# Patient Record
Sex: Male | Born: 1945 | Race: Black or African American | Hispanic: No | Marital: Single | Smoking: Former smoker
Health system: Southern US, Community
[De-identification: ages and names within clinical notes are randomized; demographics above are authoritative.]

## PROBLEM LIST (undated history)

## (undated) DIAGNOSIS — I2510 Atherosclerotic heart disease of native coronary artery without angina pectoris: Secondary | ICD-10-CM

## (undated) DIAGNOSIS — E785 Hyperlipidemia, unspecified: Secondary | ICD-10-CM

## (undated) DIAGNOSIS — C61 Malignant neoplasm of prostate: Secondary | ICD-10-CM

## (undated) DIAGNOSIS — D649 Anemia, unspecified: Secondary | ICD-10-CM

## (undated) DIAGNOSIS — I10 Essential (primary) hypertension: Secondary | ICD-10-CM

## (undated) DIAGNOSIS — I5042 Chronic combined systolic (congestive) and diastolic (congestive) heart failure: Secondary | ICD-10-CM

## (undated) DIAGNOSIS — R569 Unspecified convulsions: Secondary | ICD-10-CM

## (undated) DIAGNOSIS — C801 Malignant (primary) neoplasm, unspecified: Secondary | ICD-10-CM

## (undated) DIAGNOSIS — F0390 Unspecified dementia without behavioral disturbance: Secondary | ICD-10-CM

## (undated) HISTORY — PX: OTHER SURGICAL HISTORY: SHX169

---

## 2004-06-28 ENCOUNTER — Emergency Department: Payer: Self-pay | Admitting: Emergency Medicine

## 2007-03-03 ENCOUNTER — Other Ambulatory Visit: Payer: Self-pay

## 2007-03-03 ENCOUNTER — Emergency Department: Payer: Self-pay | Admitting: Emergency Medicine

## 2013-06-16 ENCOUNTER — Emergency Department: Payer: Self-pay | Admitting: Emergency Medicine

## 2013-06-16 LAB — BASIC METABOLIC PANEL
Anion Gap: 9 (ref 7–16)
BUN: 10 mg/dL (ref 7–18)
CALCIUM: 8.8 mg/dL (ref 8.5–10.1)
CHLORIDE: 108 mmol/L — AB (ref 98–107)
CREATININE: 1.1 mg/dL (ref 0.60–1.30)
Co2: 22 mmol/L (ref 21–32)
Glucose: 109 mg/dL — ABNORMAL HIGH (ref 65–99)
Osmolality: 277 (ref 275–301)
Potassium: 3.9 mmol/L (ref 3.5–5.1)
Sodium: 139 mmol/L (ref 136–145)

## 2013-06-16 LAB — CBC
HCT: 41.8 % (ref 40.0–52.0)
HGB: 13.5 g/dL (ref 13.0–18.0)
MCH: 28.9 pg (ref 26.0–34.0)
MCHC: 32.4 g/dL (ref 32.0–36.0)
MCV: 90 fL (ref 80–100)
Platelet: 167 10*3/uL (ref 150–440)
RBC: 4.67 10*6/uL (ref 4.40–5.90)
RDW: 16.5 % — AB (ref 11.5–14.5)
WBC: 4.8 10*3/uL (ref 3.8–10.6)

## 2013-06-16 LAB — ETHANOL
Ethanol %: <0.003 % (ref 0.000–0.080)
Ethanol: <3 mg/dL

## 2013-06-29 ENCOUNTER — Emergency Department: Payer: Self-pay | Admitting: Emergency Medicine

## 2013-06-29 LAB — URINALYSIS, COMPLETE
BILIRUBIN, UR: NEGATIVE
Bacteria: NONE SEEN
Glucose,UR: NEGATIVE mg/dL (ref 0–75)
Ketone: NEGATIVE
Leukocyte Esterase: NEGATIVE
NITRITE: NEGATIVE
PROTEIN: NEGATIVE
Ph: 5 (ref 4.5–8.0)
Specific Gravity: 1.01 (ref 1.003–1.030)
Squamous Epithelial: <1
WBC UR: 1 /HPF (ref 0–5)

## 2013-06-29 LAB — BASIC METABOLIC PANEL
ANION GAP: 6 — AB (ref 7–16)
BUN: 7 mg/dL (ref 7–18)
CREATININE: 1.06 mg/dL (ref 0.60–1.30)
Calcium, Total: 9.1 mg/dL (ref 8.5–10.1)
Chloride: 107 mmol/L (ref 98–107)
Co2: 28 mmol/L (ref 21–32)
EGFR (African American): >60
Glucose: 105 mg/dL — ABNORMAL HIGH (ref 65–99)
Osmolality: 280 (ref 275–301)
Potassium: 3.8 mmol/L (ref 3.5–5.1)
Sodium: 141 mmol/L (ref 136–145)

## 2013-06-29 LAB — CBC
HCT: 41.6 % (ref 40.0–52.0)
HGB: 13.7 g/dL (ref 13.0–18.0)
MCH: 29.3 pg (ref 26.0–34.0)
MCHC: 33 g/dL (ref 32.0–36.0)
MCV: 89 fL (ref 80–100)
PLATELETS: 254 10*3/uL (ref 150–440)
RBC: 4.68 10*6/uL (ref 4.40–5.90)
RDW: 16.1 % — AB (ref 11.5–14.5)
WBC: 10 10*3/uL (ref 3.8–10.6)

## 2013-06-30 LAB — DRUG SCREEN, URINE

## 2013-06-30 LAB — ETHANOL: Ethanol %: <0.003 % (ref 0.000–0.080)

## 2013-06-30 LAB — CK: CK, Total: 522 U/L — ABNORMAL HIGH

## 2013-06-30 LAB — TROPONIN I: Troponin-I: <0.02 ng/mL

## 2013-07-28 ENCOUNTER — Emergency Department: Payer: Self-pay | Admitting: Emergency Medicine

## 2013-07-28 LAB — URINALYSIS, COMPLETE
BILIRUBIN, UR: NEGATIVE
Bacteria: NONE SEEN
Blood: NEGATIVE
Glucose,UR: NEGATIVE mg/dL (ref 0–75)
Ketone: NEGATIVE
LEUKOCYTE ESTERASE: NEGATIVE
Nitrite: NEGATIVE
Ph: 5 (ref 4.5–8.0)
Protein: 30
Specific Gravity: 1.014 (ref 1.003–1.030)
Squamous Epithelial: NONE SEEN

## 2013-07-28 LAB — CBC WITH DIFFERENTIAL/PLATELET
Basophil #: 0 10*3/uL (ref 0.0–0.1)
Basophil %: 0.8 %
Eosinophil #: 0.1 10*3/uL (ref 0.0–0.7)
Eosinophil %: 2 %
HCT: 42.2 % (ref 40.0–52.0)
HGB: 14.2 g/dL (ref 13.0–18.0)
LYMPHS PCT: 25.1 %
Lymphocyte #: 1.3 10*3/uL (ref 1.0–3.6)
MCH: 30.1 pg (ref 26.0–34.0)
MCHC: 33.6 g/dL (ref 32.0–36.0)
MCV: 90 fL (ref 80–100)
Monocyte #: 0.5 x10 3/mm (ref 0.2–1.0)
Monocyte %: 9 %
NEUTROS PCT: 63.1 %
Neutrophil #: 3.2 10*3/uL (ref 1.4–6.5)
Platelet: 192 10*3/uL (ref 150–440)
RBC: 4.71 10*6/uL (ref 4.40–5.90)
RDW: 16.5 % — AB (ref 11.5–14.5)
WBC: 5.1 10*3/uL (ref 3.8–10.6)

## 2013-07-28 LAB — BASIC METABOLIC PANEL
Anion Gap: 6 — ABNORMAL LOW (ref 7–16)
BUN: 14 mg/dL (ref 7–18)
CREATININE: 1.05 mg/dL (ref 0.60–1.30)
Calcium, Total: 9.1 mg/dL (ref 8.5–10.1)
Chloride: 109 mmol/L — ABNORMAL HIGH (ref 98–107)
Co2: 25 mmol/L (ref 21–32)
EGFR (Non-African Amer.): >60
Glucose: 98 mg/dL (ref 65–99)
OSMOLALITY: 280 (ref 275–301)
Potassium: 3.9 mmol/L (ref 3.5–5.1)
Sodium: 140 mmol/L (ref 136–145)

## 2013-07-28 LAB — TROPONIN I

## 2013-07-28 LAB — PHENYTOIN LEVEL, TOTAL: Dilantin: 9.6 ug/mL — ABNORMAL LOW (ref 10.0–20.0)

## 2013-08-14 ENCOUNTER — Emergency Department: Payer: Self-pay | Admitting: Emergency Medicine

## 2013-08-14 LAB — URINALYSIS, COMPLETE
Bacteria: NONE SEEN
Bilirubin,UR: NEGATIVE
GLUCOSE, UR: NEGATIVE mg/dL (ref 0–75)
KETONE: NEGATIVE
Leukocyte Esterase: NEGATIVE
NITRITE: NEGATIVE
Ph: 5 (ref 4.5–8.0)
SPECIFIC GRAVITY: 1.01 (ref 1.003–1.030)
Squamous Epithelial: NONE SEEN

## 2013-08-14 LAB — CBC
HCT: 39.4 % — ABNORMAL LOW (ref 40.0–52.0)
HGB: 12.9 g/dL — ABNORMAL LOW (ref 13.0–18.0)
MCH: 29.9 pg (ref 26.0–34.0)
MCHC: 32.7 g/dL (ref 32.0–36.0)
MCV: 91 fL (ref 80–100)
Platelet: 180 10*3/uL (ref 150–440)
RBC: 4.32 10*6/uL — ABNORMAL LOW (ref 4.40–5.90)
RDW: 15.8 % — ABNORMAL HIGH (ref 11.5–14.5)
WBC: 5 10*3/uL (ref 3.8–10.6)

## 2013-08-14 LAB — BASIC METABOLIC PANEL
Anion Gap: 9 (ref 7–16)
BUN: 10 mg/dL (ref 7–18)
CALCIUM: 8.2 mg/dL — AB (ref 8.5–10.1)
Chloride: 104 mmol/L (ref 98–107)
Co2: 24 mmol/L (ref 21–32)
Creatinine: 1.4 mg/dL — ABNORMAL HIGH (ref 0.60–1.30)
EGFR (Non-African Amer.): 52 — ABNORMAL LOW
GFR CALC AF AMER: 60 — AB
GLUCOSE: 110 mg/dL — AB (ref 65–99)
Osmolality: 274 (ref 275–301)
POTASSIUM: 3.8 mmol/L (ref 3.5–5.1)
Sodium: 137 mmol/L (ref 136–145)

## 2013-08-14 LAB — PHENYTOIN LEVEL, TOTAL: Dilantin: 1.6 ug/mL — ABNORMAL LOW (ref 10.0–20.0)

## 2013-08-15 LAB — DRUG SCREEN, URINE

## 2013-08-19 ENCOUNTER — Emergency Department: Payer: Self-pay | Admitting: Emergency Medicine

## 2013-08-29 DIAGNOSIS — R200 Anesthesia of skin: Secondary | ICD-10-CM | POA: Insufficient documentation

## 2013-08-29 DIAGNOSIS — G44229 Chronic tension-type headache, not intractable: Secondary | ICD-10-CM | POA: Insufficient documentation

## 2013-08-29 DIAGNOSIS — R413 Other amnesia: Secondary | ICD-10-CM | POA: Insufficient documentation

## 2013-12-19 DIAGNOSIS — F1490 Cocaine use, unspecified, uncomplicated: Secondary | ICD-10-CM | POA: Insufficient documentation

## 2014-04-04 ENCOUNTER — Emergency Department: Payer: Self-pay | Admitting: Emergency Medicine

## 2014-04-04 DIAGNOSIS — F29 Unspecified psychosis not due to a substance or known physiological condition: Secondary | ICD-10-CM | POA: Diagnosis not present

## 2014-04-04 DIAGNOSIS — F05 Delirium due to known physiological condition: Secondary | ICD-10-CM | POA: Diagnosis not present

## 2014-04-04 DIAGNOSIS — G3184 Mild cognitive impairment, so stated: Secondary | ICD-10-CM | POA: Diagnosis not present

## 2014-04-04 DIAGNOSIS — F419 Anxiety disorder, unspecified: Secondary | ICD-10-CM | POA: Diagnosis not present

## 2014-04-04 DIAGNOSIS — Z79899 Other long term (current) drug therapy: Secondary | ICD-10-CM | POA: Diagnosis not present

## 2014-04-04 DIAGNOSIS — F23 Brief psychotic disorder: Secondary | ICD-10-CM | POA: Diagnosis not present

## 2014-04-04 DIAGNOSIS — S90511A Abrasion, right ankle, initial encounter: Secondary | ICD-10-CM | POA: Diagnosis not present

## 2014-04-12 DIAGNOSIS — F1490 Cocaine use, unspecified, uncomplicated: Secondary | ICD-10-CM | POA: Diagnosis not present

## 2014-04-12 DIAGNOSIS — R569 Unspecified convulsions: Secondary | ICD-10-CM | POA: Diagnosis not present

## 2014-04-12 DIAGNOSIS — R51 Headache: Secondary | ICD-10-CM | POA: Diagnosis not present

## 2014-04-12 DIAGNOSIS — R200 Anesthesia of skin: Secondary | ICD-10-CM | POA: Diagnosis not present

## 2014-06-18 NOTE — Consult Note (Signed)
Psychiatry: On follow-up evaluation the patient is now consistently awake.  His brother visited earlier today and reports that according to nursing this is his baseline mental state.  Family had been concerned about some substance abuse issues.  Patient remains confused.  No evidence of suicidality or homicidality or psychosis.  Most likely suffering long-term cognitive damage from substance abuse or the postictal effects from a seizure.  I have ordered a level of his Keppra which is not back yet.  Restarted his Keppra and discussed with the patient the importance of med compliance.  Patient no longer meets commitment criteria and can be released from the emergency room.  follow with Dr. Doy Hutching.  Electronic Signatures: Tommi Crepeau, Madie Reno (MD)  (Signed on 16-Feb-16 18:33)  Authored  Last Updated: 16-Feb-16 18:33 by Gonzella Lex (MD)

## 2014-06-18 NOTE — Consult Note (Signed)
PATIENT NAME:  Jeremy [name], Jeremy [name] MR#:  [id number] DATE OF BIRTH:  1945/02/25  DATE OF CONSULTATION:  04/04/2014  REFERRING PHYSICIAN:   CONSULTING PHYSICIAN:  Gonzella Lex, MD  IDENTIFYING INFORMATION AND REASON FOR CONSULTATION: A 69 year old man with apparently a past history of seizures who was brought in after being picked up wandering, acting confused, and bizarre in public. The patient has no real chief complaint.   HISTORY OF PRESENT ILLNESS: Information obtained from the patient and the chart. We are told that the patient was found wandering outside and appeared to be confused and incoherent. The psychiatry nursing assessment found him to be rather uncommunicative and could not add much more, thought that he was nodding back and forth and not responding much. We are told that there has been some communication with his brother who said that he was looking for him, but I have not been able to get any clear information than that. The patient is still a very poor historian. He mentions walking outside, but tells me he thinks he was just walking for a couple of hours. He knows that he is supposed to be taking medication. It is unclear whether he has been compliant with it recently. The patient is not reporting any acute mood symptoms. He is not reporting any hallucinations, not reporting any suicidal or homicidal ideation. He is a little hard to pin down about drinking. He mentions at one point that he had had part or most of a 40 ounce beer, but sounds like it had been at least days ago. Denies that he is abusing other drugs currently, although I am unclear as to whether he was answering positively about marijuana use.   PAST PSYCHIATRIC HISTORY: We are still tracking down information. I left a message for his brother to call me back. We have his medicines from his pharmacy, which are only Keppra. We have documentation that he came to the Emergency Room several times earlier in 2015, either for  seizures or for medication refill. No known mental health history. The patient denies ever having been in a psychiatric hospital or having any history of suicidality.   FAMILY HISTORY: He mentions that there is a positive family history of mental illness.   SOCIAL HISTORY: Evidently lives alone, but says that his brother lives very close by, maybe only 1 or 2 houses away. The patient says that he is not married and that he has no children. Cannot give much more information.   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   CURRENT MEDICATIONS: Keppra 250 mg in the morning, 500 mg at night.   ALLERGIES: No known drug allergies.   REVIEW OF SYSTEMS: The patient is not complaining of any specific physical concerns right now. Does not complain of pain or feeling sick. Denies having any hallucinations. Denies suicidality.   MENTAL STATUS EXAMINATION: A somewhat disheveled, thin for his height gentleman who looks his stated age. He is cooperative as much as he can be with his confused mental state. Eye contact intermittent. Psychomotor activity marked by slowness and being a little off balance and seeming confused. He had a great deal of trouble getting his trousers to stay up and was often preoccupied with that. Speech was decreased in total amount, slightly hard to understand, slow. Affect blunted. Mood stated as being okay. Thoughts are disorganized. He can start to answer some questions and then will veer off  on a tangent that does not seem to make much sense. On other occasions he does not even start to give an appropriate answer to questions. Seems to be occasionally responding to internal stimuli, but not necessarily to voices. Denies auditory or visual hallucinations currently, but he seems to indicate that he may have seen some odd things or heard some odd things in the recent past. Denies suicidal or homicidal ideation. The patient can  repeat 3 words immediately, remembers zero of them at three minutes. He is oriented to the month, but not the year. He was eventually able to guess that he is in [place], but otherwise disoriented to place and situation. Cognitive function appears to be impaired. Judgment and insight presumably impaired.   LABORATORY RESULTS: Drug screen positive for benzodiazepines. After investigating it that appears to be an artifact of him being given medication when he came in to the Emergency Room. TSH normal. Alcohol level negative. Chemistry slightly low potassium 3.1, slightly elevated glucose 139, everything else unremarkable. Hematology panel unremarkable. Salicylates and acetaminophen negative.   VITAL SIGNS: His blood pressure is running high with the most recent one being 143/115, respirations 18, pulse 104, temperature 97.8.   ASSESSMENT: This is a 69 year old man who is apparently confused and agitated when he was first brought into the Emergency Room. He was rapidly given forced medications which probably kept him sedated for a little while, but now he seems to be coming out of that. He appears to be confused and probably having some delirium right now. It is not clear if this is on top of dementia or not. It is not clear at this point what it could be from. Certainly could be from recent seizures, especially partial seizures, which could explain odd behavior when he came in. No evidence here that I can see of acute substance abuse. Cannot rule out delirium tremens, but do not have any definite proof of that. The patient needs further evaluation to determine appropriate treatment.   TREATMENT PLAN: I have ordered a Keppra level to be drawn stat just to see if we can find out whether he might have been taking his medication appropriately. I have put in a phone call to his brother to get collateral information. Once we get the [place] level drawn we can give him his usual dose of his medication. At this  point, no indication for psychiatric hospitalization.   DIAGNOSIS, PRINCIPAL AND PRIMARY:  AXIS I: Delirium, unknown etiology.   SECONDARY DIAGNOSES: AXIS I: Rule out dementia.  AXIS II: No diagnosis.  AXIS III: Seizure disorder.  ____________________________ Gonzella Lex, MD jtc:sb D: 04/04/2014 10:25:55 ET T: 04/04/2014 10:42:35 ET JOB#: [id number]  cc: Gonzella Lex, MD, <Dictator> Gonzella Lex MD ELECTRONICALLY SIGNED 04/25/2014 10:33

## 2014-12-30 ENCOUNTER — Emergency Department: Payer: Commercial Managed Care - HMO

## 2014-12-30 ENCOUNTER — Emergency Department
Admission: EM | Admit: 2014-12-30 | Discharge: 2014-12-30 | Disposition: A | Discharge status: Home / Self Care | Payer: Commercial Managed Care - HMO | Attending: Emergency Medicine | Admitting: Emergency Medicine

## 2014-12-30 ENCOUNTER — Encounter: Payer: Self-pay | Admitting: Emergency Medicine

## 2014-12-30 DIAGNOSIS — Z87891 Personal history of nicotine dependence: Secondary | ICD-10-CM | POA: Diagnosis not present

## 2014-12-30 DIAGNOSIS — Z79899 Other long term (current) drug therapy: Secondary | ICD-10-CM | POA: Diagnosis not present

## 2014-12-30 DIAGNOSIS — R030 Elevated blood-pressure reading, without diagnosis of hypertension: Secondary | ICD-10-CM | POA: Diagnosis present

## 2014-12-30 DIAGNOSIS — I10 Essential (primary) hypertension: Secondary | ICD-10-CM | POA: Diagnosis not present

## 2014-12-30 DIAGNOSIS — R202 Paresthesia of skin: Secondary | ICD-10-CM | POA: Insufficient documentation

## 2014-12-30 HISTORY — DX: Unspecified convulsions: R56.9

## 2014-12-30 LAB — COMPREHENSIVE METABOLIC PANEL
ALBUMIN: 4.4 g/dL (ref 3.5–5.0)
ALT: 8 U/L — AB (ref 17–63)
AST: 19 U/L (ref 15–41)
Alkaline Phosphatase: 50 U/L (ref 38–126)
Anion gap: 7 (ref 5–15)
BUN: 11 mg/dL (ref 6–20)
CHLORIDE: 105 mmol/L (ref 101–111)
CO2: 26 mmol/L (ref 22–32)
Calcium: 9.3 mg/dL (ref 8.9–10.3)
Creatinine, Ser: 1.02 mg/dL (ref 0.61–1.24)
GFR calc Af Amer: >60 mL/min (ref >60)
GLUCOSE: 90 mg/dL (ref 65–99)
POTASSIUM: 3.7 mmol/L (ref 3.5–5.1)
Sodium: 138 mmol/L (ref 135–145)
Total Bilirubin: 0.3 mg/dL (ref 0.3–1.2)
Total Protein: 7.9 g/dL (ref 6.5–8.1)

## 2014-12-30 LAB — CBC
HEMATOCRIT: 39.1 % — AB (ref 40.0–52.0)
Hemoglobin: 13.3 g/dL (ref 13.0–18.0)
MCH: 30.4 pg (ref 26.0–34.0)
MCHC: 34 g/dL (ref 32.0–36.0)
MCV: 89.5 fL (ref 80.0–100.0)
PLATELETS: 199 10*3/uL (ref 150–440)
RBC: 4.37 MIL/uL — AB (ref 4.40–5.90)
RDW: 14.5 % (ref 11.5–14.5)
WBC: 5 10*3/uL (ref 3.8–10.6)

## 2014-12-30 LAB — TROPONIN I: Troponin I: <0.03 ng/mL (ref <0.031)

## 2014-12-30 NOTE — ED Provider Notes (Addendum)
[facility] Emergency Department Provider Note  Time seen: 7:09 PM  I have reviewed the triage vital signs and the nursing notes.   HISTORY  Chief Complaint Hypertension    HPI Jeremy [name] is a 69 y.o. male with a past medical history of a seizure disorder presents the emergency department elevated blood pressure and intermittent right arm and leg numbness. According to the patient for the past 2 weeks he has had intermittent numbness and tingling in his right arm and leg. He states it'll resolve completely at times and sometimes for several days. He states when the numbness tingling occurs it occurs for seconds or minutes and then resolves. States he's never had issues like this in the past. He has a seizure disorder for which he takes Keppra and follows up with Dr. Gurney Maxin. Denies any headache, confusion, slurred speech. Denies any weakness or trouble walking. Denies any current numbness or tingling.     Past Medical History  Diagnosis Date  . Seizures ([place])     There are no active problems to display for this patient.   History reviewed. No pertinent past surgical history.  Current Outpatient Rx  Name  Route  Sig  Dispense  Refill  . levETIRAcetam (KEPPRA) 500 MG tablet   Oral   Take 500 mg by mouth every morning.         . levETIRAcetam (KEPPRA) 750 MG tablet   Oral   Take 750 mg by mouth once.           Allergies Review of patient's allergies indicates no known allergies.  History reviewed. No pertinent family history.  Social History Social History  Substance Use Topics  . Smoking status: Former Smoker -- 25 years  . Smokeless tobacco: None  . Alcohol Use: No     Comment: quit etoh x 3 months ago    Review of Systems Constitutional: Negative for fever. Cardiovascular: Negative for chest pain. Respiratory: Negative for shortness of breath. Gastrointestinal: Negative for abdominal pain Musculoskeletal:  Negative for back pain. Neurological: Occasional tingling and numbness in the right arm and leg. Denies headache. 10-point ROS otherwise negative.  ____________________________________________   PHYSICAL EXAM:  VITAL SIGNS: ED Triage Vitals  Enc Vitals Group     BP 12/30/14 1810 193/85 mmHg     Pulse Rate 12/30/14 1810 54     Resp 12/30/14 1810 18     Temp 12/30/14 1810 98.2 F (36.8 C)     Temp src --      SpO2 12/30/14 1810 98 %     Weight 12/30/14 1810 155 lb (70.308 kg)     Height 12/30/14 1810 6' (1.829 m)     Head Cir --      Peak Flow --      Pain Score --      Pain Loc --      Pain Edu? --      Excl. in [place]? --    Constitutional: Alert and oriented. Well appearing and in no distress. Eyes: Normal exam ENT   Head: Normocephalic and atraumatic.   Mouth/Throat: Mucous membranes are moist. Cardiovascular: Normal rate, regular rhythm. No murmur Respiratory: Normal respiratory effort without tachypnea nor retractions. Breath sounds are clear and equal bilaterally. No wheezes/rales/rhonchi. Gastrointestinal: Soft and nontender. No distention.  Musculoskeletal: Nontender with normal range of motion in all extremities.  Neurologic:  Normal speech and language. No gross focal neurologic deficits. Equal grip strengths bilaterally. No cranial nerve  deficits identified. No pronator drift on exam. 2 mm PERRL eye exam. Skin:  Skin is warm, dry and intact.  Psychiatric: Mood and affect are normal. Speech and behavior are normal.  ____________________________________________    EKG  EKG reviewed and interpreted by myself shows sinus bradycardia at 52 bpm, narrow QRS, normal axis, normal intervals, no ST changes noted. Overall normal EKG.  ____________________________________________    RADIOLOGY  CT head shows no acute abnormality.  ____________________________________________    INITIAL IMPRESSION / ASSESSMENT AND PLAN / ED COURSE  Pertinent labs & imaging  results that were available during my care of the patient were reviewed by me and considered in my medical decision making (see chart for details).  CT negative. Labs are largely within normal limits. Patient has a good neurologic exam currently. No deficits identified. Patient states symptoms are intermittent and when they do occur they occur for seconds to minutes and then resolved. Sounds inconsistent with CVA. The patient sees Dr. Melrose Nakayama for seizure disorder. I recommended that the patient follow-up with Dr. Melrose Nakayama for this issue to consider further imaging such as an MRI. Patient is agreeable mode discharged home.  CT shows no acute changes. Labs are within normal limits. I discussed with the patient follow-up with his neurologist for further workup here today also discussed strict CVA return precautions, patient is agreeable.  ____________________________________________   FINAL CLINICAL IMPRESSION(S) / ED DIAGNOSES  Paresthesias   Harvest Dark, MD 12/30/14 1912  Harvest Dark, MD 12/30/14 2000

## 2014-12-30 NOTE — Discharge Instructions (Signed)
Please call Dr. Melrose Nakayama at the number above to arrange a follow-up appointment as soon as possible to discuss her symptoms further. Return to the emergency department for any worsening weakness or numbness symptoms, headache, confusion or slurred speech.     Paresthesia Paresthesia is a burning or prickling feeling. This feeling can happen in any part of the body. It often happens in the hands, arms, legs, or feet. Usually, it is not painful. In most cases, the feeling goes away in a short time and is not a sign of a serious problem. HOME CARE  Avoid drinking alcohol.  Try massage or needle therapy (acupuncture) to help with your problems.  Keep all follow-up visits as told by your doctor. This is important. GET HELP IF:  You keep on having episodes of paresthesia.  Your burning or prickling feeling gets worse when you walk.  You have pain or cramps.  You feel dizzy.  You have a rash. GET HELP RIGHT AWAY IF:  You feel weak.  You have trouble walking or moving.  You have problems speaking, understanding, or seeing.  You feel confused.  You cannot control when you pee (urinate) or poop (bowel movement).  You lose feeling (numbness) after an injury.  You pass out (faint).   This information is not intended to replace advice given to you by your health care provider. Make sure you discuss any questions you have with your health care provider.   Document Released: 01/17/2008 Document Revised: 06/20/2014 Document Reviewed: 01/30/2014 Elsevier Interactive Patient Education 2016 [employer].  Hypertension Hypertension, commonly called high blood pressure, is when the force of blood pumping through your arteries is too strong. Your arteries are the blood vessels that carry blood from your heart throughout your body. A blood pressure reading consists of a higher number over a lower number, such as 110/72. The higher number (systolic) is the pressure inside your arteries when  your heart pumps. The lower number (diastolic) is the pressure inside your arteries when your heart relaxes. Ideally you want your blood pressure below 120/80. Hypertension forces your heart to work harder to pump blood. Your arteries may become narrow or stiff. Having untreated or uncontrolled hypertension can cause heart attack, stroke, kidney disease, and other problems. RISK FACTORS Some risk factors for high blood pressure are controllable. Others are not.  Risk factors you cannot control include:   Race. You may be at higher risk if you are African American.  Age. Risk increases with age.  Gender. Men are at higher risk than women before age 66 years. After age 34, women are at higher risk than men. Risk factors you can control include:  Not getting enough exercise or physical activity.  Being overweight.  Getting too much fat, sugar, calories, or salt in your diet.  Drinking too much alcohol. SIGNS AND SYMPTOMS Hypertension does not usually cause signs or symptoms. Extremely high blood pressure (hypertensive crisis) may cause headache, anxiety, shortness of breath, and nosebleed. DIAGNOSIS To check if you have hypertension, your health care provider will measure your blood pressure while you are seated, with your arm held at the level of your heart. It should be measured at least twice using the same arm. Certain conditions can cause a difference in blood pressure between your right and left arms. A blood pressure reading that is higher than normal on one occasion does not mean that you need treatment. If it is not clear whether you have high blood pressure, you may be  asked to return on a different day to have your blood pressure checked again. Or, you may be asked to monitor your blood pressure at home for 1 or more weeks. TREATMENT Treating high blood pressure includes making lifestyle changes and possibly taking medicine. Living a healthy lifestyle can help lower high blood  pressure. You may need to change some of your habits. Lifestyle changes may include:  Following the DASH diet. This diet is high in fruits, vegetables, and whole grains. It is low in salt, red meat, and added sugars.  Keep your sodium intake below 2,300 mg per day.  Getting at least 30-45 minutes of aerobic exercise at least 4 times per week.  Losing weight if necessary.  Not smoking.  Limiting alcoholic beverages.  Learning ways to reduce stress. Your health care provider may prescribe medicine if lifestyle changes are not enough to get your blood pressure under control, and if one of the following is true:  You are 32-15 years of age and your systolic blood pressure is above 140.  You are 56 years of age or older, and your systolic blood pressure is above 150.  Your diastolic blood pressure is above 90.  You have diabetes, and your systolic blood pressure is over [id number] or your diastolic blood pressure is over 90.  You have kidney disease and your blood pressure is above 140/90.  You have heart disease and your blood pressure is above 140/90. Your personal target blood pressure may vary depending on your medical conditions, your age, and other factors. HOME CARE INSTRUCTIONS  Have your blood pressure rechecked as directed by your health care provider.   Take medicines only as directed by your health care provider. Follow the directions carefully. Blood pressure medicines must be taken as prescribed. The medicine does not work as well when you skip doses. Skipping doses also puts you at risk for problems.  Do not smoke.   Monitor your blood pressure at home as directed by your health care provider. SEEK MEDICAL CARE IF:   You think you are having a reaction to medicines taken.  You have recurrent headaches or feel dizzy.  You have swelling in your ankles.  You have trouble with your vision. SEEK IMMEDIATE MEDICAL CARE IF:  You develop a severe headache or  confusion.  You have unusual weakness, numbness, or feel faint.  You have severe chest or abdominal pain.  You vomit repeatedly.  You have trouble breathing. MAKE SURE YOU:   Understand these instructions.  Will watch your condition.  Will get help right away if you are not doing well or get worse.   This information is not intended to replace advice given to you by your health care provider. Make sure you discuss any questions you have with your health care provider.   Document Released: 02/03/2005 Document Revised: 06/20/2014 Document Reviewed: 11/26/2012 Elsevier Interactive Patient Education [employer].

## 2014-12-30 NOTE — ED Notes (Signed)
Pt states rt arm and leg numbness off and on x 1 week specifically in bicep, forearm and calf. Denies hx of htn

## 2015-01-13 ENCOUNTER — Emergency Department
Admission: EM | Admit: 2015-01-13 | Discharge: 2015-01-13 | Disposition: A | Discharge status: Home / Self Care | Payer: Commercial Managed Care - HMO | Attending: Emergency Medicine | Admitting: Emergency Medicine

## 2015-01-13 ENCOUNTER — Emergency Department: Payer: Commercial Managed Care - HMO

## 2015-01-13 DIAGNOSIS — R202 Paresthesia of skin: Secondary | ICD-10-CM | POA: Diagnosis not present

## 2015-01-13 DIAGNOSIS — Z79899 Other long term (current) drug therapy: Secondary | ICD-10-CM | POA: Insufficient documentation

## 2015-01-13 DIAGNOSIS — Z87891 Personal history of nicotine dependence: Secondary | ICD-10-CM | POA: Insufficient documentation

## 2015-01-13 DIAGNOSIS — I10 Essential (primary) hypertension: Secondary | ICD-10-CM | POA: Diagnosis not present

## 2015-01-13 DIAGNOSIS — R55 Syncope and collapse: Secondary | ICD-10-CM | POA: Diagnosis not present

## 2015-01-13 DIAGNOSIS — Z743 Need for continuous supervision: Secondary | ICD-10-CM | POA: Diagnosis not present

## 2015-01-13 DIAGNOSIS — R079 Chest pain, unspecified: Secondary | ICD-10-CM

## 2015-01-13 DIAGNOSIS — R531 Weakness: Secondary | ICD-10-CM | POA: Diagnosis not present

## 2015-01-13 DIAGNOSIS — R7989 Other specified abnormal findings of blood chemistry: Secondary | ICD-10-CM | POA: Diagnosis not present

## 2015-01-13 HISTORY — DX: Essential (primary) hypertension: I10

## 2015-01-13 LAB — TROPONIN I
Troponin I: <0.03 ng/mL (ref <0.031)
Troponin I: <0.03 ng/mL (ref <0.031)

## 2015-01-13 LAB — COMPREHENSIVE METABOLIC PANEL
ALK PHOS: 52 U/L (ref 38–126)
ALT: 12 U/L — AB (ref 17–63)
AST: 21 U/L (ref 15–41)
Albumin: 4.6 g/dL (ref 3.5–5.0)
Anion gap: 8 (ref 5–15)
BUN: 12 mg/dL (ref 6–20)
CALCIUM: 9.2 mg/dL (ref 8.9–10.3)
CO2: 24 mmol/L (ref 22–32)
CREATININE: 1.16 mg/dL (ref 0.61–1.24)
Chloride: 104 mmol/L (ref 101–111)
GFR calc non Af Amer: >60 mL/min (ref >60)
Glucose, Bld: 95 mg/dL (ref 65–99)
Potassium: 3.7 mmol/L (ref 3.5–5.1)
SODIUM: 136 mmol/L (ref 135–145)
Total Bilirubin: 0.9 mg/dL (ref 0.3–1.2)
Total Protein: 8.1 g/dL (ref 6.5–8.1)

## 2015-01-13 LAB — CBC
HCT: 41.4 % (ref 40.0–52.0)
HEMOGLOBIN: 13.7 g/dL (ref 13.0–18.0)
MCH: 29.8 pg (ref 26.0–34.0)
MCHC: 33.1 g/dL (ref 32.0–36.0)
MCV: 90 fL (ref 80.0–100.0)
Platelets: 203 10*3/uL (ref 150–440)
RBC: 4.6 MIL/uL (ref 4.40–5.90)
RDW: 14.3 % (ref 11.5–14.5)
WBC: 8.4 10*3/uL (ref 3.8–10.6)

## 2015-01-13 LAB — FIBRIN DERIVATIVES D-DIMER (ARMC ONLY): FIBRIN DERIVATIVES D-DIMER (ARMC): 612 — AB (ref 0–499)

## 2015-01-13 MED ORDER — IOHEXOL 350 MG/ML SOLN
75.0000 mL | Freq: Once | INTRAVENOUS | Status: AC | PRN
  Administered 2015-01-13: 75 mL via INTRAVENOUS

## 2015-01-13 NOTE — Discharge Instructions (Signed)
You have been seen in the emergency department today for chest pain and possibly passing out. Your workup has shown normal results. As we discussed please follow-up with your primary care physician in the next 1-2 days for recheck. Return to the emergency department for any further chest pain, trouble breathing, or any other symptom personally concerning to yourself. Please follow-up with cardiology by calling the number provided as soon as possible to discuss further workup such as a stress test and Holter monitor.  Nonspecific Chest Pain It is often hard to find the cause of chest pain. There is always a chance that your pain could be related to something serious, such as a heart attack or a blood clot in your lungs. Chest pain can also be caused by conditions that are not life-threatening. If you have chest pain, it is very important to follow up with your doctor.  HOME CARE  If you were prescribed an antibiotic medicine, finish it all even if you start to feel better.  Avoid any activities that cause chest pain.  Do not use any tobacco products, including cigarettes, chewing tobacco, or electronic cigarettes. If you need help quitting, ask your doctor.  Do not drink alcohol.  Take medicines only as told by your doctor.  Keep all follow-up visits as told by your doctor. This is important. This includes any further testing if your chest pain does not go away.  Your doctor may tell you to keep your head raised (elevated) while you sleep.  Make lifestyle changes as told by your doctor. These may include:  Getting regular exercise. Ask your doctor to suggest some activities that are safe for you.  Eating a heart-healthy diet. Your doctor or a diet specialist (dietitian) can help you to learn healthy eating options.  Maintaining a healthy weight.  Managing diabetes, if necessary.  Reducing stress. GET HELP IF:  Your chest pain does not go away, even after treatment.  You have a rash  with blisters on your chest.  You have a fever. GET HELP RIGHT AWAY IF:  Your chest pain is worse.  You have an increasing cough, or you cough up blood.  You have severe belly (abdominal) pain.  You feel extremely weak.  You pass out (faint).  You have chills.  You have sudden, unexplained chest discomfort.  You have sudden, unexplained discomfort in your arms, back, neck, or jaw.  You have shortness of breath at any time.  You suddenly start to sweat, or your skin gets clammy.  You feel nauseous.  You vomit.  You suddenly feel light-headed or dizzy.  Your heart begins to beat quickly, or it feels like it is skipping beats. These symptoms may be an emergency. Do not wait to see if the symptoms will go away. Get medical help right away. Call your local emergency services (911 in the [place] Do not drive yourself to the hospital.   This information is not intended to replace advice given to you by your health care provider. Make sure you discuss any questions you have with your health care provider.   Document Released: 07/23/2007 Document Revised: 02/24/2014 Document Reviewed: 09/09/2013 Elsevier Interactive Patient Education 2016 [employer].  Syncope Syncope means a person passes out (faints). The person usually wakes up in less than 5 minutes. It is important to seek medical care for syncope. HOME CARE  Have someone stay with you until you feel normal.  Do not drive, use machines, or play sports until your doctor  says it is okay.  Keep all doctor visits as told.  Lie down when you feel like you might pass out. Take deep breaths. Wait until you feel normal before standing up.  Drink enough fluids to keep your pee (urine) clear or pale yellow.  If you take blood pressure or heart medicine, get up slowly. Take several minutes to sit and then stand. GET HELP RIGHT AWAY IF:   You have a severe headache.  You have pain in the chest, belly (abdomen), or  back.  You are bleeding from the mouth or butt (rectum).  You have black or tarry poop (stool).  You have an irregular or very fast heartbeat.  You have pain with breathing.  You keep passing out, or you have shaking (seizures) when you pass out.  You pass out when sitting or lying down.  You feel confused.  You have trouble walking.  You have severe weakness.  You have vision problems. If you fainted, call for help (911 in [place] Do not drive yourself to the hospital.   This information is not intended to replace advice given to you by your health care provider. Make sure you discuss any questions you have with your health care provider.   Document Released: 07/23/2007 Document Revised: 06/20/2014 Document Reviewed: 04/04/2011 Elsevier Interactive Patient Education [employer].

## 2015-01-13 NOTE — ED Notes (Signed)
Tingling to bilateral arms X 2 days. Pt has follow up with MD but did not want to wait for appt.

## 2015-01-13 NOTE — ED Notes (Signed)
Pt. Calling brother for a ride home.

## 2015-01-13 NOTE — ED Notes (Signed)
MD at bedside. 

## 2015-01-13 NOTE — ED Notes (Signed)
Patient transported to CT 

## 2015-01-13 NOTE — ED Notes (Signed)
Pt moved from hall to room 18 and placed on monitor. Iv, labs and ekg obtained prior. Pt has tingling in both x 2 -3 weeks. Was seen and dx with paresthesia and htn. Has appt in 2 weeks for the htn

## 2015-01-13 NOTE — ED Provider Notes (Signed)
[facility] Emergency Department Provider Note  Time seen: 3:59 PM  I have reviewed the triage vital signs and the nursing notes.   HISTORY  Chief Complaint Tingling    HPI Jeremy [name] is a 69 y.o. male with a past medical history of hypertension, seizure disorder, who presents the emergency department with chest pain and syncope. According to the patient he was seen here approximately 2 weeks ago with right-sided tingling, he states the tingling has persisted although it only is intermittent, lasting for several seconds at a time and then dissipates. His main concern today is he states for the past 2 days he has been having chest pain. Somewhat worse with exertion. Today he had a syncopal episode lasting approximately 30 minutes per patient. States he's only had one syncopal episode previously approximately 2 months ago. States continued mild chest pain currently. Denies shortness of breath. Describes the chest discomfort as a pressure/tightness feeling.     Past Medical History  Diagnosis Date  . Seizures ([place])     There are no active problems to display for this patient.   History reviewed. No pertinent past surgical history.  Current Outpatient Rx  Name  Route  Sig  Dispense  Refill  . levETIRAcetam (KEPPRA) 500 MG tablet   Oral   Take 500 mg by mouth every morning.         . levETIRAcetam (KEPPRA) 750 MG tablet   Oral   Take 750 mg by mouth once.           Allergies Review of patient's allergies indicates no known allergies.  No family history on file.  Social History Social History  Substance Use Topics  . Smoking status: Former Smoker -- 25 years  . Smokeless tobacco: None  . Alcohol Use: No     Comment: quit etoh x 3 months ago    Review of Systems Constitutional: Negative for fever. Cardiovascular: Mild chest pain Respiratory: Negative for shortness of breath. Gastrointestinal: Negative for abdominal pain,  vomiting and diarrhea. Genitourinary: Negative for dysuria. Musculoskeletal: Negative for back pain. Skin: Negative for rash. Neurological: Negative for headaches, focal weakness or numbness. Occasional tingling in right upper extremity ongoing 2 weeks.  10-point ROS otherwise negative.  ____________________________________________   PHYSICAL EXAM:  VITAL SIGNS: ED Triage Vitals  Enc Vitals Group     BP 01/13/15 1549 165/101 mmHg     Pulse Rate 01/13/15 1549 89     Resp 01/13/15 1549 18     Temp 01/13/15 1549 98.1 F (36.7 C)     Temp Source 01/13/15 1549 Oral     SpO2 01/13/15 1549 95 %     Weight 01/13/15 1549 155 lb (70.308 kg)     Height 01/13/15 1549 6' (1.829 m)     Head Cir --      Peak Flow --      Pain Score 01/13/15 1550 0     Pain Loc --      Pain Edu? --      Excl. in [place]? --     Constitutional: Alert and oriented. Well appearing and in no distress. Eyes: Normal exam ENT   Head: Normocephalic and atraumatic.Marland Kitchen   Mouth/Throat: Mucous membranes are moist. Cardiovascular: Normal rate, regular rhythm. No murmur Respiratory: Normal respiratory effort without tachypnea nor retractions. Breath sounds are clear  Gastrointestinal: Soft and nontender. No distention.   Musculoskeletal: Nontender with normal range of motion in all extremities. No lower extremity tenderness  or edema. Neurologic:  Normal speech and language. No gross focal neurologic deficits are appreciated. Speech is normal. Skin:  Skin is warm, dry and intact.  Psychiatric: Mood and affect are normal. Speech and behavior are normal.   ____________________________________________    EKG  EKG reviewed and interpreted by myself shows sinus rhythm at 91 bpm, narrow QRS, normal axis, largely normal intervals, multiple PVCs. Nonspecific ST changes.  ____________________________________________    RADIOLOGY  Chest x-ray negative  CT angiography is  negative.  ____________________________________________   INITIAL IMPRESSION / ASSESSMENT AND PLAN / ED COURSE  Pertinent labs & imaging results that were available during my care of the patient were reviewed by me and considered in my medical decision making (see chart for details).  Patient presents the emergency department complaints of chest pain 2 days, syncopal episode today. Also complains of continued paresthesias intermittently to right upper extremity 2 weeks. Patient states mild chest discomfort currently, denies any other symptoms at this time. No lower extremity edema or tenderness. Fairly normal exam.  D-dimer has resulted positive. We'll obtain a CT scan of the patient's chest to rule out PE.  CT angiography is negative besides a pulmonary nausea. I discussed results with the patient and discussed admission to the hospital. The patient would prefer to go home if possible. I discussed repeating a second troponin 3 hours after the first troponin, he is agreeable to this plan.  Second troponin is negative. We'll discharge the patient from the emergency department with urgent cardiology follow-up. The patient is agreeable.  ____________________________________________   FINAL CLINICAL IMPRESSION(S) / ED DIAGNOSES  Chest pain Syncope   Harvest Dark, MD 01/13/15 2037

## 2015-01-13 NOTE — ED Notes (Signed)
Troponin sent to lab.

## 2015-01-26 DIAGNOSIS — M47812 Spondylosis without myelopathy or radiculopathy, cervical region: Secondary | ICD-10-CM | POA: Diagnosis not present

## 2015-01-26 DIAGNOSIS — R569 Unspecified convulsions: Secondary | ICD-10-CM | POA: Diagnosis not present

## 2015-01-26 DIAGNOSIS — Z0000 Encounter for general adult medical examination without abnormal findings: Secondary | ICD-10-CM | POA: Diagnosis not present

## 2015-01-26 DIAGNOSIS — F1490 Cocaine use, unspecified, uncomplicated: Secondary | ICD-10-CM | POA: Diagnosis not present

## 2015-01-26 DIAGNOSIS — R55 Syncope and collapse: Secondary | ICD-10-CM | POA: Diagnosis not present

## 2015-01-26 DIAGNOSIS — R202 Paresthesia of skin: Secondary | ICD-10-CM | POA: Diagnosis not present

## 2015-01-26 DIAGNOSIS — R0789 Other chest pain: Secondary | ICD-10-CM | POA: Diagnosis not present

## 2015-01-26 DIAGNOSIS — Z79899 Other long term (current) drug therapy: Secondary | ICD-10-CM | POA: Diagnosis not present

## 2015-01-26 DIAGNOSIS — Z13220 Encounter for screening for lipoid disorders: Secondary | ICD-10-CM | POA: Diagnosis not present

## 2015-02-15 ENCOUNTER — Emergency Department: Payer: Commercial Managed Care - HMO

## 2015-02-15 ENCOUNTER — Emergency Department
Admission: EM | Admit: 2015-02-15 | Discharge: 2015-02-15 | Discharge status: Against Medical Advice | Payer: Commercial Managed Care - HMO | Attending: Internal Medicine | Admitting: Internal Medicine

## 2015-02-15 ENCOUNTER — Encounter: Payer: Self-pay | Admitting: *Deleted

## 2015-02-15 DIAGNOSIS — H538 Other visual disturbances: Secondary | ICD-10-CM | POA: Diagnosis not present

## 2015-02-15 DIAGNOSIS — G9340 Encephalopathy, unspecified: Secondary | ICD-10-CM | POA: Diagnosis not present

## 2015-02-15 DIAGNOSIS — Z79899 Other long term (current) drug therapy: Secondary | ICD-10-CM | POA: Diagnosis not present

## 2015-02-15 DIAGNOSIS — Z87891 Personal history of nicotine dependence: Secondary | ICD-10-CM | POA: Diagnosis not present

## 2015-02-15 DIAGNOSIS — G459 Transient cerebral ischemic attack, unspecified: Secondary | ICD-10-CM

## 2015-02-15 DIAGNOSIS — R4182 Altered mental status, unspecified: Secondary | ICD-10-CM | POA: Diagnosis not present

## 2015-02-15 DIAGNOSIS — I10 Essential (primary) hypertension: Secondary | ICD-10-CM | POA: Diagnosis not present

## 2015-02-15 DIAGNOSIS — R42 Dizziness and giddiness: Secondary | ICD-10-CM | POA: Diagnosis not present

## 2015-02-15 LAB — CBC
HEMATOCRIT: 39.1 % — AB (ref 40.0–52.0)
HEMOGLOBIN: 13.2 g/dL (ref 13.0–18.0)
MCH: 29.5 pg (ref 26.0–34.0)
MCHC: 33.8 g/dL (ref 32.0–36.0)
MCV: 87.3 fL (ref 80.0–100.0)
PLATELETS: 222 10*3/uL (ref 150–440)
RBC: 4.48 MIL/uL (ref 4.40–5.90)
RDW: 13.6 % (ref 11.5–14.5)
WBC: 4 10*3/uL (ref 3.8–10.6)

## 2015-02-15 LAB — URINALYSIS COMPLETE WITH MICROSCOPIC (ARMC ONLY)
BILIRUBIN URINE: NEGATIVE
GLUCOSE, UA: NEGATIVE mg/dL
Ketones, ur: NEGATIVE mg/dL
LEUKOCYTES UA: NEGATIVE
Nitrite: NEGATIVE
PH: 5 (ref 5.0–8.0)
Protein, ur: 100 mg/dL — AB
SPECIFIC GRAVITY, URINE: 1.015 (ref 1.005–1.030)

## 2015-02-15 LAB — BASIC METABOLIC PANEL
ANION GAP: 8 (ref 5–15)
BUN: 10 mg/dL (ref 6–20)
CO2: 24 mmol/L (ref 22–32)
Calcium: 9.3 mg/dL (ref 8.9–10.3)
Chloride: 107 mmol/L (ref 101–111)
Creatinine, Ser: 1.05 mg/dL (ref 0.61–1.24)
GFR calc Af Amer: >60 mL/min (ref >60)
GLUCOSE: 107 mg/dL — AB (ref 65–99)
POTASSIUM: 3.2 mmol/L — AB (ref 3.5–5.1)
Sodium: 139 mmol/L (ref 135–145)

## 2015-02-15 LAB — HEPATIC FUNCTION PANEL
ALBUMIN: 4.4 g/dL (ref 3.5–5.0)
ALT: 9 U/L — ABNORMAL LOW (ref 17–63)
AST: 17 U/L (ref 15–41)
Alkaline Phosphatase: 57 U/L (ref 38–126)
BILIRUBIN TOTAL: 0.9 mg/dL (ref 0.3–1.2)
Total Protein: 8 g/dL (ref 6.5–8.1)

## 2015-02-15 LAB — URINE DRUG SCREEN, QUALITATIVE (ARMC ONLY)
AMPHETAMINES, UR SCREEN: NOT DETECTED
BARBITURATES, UR SCREEN: NOT DETECTED
BENZODIAZEPINE, UR SCRN: NOT DETECTED
Cannabinoid 50 Ng, Ur ~~LOC~~: NOT DETECTED
Cocaine Metabolite,Ur ~~LOC~~: NOT DETECTED
MDMA (Ecstasy)Ur Screen: NOT DETECTED
METHADONE SCREEN, URINE: NOT DETECTED
Opiate, Ur Screen: NOT DETECTED
Phencyclidine (PCP) Ur S: NOT DETECTED
TRICYCLIC, UR SCREEN: NOT DETECTED

## 2015-02-15 LAB — TROPONIN I: Troponin I: <0.03 ng/mL (ref <0.031)

## 2015-02-15 NOTE — ED Notes (Signed)
dizzines today

## 2015-02-15 NOTE — ED Provider Notes (Addendum)
[facility] Emergency Department Provider Note  ____________________________________________  Time seen: Approximately 9:52 AM  I have reviewed the triage vital signs and the nursing notes.   HISTORY  Chief Complaint Dizziness   HPI Jeremy [name] is a 69 y.o. male who reports this morning he began getting confused. He said he would put down his cell phone and move it and then he could remove remember where he put it. He said he took something in his pocket and went back to check it and it wasn't there he said this is unusual for him is never had it before he also felt somewhat lightheaded and complained of double vision. He's never had any of these symptoms before. He said he was going to go and get some denture cream for his dentures but he was afraid he was going to get lost so he came to the hospital. Patient lives by himself. Patient denies any chest pain fever chills coughing etc  Past Medical History  Diagnosis Date  . Seizures ([place])   . Hypertension     There are no active problems to display for this patient.   History reviewed. No pertinent past surgical history.  Current Outpatient Rx  Name  Route  Sig  Dispense  Refill  . levETIRAcetam (KEPPRA) 500 MG tablet   Oral   Take 500-750 mg by mouth 2 (two) times daily. Take 1 tablet every morning and take 1-1/2 tablets every evening.           Allergies Review of patient's allergies indicates no known allergies.  No family history on file.  Social History Social History  Substance Use Topics  . Smoking status: Former Smoker -- 25 years  . Smokeless tobacco: None  . Alcohol Use: No     Comment: quit etoh x 3 months ago    Review of Systems Constitutional: No fever/chills Eyes: No visual changes. ENT: No sore throat. Cardiovascular: Denies chest pain. Respiratory: Denies shortness of breath. Gastrointestinal: No abdominal pain.  No nausea, no vomiting.  No diarrhea.  No  constipation. Genitourinary: Negative for dysuria. Musculoskeletal: Negative for back pain. Skin: Negative for rash. Neurological: Negative for headaches, focal weakness or numbness.  10-point ROS otherwise negative.  ____________________________________________   PHYSICAL EXAM:  VITAL SIGNS: ED Triage Vitals  Enc Vitals Group     BP 02/15/15 0917 170/83 mmHg     Pulse Rate 02/15/15 0917 76     Resp 02/15/15 0917 20     Temp 02/15/15 0917 98.4 F (36.9 C)     Temp Source 02/15/15 0917 Oral     SpO2 02/15/15 0917 98 %     Weight 02/15/15 0917 140 lb (63.504 kg)     Height 02/15/15 0917 6\' 1"  (1.854 m)     Head Cir --      Peak Flow --      Pain Score --      Pain Loc --      Pain Edu? --      Excl. in [place]? --     Constitutional: Alert and oriented. Well appearing and in no acute distress. Eyes: Conjunctivae are normal. PERRL. EOMI. Head: Atraumatic. Nose: No congestion/rhinnorhea. Mouth/Throat: Mucous membranes are moist.  Oropharynx non-erythematous. Neck: No stridor Cardiovascular: Normal rate, regular rhythm. Grossly normal heart sounds.  Good peripheral circulation. Respiratory: Normal respiratory effort.  No retractions. Lungs CTAB. Gastrointestinal: Soft and nontender. No distention. No abdominal bruits. No CVA tenderness. }Musculoskeletal: No lower extremity  tenderness nor edema.  No joint effusions. Neurologic:  Normal speech and language. No gross focal neurologic deficits are appreciated. Cranial nerves II through XII are intact cerebellar rapid alternating movements and finger to nose are normal motor strength is 5 over 5 throughout sensation is intact. The patient reports he has intermittent numbness of the dorsal surface of the right arm is been going on off and on for a month. Patient does seem somewhat easy to confuse when I instruct him in the various aspects of the neurological exam Skin:  Skin is warm, dry and intact. No rash noted. Psychiatric: Mood and  affect are normal. Speech and behavior are normal.  ____________________________________________   LABS (all labs ordered are listed, but only abnormal results are displayed)  Labs Reviewed  BASIC METABOLIC PANEL - Abnormal; Notable for the following:    Potassium 3.2 (*)    Glucose, Bld 107 (*)    All other components within normal limits  CBC - Abnormal; Notable for the following:    HCT 39.1 (*)    All other components within normal limits  URINALYSIS COMPLETEWITH MICROSCOPIC ([facility] ONLY) - Abnormal; Notable for the following:    Color, Urine YELLOW (*)    APPearance CLEAR (*)    Hgb urine dipstick 1+ (*)    Protein, ur 100 (*)    Bacteria, UA RARE (*)    Squamous Epithelial / LPF 0-5 (*)    All other components within normal limits  HEPATIC FUNCTION PANEL - Abnormal; Notable for the following:    ALT 9 (*)    Bilirubin, Direct <0.1 (*)    All other components within normal limits  TROPONIN I  URINE DRUG SCREEN, QUALITATIVE ([facility] ONLY)   ____________________________________________  EKG  EKG read and interpreted by me shows normal sinus rhythm at a rate of 76 normal axis patient has frequent PVCs. EKG is essentially unchanged from 26 November of this year. ____________________________________________  RADIOLOGY  CT shows chronic small vascular changes old strokes no acute changes. ____________________________________________   PROCEDURES  Patient remains confused during his time in the ER is not very confused but he is easily distracted has trouble following commands Department commands. He says his double vision is not returned.  ____________________________________________   INITIAL IMPRESSION / ASSESSMENT AND PLAN / ED COURSE  Pertinent labs & imaging results that were available during my care of the patient were reviewed by me and considered in my medical decision making (see chart for details).  At the current time patient does not seem to be as confused  as he reports having been. He also reports as double vision is gone and is not having any numbness in the arm right this minute. ____________________________________________   FINAL CLINICAL IMPRESSION(S) / ED DIAGNOSES  Final diagnoses:  Transient cerebral ischemia, unspecified transient cerebral ischemia type   additional diagnosis confusion patient not safe to go home    Nena Polio, MD 02/15/15 1519  Dr. Bobbye Charleston goes to talk to the patient to Bobbye Charleston feels the patient is okay to go home patient after talking to me wanted to stay but talking to Dr. Bobbye Charleston and wants to go home and before I can get back into the room to talk to him again the patient gets dressed and walks out of the hospital patient is angry and forceful and I'm unable to keep him here.  Nena Polio, MD 02/15/15 [phone]

## 2015-02-15 NOTE — ED Notes (Signed)
Pt upset and talking loudly; is dressed and was caught trying to walk out "am i done", I dont't want to be here; I am going home!" Md aware, charge nurse aware

## 2015-02-15 NOTE — ED Notes (Signed)
Patient transported to CT 

## 2015-02-15 NOTE — ED Notes (Signed)
States this am felt dizzy, , had some weakness in legs, states does not eat well

## 2015-02-15 NOTE — Consult Note (Signed)
[place] [facility] [place] NAME: Jedadiah Yehl    MR#:  [id number]  DATE OF BIRTH:  12-28-1945  DATE OF ADMISSION:  02/15/2015  PRIMARY CARE PHYSICIAN: Idelle Crouch, MD   REQUESTING/REFERRING PHYSICIAN: Conni Slipper  CHIEF COMPLAINT:   Chief Complaint  Patient presents with  . Dizziness    HISTORY OF PRESENT ILLNESS:  Cayson Weisenberger  is a 69 y.o. male with a known history of seizure disorder and hypertension. He came to the ER with double vision. The patient states his double vision has improved. The ER physician concerned that the patient was confused and felt uncomfortable sending him home. Hospitalist services were contacted for further evaluation. When I saw the patient, he was answering all questions appropriately and stated he wanted to go home. I spoke with Dr. Cinda Quest about this and he was going to reevaluate the patient.  PAST MEDICAL HISTORY:   Past Medical History  Diagnosis Date  . Seizures ([place])   . Hypertension     PAST SURGICAL HISTOIRY:   Past Surgical History  Procedure Laterality Date  . None      SOCIAL HISTORY:   Social History  Substance Use Topics  . Smoking status: Former Smoker -- 25 years  . Smokeless tobacco: Not on file  . Alcohol Use: No     Comment: quit etoh x 3 months ago    FAMILY HISTORY:  History reviewed. No pertinent family history.  DRUG ALLERGIES:  No Known Allergies  REVIEW OF SYSTEMS:  CONSTITUTIONAL: No fever, fatigue or weakness.  EYES: Positive for double vision that improved.  EARS, NOSE, AND THROAT: No tinnitus or ear pain.  RESPIRATORY: No cough, shortness of breath, wheezing or hemoptysis.  CARDIOVASCULAR: No chest pain, orthopnea, edema.  GASTROINTESTINAL: No nausea, vomiting, diarrhea or abdominal pain.  GENITOURINARY: No dysuria, hematuria.  ENDOCRINE: No polyuria, nocturia,  HEMATOLOGY: No anemia, easy bruising or bleeding SKIN: No rash or  lesion. MUSCULOSKELETAL: No joint pain or arthritis.   NEUROLOGIC: No tingling, numbness, weakness.  PSYCHIATRY: No anxiety or depression.   MEDICATIONS AT HOME:   Prior to Admission medications   Medication Sig Start Date End Date Taking? Authorizing Provider  levETIRAcetam (KEPPRA) 500 MG tablet Take 500-750 mg by mouth 2 (two) times daily. Take 1 tablet every morning and take 1-1/2 tablets every evening.    Historical Provider, MD      VITAL SIGNS:  Blood pressure 125/103, pulse 77, temperature 98.4 F (36.9 C), temperature source Oral, resp. rate 22, height 6\' 1"  (1.854 m), weight 63.504 kg (140 lb), SpO2 99 %. Blood pressure was very variable during his time here and last blood pressure reading was 157/85 lying, 141/100 sitting and 155/108 standing. PHYSICAL EXAMINATION:  GENERAL:  69 y.o.-year-old patient lying in the bed with no acute distress.  EYES: Pupils equal, round, reactive to light and accommodation. No scleral icterus. Extraocular muscles intact.  HEENT: Head atraumatic, normocephalic. Oropharynx and nasopharynx clear.  NECK:  Supple, no jugular venous distention. No thyroid enlargement, no tenderness.  LUNGS: Normal breath sounds bilaterally, no wheezing, rales,rhonchi or crepitation. No use of accessory muscles of respiration.  CARDIOVASCULAR: S1, S2 normal. No murmurs, rubs, or gallops.  ABDOMEN: Soft, nontender, nondistended. Bowel sounds present. No organomegaly or mass.  EXTREMITIES: No pedal edema, cyanosis, or clubbing.  NEUROLOGIC: Cranial nerves II through XII are intact. Muscle strength 5/5 in all extremities. Sensation intact. Gait not checked.  PSYCHIATRIC: The patient is alert  and oriented x 3.  SKIN: No obvious rash, lesion, or ulcer.   LABORATORY PANEL:   CBC  Recent Labs Lab 02/15/15 0914  WBC 4.0  HGB 13.2  HCT 39.1*  PLT 222    ------------------------------------------------------------------------------------------------------------------  Chemistries   Recent Labs Lab 02/15/15 0914  NA 139  K 3.2*  CL 107  CO2 24  GLUCOSE 107*  BUN 10  CREATININE 1.05  CALCIUM 9.3  AST 17  ALT 9*  ALKPHOS 57  BILITOT 0.9   ------------------------------------------------------------------------------------------------------------------  Cardiac Enzymes  Recent Labs Lab 02/15/15 0914  TROPONINI <0.03   ------------------------------------------------------------------------------------------------------------------  RADIOLOGY:  Ct Head Wo Contrast  02/15/2015  CLINICAL DATA:  69 year old male with dizziness this morning and weakness in the legs. EXAM: CT HEAD WITHOUT CONTRAST TECHNIQUE: Contiguous axial images were obtained from the base of the skull through the vertex without intravenous contrast. COMPARISON:  Head CT 12/30/2014. FINDINGS: Patchy and confluent areas of decreased attenuation are noted throughout the deep and periventricular white matter of the cerebral hemispheres bilaterally, compatible with chronic microvascular ischemic disease. 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 No acute intracranial abnormalities. Specifically, no evidence of acute intracranial hemorrhage, no definite findings of acute/subacute cerebral ischemia, no mass, mass effect, hydrocephalus or abnormal intra or extra-axial fluid collections. Visualized paranasal sinuses and mastoids are well pneumatized. No acute displaced skull fractures are identified. IMPRESSION: 1. No acute intracranial abnormalities. 2. Mild chronic ischemic changes in the cerebral white matter, as above. Electronically Signed   By: Vinnie Langton M.D.   On: 02/15/2015 10:47    EKG:   Sinus rhythm. left atrial enlargement, early beats seen  IMPRESSION AND PLAN:   1. Acute encephalopathy-  ER physician wanted me to admit for a TIA workup with his blurred vision. The patient did not want to stay in the hospital. He actually left without being discharged. 2. Accelerated hypertension- the patient left without being given medication for his blood pressure. Will need to follow-up with Dr. Doy Hutching as outpatient. 3. Blurred vision which had resolved- this could be secondary to accelerated hypertension. 4. History of seizure on Keppra as outpatient  All the records are reviewed and case discussed with Consulting provider. Patient left the hospital as per the nursing staff without being discharged.  CODE STATUS: Full code  TOTAL TIME TAKING CARE OF THIS PATIENT: 50 minutes.    Loletha Grayer M.D on 02/15/2015 at 7:20 PM  Between 7am to 6pm - Pager - [phone]  After 6pm go to [email or web address] - password [email or web address]  Office  [phone]  CC: Primary care Physician: Idelle Crouch, MD

## 2015-04-23 ENCOUNTER — Emergency Department
Admission: EM | Admit: 2015-04-23 | Discharge: 2015-04-23 | Disposition: A | Discharge status: Home / Self Care | Payer: Commercial Managed Care - HMO | Attending: Emergency Medicine | Admitting: Emergency Medicine

## 2015-04-23 ENCOUNTER — Emergency Department: Payer: Commercial Managed Care - HMO

## 2015-04-23 ENCOUNTER — Encounter: Payer: Self-pay | Admitting: Emergency Medicine

## 2015-04-23 DIAGNOSIS — R51 Headache: Secondary | ICD-10-CM | POA: Diagnosis not present

## 2015-04-23 DIAGNOSIS — I10 Essential (primary) hypertension: Secondary | ICD-10-CM | POA: Diagnosis not present

## 2015-04-23 DIAGNOSIS — G44209 Tension-type headache, unspecified, not intractable: Secondary | ICD-10-CM | POA: Diagnosis not present

## 2015-04-23 DIAGNOSIS — Z87891 Personal history of nicotine dependence: Secondary | ICD-10-CM | POA: Diagnosis not present

## 2015-04-23 DIAGNOSIS — Z79899 Other long term (current) drug therapy: Secondary | ICD-10-CM | POA: Diagnosis not present

## 2015-04-23 NOTE — Discharge Instructions (Signed)
General Headache Without Cause A headache is pain or discomfort felt around the head or neck area. The specific cause of a headache may not be found. There are many causes and types of headaches. A few common ones are:  Tension headaches.  Migraine headaches.  Cluster headaches.  Chronic daily headaches. HOME CARE INSTRUCTIONS  Watch your condition for any changes. Take these steps to help with your condition: Managing Pain  Take over-the-counter and prescription medicines only as told by your health care provider.  Lie down in a dark, quiet room when you have a headache.  If directed, apply ice to the head and neck area:  Put ice in a plastic bag.  Place a towel between your skin and the bag.  Leave the ice on for 20 minutes, 2-3 times per day.  Use a heating pad or hot shower to apply heat to the head and neck area as told by your health care provider.  Keep lights dim if bright lights bother you or make your headaches worse. Eating and Drinking  Eat meals on a regular schedule.  Limit alcohol use.  Decrease the amount of caffeine you drink, or stop drinking caffeine. General Instructions  Keep all follow-up visits as told by your health care provider. This is important.  Keep a headache journal to help find out what may trigger your headaches. For example, write down:  What you eat and drink.  How much sleep you get.  Any change to your diet or medicines.  Try massage or other relaxation techniques.  Limit stress.  Sit up straight, and do not tense your muscles.  Do not use tobacco products, including cigarettes, chewing tobacco, or e-cigarettes. If you need help quitting, ask your health care provider.  Exercise regularly as told by your health care provider.  Sleep on a regular schedule. Get 7-9 hours of sleep, or the amount recommended by your health care provider. SEEK MEDICAL CARE IF:   Your symptoms are not helped by medicine.  You have a  headache that is different from the usual headache.  You have nausea or you vomit.  You have a fever. SEEK IMMEDIATE MEDICAL CARE IF:   Your headache becomes severe.  You have repeated vomiting.  You have a stiff neck.  You have a loss of vision.  You have problems with speech.  You have pain in the eye or ear.  You have muscular weakness or loss of muscle control.  You lose your balance or have trouble walking.  You feel faint or pass out.  You have confusion.   This information is not intended to replace advice given to you by your health care provider. Make sure you discuss any questions you have with your health care provider.   Document Released: 02/03/2005 Document Revised: 10/25/2014 Document Reviewed: 05/29/2014 Elsevier Interactive Patient Education 2016 [employer]  Tension Headache A tension headache is pain, pressure, or aching that is felt over the front and sides of your head. These headaches can last from 30 minutes to several days. HOME CARE Managing Pain  Take over-the-counter and prescription medicines only as told by your doctor.  Lie down in a dark, quiet room when you have a headache.  If directed, apply ice to your head and neck area:  Put ice in a plastic bag.  Place a towel between your skin and the bag.  Leave the ice on for 20 minutes, 2-3 times per day.  Use a heating pad or a  hot shower to apply heat to your head and neck area as told by your doctor. Eating and Drinking  Eat meals on a regular schedule.  Do not drink a lot of alcohol.  Do not use a lot of caffeine, or stop using caffeine. General Instructions  Keep all follow-up visits as told by your doctor. This is important.  Keep a journal to find out if certain things bring on headaches. For example, write down:  What you eat and drink.  How much sleep you get.  Any change to your diet or medicines.  Try getting a massage, or doing other things that help you to  relax.  Lessen stress.  Sit up straight. Do not tighten (tense) your muscles.  Do not use tobacco products. This includes cigarettes, chewing tobacco, or e-cigarettes. If you need help quitting, ask your doctor.  Exercise regularly as told by your doctor.  Get enough sleep. This may mean 7-9 hours of sleep. GET HELP IF:  Your symptoms are not helped by medicine.  You have a headache that feels different from your usual headache.  You feel sick to your stomach (nauseous) or you throw up (vomit).  You have a fever. GET HELP RIGHT AWAY IF:  Your headache becomes very bad.  You keep throwing up.  You have a stiff neck.  You have trouble seeing.  You have trouble speaking.  You have pain in your eye or ear.  Your muscles are weak or you lose muscle control.  You lose your balance or you have trouble walking.  You feel like you will pass out (faint) or you pass out.  You have confusion.   This information is not intended to replace advice given to you by your health care provider. Make sure you discuss any questions you have with your health care provider.   Document Released: 04/30/2009 Document Revised: 10/25/2014 Document Reviewed: 05/29/2014 Elsevier Interactive Patient Education [employer].

## 2015-04-23 NOTE — ED Provider Notes (Signed)
Time Seen: Approximately 1630 I have reviewed the triage notes  Chief Complaint: Headache   History of Present Illness: Jeremy [name] is a 70 y.o. male who presents with some intermittent headache over the last 2 days. Patient states the headache start toward the back of his head and then come forward across to the temporal region. He states he doesn't have a headache right now and can't think of any obvious situations and create these headaches. Denies any neck pain or stiffness. He states he had one episode of some blurred vision but no visual field deficits. He denies any nausea, vomiting, weakness. Patient states he has a history of seizure activity and does have a neurologist but has not seen him for headaches before in the past. He states his last seizure was 4-6 months ago. As been taking his Keppra and has done well. He denies any fever or head trauma Past Medical History  Diagnosis Date  . Seizures ([place])   . Hypertension     There are no active problems to display for this patient.   Past Surgical History  Procedure Laterality Date  . None      Past Surgical History  Procedure Laterality Date  . None      Current Outpatient Rx  Name  Route  Sig  Dispense  Refill  . levETIRAcetam (KEPPRA) 500 MG tablet   Oral   Take 500-750 mg by mouth 2 (two) times daily. Take 1 tablet every morning and take 1-1/2 tablets every evening.           Allergies:  Review of patient's allergies indicates no known allergies.  Family History: No family history on file.  Social History: Social History  Substance Use Topics  . Smoking status: Former Smoker -- 25 years  . Smokeless tobacco: None  . Alcohol Use: No     Comment: quit etoh x 3 months ago     Review of Systems:   10 point review of systems was performed and was otherwise negative:  Constitutional: No fever Eyes: No visual disturbances ENT: No sore throat, ear pain Cardiac: No chest pain Respiratory: No  shortness of breath, wheezing, or stridor Abdomen: No abdominal pain, no vomiting, No diarrhea Endocrine: No weight loss, No night sweats Extremities: No peripheral edema, cyanosis Skin: No rashes, easy bruising Neurologic: No focal weakness, trouble with speech or swollowing Urologic: No dysuria, Hematuria, or urinary frequency   Physical Exam:  ED Triage Vitals  Enc Vitals Group     BP 04/23/15 1516 141/70 mmHg     Pulse Rate 04/23/15 1516 66     Resp 04/23/15 1516 18     Temp 04/23/15 1516 98.2 F (36.8 C)     Temp Source 04/23/15 1516 Oral     SpO2 04/23/15 1516 100 %     Weight 04/23/15 1516 138 lb (62.596 kg)     Height 04/23/15 1516 6\' 1"  (1.854 m)     Head Cir --      Peak Flow --      Pain Score 04/23/15 1516 2     Pain Loc --      Pain Edu? --      Excl. in [place]? --     General: Awake , Alert , and Oriented times 3; GCS 15 Head: Normal cephalic , atraumatic. No obvious reproducible component to his headache and no rashes. Eyes: Pupils equal , round, reactive to light Nose/Throat: No nasal drainage, patent upper airway  without erythema or exudate.  Neck: Supple, Full range of motion, no meningeal signs, No anterior adenopathy or palpable thyroid masses Lungs: Clear to ascultation without wheezes , rhonchi, or rales Heart: Regular rate, regular rhythm without murmurs , gallops , or rubs Abdomen: Soft, non tender without rebound, guarding , or rigidity; bowel sounds positive and symmetric in all 4 quadrants. No organomegaly .        Extremities: 2 plus symmetric pulses. No edema, clubbing or cyanosis Neurologic: normal ambulation, Motor symmetric without deficits, sensory intact Skin: warm, dry, no rashes     Radiology:    Narrative:    CLINICAL DATA: Headache for 3 days. History of seizures.  EXAM: CT HEAD WITHOUT CONTRAST  TECHNIQUE: Contiguous axial images were obtained from the base of the skull through the vertex without intravenous  contrast.  COMPARISON: 02/15/2015 head CT.  FINDINGS: No evidence of parenchymal hemorrhage or extra-axial fluid collection. No mass lesion, mass effect, or midline shift.  No CT evidence of acute infarction. There is mild intracranial atherosclerosis. Nonspecific mild stable subcortical and periventricular white matter hypodensity, most in keeping with chronic small vessel ischemic change.  Cerebral volume is age appropriate. No ventriculomegaly.  The visualized paranasal sinuses are essentially clear. The mastoid air cells are unopacified. No evidence of calvarial fracture.  IMPRESSION: 1. No evidence of acute intracranial abnormality. 2. Mild chronic small vessel ischemic white matter change.   Electronically Signed By: Ilona Sorrel M.D. On: 04/23/2015 16:48      I personally reviewed the radiologic studies    ED Course: Differential diagnosis includes but is not exclusive to subarachnoid hemorrhage, meningitis, encephalitis, previous head trauma, cavernous venous thrombosis, muscle tension headache, temporal arteritis, migraine or migraine equivalent, etc. I felt given the patient's current presentation and objective findings this most likely was a muscle tension headache given the bilateral temporal nature. He denies any eye pain or visual disturbances on a consistent basis for temporal arteritis.  Assessment:  Acute unspecified cephalgia   Final Clinical Impression:  Final diagnoses:  Tension-type headache, not intractable, unspecified chronicity pattern     Plan:   Outpatient management Patient was advised to return immediately if condition worsens. Patient was advised to follow up with their primary care physician or other specialized physicians involved in their outpatient care             Daymon Larsen, MD 04/23/15 1815

## 2015-04-23 NOTE — ED Notes (Signed)
Pt states headache for 2 days, states pain was on top of his head with some blurry vision, states hx of seizures, at present pt states his headache is better, pt awake and alert in no acute distress

## 2015-04-23 NOTE — ED Notes (Signed)
Pt reports has had a headache for three days. Pt states feels like the blood is rushing to his head. Pt states has a history of seizures. Pt denies any recent seizure activity. Pt states last seizure was 4-6 months ago.

## 2015-05-17 DIAGNOSIS — I6523 Occlusion and stenosis of bilateral carotid arteries: Secondary | ICD-10-CM | POA: Diagnosis not present

## 2015-05-17 DIAGNOSIS — R55 Syncope and collapse: Secondary | ICD-10-CM | POA: Diagnosis not present

## 2015-07-27 DIAGNOSIS — R7309 Other abnormal glucose: Secondary | ICD-10-CM | POA: Diagnosis not present

## 2015-07-27 DIAGNOSIS — I10 Essential (primary) hypertension: Secondary | ICD-10-CM | POA: Diagnosis not present

## 2015-07-27 DIAGNOSIS — Z79899 Other long term (current) drug therapy: Secondary | ICD-10-CM | POA: Diagnosis not present

## 2015-07-27 DIAGNOSIS — Z13220 Encounter for screening for lipoid disorders: Secondary | ICD-10-CM | POA: Diagnosis not present

## 2015-07-27 DIAGNOSIS — G40909 Epilepsy, unspecified, not intractable, without status epilepticus: Secondary | ICD-10-CM | POA: Diagnosis not present

## 2015-07-27 DIAGNOSIS — G44229 Chronic tension-type headache, not intractable: Secondary | ICD-10-CM | POA: Diagnosis not present

## 2015-07-27 DIAGNOSIS — R569 Unspecified convulsions: Secondary | ICD-10-CM | POA: Diagnosis not present

## 2015-07-27 DIAGNOSIS — Z125 Encounter for screening for malignant neoplasm of prostate: Secondary | ICD-10-CM | POA: Diagnosis not present

## 2015-08-27 ENCOUNTER — Emergency Department
Admission: EM | Admit: 2015-08-27 | Discharge: 2015-08-27 | Disposition: A | Discharge status: Home / Self Care | Payer: Commercial Managed Care - HMO | Attending: Emergency Medicine | Admitting: Emergency Medicine

## 2015-08-27 ENCOUNTER — Emergency Department: Payer: Commercial Managed Care - HMO

## 2015-08-27 DIAGNOSIS — R1031 Right lower quadrant pain: Secondary | ICD-10-CM | POA: Diagnosis present

## 2015-08-27 DIAGNOSIS — Z87891 Personal history of nicotine dependence: Secondary | ICD-10-CM | POA: Diagnosis not present

## 2015-08-27 DIAGNOSIS — Z79899 Other long term (current) drug therapy: Secondary | ICD-10-CM | POA: Diagnosis not present

## 2015-08-27 DIAGNOSIS — K460 Unspecified abdominal hernia with obstruction, without gangrene: Secondary | ICD-10-CM | POA: Diagnosis not present

## 2015-08-27 DIAGNOSIS — N200 Calculus of kidney: Secondary | ICD-10-CM | POA: Diagnosis not present

## 2015-08-27 DIAGNOSIS — K5660 Unspecified intestinal obstruction: Secondary | ICD-10-CM | POA: Diagnosis not present

## 2015-08-27 DIAGNOSIS — Z8669 Personal history of other diseases of the nervous system and sense organs: Secondary | ICD-10-CM | POA: Diagnosis not present

## 2015-08-27 DIAGNOSIS — K56609 Unspecified intestinal obstruction, unspecified as to partial versus complete obstruction: Secondary | ICD-10-CM

## 2015-08-27 DIAGNOSIS — I10 Essential (primary) hypertension: Secondary | ICD-10-CM | POA: Insufficient documentation

## 2015-08-27 LAB — COMPREHENSIVE METABOLIC PANEL
ALK PHOS: 54 U/L (ref 38–126)
ALT: 9 U/L — AB (ref 17–63)
AST: 17 U/L (ref 15–41)
Albumin: 4.4 g/dL (ref 3.5–5.0)
Anion gap: 8 (ref 5–15)
BILIRUBIN TOTAL: 0.8 mg/dL (ref 0.3–1.2)
BUN: 13 mg/dL (ref 6–20)
CALCIUM: 9.1 mg/dL (ref 8.9–10.3)
CO2: 26 mmol/L (ref 22–32)
CREATININE: 1.17 mg/dL (ref 0.61–1.24)
Chloride: 104 mmol/L (ref 101–111)
GFR calc Af Amer: >60 mL/min (ref >60)
Glucose, Bld: 93 mg/dL (ref 65–99)
POTASSIUM: 3.6 mmol/L (ref 3.5–5.1)
Sodium: 138 mmol/L (ref 135–145)
TOTAL PROTEIN: 7.6 g/dL (ref 6.5–8.1)

## 2015-08-27 LAB — CBC
HEMATOCRIT: 38.4 % — AB (ref 40.0–52.0)
Hemoglobin: 13 g/dL (ref 13.0–18.0)
MCH: 29.8 pg (ref 26.0–34.0)
MCHC: 33.8 g/dL (ref 32.0–36.0)
MCV: 88.1 fL (ref 80.0–100.0)
Platelets: 204 10*3/uL (ref 150–440)
RBC: 4.35 MIL/uL — ABNORMAL LOW (ref 4.40–5.90)
RDW: 14.2 % (ref 11.5–14.5)
WBC: 5.2 10*3/uL (ref 3.8–10.6)

## 2015-08-27 LAB — LIPASE, BLOOD: LIPASE: 44 U/L (ref 11–51)

## 2015-08-27 MED ORDER — ONDANSETRON HCL 4 MG/2ML IJ SOLN
4.0000 mg | Freq: Once | INTRAMUSCULAR | Status: AC
Start: 2015-08-27 — End: 2015-08-27
  Administered 2015-08-27: 4 mg via INTRAVENOUS

## 2015-08-27 MED ORDER — MORPHINE SULFATE (PF) 4 MG/ML IV SOLN
INTRAVENOUS | Status: AC
  Administered 2015-08-27: 4 mg via INTRAVENOUS
  Filled 2015-08-27: qty 1

## 2015-08-27 MED ORDER — DIATRIZOATE MEGLUMINE & SODIUM 66-10 % PO SOLN
15.0000 mL | Freq: Once | ORAL | Status: AC
  Administered 2015-08-27: 15 mL via ORAL

## 2015-08-27 MED ORDER — ONDANSETRON HCL 4 MG/2ML IJ SOLN
INTRAMUSCULAR | Status: AC
  Administered 2015-08-27: 4 mg via INTRAVENOUS
  Filled 2015-08-27: qty 2

## 2015-08-27 MED ORDER — IOPAMIDOL (ISOVUE-300) INJECTION 61%
85.0000 mL | Freq: Once | INTRAVENOUS | Status: AC | PRN
  Administered 2015-08-27: 85 mL via INTRAVENOUS

## 2015-08-27 MED ORDER — MORPHINE SULFATE (PF) 4 MG/ML IV SOLN
4.0000 mg | Freq: Once | INTRAVENOUS | Status: AC
  Administered 2015-08-27: 4 mg via INTRAVENOUS

## 2015-08-27 NOTE — Discharge Instructions (Signed)
Hernia A hernia happens when an organ or tissue inside your body pushes out through a weak spot in the belly (abdomen). HOME CARE  Avoid stretching or overusing (straining) the muscles near the hernia.  Do not lift anything heavier than 10 lb (4.5 kg).  Use the muscles in your leg when you lift something up. Do not use the muscles in your back.  When you cough, try to cough gently.  Eat a diet that has a lot of fiber. Eat lots of fruits and vegetables.  Drink enough fluids to keep your pee (urine) clear or pale yellow. Try to drink 6-8 glasses of water a day.  Take medicines to make your poop soft (stool softeners) as told by your doctor.  Lose weight, if you are overweight.  Do not use any tobacco products, including cigarettes, chewing tobacco, or electronic cigarettes. If you need help quitting, ask your doctor.  Keep all follow-up visits as told by your doctor. This is important. GET HELP IF:  The skin by the hernia gets puffy (swollen) or red.  The hernia is painful. GET HELP RIGHT AWAY IF:  You have a fever.  You have belly pain that is getting worse.  You feel sick to your stomach (nauseous) or you throw up (vomit).  You cannot push the hernia back in place by gently pressing on it while you are lying down.  The hernia:  Changes in shape or size.  Is stuck outside your belly.  Changes color.  Feels hard or tender.   This information is not intended to replace advice given to you by your health care provider. Make sure you discuss any questions you have with your health care provider.   Document Released: 07/24/2009 Document Revised: 02/24/2014 Document Reviewed: 12/14/2013 Elsevier Interactive Patient Education 2016 [place] Bowel Obstruction A small bowel obstruction is a blockage in the small bowel. The small bowel, which is also called the small intestine, is a long, slender tube that connects the stomach to the colon. When a person eats  and drinks, food and fluids go from the stomach to the small bowel. This is where most of the nutrients in the food and fluids are absorbed. A small bowel obstruction will prevent food and fluids from passing through the small bowel as they normally do during digestion. The small bowel can become partially or completely blocked. This can cause symptoms such as abdominal pain, vomiting, and bloating. If this condition is not treated, it can be dangerous because the small bowel could rupture. CAUSES Common causes of this condition include:  Scar tissue from previous surgery or radiation treatment.  Recent surgery. This may cause the movements of the bowel to slow down and cause food to block the intestine.  Hernias.  Inflammatory bowel disease (colitis).  Twisting of the bowel (volvulus).  Tumors.  A foreign body.  Slipping of a part of the bowel into another part (intussusception). SYMPTOMS Symptoms of this condition include:  Abdominal pain. This may be dull cramps or sharp pain. It may occur in one area, or it may be present in the entire abdomen. Pain can range from mild to severe, depending on the degree of obstruction.  Nausea and vomiting. Vomit may be greenish or a yellow bile color.  Abdominal bloating.  Constipation.  Lack of passing gas.  Frequent belching.  Diarrhea. This may occur if the obstruction is partial and runny stool is able to leak around the obstruction. DIAGNOSIS This condition may  be diagnosed based on a physical exam, medical history, and X-rays of the abdomen. You may also have other tests, such as a CT scan of the abdomen and pelvis. TREATMENT Treatment for this condition depends on the cause and severity of the problem. Treatment options may include:  Bed rest along with fluids and pain medicines that are given through an IV tube inserted into one of your veins. Sometimes, this is all that is needed for the obstruction to improve.  Following a  simple diet. In some cases, a clear liquid diet may be required for several days. This allows the bowel to rest.  Placement of a small tube (nasogastric tube) into the stomach. When the bowel is blocked, it usually swells up like a balloon that is filled with air and fluids. The air and fluids may be removed by suction through the nasogastric tube. This can help with pain, discomfort, and nausea. It can also help the obstruction to clear up faster.  Surgery. This may be required if other treatments do not work. Bowel obstruction from a hernia may require early surgery and can be an emergency procedure. Surgery may also be required for scar tissue that causes frequent or severe obstructions. HOME CARE INSTRUCTIONS  Get plenty of rest.  Follow instructions from your health care provider about eating restrictions. You may need to avoid solid foods and consume only clear liquids until your condition improves.  Take over-the-counter and prescription medicines only as told by your health care provider.  Keep all follow-up visits as told by your health care provider. This is important. SEEK MEDICAL CARE IF:  You have a fever.  You have chills. SEEK IMMEDIATE MEDICAL CARE IF:  You have increased pain or cramping.  You vomit blood.  You have uncontrolled vomiting or nausea.  You cannot drink fluids because of vomiting or pain.  You develop confusion.  You begin feeling very dry or thirsty (dehydrated).  You have severe bloating.  You feel extremely weak or you faint.   This information is not intended to replace advice given to you by your health care provider. Make sure you discuss any questions you have with your health care provider.   Document Released: 04/22/2005 Document Revised: 10/25/2014 Document Reviewed: 03/30/2014 Elsevier Interactive Patient Education [employer].

## 2015-08-27 NOTE — ED Notes (Signed)
Pt reports hernia on groin, reports abd pain for couple days

## 2015-08-27 NOTE — Consult Note (Signed)
Jeremy [name] is a 70 y.o. male  with pain in his right groin for several days.  HPI: He has a known right inguinal hernia and has had problems with the hernia for several years. 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 Over the last several days he's had increasing abdominal pain when he eats and noted some problems with the hernia itself enlarging and becoming more tender. He denies any bowel problems. He is passing gas. He's had no nausea or vomiting.  Denies any other GI history. He's not had a colonoscopy. Denies any history of hepatitis, yellow jaundice, peptic ulcer disease, gallbladder disease, diverticulitis. Workup in the emergency room identified normal liver function studies and normal white blood cell count. CT scan demonstrated what appeared to be a significant incarcerated right inguinal hernia with the cecum and terminal ileum in the defect. There was evidence of complete small bowel obstruction. The surgical service was consulted. He has had no previous abdominal surgery.  Past Medical History  Diagnosis Date  . Seizures ([place])   . Hypertension    Past Surgical History  Procedure Laterality Date  . None     Social History   Social History  . Marital Status: Single    Spouse Name: N/A  . Number of Children: N/A  . Years of Education: N/A   Social History Main Topics  . Smoking status: Former Smoker -- 25 years  . Smokeless tobacco: None  . Alcohol Use: No     Comment: quit etoh x 3 months ago  . Drug Use: Yes    Special: Cocaine, IV     Comment: quit 3 months ago - x 17 year drug and alcohol use  . Sexual Activity: Not Asked   Other Topics Concern  . None   Social History Narrative    Review of Systems: Review of Systems  Constitutional: Negative for fever, chills and weight loss.  HENT: Negative.   Eyes: Negative.   Respiratory: Negative for cough, hemoptysis, shortness of breath and  wheezing.   Cardiovascular: Negative for chest pain and palpitations.  Gastrointestinal: Positive for abdominal pain. Negative for heartburn, nausea, vomiting, diarrhea and constipation.  Genitourinary: Negative.   Musculoskeletal: Negative.   Skin: Negative.   Neurological: Negative.   Psychiatric/Behavioral: Positive for substance abuse. Negative for depression and suicidal ideas.    PHYSICAL EXAM: BP 137/76 mmHg  Pulse 64  Temp(Src) 98.6 F (37 C) (Oral)  Resp 16  Ht 6\' 1"  (1.854 m)  Wt 61.236 kg (135 lb)  BMI 17.82 kg/m2  SpO2 100%  Physical Exam  Constitutional: He is oriented to person, place, and time.  Thin cachectic  HENT:  Head: Normocephalic and atraumatic.  Eyes: EOM are normal. Pupils are equal, round, and reactive to light.  Neck: Normal range of motion. Neck supple.  Cardiovascular: Normal rate and regular rhythm.   Pulmonary/Chest: Effort normal and breath sounds normal.  Abdominal: Soft. Bowel sounds are normal. He exhibits mass. He exhibits no distension. There is tenderness. There is no rebound and no guarding. A hernia is present.  Musculoskeletal: Normal range of motion. He exhibits no tenderness.  Neurological: He is alert and oriented to person, place, and time.  Skin: Skin is warm and dry.  Psychiatric: He has a normal mood and affect. His behavior is normal.   His abdomen is soft with minimal abdominal tenderness but he is a large mass in his right  groin consistent with an incarcerated hernia. It is mildly tender and despite numerous attempts is not reducible. It does appear to involve his right scrotum. I can feel his testicle.  Impression/Plan: This gentleman has a significant incarcerated hernia with what appears to be total small bowel obstruction. I think he needs to have urgent surgical intervention. I talk with him at length about the options the risks benefits of surgical intervention. He has refused to consider surgery at this time. We talked  about another attempt at reduction which was performed and again was unsuccessful. He does not one stay in the hospital and has requested to be discharged" come back if he has more problems". I very much advised against this course of action but he is insistent in pursuing it. We will be available to assist if he returns to the hospital.   Dia Crawford III, MD  08/27/2015, 1:52 PM

## 2015-08-27 NOTE — ED Provider Notes (Signed)
[facility] Emergency Department Provider Note   ____________________________________________    I have reviewed the triage vital signs and the nursing notes.   HISTORY  Chief Complaint Abdominal Pain     HPI ELCHONON SEDIVY is a 70 y.o. male who presents with abdominal pain. Patient reports 3 days of primarily right lower quadrant abdominal pain. He states occasionally radiates to the right upper quadrant. He does have a long history of hernia in his right groin he reports has become more tender recently. He denies fevers chills. No nausea vomiting. He has had a bowel movement this. He is passing gas.No history of abdominal surgery. He has never had this pain before.   Past Medical History  Diagnosis Date  . Seizures ([place])   . Hypertension     There are no active problems to display for this patient.   Past Surgical History  Procedure Laterality Date  . None      Current Outpatient Rx  Name  Route  Sig  Dispense  Refill  . levETIRAcetam (KEPPRA) 500 MG tablet   Oral   Take 500-750 mg by mouth 2 (two) times daily. Take 1 tablet every morning and take 1-1/2 tablets every evening.           Allergies Review of patient's allergies indicates no known allergies.  No family history on file.  Social History Social History  Substance Use Topics  . Smoking status: Former Smoker -- 25 years  . Smokeless tobacco: None  . Alcohol Use: No     Comment: quit etoh x 3 months ago    Review of Systems  Constitutional: No fever/chills Eyes: No visual changes.  ENT: No sore throat. Cardiovascular: Denies chest pain. Respiratory: Denies shortness of breath. Gastrointestinal: As above  Genitourinary: Negative for dysuria. Musculoskeletal: Negative for back pain. Skin: Negative for rash. Neurological: Negative for headaches   10-point ROS otherwise negative.  ____________________________________________   PHYSICAL  EXAM:  VITAL SIGNS: ED Triage Vitals  Enc Vitals Group     BP 08/27/15 1051 143/90 mmHg     Pulse Rate 08/27/15 1051 86     Resp 08/27/15 1051 18     Temp 08/27/15 1051 98.6 F (37 C)     Temp Source 08/27/15 1051 Oral     SpO2 08/27/15 1051 99 %     Weight 08/27/15 1051 135 lb (61.236 kg)     Height 08/27/15 1051 6\' 1"  (1.854 m)     Head Cir --      Peak Flow --      Pain Score 08/27/15 1113 8     Pain Loc --      Pain Edu? --      Excl. in [place]? --     Constitutional: Alert and oriented. No acute distress. Pleasant and interactive Eyes: Conjunctivae are normal.  Head: Atraumatic.Normocephalic Nose: No congestion/rhinnorhea. Mouth/Throat: Mucous membranes are moist.  Oropharynx non-erythematous. Neck: No stridor. Painless ROM Cardiovascular: Normal rate, regular rhythm. Grossly normal heart sounds.  Good peripheral circulation. Respiratory: Normal respiratory effort.  No retractions. Lungs CTAB. Gastrointestinal: Moderate to palpation right lower quadrant. Significant firm hernia noted right groin, approximately baseball sized, unable to reduce.. No distention.  No CVA tenderness. Genitourinary: deferred Musculoskeletal: No lower extremity tenderness nor edema.  Warm and well perfused Neurologic:  Normal speech and language. No gross focal neurologic deficits are appreciated.  Skin:  Skin is warm, dry and intact. No rash noted. Psychiatric: Mood  and affect are normal. Speech and behavior are normal.  ____________________________________________   LABS (all labs ordered are listed, but only abnormal results are displayed)  Labs Reviewed  CBC  COMPREHENSIVE METABOLIC PANEL  LIPASE, BLOOD  URINALYSIS COMPLETEWITH MICROSCOPIC ([place])   ____________________________________________  EKG  None ____________________________________________  RADIOLOGY  CT scan shows small bowel obstruction secondary to the  hernia ____________________________________________   PROCEDURES  Procedure(s) performed: No    Critical Care performed: No ____________________________________________   INITIAL IMPRESSION / ASSESSMENT AND PLAN / ED COURSE  Pertinent labs & imaging results that were available during my care of the patient were reviewed by me and considered in my medical decision making (see chart for details).  Patient presents with primarily right lower quadrant pain, likely related to the large hernia which I cannot reduce. Apparently he says this hernia has been there for quite some time. We'll obtain imaging, given IV morphine and IV Zofran.  ----------------------------------------- 12:34 PM on 08/27/2015 -----------------------------------------  Called by radiology regarding small bowel obstruction, I spoke with Dr. Pat Patrick who will see the patient in the emergency department. Discussed with patient ____________________________________________   FINAL CLINICAL IMPRESSION(S) / ED DIAGNOSES  Final diagnoses:  SBO (small bowel obstruction) ([place])  Incarcerated hernia      NEW MEDICATIONS STARTED DURING THIS VISIT:  New Prescriptions   No medications on file     Note:  This document was prepared using Dragon voice recognition software and may include unintentional dictation errors.    Lavonia Drafts, MD 08/27/15 1235

## 2015-08-27 NOTE — ED Notes (Signed)
Up to bathroom to void.  Steady gait.

## 2015-10-01 DIAGNOSIS — G44229 Chronic tension-type headache, not intractable: Secondary | ICD-10-CM | POA: Diagnosis not present

## 2015-10-01 DIAGNOSIS — R200 Anesthesia of skin: Secondary | ICD-10-CM | POA: Diagnosis not present

## 2015-10-01 DIAGNOSIS — R569 Unspecified convulsions: Secondary | ICD-10-CM | POA: Diagnosis not present

## 2015-11-02 ENCOUNTER — Emergency Department: Payer: Commercial Managed Care - HMO

## 2015-11-02 ENCOUNTER — Emergency Department
Admission: EM | Admit: 2015-11-02 | Discharge: 2015-11-08 | Payer: Commercial Managed Care - HMO | Attending: Student in an Organized Health Care Education/Training Program | Admitting: Student in an Organized Health Care Education/Training Program

## 2015-11-02 DIAGNOSIS — Z87891 Personal history of nicotine dependence: Secondary | ICD-10-CM | POA: Diagnosis not present

## 2015-11-02 DIAGNOSIS — E876 Hypokalemia: Secondary | ICD-10-CM | POA: Insufficient documentation

## 2015-11-02 DIAGNOSIS — G9340 Encephalopathy, unspecified: Secondary | ICD-10-CM | POA: Insufficient documentation

## 2015-11-02 DIAGNOSIS — F1490 Cocaine use, unspecified, uncomplicated: Secondary | ICD-10-CM | POA: Insufficient documentation

## 2015-11-02 DIAGNOSIS — Z79899 Other long term (current) drug therapy: Secondary | ICD-10-CM | POA: Insufficient documentation

## 2015-11-02 DIAGNOSIS — Z0389 Encounter for observation for other suspected diseases and conditions ruled out: Secondary | ICD-10-CM | POA: Diagnosis not present

## 2015-11-02 DIAGNOSIS — R451 Restlessness and agitation: Secondary | ICD-10-CM

## 2015-11-02 DIAGNOSIS — F0391 Unspecified dementia with behavioral disturbance: Secondary | ICD-10-CM

## 2015-11-02 DIAGNOSIS — R569 Unspecified convulsions: Secondary | ICD-10-CM

## 2015-11-02 DIAGNOSIS — F0390 Unspecified dementia without behavioral disturbance: Secondary | ICD-10-CM

## 2015-11-02 DIAGNOSIS — F22 Delusional disorders: Secondary | ICD-10-CM | POA: Insufficient documentation

## 2015-11-02 DIAGNOSIS — I10 Essential (primary) hypertension: Secondary | ICD-10-CM | POA: Diagnosis not present

## 2015-11-02 DIAGNOSIS — F29 Unspecified psychosis not due to a substance or known physiological condition: Secondary | ICD-10-CM | POA: Diagnosis not present

## 2015-11-02 DIAGNOSIS — F918 Other conduct disorders: Secondary | ICD-10-CM | POA: Diagnosis present

## 2015-11-02 LAB — COMPREHENSIVE METABOLIC PANEL
ALBUMIN: 4 g/dL (ref 3.5–5.0)
ALT: 9 U/L — ABNORMAL LOW (ref 17–63)
ANION GAP: 11 (ref 5–15)
AST: 21 U/L (ref 15–41)
Alkaline Phosphatase: 47 U/L (ref 38–126)
BUN: 14 mg/dL (ref 6–20)
CHLORIDE: 102 mmol/L (ref 101–111)
CO2: 25 mmol/L (ref 22–32)
Calcium: 9 mg/dL (ref 8.9–10.3)
Creatinine, Ser: 1.62 mg/dL — ABNORMAL HIGH (ref 0.61–1.24)
GFR calc non Af Amer: 42 mL/min — ABNORMAL LOW (ref >60)
GFR, EST AFRICAN AMERICAN: 48 mL/min — AB (ref >60)
GLUCOSE: 123 mg/dL — AB (ref 65–99)
Potassium: 2.6 mmol/L — CL (ref 3.5–5.1)
SODIUM: 138 mmol/L (ref 135–145)
Total Bilirubin: 1.1 mg/dL (ref 0.3–1.2)
Total Protein: 7 g/dL (ref 6.5–8.1)

## 2015-11-02 LAB — CBC WITH DIFFERENTIAL/PLATELET
BASOS PCT: 0 %
Basophils Absolute: 0 10*3/uL (ref 0–0.1)
EOS ABS: 0 10*3/uL (ref 0–0.7)
EOS PCT: 0 %
HCT: 32.7 % — ABNORMAL LOW (ref 40.0–52.0)
Hemoglobin: 11.1 g/dL — ABNORMAL LOW (ref 13.0–18.0)
LYMPHS ABS: 0.7 10*3/uL — AB (ref 1.0–3.6)
Lymphocytes Relative: 10 %
MCH: 30.6 pg (ref 26.0–34.0)
MCHC: 34.1 g/dL (ref 32.0–36.0)
MCV: 89.5 fL (ref 80.0–100.0)
Monocytes Absolute: 0.7 10*3/uL (ref 0.2–1.0)
Monocytes Relative: 10 %
Neutro Abs: 6.2 10*3/uL (ref 1.4–6.5)
Neutrophils Relative %: 80 %
PLATELETS: 199 10*3/uL (ref 150–440)
RBC: 3.65 MIL/uL — AB (ref 4.40–5.90)
RDW: 15.6 % — ABNORMAL HIGH (ref 11.5–14.5)
WBC: 7.6 10*3/uL (ref 3.8–10.6)

## 2015-11-02 LAB — URINALYSIS COMPLETE WITH MICROSCOPIC (ARMC ONLY)
BACTERIA UA: NONE SEEN
Bilirubin Urine: NEGATIVE
Glucose, UA: NEGATIVE mg/dL
Ketones, ur: NEGATIVE mg/dL
LEUKOCYTES UA: NEGATIVE
NITRITE: NEGATIVE
PH: 6 (ref 5.0–8.0)
PROTEIN: NEGATIVE mg/dL
RBC / HPF: NONE SEEN RBC/hpf (ref 0–5)
SPECIFIC GRAVITY, URINE: 1.003 — AB (ref 1.005–1.030)
Squamous Epithelial / LPF: NONE SEEN

## 2015-11-02 LAB — URINE DRUG SCREEN, QUALITATIVE (ARMC ONLY)
Amphetamines, Ur Screen: NOT DETECTED
BARBITURATES, UR SCREEN: NOT DETECTED
BENZODIAZEPINE, UR SCRN: NOT DETECTED
CANNABINOID 50 NG, UR ~~LOC~~: NOT DETECTED
COCAINE METABOLITE, UR ~~LOC~~: NOT DETECTED
MDMA (Ecstasy)Ur Screen: NOT DETECTED
Methadone Scn, Ur: NOT DETECTED
Opiate, Ur Screen: NOT DETECTED
Phencyclidine (PCP) Ur S: NOT DETECTED
TRICYCLIC, UR SCREEN: NOT DETECTED

## 2015-11-02 LAB — TSH: TSH: 2.578 u[IU]/mL (ref 0.350–4.500)

## 2015-11-02 MED ORDER — LEVETIRACETAM 500 MG PO TABS
ORAL_TABLET | ORAL | Status: AC
  Filled 2015-11-02: qty 2

## 2015-11-02 MED ORDER — LORAZEPAM 1 MG PO TABS
ORAL_TABLET | ORAL | Status: AC
  Filled 2015-11-02: qty 1

## 2015-11-02 MED ORDER — ZIPRASIDONE MESYLATE 20 MG IM SOLR
INTRAMUSCULAR | Status: AC
  Administered 2015-11-02: 10 mg via INTRAMUSCULAR
  Filled 2015-11-02: qty 20

## 2015-11-02 MED ORDER — LEVETIRACETAM 500 MG PO TABS
500.0000 mg | ORAL_TABLET | Freq: Once | ORAL | Status: AC
  Administered 2015-11-03: 500 mg via ORAL
  Filled 2015-11-02: qty 1

## 2015-11-02 MED ORDER — LOSARTAN POTASSIUM 50 MG PO TABS
100.0000 mg | ORAL_TABLET | Freq: Every day | ORAL | Status: DC
  Administered 2015-11-04 – 2015-11-07 (×4): 100 mg via ORAL
  Filled 2015-11-02 (×5): qty 2

## 2015-11-02 MED ORDER — LEVETIRACETAM 750 MG PO TABS
750.0000 mg | ORAL_TABLET | Freq: Once | ORAL | Status: AC
Start: 2015-11-02 — End: 2015-11-02
  Administered 2015-11-02: 750 mg via ORAL
  Filled 2015-11-02: qty 1

## 2015-11-02 MED ORDER — POTASSIUM CHLORIDE CRYS ER 20 MEQ PO TBCR
EXTENDED_RELEASE_TABLET | ORAL | Status: AC
  Filled 2015-11-02: qty 2

## 2015-11-02 MED ORDER — ZIPRASIDONE MESYLATE 20 MG IM SOLR
10.0000 mg | Freq: Once | INTRAMUSCULAR | Status: AC
  Administered 2015-11-02: 10 mg via INTRAMUSCULAR
  Filled 2015-11-02: qty 20

## 2015-11-02 MED ORDER — ZIPRASIDONE MESYLATE 20 MG IM SOLR
10.0000 mg | Freq: Once | INTRAMUSCULAR | Status: AC
  Administered 2015-11-02: 10 mg via INTRAMUSCULAR

## 2015-11-02 MED ORDER — POTASSIUM CHLORIDE CRYS ER 20 MEQ PO TBCR
40.0000 meq | EXTENDED_RELEASE_TABLET | Freq: Once | ORAL | Status: AC
  Administered 2015-11-02: 40 meq via ORAL

## 2015-11-02 MED ORDER — HYDROCHLOROTHIAZIDE 25 MG PO TABS
12.5000 mg | ORAL_TABLET | Freq: Once | ORAL | Status: DC
  Filled 2015-11-02 (×2): qty 1

## 2015-11-02 MED ORDER — LORAZEPAM 1 MG PO TABS
1.0000 mg | ORAL_TABLET | Freq: Once | ORAL | Status: AC
  Administered 2015-11-02: 1 mg via ORAL

## 2015-11-02 MED ORDER — LORAZEPAM 1 MG PO TABS
1.0000 mg | ORAL_TABLET | Freq: Once | ORAL | Status: AC
  Administered 2015-11-02: 1 mg via ORAL
  Filled 2015-11-02: qty 1

## 2015-11-02 NOTE — ED Notes (Signed)
Unable to draw blood and do EKG at this time due to pt aggressive. Meds given. Will complete tasks once pt is calm.

## 2015-11-02 NOTE — ED Notes (Signed)
CT completed  Pt escorted by Ophelia Shoulder and [employer]

## 2015-11-02 NOTE — BH Assessment (Signed)
Tele Assessment Note   Jeremy [name] is an 70 y.o. male., African American who presents to [facility] per [facility] ED report: presents with brother due to acute paranoid and psychotic behavior. Patient has reportedly been not taking his medications, disappearing from home. She reportedly jumping over fences and hiding from neighbors with very bizarre behavior over the past several days. No report of any trauma. In the ER the patient is acutely agitated.  History limited due to acute encephalopathy. Patient during assessment was not very oriented, but able to acknowledge no SI or HI. Per MAR hx. has been seen at [facility] in 2016 Dr. Weber Cooks, not admitted and for similar complaints. Patient has hx. Of seizures and dementia. Patient unable to confirm most information. Patient denies current SI and HI, per MAR no real hx. Of SI. Patient unable to acknowledge current or hx. Of AVH. Unable to confirm S.A.hx. At this time. Per MAR no past inpatient psych hx. However, has hx. Of dementia/ bizarre behaviors and delusions. Unable to confirm outpatient tx. However, pt. Is seen a [facility] for seizures.   Patient is dressed in scrubs, upheaval appearance,  and is not oriented. Patient speech was slurred and soft. Motor behavior appeared normal, but exaggerated. Patient thought process is UTA. Patient does not appear to be responding to internal stimuli. Patient was cooperative throughout the assessment and only able to speak/ answer questions for short time frame.    Diagnosis:  293.0, [F05] Delirium due to another medical condition  Past Medical History:  Past Medical History:  Diagnosis Date  . Hypertension   . Seizures ([place])     Past Surgical History:  Procedure Laterality Date  . none      Family History: No family history on file.  Social History:  reports that he has quit smoking. He quit after 25.00 years of use. He does not have any smokeless tobacco history on file. He reports that he uses drugs, including  Cocaine and IV. He reports that he does not drink alcohol.  Additional Social History:  Alcohol / Drug Use Pain Medications: SEE MAR Prescriptions: SEE MAR Over the Counter: SEE MAR History of alcohol / drug use?: (S) No history of alcohol / drug abuse (UTA) Longest period of sobriety (when/how long): UTA  CIWA: CIWA-Ar BP: 116/66 Pulse Rate: (!) 103 COWS:    PATIENT STRENGTHS: (choose at least two) Active sense of humor Average or above average intelligence  Allergies: No Known Allergies  Home Medications:  (Not in a hospital admission)  OB/GYN Status:  No LMP for male patient.  General Assessment Data Location of Assessment: [facility] ED TTS Assessment: In system Is this a Tele or Face-to-Face Assessment?: Face-to-Face Is this an Initial Assessment or a Re-assessment for this encounter?: Initial Assessment Marital status: Other (comment) (UTA) Maiden name: n/a Is patient pregnant?: No Pregnancy Status: No Living Arrangements: Other relatives Can pt return to current living arrangement?: Yes (However, UTA) Admission Status: Involuntary Is patient capable of signing voluntary admission?: Yes Referral Source: Self/Family/Friend Insurance type: [employer]     Crisis Care Plan Living Arrangements: Other relatives Legal Guardian: Other: (Unknown, UTA) Name of Psychiatrist: [place] Name of Therapist: UTA  Education Status Is patient currently in school?: No Current Grade: n/A Highest grade of school patient has completed: [employer] Name of school: UTA Contact person: brother per [facility]  Risk to self with the past 6 months Suicidal Ideation: No Has patient been a risk to self within the past 6 months  prior to admission? : Other (comment) (pt hx. of dimentai per MAR, otherwise uknown) Suicidal Intent: No Has patient had any suicidal intent within the past 6 months prior to admission? : Other (comment) Is patient at risk for suicide?: No Suicidal Plan?: No Has patient had  any suicidal plan within the past 6 months prior to admission? : No Access to Means: No What has been your use of drugs/alcohol within the last 12 months?:  (UTA) Previous Attempts/Gestures: No (PER MAR no known) How many times?: 0 Other Self Harm Risks: none noted Triggers for Past Attempts: Unpredictable Intentional Self Injurious Behavior: None Family Suicide History: Unable to assess Recent stressful life event(s): Trauma (Comment), Turmoil (Comment), Other (Comment) (Dimentia hx.) Persecutory voices/beliefs?: No Depression: Yes Depression Symptoms: Insomnia (UTA, appeared in distress, depressed) Substance abuse history and/or treatment for substance abuse?: Yes Suicide prevention information given to non-admitted patients: Not applicable  Risk to Others within the past 6 months Homicidal Ideation: No Does patient have any lifetime risk of violence toward others beyond the six months prior to admission? : Unknown Thoughts of Harm to Others: No Current Homicidal Intent: No Current Homicidal Plan: No Access to Homicidal Means: No Identified Victim: none History of harm to others?: No Assessment of Violence: None Noted Violent Behavior Description: none known Does patient have access to weapons?: No Criminal Charges Pending?: No Does patient have a court date: No Is patient on probation?: No  Psychosis Hallucinations: Auditory, Visual (pt came in to ER with report of dimentia, bizarre behavior) Delusions: Grandiose (Dimentia. bizarre behaviors UTA extent)  Mental Status Report Appearance/Hygiene: Disheveled, In scrubs Eye Contact: Unable to Assess Motor Activity: Restlessness Speech: Unable to assess Level of Consciousness: Irritable, Sedated, Unable to assess Mood: Other (Comment) (UTA) Affect: Unable to Assess Anxiety Level: Severe (UTA) Thought Processes: Flight of Ideas, Thought Blocking Judgement: Impaired Orientation: Not oriented, Unable to assess Obsessive  Compulsive Thoughts/Behaviors: Unable to Assess  Cognitive Functioning Concentration: Unable to Assess Memory: Unable to Assess IQ: Average Insight: Unable to Assess Impulse Control: Unable to Assess Appetite: Fair Weight Loss: 0 Weight Gain: 0 Sleep: Unable to Assess Total Hours of Sleep:  (4) Vegetative Symptoms: Staying in bed, Decreased grooming  ADLScreening [facility] Assessment Services) Patient's cognitive ability adequate to safely complete daily activities?: No (UTA) Patient able to express need for assistance with ADLs?: Yes Independently performs ADLs?: Yes (appropriate for developmental age) (UTA fully)  Prior Inpatient Therapy Prior Inpatient Therapy: No (UTA fully, per [facility] no record) Prior Therapy Dates: n/a Prior Therapy Facilty/Provider(s): n/a Reason for Treatment: n/a  Prior Outpatient Therapy Prior Outpatient Therapy: Yes Prior Therapy Dates: current Prior Therapy Facilty/Provider(s): Per [facility] for seizures Reason for Treatment: seizures, dimentia Does patient have an ACCT team?: Unknown Does patient have Intensive In-House Services?  : Unknown Does patient have [facility] services? : Unknown Does patient have P4CC services?: Unknown  ADL Screening (condition at time of admission) Patient's cognitive ability adequate to safely complete daily activities?: No (UTA) Is the patient deaf or have difficulty hearing?: No Does the patient have difficulty seeing, even when wearing glasses/contacts?: No Does the patient have difficulty concentrating, remembering, or making decisions?: Yes (UTA fully) Patient able to express need for assistance with ADLs?: Yes Does the patient have difficulty dressing or bathing?: No (UTA fully) Independently performs ADLs?: Yes (appropriate for developmental age) (UTA fully) Does the patient have difficulty walking or climbing stairs?: Yes (UTA fully) Weakness of Legs:  (UTA) Weakness of Arms/Hands:  (UTA)  Abuse/Neglect  Assessment (Assessment to be complete while patient is alone) Physical Abuse:  (UTA) Verbal Abuse:  (UTA) Sexual Abuse:  (UTA) Exploitation of patient/patient's resources:  (UTA) Self-Neglect:  (UTA) Values / Beliefs Cultural Requests During Hospitalization:  (UTA) Spiritual Requests During Hospitalization:  (UTA)   Advance Directives (For Healthcare) Does patient have an advance directive?: No (UTA) Would patient like information on creating an advanced directive?: No - patient declined information    Additional Information 1:1 In Past 12 Months?: No CIRT Risk: Yes Elopement Risk: Yes Does patient have medical clearance?: No (in process)     Disposition: Per Dr. Weber Cooks meets inpatient criteria, recommends Ger-Psych. Disposition Initial Assessment Completed for this Encounter: Yes Disposition of Patient: Inpatient treatment program Type of inpatient treatment program: Adult (Ger Psych, hx. of seziures, dimentia)  Kristeen Mans 11/02/2015 3:05 PM

## 2015-11-02 NOTE — Consult Note (Signed)
[facility] Consult   Reason for Consult:  Consult for 70 year old man with a history of seizure disorder and progressive dementia brought in by family because of agitated behavior Referring Physician:  Quentin Cornwall Patient Identification: Jeremy [name] MRN:  [id number] Principal Diagnosis: Dementia with behavioral disturbance Diagnosis:   Patient Active Problem List   Diagnosis Date Noted  . Dementia with behavioral disturbance [F03.91] 11/02/2015  . Hypertension [I10] 11/02/2015  . Seizures ([place]) [R56.9] 11/02/2015  . Incarcerated hernia [K46.0]     Total Time spent with patient: 45 minutes  Subjective:   Jeremy [name] is a 70 y.o. male patient admitted with "what are you doing?".  HPI:  Patient seen chart reviewed. Attempted interview patient but he is agitated and confused and not really able to participate. Spoke to his brother who was accompanying him. 29 year old man with a known history of seizure disorder. Family reports that today he was out wandering around in the street not dressed appropriately. To be very confused. When we saw him in the emergency room the patient was clearly very confused. Combative with staff. Had no idea where he was. Not able to answer questions appropriately. There is no report that there was any witnessed seizure. Brother reports a belief that the patient has not been taking his medicine and has not been taking care of himself well. He is concerned about his ability to live independently.  Social history: Reportedly the patient lives independently although there is a woman who lives with him full or part-time.  Medical history: Established history of seizure disorder. Followed up at [facility] by the seizure clinic. Hypertension. History of a hernia.  Substance abuse history: Unknown. Patient not able to answer questions were we don't have any evidence or clear history of recent alcohol or drug abuse  Past Psychiatric History:  Patient has been mostly treated for seizures previously but has been evaluated that he gets very confused and seems to be getting progressively demented with poor self-care and poor memory. No evidence or history of suicidality no known previous psychiatric hospitalization.  Risk to Self: Suicidal Ideation: No Suicidal Intent: No Is patient at risk for suicide?: No Suicidal Plan?: No Access to Means: No What has been your use of drugs/alcohol within the last 12 months?:  (UTA) How many times?: 0 Other Self Harm Risks: none noted Triggers for Past Attempts: Unpredictable Intentional Self Injurious Behavior: None Risk to Others: Homicidal Ideation: No Thoughts of Harm to Others: No Current Homicidal Intent: No Current Homicidal Plan: No Access to Homicidal Means: No Identified Victim: none History of harm to others?: No Assessment of Violence: None Noted Violent Behavior Description: none known Does patient have access to weapons?: No Criminal Charges Pending?: No Does patient have a court date: No Prior Inpatient Therapy: Prior Inpatient Therapy: No (UTA fully, per [facility] no record) Prior Therapy Dates: n/a Prior Therapy Facilty/Provider(s): n/a Reason for Treatment: n/a Prior Outpatient Therapy: Prior Outpatient Therapy: Yes Prior Therapy Dates: current Prior Therapy Facilty/Provider(s): Per [facility] for seizures Reason for Treatment: seizures, dimentia Does patient have an ACCT team?: Unknown Does patient have Intensive In-House Services?  : Unknown Does patient have [facility] services? : Unknown Does patient have P4CC services?: Unknown  Past Medical History:  Past Medical History:  Diagnosis Date  . Hypertension   . Seizures ([place])     Past Surgical History:  Procedure Laterality Date  . none     Family History: No family history on file. Family Psychiatric  History: No known family history of mental illness Social History:  History  Alcohol Use No    Comment: quit  etoh x 3 months ago     History  Drug Use  . Types: Cocaine, IV    Comment: quit 3 months ago - x 17 year drug and alcohol use    Social History   Social History  . Marital status: Single    Spouse name: N/A  . Number of children: N/A  . Years of education: N/A   Social History Main Topics  . Smoking status: Former Smoker    Years: 25.00  . Smokeless tobacco: None  . Alcohol use No     Comment: quit etoh x 3 months ago  . Drug use:     Types: Cocaine, IV     Comment: quit 3 months ago - x 17 year drug and alcohol use  . Sexual activity: Not Asked   Other Topics Concern  . None   Social History Narrative  . None   Additional Social History:    Allergies:  No Known Allergies  Labs:  Results for orders placed or performed during the hospital encounter of 11/02/15 (from the past 48 hour(s))  Urinalysis complete, with microscopic ([facility] only)     Status: Abnormal   Collection Time: 11/02/15  2:41 PM  Result Value Ref Range   Color, Urine STRAW (A) YELLOW   APPearance CLEAR (A) CLEAR   Glucose, UA NEGATIVE NEGATIVE mg/dL   Bilirubin Urine NEGATIVE NEGATIVE   Ketones, ur NEGATIVE NEGATIVE mg/dL   Specific Gravity, Urine 1.003 (L) 1.005 - 1.030   Hgb urine dipstick 1+ (A) NEGATIVE   pH 6.0 5.0 - 8.0   Protein, ur NEGATIVE NEGATIVE mg/dL   Nitrite NEGATIVE NEGATIVE   Leukocytes, UA NEGATIVE NEGATIVE   RBC / HPF NONE SEEN 0 - 5 RBC/hpf   WBC, UA 0-5 0 - 5 WBC/hpf   Bacteria, UA NONE SEEN NONE SEEN   Squamous Epithelial / LPF NONE SEEN NONE SEEN   Mucous PRESENT   Urine Drug Screen, Qualitative ([facility] only)     Status: None   Collection Time: 11/02/15  2:41 PM  Result Value Ref Range   Tricyclic, Ur Screen NONE DETECTED NONE DETECTED   Amphetamines, Ur Screen NONE DETECTED NONE DETECTED   MDMA (Ecstasy)Ur Screen NONE DETECTED NONE DETECTED   Cocaine Metabolite,Ur [place] NONE DETECTED NONE DETECTED   Opiate, Ur Screen NONE DETECTED NONE DETECTED   Phencyclidine (PCP)  Ur S NONE DETECTED NONE DETECTED   Cannabinoid 50 Ng, Ur [place] NONE DETECTED NONE DETECTED   Barbiturates, Ur Screen NONE DETECTED NONE DETECTED   Benzodiazepine, Ur Scrn NONE DETECTED NONE DETECTED   Methadone Scn, Ur NONE DETECTED NONE DETECTED    Comment: (NOTE) 032  Tricyclics, urine               Cutoff 1000 ng/mL 200  Amphetamines, urine             Cutoff 1000 ng/mL 300  MDMA (Ecstasy), urine           Cutoff 500 ng/mL 400  Cocaine Metabolite, urine       Cutoff 300 ng/mL 500  Opiate, urine                   Cutoff 300 ng/mL 600  Phencyclidine (PCP), urine      Cutoff 25 ng/mL 700  Cannabinoid, urine  Cutoff 50 ng/mL 800  Barbiturates, urine             Cutoff 200 ng/mL 900  Benzodiazepine, urine           Cutoff 200 ng/mL 1000 Methadone, urine                Cutoff 300 ng/mL 1100 1200 The urine drug screen provides only a preliminary, unconfirmed 1300 analytical test result and should not be used for non-medical 1400 purposes. Clinical consideration and professional judgment should 1500 be applied to any positive drug screen result due to possible 1600 interfering substances. A more specific alternate chemical method 1700 must be used in order to obtain a confirmed analytical result.  1800 Gas chromato graphy / mass spectrometry (GC/MS) is the preferred 1900 confirmatory method.   CBC with Differential/Platelet     Status: Abnormal   Collection Time: 11/02/15  2:42 PM  Result Value Ref Range   WBC 7.6 3.8 - 10.6 K/uL   RBC 3.65 (L) 4.40 - 5.90 MIL/uL   Hemoglobin 11.1 (L) 13.0 - 18.0 g/dL   HCT 32.7 (L) 40.0 - 52.0 %   MCV 89.5 80.0 - 100.0 fL   MCH 30.6 26.0 - 34.0 pg   MCHC 34.1 32.0 - 36.0 g/dL   RDW 15.6 (H) 11.5 - 14.5 %   Platelets 199 150 - 440 K/uL   Neutrophils Relative % 80 %   Neutro Abs 6.2 1.4 - 6.5 K/uL   Lymphocytes Relative 10 %   Lymphs Abs 0.7 (L) 1.0 - 3.6 K/uL   Monocytes Relative 10 %   Monocytes Absolute 0.7 0.2 - 1.0 K/uL    Eosinophils Relative 0 %   Eosinophils Absolute 0.0 0 - 0.7 K/uL   Basophils Relative 0 %   Basophils Absolute 0.0 0 - 0.1 K/uL  Comprehensive metabolic panel     Status: Abnormal   Collection Time: 11/02/15  2:42 PM  Result Value Ref Range   Sodium 138 135 - 145 mmol/L   Potassium 2.6 (LL) 3.5 - 5.1 mmol/L    Comment: CRITICAL RESULT CALLED TO, READ BACK BY AND VERIFIED WITH AMY TEAGUEE RN AT 0254 11/02/15 MSS.    Chloride 102 101 - 111 mmol/L   CO2 25 22 - 32 mmol/L   Glucose, Bld 123 (H) 65 - 99 mg/dL   BUN 14 6 - 20 mg/dL   Creatinine, Ser 1.62 (H) 0.61 - 1.24 mg/dL   Calcium 9.0 8.9 - 10.3 mg/dL   Total Protein 7.0 6.5 - 8.1 g/dL   Albumin 4.0 3.5 - 5.0 g/dL   AST 21 15 - 41 U/L   ALT 9 (L) 17 - 63 U/L   Alkaline Phosphatase 47 38 - 126 U/L   Total Bilirubin 1.1 0.3 - 1.2 mg/dL   GFR calc non Af Amer 42 (L) >60 mL/min   GFR calc Af Amer 48 (L) >60 mL/min    Comment: (NOTE) The eGFR has been calculated using the CKD EPI equation. This calculation has not been validated in all clinical situations. eGFR's persistently <60 mL/min signify possible Chronic Kidney Disease.    Anion gap 11 5 - 15  TSH     Status: None   Collection Time: 11/02/15  2:42 PM  Result Value Ref Range   TSH 2.578 0.350 - 4.500 uIU/mL    Current Facility-Administered Medications  Medication Dose Route Frequency Provider Last Rate Last Dose  . potassium chloride SA (K-DUR,KLOR-CON) 20 MEQ CR  tablet            Current Outpatient Prescriptions  Medication Sig Dispense Refill  . levETIRAcetam (KEPPRA) 500 MG tablet Take 500-750 mg by mouth 2 (two) times daily. Take 1 tablet every morning and take 1-1/2 tablets every evening.    Marland Kitchen losartan-hydrochlorothiazide (HYZAAR) 100-12.5 MG tablet Take 1 tablet by mouth daily.      Musculoskeletal: Strength & Muscle Tone: decreased Gait & Station: ataxic Patient leans: Left  Psychiatric Specialty Exam: Physical Exam  Nursing note and vitals  reviewed. Constitutional: He appears cachectic. He appears distressed.  HENT:  Head: Normocephalic and atraumatic.  Eyes: Conjunctivae are normal. Pupils are equal, round, and reactive to light.  Neck: Normal range of motion.  Cardiovascular: Regular rhythm and normal heart sounds.   Respiratory: Effort normal. No respiratory distress.  GI: Soft.  Musculoskeletal: Normal range of motion.  Neurological: He is alert.  Skin: Skin is warm and dry.  Psychiatric: His affect is labile and inappropriate. His speech is tangential and slurred. He is agitated. Thought content is paranoid. He expresses impulsivity. He expresses no homicidal and no suicidal ideation. He exhibits abnormal recent memory and abnormal remote memory.    Review of Systems  Unable to perform ROS: Mental acuity    Blood pressure 116/66, pulse (!) 103, temperature 98.5 F (36.9 C), temperature source Oral, resp. rate 14, SpO2 97 %.There is no height or weight on file to calculate BMI.  General Appearance: Disheveled  Eye Contact:  Minimal  Speech:  Garbled  Volume:  Decreased  Mood:  Angry and Anxious  Affect:  Congruent, Inappropriate and Labile  Thought Process:  Disorganized and Irrelevant  Orientation:  Negative  Thought Content:  Illogical and Paranoid Ideation  Suicidal Thoughts:  No  Homicidal Thoughts:  No  Memory:  Immediate;   Fair Recent;   Poor Remote;   Poor  Judgement:  Poor  Insight:  Lacking  Psychomotor Activity:  Decreased  Concentration:  Concentration: Poor  Recall:  Poor  Fund of Knowledge:  Fair  Language:  Fair  Akathisia:  No  Handed:  Right  AIMS (if indicated):     Assets:  Social Support  ADL's:  Impaired  Cognition:  Impaired,  Moderate  Sleep:        Treatment Plan Summary: Daily contact with patient to assess and evaluate symptoms and progress in treatment, Medication management and Plan 70 year old man very confused with possible delirium. Unclear if it could be postictal.  Evidence of worsening dementia. Evidence of poor self-care at home. Patient is currently very confused and unable to engage in any further assessment. We will reevaluate as time goes by to see any of this clears up. Meanwhile I have  requested TTS 2 refer the patient to geriatric psychiatry services.  Disposition: Supportive therapy provided about ongoing stressors. Discussed crisis plan, support from social network, calling 911, coming to the Emergency Department, and calling Suicide Hotline.  Alethia Berthold, MD 11/02/2015 6:14 PM

## 2015-11-02 NOTE — ED Provider Notes (Signed)
[facility] Emergency Department Provider Note    First MD Initiated Contact with Patient 11/02/15 1236     (approximate)  I have reviewed the triage vital signs and the nursing notes.   HISTORY  Chief Complaint Aggressive Behavior    HPI Jeremy [name] is a 70 y.o. male presents with brother due to acute paranoid and psychotic behavior. Patient has reportedly been not taking his medications, disappearing from home. She reportedly jumping over fences and hiding from neighbors with very bizarre behavior over the past several days. No report of any trauma. In the ER the patient is acutely agitated.  History limited due to acute encephalopathy   Past Medical History:  Diagnosis Date  . Hypertension   . Seizures [facility])     Patient Active Problem List   Diagnosis Date Noted  . Incarcerated hernia     Past Surgical History:  Procedure Laterality Date  . none      Prior to Admission medications   Medication Sig Start Date End Date Taking? Authorizing Provider  levETIRAcetam (KEPPRA) 500 MG tablet Take 500-750 mg by mouth 2 (two) times daily. Take 1 tablet every morning and take 1-1/2 tablets every evening.   Yes Historical Provider, MD  losartan-hydrochlorothiazide (HYZAAR) 100-12.5 MG tablet Take 1 tablet by mouth daily.   Yes Historical Provider, MD    Allergies Review of patient's allergies indicates no known allergies.  [place]:  No bleeding disorders.  Social History Social History  Substance Use Topics  . Smoking status: Former Smoker    Years: 25.00  . Smokeless tobacco: Not on file  . Alcohol use No     Comment: quit etoh x 3 months ago    Review of Systems Patient denies headaches, rhinorrhea, blurry vision, numbness, shortness of breath, chest pain, edema, cough, abdominal pain, nausea, vomiting, diarrhea, dysuria, fevers, rashes or hallucinations unless otherwise stated above in  HPI. ____________________________________________   PHYSICAL EXAM:  VITAL SIGNS: Vitals:   11/02/15 1357 11/02/15 1448  BP: 116/66   Pulse: (!) 103   Resp: 14   Temp:  98.5 F (36.9 C)    Constitutional: Disheveled and acutely encephalopathic Eyes: Conjunctivae are normal. PERRL. EOMI. Head: Atraumatic. Nose: No congestion/rhinnorhea. Mouth/Throat: Mucous membranes are dry.  Oropharynx non-erythematous. Neck: No stridor. Painless ROM. No cervical spine tenderness to palpation Hematological/Lymphatic/Immunilogical: No cervical lymphadenopathy. Cardiovascular: Mildly tachycardic, regular rhythm. Grossly normal heart sounds.  Good peripheral circulation. Respiratory: Normal respiratory effort.  No retractions. Lungs CTAB. Gastrointestinal: Soft and nontender. No distention. No abdominal bruits. No CVA tenderness.  Musculoskeletal: No lower extremity tenderness nor edema.  No joint effusions. Neurologic:  Normal speech and language. No gross focal neurologic deficits are appreciated. No gait instability.  Skin:  Skin is warm, dry and intact. No rash noted. Psychiatric: Agitated ____________________________________________   LABS (all labs ordered are listed, but only abnormal results are displayed)  Results for orders placed or performed during the hospital encounter of 11/02/15 (from the past 24 hour(s))  Urinalysis complete, with microscopic ([facility] only)     Status: Abnormal   Collection Time: 11/02/15  2:41 PM  Result Value Ref Range   Color, Urine STRAW (A) YELLOW   APPearance CLEAR (A) CLEAR   Glucose, UA NEGATIVE NEGATIVE mg/dL   Bilirubin Urine NEGATIVE NEGATIVE   Ketones, ur NEGATIVE NEGATIVE mg/dL   Specific Gravity, Urine 1.003 (L) 1.005 - 1.030   Hgb urine dipstick 1+ (A) NEGATIVE   pH 6.0 5.0 -  8.0   Protein, ur NEGATIVE NEGATIVE mg/dL   Nitrite NEGATIVE NEGATIVE   Leukocytes, UA NEGATIVE NEGATIVE   RBC / HPF NONE SEEN 0 - 5 RBC/hpf   WBC, UA 0-5 0 - 5  WBC/hpf   Bacteria, UA NONE SEEN NONE SEEN   Squamous Epithelial / LPF NONE SEEN NONE SEEN   Mucous PRESENT   Urine Drug Screen, Qualitative ([facility] only)     Status: None   Collection Time: 11/02/15  2:41 PM  Result Value Ref Range   Tricyclic, Ur Screen NONE DETECTED NONE DETECTED   Amphetamines, Ur Screen NONE DETECTED NONE DETECTED   MDMA (Ecstasy)Ur Screen NONE DETECTED NONE DETECTED   Cocaine Metabolite,Ur [place] NONE DETECTED NONE DETECTED   Opiate, Ur Screen NONE DETECTED NONE DETECTED   Phencyclidine (PCP) Ur S NONE DETECTED NONE DETECTED   Cannabinoid 50 Ng, Ur [place] NONE DETECTED NONE DETECTED   Barbiturates, Ur Screen NONE DETECTED NONE DETECTED   Benzodiazepine, Ur Scrn NONE DETECTED NONE DETECTED   Methadone Scn, Ur NONE DETECTED NONE DETECTED  CBC with Differential/Platelet     Status: Abnormal   Collection Time: 11/02/15  2:42 PM  Result Value Ref Range   WBC 7.6 3.8 - 10.6 K/uL   RBC 3.65 (L) 4.40 - 5.90 MIL/uL   Hemoglobin 11.1 (L) 13.0 - 18.0 g/dL   HCT 32.7 (L) 40.0 - 52.0 %   MCV 89.5 80.0 - 100.0 fL   MCH 30.6 26.0 - 34.0 pg   MCHC 34.1 32.0 - 36.0 g/dL   RDW 15.6 (H) 11.5 - 14.5 %   Platelets 199 150 - 440 K/uL   Neutrophils Relative % 80 %   Neutro Abs 6.2 1.4 - 6.5 K/uL   Lymphocytes Relative 10 %   Lymphs Abs 0.7 (L) 1.0 - 3.6 K/uL   Monocytes Relative 10 %   Monocytes Absolute 0.7 0.2 - 1.0 K/uL   Eosinophils Relative 0 %   Eosinophils Absolute 0.0 0 - 0.7 K/uL   Basophils Relative 0 %   Basophils Absolute 0.0 0 - 0.1 K/uL  Comprehensive metabolic panel     Status: Abnormal   Collection Time: 11/02/15  2:42 PM  Result Value Ref Range   Sodium 138 135 - 145 mmol/L   Potassium 2.6 (LL) 3.5 - 5.1 mmol/L   Chloride 102 101 - 111 mmol/L   CO2 25 22 - 32 mmol/L   Glucose, Bld 123 (H) 65 - 99 mg/dL   BUN 14 6 - 20 mg/dL   Creatinine, Ser 1.62 (H) 0.61 - 1.24 mg/dL   Calcium 9.0 8.9 - 10.3 mg/dL   Total Protein 7.0 6.5 - 8.1 g/dL   Albumin 4.0 3.5 - 5.0  g/dL   AST 21 15 - 41 U/L   ALT 9 (L) 17 - 63 U/L   Alkaline Phosphatase 47 38 - 126 U/L   Total Bilirubin 1.1 0.3 - 1.2 mg/dL   GFR calc non Af Amer 42 (L) >60 mL/min   GFR calc Af Amer 48 (L) >60 mL/min   Anion gap 11 5 - 15   ____________________________________________  EKG____________________________________________  RADIOLOGY  CXR my read shows no evidence of acute cardiopulmonary process.  ____________________________________________   PROCEDURES  Procedure(s) performed: none    Critical Care performed: no ____________________________________________   INITIAL IMPRESSION / ASSESSMENT AND PLAN / ED COURSE  Pertinent labs & imaging results that were available during my care of the patient were reviewed by me and considered in  my medical decision making (see chart for details).  DDX: Dehydration, psychosis, dementia sepsis, pna, uti, hypoglycemia, cva, drug effect, withdrawal, encephalitis   CAYCEN NODAL is a 70 y.o. who presents to the ED with acute agitation and paranoid psychosis. Patient afebrile mildly tachycardic. Does appear dehydrated. No evidence of external trauma but history limited due to the patient's encephalopathy. He is on history or do suspect severe dementia with psychotic features but based on patient's age will evaluate laboratory tests as well as diagnostic imaging to evaluate for any underlying organic pathology.  The patient will be placed on continuous pulse oximetry and telemetry for monitoring.  Laboratory evaluation will be sent to evaluate for the above complaints.     Clinical Course    ----------------------------------------- 3:30 PM on 11/02/2015 -----------------------------------------  Chest x-ray without any acute abnormality. Patient does have acute hypokalemia. Renal function otherwise roughly at baseline. Will attempt oral replacement and hydration due to the patient's agitation in my concern for worsening arm with IV  treatment. Currently awaiting CT imaging. Patient will be signed out to Dr. Alfred Levins pending complete diagnostic test results a medical clearance for probable psychiatric placement.   ____________________________________________   FINAL CLINICAL IMPRESSION(S) / ED DIAGNOSES  Final diagnoses:  Agitation  Paranoia (psychosis) ([place])  Hypokalemia      NEW MEDICATIONS STARTED DURING THIS VISIT:  New Prescriptions   No medications on file     Note:  This document was prepared using Dragon voice recognition software and may include unintentional dictation errors.    Merlyn Lot, MD 11/02/15 [phone]

## 2015-11-02 NOTE — ED Notes (Signed)
PT IVC PENDING PSYCH CONSULT. 

## 2015-11-02 NOTE — ED Notes (Signed)
BEHAVIORAL HEALTH ROUNDING Patient sleeping: Yes.   Patient alert and oriented: eyes closed  Appears to be asleep Behavior appropriate: Yes.  ; If no, describe:  Nutrition and fluids offered: Yes  Toileting and hygiene offered: sleeping Sitter present: q 15 minute observations and security monitoring Law enforcement present: yes  ODS 

## 2015-11-02 NOTE — ED Notes (Signed)

## 2015-11-02 NOTE — ED Triage Notes (Signed)
Pt came to ED, brought in by brother. Reports pt was brought to brother's door step this morning. Pt has been wandering out of his house, jumping over neighbors fence, more confused where he is. No mental health diagnosis. Takes losartan and keppra. Pt aggressive on arrival.

## 2015-11-03 DIAGNOSIS — F0391 Unspecified dementia with behavioral disturbance: Secondary | ICD-10-CM | POA: Diagnosis not present

## 2015-11-03 NOTE — ED Notes (Signed)
PT IVC PENDING PSYCH CONSULT. 

## 2015-11-03 NOTE — ED Notes (Signed)
Patient ambulated  to restroom without assistance.

## 2015-11-03 NOTE — Consult Note (Signed)
[place] [facility] Consult   Reason for Consult:  Consult for 70 year old man with a history of seizure disorder and progressive dementia brought in by family because of agitated behavior Referring Physician:  Quentin Cornwall Patient Identification: Jeremy [name] MRN:  [id number] Principal Diagnosis: Dementia with behavioral disturbance Diagnosis:   Patient Active Problem List   Diagnosis Date Noted  . Dementia with behavioral disturbance [F03.91] 11/02/2015  . Hypertension [I10] 11/02/2015  . Seizures ([place]) [R56.9] 11/02/2015  . Incarcerated hernia [K46.0]     Total Time spent with patient: 30 minutes  Subjective:   Jeremy [name] is a 70 y.o. male patient admitted with "what are you doing?".  Follow-up for Saturday the 27th. 70 year old man with a history of seizure disorder. Patient interviewed today. He seems significantly better. He was not delirious and he was able to engage in appropriate conversations. He was oriented to place and year. He didn't have a clear memory of coming into the hospital yesterday. Patient admits that he is not always fully compliant with his medicine because he doesn't like how it makes him feel. He denies that he's been drinking or using any drugs. He was not hostile or threatening or argumentative with me. Still impaired as far as short-term memory.  HPI:  Patient seen chart reviewed. Attempted interview patient but he is agitated and confused and not really able to participate. Spoke to his brother who was accompanying him. 48 year old man with a known history of seizure disorder. Family reports that today he was out wandering around in the street not dressed appropriately. To be very confused. When we saw him in the emergency room the patient was clearly very confused. Combative with staff. Had no idea where he was. Not able to answer questions appropriately. There is no report that there was any witnessed seizure. Brother reports a belief  that the patient has not been taking his medicine and has not been taking care of himself well. He is concerned about his ability to live independently.  Social history: Reportedly the patient lives independently although there is a woman who lives with him full or part-time.  Medical history: Established history of seizure disorder. Followed up at [facility] by the seizure clinic. Hypertension. History of a hernia.  Substance abuse history: Unknown. Patient not able to answer questions were we don't have any evidence or clear history of recent alcohol or drug abuse  Past Psychiatric History: Patient has been mostly treated for seizures previously but has been evaluated that he gets very confused and seems to be getting progressively demented with poor self-care and poor memory. No evidence or history of suicidality no known previous psychiatric hospitalization.  Risk to Self: Suicidal Ideation: No Suicidal Intent: No Is patient at risk for suicide?: No Suicidal Plan?: No Access to Means: No What has been your use of drugs/alcohol within the last 12 months?:  (UTA) How many times?: 0 Other Self Harm Risks: none noted Triggers for Past Attempts: Unpredictable Intentional Self Injurious Behavior: None Risk to Others: Homicidal Ideation: No Thoughts of Harm to Others: No Current Homicidal Intent: No Current Homicidal Plan: No Access to Homicidal Means: No Identified Victim: none History of harm to others?: No Assessment of Violence: None Noted Violent Behavior Description: none known Does patient have access to weapons?: No Criminal Charges Pending?: No Does patient have a court date: No Prior Inpatient Therapy: Prior Inpatient Therapy: No (UTA fully, per [facility] no record) Prior Therapy Dates: n/a Prior Therapy Facilty/Provider(s): n/a Reason for  Treatment: n/a Prior Outpatient Therapy: Prior Outpatient Therapy: Yes Prior Therapy Dates: current Prior Therapy Facilty/Provider(s): Per [facility]  for seizures Reason for Treatment: seizures, dimentia Does patient have an ACCT team?: Unknown Does patient have Intensive In-House Services?  : Unknown Does patient have [facility] services? : Unknown Does patient have P4CC services?: Unknown  Past Medical History:  Past Medical History:  Diagnosis Date  . Hypertension   . Seizures ([place])     Past Surgical History:  Procedure Laterality Date  . none     Family History: No family history on file. Family Psychiatric  History: No known family history of mental illness Social History:  History  Alcohol Use No    Comment: quit etoh x 3 months ago     History  Drug Use  . Types: Cocaine, IV    Comment: quit 3 months ago - x 17 year drug and alcohol use    Social History   Social History  . Marital status: Single    Spouse name: N/A  . Number of children: N/A  . Years of education: N/A   Social History Main Topics  . Smoking status: Former Smoker    Years: 25.00  . Smokeless tobacco: None  . Alcohol use No     Comment: quit etoh x 3 months ago  . Drug use:     Types: Cocaine, IV     Comment: quit 3 months ago - x 17 year drug and alcohol use  . Sexual activity: Not Asked   Other Topics Concern  . None   Social History Narrative  . None   Additional Social History:    Allergies:  No Known Allergies  Labs:  Results for orders placed or performed during the hospital encounter of 11/02/15 (from the past 48 hour(s))  Urinalysis complete, with microscopic ([facility] only)     Status: Abnormal   Collection Time: 11/02/15  2:41 PM  Result Value Ref Range   Color, Urine STRAW (A) YELLOW   APPearance CLEAR (A) CLEAR   Glucose, UA NEGATIVE NEGATIVE mg/dL   Bilirubin Urine NEGATIVE NEGATIVE   Ketones, ur NEGATIVE NEGATIVE mg/dL   Specific Gravity, Urine 1.003 (L) 1.005 - 1.030   Hgb urine dipstick 1+ (A) NEGATIVE   pH 6.0 5.0 - 8.0   Protein, ur NEGATIVE NEGATIVE mg/dL   Nitrite NEGATIVE NEGATIVE   Leukocytes, UA  NEGATIVE NEGATIVE   RBC / HPF NONE SEEN 0 - 5 RBC/hpf   WBC, UA 0-5 0 - 5 WBC/hpf   Bacteria, UA NONE SEEN NONE SEEN   Squamous Epithelial / LPF NONE SEEN NONE SEEN   Mucous PRESENT   Urine Drug Screen, Qualitative ([facility] only)     Status: None   Collection Time: 11/02/15  2:41 PM  Result Value Ref Range   Tricyclic, Ur Screen NONE DETECTED NONE DETECTED   Amphetamines, Ur Screen NONE DETECTED NONE DETECTED   MDMA (Ecstasy)Ur Screen NONE DETECTED NONE DETECTED   Cocaine Metabolite,Ur [place] NONE DETECTED NONE DETECTED   Opiate, Ur Screen NONE DETECTED NONE DETECTED   Phencyclidine (PCP) Ur S NONE DETECTED NONE DETECTED   Cannabinoid 50 Ng, Ur [place] NONE DETECTED NONE DETECTED   Barbiturates, Ur Screen NONE DETECTED NONE DETECTED   Benzodiazepine, Ur Scrn NONE DETECTED NONE DETECTED   Methadone Scn, Ur NONE DETECTED NONE DETECTED    Comment: (NOTE) 088  Tricyclics, urine               Cutoff  1000 ng/mL 200  Amphetamines, urine             Cutoff 1000 ng/mL 300  MDMA (Ecstasy), urine           Cutoff 500 ng/mL 400  Cocaine Metabolite, urine       Cutoff 300 ng/mL 500  Opiate, urine                   Cutoff 300 ng/mL 600  Phencyclidine (PCP), urine      Cutoff 25 ng/mL 700  Cannabinoid, urine              Cutoff 50 ng/mL 800  Barbiturates, urine             Cutoff 200 ng/mL 900  Benzodiazepine, urine           Cutoff 200 ng/mL 1000 Methadone, urine                Cutoff 300 ng/mL 1100 1200 The urine drug screen provides only a preliminary, unconfirmed 1300 analytical test result and should not be used for non-medical 1400 purposes. Clinical consideration and professional judgment should 1500 be applied to any positive drug screen result due to possible 1600 interfering substances. A more specific alternate chemical method 1700 must be used in order to obtain a confirmed analytical result.  1800 Gas chromato graphy / mass spectrometry (GC/MS) is the preferred 1900 confirmatory method.    CBC with Differential/Platelet     Status: Abnormal   Collection Time: 11/02/15  2:42 PM  Result Value Ref Range   WBC 7.6 3.8 - 10.6 K/uL   RBC 3.65 (L) 4.40 - 5.90 MIL/uL   Hemoglobin 11.1 (L) 13.0 - 18.0 g/dL   HCT 32.7 (L) 40.0 - 52.0 %   MCV 89.5 80.0 - 100.0 fL   MCH 30.6 26.0 - 34.0 pg   MCHC 34.1 32.0 - 36.0 g/dL   RDW 15.6 (H) 11.5 - 14.5 %   Platelets 199 150 - 440 K/uL   Neutrophils Relative % 80 %   Neutro Abs 6.2 1.4 - 6.5 K/uL   Lymphocytes Relative 10 %   Lymphs Abs 0.7 (L) 1.0 - 3.6 K/uL   Monocytes Relative 10 %   Monocytes Absolute 0.7 0.2 - 1.0 K/uL   Eosinophils Relative 0 %   Eosinophils Absolute 0.0 0 - 0.7 K/uL   Basophils Relative 0 %   Basophils Absolute 0.0 0 - 0.1 K/uL  Comprehensive metabolic panel     Status: Abnormal   Collection Time: 11/02/15  2:42 PM  Result Value Ref Range   Sodium 138 135 - 145 mmol/L   Potassium 2.6 (LL) 3.5 - 5.1 mmol/L    Comment: CRITICAL RESULT CALLED TO, READ BACK BY AND VERIFIED WITH AMY TEAGUEE RN AT 0370 11/02/15 MSS.    Chloride 102 101 - 111 mmol/L   CO2 25 22 - 32 mmol/L   Glucose, Bld 123 (H) 65 - 99 mg/dL   BUN 14 6 - 20 mg/dL   Creatinine, Ser 1.62 (H) 0.61 - 1.24 mg/dL   Calcium 9.0 8.9 - 10.3 mg/dL   Total Protein 7.0 6.5 - 8.1 g/dL   Albumin 4.0 3.5 - 5.0 g/dL   AST 21 15 - 41 U/L   ALT 9 (L) 17 - 63 U/L   Alkaline Phosphatase 47 38 - 126 U/L   Total Bilirubin 1.1 0.3 - 1.2 mg/dL   GFR calc non Af Amer 42 (L) >60  mL/min   GFR calc Af Amer 48 (L) >60 mL/min    Comment: (NOTE) The eGFR has been calculated using the CKD EPI equation. This calculation has not been validated in all clinical situations. eGFR's persistently <60 mL/min signify possible Chronic Kidney Disease.    Anion gap 11 5 - 15  TSH     Status: None   Collection Time: 11/02/15  2:42 PM  Result Value Ref Range   TSH 2.578 0.350 - 4.500 uIU/mL    Current Facility-Administered Medications  Medication Dose Route Frequency  Provider Last Rate Last Dose  . hydrochlorothiazide (HYDRODIURIL) tablet 12.5 mg  12.5 mg Oral Once Merlyn Lot, MD      . losartan (COZAAR) tablet 100 mg  100 mg Oral Daily Merlyn Lot, MD       Current Outpatient Prescriptions  Medication Sig Dispense Refill  . levETIRAcetam (KEPPRA) 500 MG tablet Take 500-750 mg by mouth 2 (two) times daily. Take 1 tablet every morning and take 1-1/2 tablets every evening.    Marland Kitchen losartan-hydrochlorothiazide (HYZAAR) 100-12.5 MG tablet Take 1 tablet by mouth daily.      Musculoskeletal: Strength & Muscle Tone: decreased Gait & Station: ataxic Patient leans: Left  Psychiatric Specialty Exam: Physical Exam  Nursing note and vitals reviewed. Constitutional: He appears cachectic. No distress.  HENT:  Head: Normocephalic and atraumatic.  Eyes: Conjunctivae are normal. Pupils are equal, round, and reactive to light.  Neck: Normal range of motion.  Cardiovascular: Regular rhythm and normal heart sounds.   Respiratory: Effort normal. No respiratory distress.  GI: Soft.  Musculoskeletal: Normal range of motion.  Neurological: He is alert.  Skin: Skin is warm and dry.  Psychiatric: He has a normal mood and affect. His behavior is normal. Judgment normal. His affect is not labile and not inappropriate. His speech is tangential. His speech is not slurred. He is not agitated. Thought content is not paranoid. He does not express impulsivity. He expresses no homicidal and no suicidal ideation. He exhibits abnormal recent memory and abnormal remote memory.    Review of Systems  Constitutional: Negative.   HENT: Negative.   Eyes: Negative.   Respiratory: Negative.   Cardiovascular: Negative.   Gastrointestinal: Negative.   Musculoskeletal: Negative.   Skin: Negative.   Neurological: Positive for sensory change.  Psychiatric/Behavioral: Positive for memory loss. Negative for depression, hallucinations, substance abuse and suicidal ideas. The patient  is nervous/anxious. The patient does not have insomnia.     Blood pressure 96/61, pulse 70, temperature 97.8 F (36.6 C), temperature source Oral, resp. rate 14, SpO2 100 %.There is no height or weight on file to calculate BMI.  General Appearance: Disheveled  Eye Contact:  Minimal  Speech:  Garbled  Volume:  Decreased  Mood:  Angry and Anxious  Affect:  Congruent, Inappropriate and Labile  Thought Process:  Disorganized and Irrelevant  Orientation:  Negative  Thought Content:  Illogical and Paranoid Ideation  Suicidal Thoughts:  No  Homicidal Thoughts:  No  Memory:  Immediate;   Fair Recent;   Poor Remote;   Poor  Judgement:  Poor  Insight:  Lacking  Psychomotor Activity:  Decreased  Concentration:  Concentration: Poor  Recall:  Poor  Fund of Knowledge:  Fair  Language:  Fair  Akathisia:  No  Handed:  Right  AIMS (if indicated):     Assets:  Social Support  ADL's:  Impaired  Cognition:  Impaired,  Moderate  Sleep:  Treatment Plan Summary: Daily contact with patient to assess and evaluate symptoms and progress in treatment, Medication management and Plan Patient appears significantly better and I suspect some of what we saw yesterday was possibly postictal. He is now back on his usual antiseizure medicines and probably getting back to his baseline. Not clear to me whether it would be safe for him to go home in this condition. I'm going to continue to recommend that because of dementia with behavior problems he be referred to geropsychiatry. We can reassess her tomorrow if he is still in the hospital. No change to medicine.  Disposition: Supportive therapy provided about ongoing stressors. Discussed crisis plan, support from social network, calling 911, coming to the Emergency Department, and calling Suicide Hotline.  Alethia Berthold, MD 11/03/2015 1:58 PM

## 2015-11-03 NOTE — ED Provider Notes (Signed)
-----------------------------------------   6:24 AM on 11/03/2015 -----------------------------------------   Blood pressure 116/66, pulse (!) 103, temperature 98.5 F (36.9 C), temperature source Oral, resp. rate 14, SpO2 97 %.  The patient had no acute events since last update.  Calm and cooperative at this time.  Disposition is pending Psychiatry/Behavioral Medicine team recommendations.  Patient had a EKG ED ECG REPORT I, Daymon Larsen, the attending physician, personally viewed and interpreted this ECG.  Date: 11/03/2015 EKG Time: 0614 Rate: 68 Rhythm: normal sinus rhythm QRS Axis: normal Intervals: normal ST/T Wave abnormalities: normal Conduction Disturbances: none Narrative Interpretation: unremarkable    Daymon Larsen, MD 11/03/15 [phone]

## 2015-11-03 NOTE — BH Assessment (Addendum)
Referral information for Geriatric Placement have been faxed to;    [facility] ([phone]), Pending Review.   [facility] [phone] or [phone]), unable to reach anyone.   [facility] ([id number]), Declined due to Dementia.   [facility] ([phone]), Declined due to insurance. They are not in network with [employer].   [facility] ([id number]), Declined due to Dementia.    [facility] [phone] or [phone]), Declined, "Patient isn't appropriate for the unit."    [facility] ([id number]), Declined due to Dementia.   [facility] [phone]), unable to reach anyone.

## 2015-11-03 NOTE — ED Notes (Signed)
Patient given meal tray.

## 2015-11-03 NOTE — ED Notes (Signed)
Meal tray given to patient.

## 2015-11-04 DIAGNOSIS — F0391 Unspecified dementia with behavioral disturbance: Secondary | ICD-10-CM | POA: Diagnosis not present

## 2015-11-04 MED ORDER — HYDROCHLOROTHIAZIDE 25 MG PO TABS
12.5000 mg | ORAL_TABLET | Freq: Every day | ORAL | Status: DC
  Administered 2015-11-04 – 2015-11-07 (×4): 12.5 mg via ORAL
  Filled 2015-11-04 (×3): qty 1

## 2015-11-04 NOTE — BH Assessment (Addendum)
Writer followed up with referrals for [place].  [facility] ([phone]), still pending review. Waiting to be reviewed by the psychiatrist.  [facility]-([phone]), Information refaxed. Patient declined due to dementia dx. It's now on their exclusionary for their admission criteria.

## 2015-11-04 NOTE — Consult Note (Signed)
Psychiatry: Follow-up for 70 year old man with dementia and seizure disorder. No change to mental state. Still mild dementia and no agitated or violent behavior limited ability to care for himself. No new seizures. I have recommended referral to geriatric psychiatry. We have not gotten any positive response so far. Continue current medicine and monitoring in the emergency room and daily reevaluation.

## 2015-11-04 NOTE — ED Provider Notes (Signed)
-----------------------------------------   6:44 AM on 11/04/2015 -----------------------------------------   Blood pressure (!) 88/50, pulse 74, temperature 97.3 F (36.3 C), temperature source Oral, resp. rate 16, SpO2 98 %.  The patient had no acute events since last update.  Calm and cooperative at this time.  Disposition is pending Psychiatry/Behavioral Medicine team recommendations.     Daymon Larsen, MD 11/04/15 [phone]

## 2015-11-04 NOTE — ED Notes (Signed)
Pt at doorway yelling at this tech and officer March Rummage) wanting to leave and stating "I don't even know why I am here" and threatening staff to "bust by you and walk the hell out" this tech redirected pt back to room and explained to him the IVC process as well as the ER process.Pt shows verbal understanding and states he will be patient, I explained to pt that RN [facility]) would come in a speak with him shortly but was in the middle of tending to another pt at this time.

## 2015-11-05 NOTE — ED Notes (Signed)
Pt woke up and notified that food tray was here. Pt stated "my stomach hurts just set it to the side."  Tray sat under head of pt bed. [facility] notified of pt abd pain.

## 2015-11-05 NOTE — ED Provider Notes (Signed)
-----------------------------------------   7:31 AM on 11/05/2015 -----------------------------------------   Blood pressure 107/65, pulse 67, temperature 98 F (36.7 C), temperature source Oral, resp. rate 18, SpO2 99 %.  The patient had no acute events since last update.  Calm and cooperative at this time.  Disposition is pending per Psychiatry/Behavioral Medicine team recommendations.     Rudene Re, MD 11/05/15 [phone]

## 2015-11-05 NOTE — ED Notes (Signed)
BEHAVIORAL HEALTH ROUNDING Patient sleeping: Yes.   Patient alert and oriented: not applicable SLEEPING Behavior appropriate: Yes.  ; If no, describe: SLEEPING Nutrition and fluids offered: No SLEEPING Toileting and hygiene offered: NoSLEEPING Sitter present: not applicable, Q 15 min safety rounds and observation. Law enforcement present: Yes ODS 

## 2015-11-05 NOTE — ED Notes (Signed)
Pt waiting for placement.

## 2015-11-05 NOTE — ED Notes (Signed)

## 2015-11-06 LAB — LEVETIRACETAM LEVEL: LEVETIRACETAM: 122.5 ug/mL — AB (ref 10.0–40.0)
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
## 2015-11-06 NOTE — ED Notes (Signed)
Pt up to the toliet steady gait. Pt requesting water. Pt given cup of water and pt got back into the bed. No distress noted.

## 2015-11-06 NOTE — Progress Notes (Signed)
CSW called pt's brother, Jenny Reichmann, at [phone]. John states that pt seems to be doing much better per their phone conversation yesterday. John will be coming to visit the pt today at around 1:30pm.  Georga Kaufmann, MSW, [place]

## 2015-11-06 NOTE — ED Notes (Signed)
Breakfast was given to patient. 

## 2015-11-06 NOTE — ED Notes (Signed)
Lunch was given to patient 

## 2015-11-06 NOTE — NC FL2 (Addendum)
  [place] LEVEL OF CARE SCREENING TOOL     IDENTIFICATION  Patient Name: Jeremy [name] Birthdate: 10-20-1945 Sex: male Admission Date (Current Location): 11/02/2015  [facility] and [place] Number:  Engineering geologist and Address:  [facility], [address], [place], [place] [address]      Provider Number: [id number]  Attending Physician Name and Address:  No att. providers found  Relative Name and Phone Number:       Current Level of Care: Hospital Recommended Level of Care: [place], Memory Care Prior Approval Number:    Date Approved/Denied:   PASRR Number:   [id number]  Discharge Plan: Other (Comment) (SNF memory care)    Current Diagnoses: Patient Active Problem List   Diagnosis Date Noted  . Dementia with behavioral disturbance 11/02/2015  . Hypertension 11/02/2015  . Seizures ([place]) 11/02/2015  . Incarcerated hernia     Orientation RESPIRATION BLADDER Height & Weight     Self, Time  Normal Incontinent Weight:   Height:     BEHAVIORAL SYMPTOMS/MOOD NEUROLOGICAL BOWEL NUTRITION STATUS  Other (Comment) (Patient can become agitated at times but is able to be redirected with the correct support in place.) Convulsions/Seizures (Pt has a history of seizures. He take medication for this condition and has not had a seizure in years.) Continent Diet (Regular )  AMBULATORY STATUS COMMUNICATION OF NEEDS Skin   Supervision Verbally Normal                       Personal Care Assistance Level of Assistance  Bathing, Feeding, Dressing Bathing Assistance: Limited assistance Feeding assistance: Independent Dressing Assistance:  Limited     Functional Limitations Info  Sight, Hearing, Speech Sight Info: Adequate Hearing Info: Adequate Speech Info: Adequate    SPECIAL CARE FACTORS FREQUENCY                       Contractures Contractures Info: Not present    Additional Factors Info   Code Status, Allergies Code Status Info: Not on file Allergies Info: No known allergies            Current Medications (11/06/2015):  This is the current hospital active medication list Current Facility-Administered Medications  Medication Dose Route Frequency Provider Last Rate Last Dose  . hydrochlorothiazide (HYDRODIURIL) tablet 12.5 mg  12.5 mg Oral Once Merlyn Lot, MD      . hydrochlorothiazide (HYDRODIURIL) tablet 12.5 mg  12.5 mg Oral Daily Daymon Larsen, MD   12.5 mg at 11/06/15 1013  . losartan (COZAAR) tablet 100 mg  100 mg Oral Daily Merlyn Lot, MD   100 mg at 11/06/15 1013   Current Outpatient Prescriptions  Medication Sig Dispense Refill  . levETIRAcetam (KEPPRA) 500 MG tablet Take 500-750 mg by mouth 2 (two) times daily. Take 1 tablet every morning and take 1-1/2 tablets every evening.    Marland Kitchen losartan-hydrochlorothiazide (HYZAAR) 100-12.5 MG tablet Take 1 tablet by mouth daily.       Discharge Medications: Please see discharge summary for a list of discharge medications.  Relevant Imaging Results:  Relevant Lab Results:   Additional Information SSN: [id number]  Georga Kaufmann, LCSWA

## 2015-11-06 NOTE — ED Provider Notes (Signed)
-----------------------------------------   6:32 AM on 11/06/2015 -----------------------------------------   Blood pressure (!) 129/92, pulse 90, temperature 97.3 F (36.3 C), temperature source Oral, resp. rate 18, SpO2 99 %.  The patient had no acute events since last update.  Calm and cooperative at this time.  He has been referred to geriatric hospitals and is awaiting acceptance.     Loney Hering, MD 11/06/15 [phone]

## 2015-11-06 NOTE — ED Notes (Signed)
Patient up to bathroom at this time.

## 2015-11-06 NOTE — NC FL2 (Deleted)
  [place] LEVEL OF CARE SCREENING TOOL     IDENTIFICATION  Patient Name: Jeremy [name] Birthdate: Dec 20, 1945 Sex: male Admission Date (Current Location): 11/02/2015  [facility] and [place] Number:  Engineering geologist and Address:  [facility], [address], [place], [place] [address]      Provider Number: [id number]  Attending Physician Name and Address:  No att. providers found  Relative Name and Phone Number:       Current Level of Care: Hospital Recommended Level of Care: [place], Memory Care Prior Approval Number:    Date Approved/Denied:   PASRR Number:    Discharge Plan: Other (Comment) (ALF memory care)    Current Diagnoses: Patient Active Problem List   Diagnosis Date Noted  . Dementia with behavioral disturbance 11/02/2015  . Hypertension 11/02/2015  . Seizures ([place]) 11/02/2015  . Incarcerated hernia     Orientation RESPIRATION BLADDER Height & Weight     Self, Time  Normal Continent Weight:   Height:     BEHAVIORAL SYMPTOMS/MOOD NEUROLOGICAL BOWEL NUTRITION STATUS  Other (Comment) (Patient can become agitated at times but is able to be redirected with the correct support in place.) Convulsions/Seizures (Pt has a history of seizures. He take medication for this condition and has not had a seizure in years.) Continent Diet (Regular )  AMBULATORY STATUS COMMUNICATION OF NEEDS Skin   Independent Verbally Normal                       Personal Care Assistance Level of Assistance  Bathing, Feeding, Dressing Bathing Assistance: Limited assistance Feeding assistance: Independent Dressing Assistance: Independent     Functional Limitations Info  Sight, Hearing, Speech Sight Info: Adequate Hearing Info: Adequate Speech Info: Adequate    SPECIAL CARE FACTORS FREQUENCY                       Contractures Contractures Info: Not present    Additional Factors Info  Code Status,  Allergies Code Status Info: Not on file Allergies Info: No known allergies            Current Medications (11/06/2015):  This is the current hospital active medication list Current Facility-Administered Medications  Medication Dose Route Frequency Provider Last Rate Last Dose  . hydrochlorothiazide (HYDRODIURIL) tablet 12.5 mg  12.5 mg Oral Once Merlyn Lot, MD      . hydrochlorothiazide (HYDRODIURIL) tablet 12.5 mg  12.5 mg Oral Daily Daymon Larsen, MD   12.5 mg at 11/06/15 1013  . losartan (COZAAR) tablet 100 mg  100 mg Oral Daily Merlyn Lot, MD   100 mg at 11/06/15 1013   Current Outpatient Prescriptions  Medication Sig Dispense Refill  . levETIRAcetam (KEPPRA) 500 MG tablet Take 500-750 mg by mouth 2 (two) times daily. Take 1 tablet every morning and take 1-1/2 tablets every evening.    Marland Kitchen losartan-hydrochlorothiazide (HYZAAR) 100-12.5 MG tablet Take 1 tablet by mouth daily.       Discharge Medications: Please see discharge summary for a list of discharge medications.  Relevant Imaging Results:  Relevant Lab Results:   Additional Information SSN: [id number]  Georga Kaufmann, LCSWA

## 2015-11-06 NOTE — Progress Notes (Signed)
Patient has received a bed offer at [facility]. CSW called Admissions Caryl Comes [phone] to confirm offer. Caryl Comes has left the office for the day. CSW will call back tomorrow and notify pt's family of bed offer.  Georga Kaufmann, MSW, [place]

## 2015-11-06 NOTE — ED Notes (Signed)
BEHAVIORAL HEALTH ROUNDING Patient sleeping: No. Patient alert and oriented: yes Behavior appropriate: Yes.  ; If no, describe:  Nutrition and fluids offered: Yes  Toileting and hygiene offered: Yes  Sitter present: q 15 min checks Law enforcement present: Yes  

## 2015-11-06 NOTE — ED Notes (Signed)
Patient in shower 
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
## 2015-11-06 NOTE — ED Notes (Signed)
BEHAVIORAL HEALTH ROUNDING Patient sleeping: Yes.   Patient alert and oriented: sleeping Behavior appropriate: Yes.  ; If no, describe:  Nutrition and fluids offered: Yes  Toileting and hygiene offered: Yes  Sitter present: q 15 min checks Law enforcement present: Yes

## 2015-11-06 NOTE — ED Notes (Signed)

## 2015-11-07 LAB — CBC WITH DIFFERENTIAL/PLATELET
Basophils Absolute: 0.1 10*3/uL (ref 0–0.1)
Basophils Relative: 1 %
EOS PCT: 3 %
Eosinophils Absolute: 0.2 10*3/uL (ref 0–0.7)
HEMATOCRIT: 36.7 % — AB (ref 40.0–52.0)
Hemoglobin: 12.8 g/dL — ABNORMAL LOW (ref 13.0–18.0)
LYMPHS PCT: 22 %
Lymphs Abs: 1.6 10*3/uL (ref 1.0–3.6)
MCH: 31.3 pg (ref 26.0–34.0)
MCHC: 35 g/dL (ref 32.0–36.0)
MCV: 89.3 fL (ref 80.0–100.0)
MONO ABS: 0.7 10*3/uL (ref 0.2–1.0)
MONOS PCT: 10 %
NEUTROS ABS: 4.6 10*3/uL (ref 1.4–6.5)
Neutrophils Relative %: 64 %
Platelets: 282 10*3/uL (ref 150–440)
RBC: 4.1 MIL/uL — ABNORMAL LOW (ref 4.40–5.90)
RDW: 15.2 % — AB (ref 11.5–14.5)
WBC: 7.2 10*3/uL (ref 3.8–10.6)

## 2015-11-07 LAB — COMPREHENSIVE METABOLIC PANEL
ALT: 9 U/L — ABNORMAL LOW (ref 17–63)
ANION GAP: 6 (ref 5–15)
AST: 18 U/L (ref 15–41)
Albumin: 4.2 g/dL (ref 3.5–5.0)
Alkaline Phosphatase: 49 U/L (ref 38–126)
BILIRUBIN TOTAL: 0.3 mg/dL (ref 0.3–1.2)
BUN: 18 mg/dL (ref 6–20)
CO2: 30 mmol/L (ref 22–32)
Calcium: 9.3 mg/dL (ref 8.9–10.3)
Chloride: 105 mmol/L (ref 101–111)
Creatinine, Ser: 1.39 mg/dL — ABNORMAL HIGH (ref 0.61–1.24)
GFR, EST AFRICAN AMERICAN: 58 mL/min — AB (ref >60)
GFR, EST NON AFRICAN AMERICAN: 50 mL/min — AB (ref >60)
Glucose, Bld: 94 mg/dL (ref 65–99)
POTASSIUM: 3.8 mmol/L (ref 3.5–5.1)
Sodium: 141 mmol/L (ref 135–145)
Total Protein: 7.9 g/dL (ref 6.5–8.1)

## 2015-11-07 NOTE — Progress Notes (Signed)
CSW received a call back from pt's brother, Jeremy [name], [phone]. CSW informed Jeremy [name] of the bed offers that the pt has received. Jeremy [name] stated that he would like to move forward with the bed offer from [facility].  CSW called [facility] Jeremy [name]) [phone] to inform her to begin [place] for insurance. CSW was contacted by Jeremy [name] later in the day and informed that pt has been denied authorization from his [facility] as he does not have a need for skilled nursing. CSW consulted with RN case manager and TTS to develop a back-up plan for the pt. Pt is being considered for a psych bed at [facility]. If denied, pt will return home with support arranged by RN care manager.   CSW called pt's brother, Jeremy [name], [phone] to inform him of above. Jeremy [name] expressed his understanding and states that he will be available to pick pt up from the hospital on 9/21 in the late afternoon if a psych bed is not secured.  CSW met with pt and informed him of above. Pt is agreeable with the plan. Estimated D/C date 9/21. RN notified.  Jeremy [name], MSW, [place]

## 2015-11-07 NOTE — ED Notes (Signed)
Pt given breakfast tray. Pt ambulated to bathroom

## 2015-11-07 NOTE — ED Notes (Signed)
Pt back in room.

## 2015-11-07 NOTE — ED Notes (Signed)
Pt up to bathroom for shower.

## 2015-11-07 NOTE — Progress Notes (Signed)
Patient referral faxed to [place]. Nash Shearer, LPC-A, [facility]  Counselor 11/07/2015 1:04 PM

## 2015-11-07 NOTE — Care Management Note (Signed)
Case Management Note  Patient Details  Name: Jeremy [name] MRN: [id number] Date of Birth: 11/06/1945  Subjective/Objective:    Spoke to patient again about needs, and he is under theimpression he will be here in hospital for an exteded time. I did inform him I contacted Alvis Lemmings about having someone to come  help with his medications as well as a CSW and now he  Says he does not think he will be going home soon I have let the CSW here [place] know I have made a referral.              Action/Plan:   Expected Discharge Date:                  Expected Discharge Plan:     In-House Referral:     Discharge planning Services     Post Acute Care Choice:    Choice offered to:     DME Arranged:    DME Agency:     HH Arranged:    [place] Agency:     Status of Service:     If discussed at [employer], dates discussed:    Additional Comments:  Beau Fanny, RN 11/07/2015, 1:53 PM

## 2015-11-07 NOTE — ED Provider Notes (Signed)
-----------------------------------------   6:23 AM on 11/07/2015 -----------------------------------------   Blood pressure (!) 145/88, pulse 85, temperature 98.5 F (36.9 C), temperature source Oral, resp. rate 18, SpO2 99 %.  The patient had no acute events since last update.  Calm and cooperative at this time.  Disposition is pending Psychiatry/Behavioral Medicine team recommendations.  I have ordered repeat CBC and CMP this morning given his electrolyte abnormalities and anemia which were noted on his labs obtained 5 days ago. His anemia has improved, hypokalemia has resolved, creatinine has also improved today.   Joanne Gavel, MD 11/07/15 0700

## 2015-11-07 NOTE — ED Notes (Signed)
Patient in shower 

## 2015-11-07 NOTE — ED Notes (Signed)
Spoke with CSW regarding patient's disposition to go home in the morning with his brother. Brother aware per CSW.

## 2015-11-07 NOTE — Care Management Note (Signed)
Case Management Note  Patient Details  Name: Jeremy [name] MRN: [id number] Date of Birth: [birth year]  Subjective/Objective:  Spoke to patient in ER and asked if he was interested in help at home. Patient has expressed a desire to have someone come to him at home, but at this time is not sure of his needs. Will try to contact agencies for medication reconciliation, and CSW.                  Action/Plan:   Expected Discharge Date:                  Expected Discharge Plan:     In-House Referral:     Discharge planning Services     Post Acute Care Choice:    Choice offered to:     DME Arranged:    DME Agency:     HH Arranged:    HH Agency:     Status of Service:     If discussed at [employer] of Stay Meetings, dates discussed:    Additional Comments:  Beau Fanny, RN 11/07/2015, 1:47 PM

## 2015-11-07 NOTE — ED Notes (Signed)
Report from Jill, RN  

## 2015-11-07 NOTE — ED Notes (Signed)
Pt seen in bed asleep. Environment secure and safe. Nothing needed by staff at this time

## 2015-11-07 NOTE — ED Notes (Signed)
Pt eating lunch tray at this time  

## 2015-11-07 NOTE — ED Notes (Signed)
Pt given meal tray.

## 2015-11-07 NOTE — ED Notes (Signed)
Pt currently seen lying in bed asleep. Nothing needed of staff. environment safe

## 2015-11-07 NOTE — Progress Notes (Signed)
Pt has received a bed offer at [facility]. CSW called [place] from [facility] [phone] to confirm bed offer. No answer, left a message.   CSW called [facility] [phone] to confirm bed offer and spoke with Neoma Laming. Neoma Laming states that she could begin working on [employer] fast streamer for pt and possibly have him placed today. CSW stated that she would discuss bed offers with the family and call her back soon with a final decision.  CSW called pt's brother, Jenny Reichmann, [phone]. No answer, left a message. Will call again later and will also inform pt of bed offers received.   Georga Kaufmann, MSW, [place]

## 2015-11-08 NOTE — Care Management Note (Signed)
Case Management Note  Patient Details  Name: Jeremy [name] MRN: [id number] Date of Birth: 1945/06/13  Subjective/Objective:      Referral to [facility] accepted by Carlean Purl, who is here to see the patient.CSW also present. Pt. S/b discharged to home with his brother later today. MD is to enter orders for Medication Management and CSW to help follow and if needed to transition the patient as needed.  Action/Plan:   Expected Discharge Date:                  Expected Discharge Plan:     In-House Referral:     Discharge planning Services     Post Acute Care Choice:    Choice offered to:     DME Arranged:    DME Agency:     HH Arranged:    HH Agency:     Status of Service:     If discussed at [employer] of Stay Meetings, dates discussed:    Additional Comments:  Beau Fanny, RN 11/08/2015, 12:02 PM

## 2015-11-08 NOTE — ED Provider Notes (Signed)
-----------------------------------------   4:41 AM on 11/08/2015 -----------------------------------------   BP 104/71 (BP Location: Right Arm)   Pulse 83   Temp 98 F (36.7 C)   Resp 16   SpO2 100%   No acute events overnight. Awaiting psychiatric disposition.      Nance Pear, MD 11/08/15 [phone]

## 2015-11-08 NOTE — Progress Notes (Signed)
Pt has been declined by [facility] due to no bed availability.   Georga Kaufmann, MSW, [place]

## 2015-11-08 NOTE — ED Provider Notes (Signed)
-----------------------------------------   2:54 PM on 11/08/2015 -----------------------------------------   Blood pressure 116/69, pulse 82, temperature 98 F (36.7 C), resp. rate 16, SpO2 100 %.  The patient had no acute events since last update.  Calm and cooperative at this time.  IVC has been rescinded by the psychiatry service. Deemed appropriate by Dr. Weber Cooks for discharge to home. Patient has been cooperative and has not expressed any suicidal or homicidal ideation. The patient's brother has agreed to take the patient home.      Orbie Pyo, MD 11/08/15 [phone]

## 2015-11-08 NOTE — ED Notes (Signed)
Pt eating his lunch at this time.

## 2015-11-08 NOTE — Progress Notes (Signed)
CSW called pt's brother, Jenny Reichmann, [phone] around 1:30p this afternoon. CSW informed Jenny Reichmann that pt has been medically and psychiatrically cleared and that he has not been able to receive a bed at an inpt psych unit. CSW explained that she has consulted with RN care manager to ensure that there will be supports in place for pt at home. Pt will receive medication management and a social worker who will continue to handle his case. John expressed his understanding and is agreeable to picking pt up from the ED today and continuing to assist with his care.   Georga Kaufmann, MSW, [place]

## 2015-11-08 NOTE — ED Notes (Signed)
Notified that pt to be picked up at [facility] by his brother. Pt still IVC'd. Jeani Hawking, LCSW to work with MD to get IVC rescinded.

## 2015-11-08 NOTE — ED Notes (Signed)
Pt visited by Carlean Purl, [facility] home health nurse.

## 2015-11-08 NOTE — ED Notes (Addendum)
Pt showered by Doroteo Bradford, EDT.

## 2015-11-08 NOTE — ED Notes (Signed)
Pt discharged home with brother and to home health care after verbalizing understanding of discharge instructions; nad noted.

## 2015-11-09 DIAGNOSIS — I10 Essential (primary) hypertension: Secondary | ICD-10-CM | POA: Diagnosis not present

## 2015-11-09 DIAGNOSIS — Z9114 Patient's other noncompliance with medication regimen: Secondary | ICD-10-CM | POA: Diagnosis not present

## 2015-11-09 DIAGNOSIS — R569 Unspecified convulsions: Secondary | ICD-10-CM | POA: Diagnosis not present

## 2015-11-14 DIAGNOSIS — Z9114 Patient's other noncompliance with medication regimen: Secondary | ICD-10-CM | POA: Diagnosis not present

## 2015-11-14 DIAGNOSIS — I10 Essential (primary) hypertension: Secondary | ICD-10-CM | POA: Diagnosis not present

## 2015-11-14 DIAGNOSIS — R569 Unspecified convulsions: Secondary | ICD-10-CM | POA: Diagnosis not present

## 2015-11-21 DIAGNOSIS — Z9114 Patient's other noncompliance with medication regimen: Secondary | ICD-10-CM | POA: Diagnosis not present

## 2015-11-21 DIAGNOSIS — I10 Essential (primary) hypertension: Secondary | ICD-10-CM | POA: Diagnosis not present

## 2015-11-21 DIAGNOSIS — R569 Unspecified convulsions: Secondary | ICD-10-CM | POA: Diagnosis not present

## 2015-11-22 DIAGNOSIS — R569 Unspecified convulsions: Secondary | ICD-10-CM | POA: Diagnosis not present

## 2015-11-22 DIAGNOSIS — Z9114 Patient's other noncompliance with medication regimen: Secondary | ICD-10-CM | POA: Diagnosis not present

## 2015-11-22 DIAGNOSIS — I10 Essential (primary) hypertension: Secondary | ICD-10-CM | POA: Diagnosis not present

## 2015-11-27 DIAGNOSIS — R569 Unspecified convulsions: Secondary | ICD-10-CM | POA: Diagnosis not present

## 2015-11-27 DIAGNOSIS — K219 Gastro-esophageal reflux disease without esophagitis: Secondary | ICD-10-CM | POA: Diagnosis not present

## 2015-11-27 DIAGNOSIS — R413 Other amnesia: Secondary | ICD-10-CM | POA: Diagnosis not present

## 2015-11-27 DIAGNOSIS — I10 Essential (primary) hypertension: Secondary | ICD-10-CM | POA: Diagnosis not present

## 2015-11-28 DIAGNOSIS — R569 Unspecified convulsions: Secondary | ICD-10-CM | POA: Diagnosis not present

## 2015-11-28 DIAGNOSIS — I10 Essential (primary) hypertension: Secondary | ICD-10-CM | POA: Diagnosis not present

## 2015-11-28 DIAGNOSIS — Z9114 Patient's other noncompliance with medication regimen: Secondary | ICD-10-CM | POA: Diagnosis not present

## 2015-12-11 DIAGNOSIS — Z9114 Patient's other noncompliance with medication regimen: Secondary | ICD-10-CM | POA: Diagnosis not present

## 2015-12-11 DIAGNOSIS — R569 Unspecified convulsions: Secondary | ICD-10-CM | POA: Diagnosis not present

## 2015-12-11 DIAGNOSIS — I10 Essential (primary) hypertension: Secondary | ICD-10-CM | POA: Diagnosis not present

## 2016-02-25 DIAGNOSIS — R569 Unspecified convulsions: Secondary | ICD-10-CM | POA: Diagnosis not present

## 2016-02-25 DIAGNOSIS — I10 Essential (primary) hypertension: Secondary | ICD-10-CM | POA: Diagnosis not present

## 2016-02-25 DIAGNOSIS — Z125 Encounter for screening for malignant neoplasm of prostate: Secondary | ICD-10-CM | POA: Diagnosis not present

## 2016-02-25 DIAGNOSIS — Z79899 Other long term (current) drug therapy: Secondary | ICD-10-CM | POA: Diagnosis not present

## 2016-02-25 DIAGNOSIS — R413 Other amnesia: Secondary | ICD-10-CM | POA: Diagnosis not present

## 2016-03-13 ENCOUNTER — Encounter: Payer: Self-pay | Admitting: Urology

## 2016-03-13 ENCOUNTER — Ambulatory Visit: Payer: Medicare HMO | Admitting: Urology

## 2016-10-27 ENCOUNTER — Emergency Department: Payer: Medicare HMO

## 2016-10-27 ENCOUNTER — Encounter: Payer: Self-pay | Admitting: Emergency Medicine

## 2016-10-27 ENCOUNTER — Emergency Department
Admission: EM | Admit: 2016-10-27 | Discharge: 2016-10-27 | Discharge status: Against Medical Advice | Payer: Medicare HMO | Attending: Emergency Medicine | Admitting: Emergency Medicine

## 2016-10-27 DIAGNOSIS — R569 Unspecified convulsions: Secondary | ICD-10-CM | POA: Diagnosis not present

## 2016-10-27 DIAGNOSIS — R4182 Altered mental status, unspecified: Secondary | ICD-10-CM | POA: Diagnosis not present

## 2016-10-27 DIAGNOSIS — Z87891 Personal history of nicotine dependence: Secondary | ICD-10-CM | POA: Diagnosis not present

## 2016-10-27 DIAGNOSIS — I10 Essential (primary) hypertension: Secondary | ICD-10-CM | POA: Diagnosis present

## 2016-10-27 DIAGNOSIS — R4020 Unspecified coma: Secondary | ICD-10-CM | POA: Diagnosis not present

## 2016-10-27 DIAGNOSIS — F068 Other specified mental disorders due to known physiological condition: Secondary | ICD-10-CM | POA: Diagnosis not present

## 2016-10-27 DIAGNOSIS — F23 Brief psychotic disorder: Secondary | ICD-10-CM

## 2016-10-27 LAB — URINALYSIS, COMPLETE (UACMP) WITH MICROSCOPIC
Bilirubin Urine: NEGATIVE
GLUCOSE, UA: NEGATIVE mg/dL
HGB URINE DIPSTICK: NEGATIVE
Ketones, ur: NEGATIVE mg/dL
LEUKOCYTES UA: NEGATIVE
NITRITE: NEGATIVE
PH: 5 (ref 5.0–8.0)
Protein, ur: NEGATIVE mg/dL
RBC / HPF: NONE SEEN RBC/hpf (ref 0–5)
SPECIFIC GRAVITY, URINE: 1.011 (ref 1.005–1.030)
Squamous Epithelial / LPF: NONE SEEN

## 2016-10-27 LAB — URINE DRUG SCREEN, QUALITATIVE (ARMC ONLY)
AMPHETAMINES, UR SCREEN: NOT DETECTED
BENZODIAZEPINE, UR SCRN: NOT DETECTED
Barbiturates, Ur Screen: NOT DETECTED
CANNABINOID 50 NG, UR ~~LOC~~: NOT DETECTED
Cocaine Metabolite,Ur ~~LOC~~: NOT DETECTED
MDMA (Ecstasy)Ur Screen: NOT DETECTED
METHADONE SCREEN, URINE: NOT DETECTED
OPIATE, UR SCREEN: NOT DETECTED
PHENCYCLIDINE (PCP) UR S: NOT DETECTED
Tricyclic, Ur Screen: NOT DETECTED

## 2016-10-27 LAB — COMPREHENSIVE METABOLIC PANEL
ALBUMIN: 3.9 g/dL (ref 3.5–5.0)
ALT: 9 U/L — ABNORMAL LOW (ref 17–63)
ANION GAP: 9 (ref 5–15)
AST: 30 U/L (ref 15–41)
Alkaline Phosphatase: 66 U/L (ref 38–126)
BILIRUBIN TOTAL: 0.7 mg/dL (ref 0.3–1.2)
BUN: 15 mg/dL (ref 6–20)
CO2: 19 mmol/L — ABNORMAL LOW (ref 22–32)
Calcium: 8.6 mg/dL — ABNORMAL LOW (ref 8.9–10.3)
Chloride: 110 mmol/L (ref 101–111)
Creatinine, Ser: 1.43 mg/dL — ABNORMAL HIGH (ref 0.61–1.24)
GFR calc Af Amer: 56 mL/min — ABNORMAL LOW (ref >60)
GFR, EST NON AFRICAN AMERICAN: 48 mL/min — AB (ref >60)
GLUCOSE: 127 mg/dL — AB (ref 65–99)
POTASSIUM: 4.1 mmol/L (ref 3.5–5.1)
Sodium: 138 mmol/L (ref 135–145)
TOTAL PROTEIN: 7.1 g/dL (ref 6.5–8.1)

## 2016-10-27 LAB — CBC WITH DIFFERENTIAL/PLATELET
BASOS ABS: 0 10*3/uL (ref 0–0.1)
BASOS PCT: 0 %
EOS ABS: 0.1 10*3/uL (ref 0–0.7)
EOS PCT: 1 %
HCT: 31.6 % — ABNORMAL LOW (ref 40.0–52.0)
HEMOGLOBIN: 11 g/dL — AB (ref 13.0–18.0)
Lymphocytes Relative: 8 %
Lymphs Abs: 0.7 10*3/uL — ABNORMAL LOW (ref 1.0–3.6)
MCH: 31 pg (ref 26.0–34.0)
MCHC: 34.8 g/dL (ref 32.0–36.0)
MCV: 89.1 fL (ref 80.0–100.0)
Monocytes Absolute: 0.5 10*3/uL (ref 0.2–1.0)
Monocytes Relative: 6 %
NEUTROS PCT: 85 %
Neutro Abs: 7.8 10*3/uL — ABNORMAL HIGH (ref 1.4–6.5)
PLATELETS: 201 10*3/uL (ref 150–440)
RBC: 3.55 MIL/uL — AB (ref 4.40–5.90)
RDW: 15.2 % — ABNORMAL HIGH (ref 11.5–14.5)
WBC: 9.2 10*3/uL (ref 3.8–10.6)

## 2016-10-27 LAB — ETHANOL: Alcohol, Ethyl (B): <5 mg/dL (ref <5)

## 2016-10-27 LAB — TROPONIN I
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
## 2016-10-27 NOTE — ED Notes (Signed)
[employer] to bedside upon pt arrival, states Sgt Radford Pax has taken out IVC papers on pt.  MD notified.

## 2016-10-27 NOTE — ED Notes (Signed)
Dr. Archie Balboa explained to the Pt that he would like to further examinate the Pt and run more test but the Pt refused to stay. Pt states that he understand the risk of leaving the hospital without further testing.

## 2016-10-27 NOTE — Discharge Instructions (Signed)
Please seek medical attention for any high fevers, chest pain, shortness of breath, change in behavior, persistent vomiting, bloody stool or any other new or concerning symptoms.  

## 2016-10-27 NOTE — ED Provider Notes (Signed)
-----------------------------------------   8:18 PM on 10/27/2016 -----------------------------------------  Patient was evaluated by Dr. Weber Cooks with psychiatry. He did rescind the IVC. The patient refused a second troponin. I did discuss with patient possibility of heart attack. Did discuss with patient outcome of 2022-06-09 are intact being death. Patient verbalizes understanding. Patient thinks that he likely had a seizure. Will discharge patient.   Nance Pear, MD 10/27/16 2017-06-08

## 2016-10-27 NOTE — ED Notes (Signed)
EDT went in to see patient and patient was standing at end of bed taking off cardiac monitor and BP cuff. Patient refusing to have blood drawn at this time and states "I just want to leave. I'm cold and I want to go home." Patient walked down hallway to EMS bay and was stopped by another RN and [employer] officer. Patient redirected to room 24 to be evaluated by psychiatrist prior to discharge.

## 2016-10-27 NOTE — ED Notes (Signed)
Pt escorted to room 24 by Curly Rim and officer.

## 2016-10-27 NOTE — ED Notes (Signed)
Pt states that he does not want to stay in the hospital. It was explained to the Pt that he was involuntary committed to the Hospital and that he can not leave the hospital. After a long conversation the Pt agreed to change into paper scrubs and remain calmed.
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
## 2017-01-16 DIAGNOSIS — R972 Elevated prostate specific antigen [PSA]: Secondary | ICD-10-CM | POA: Diagnosis not present

## 2017-01-16 DIAGNOSIS — R569 Unspecified convulsions: Secondary | ICD-10-CM | POA: Diagnosis not present

## 2017-01-16 DIAGNOSIS — R200 Anesthesia of skin: Secondary | ICD-10-CM | POA: Diagnosis not present

## 2017-01-16 DIAGNOSIS — R7309 Other abnormal glucose: Secondary | ICD-10-CM | POA: Diagnosis not present

## 2017-01-16 DIAGNOSIS — R51 Headache: Secondary | ICD-10-CM | POA: Diagnosis not present

## 2017-01-16 DIAGNOSIS — Z79899 Other long term (current) drug therapy: Secondary | ICD-10-CM | POA: Diagnosis not present

## 2017-01-16 DIAGNOSIS — I10 Essential (primary) hypertension: Secondary | ICD-10-CM | POA: Diagnosis not present

## 2017-01-16 DIAGNOSIS — E7800 Pure hypercholesterolemia, unspecified: Secondary | ICD-10-CM | POA: Diagnosis not present

## 2017-01-16 DIAGNOSIS — H539 Unspecified visual disturbance: Secondary | ICD-10-CM | POA: Diagnosis not present

## 2017-01-16 DIAGNOSIS — Z125 Encounter for screening for malignant neoplasm of prostate: Secondary | ICD-10-CM | POA: Diagnosis not present

## 2017-01-19 ENCOUNTER — Other Ambulatory Visit: Payer: Self-pay | Admitting: Internal Medicine

## 2017-01-19 DIAGNOSIS — R51 Headache: Principal | ICD-10-CM

## 2017-01-19 DIAGNOSIS — R519 Headache, unspecified: Secondary | ICD-10-CM

## 2017-01-26 ENCOUNTER — Ambulatory Visit: Payer: Medicare HMO

## 2017-02-17 DIAGNOSIS — C801 Malignant (primary) neoplasm, unspecified: Secondary | ICD-10-CM

## 2017-02-17 HISTORY — DX: Malignant (primary) neoplasm, unspecified: C80.1

## 2017-02-20 ENCOUNTER — Other Ambulatory Visit: Payer: Self-pay

## 2017-02-20 ENCOUNTER — Encounter: Payer: Self-pay | Admitting: Emergency Medicine

## 2017-02-20 ENCOUNTER — Emergency Department: Payer: Medicare HMO

## 2017-02-20 ENCOUNTER — Emergency Department
Admission: EM | Admit: 2017-02-20 | Discharge: 2017-02-24 | Disposition: A | Discharge status: Home / Self Care | Payer: Medicare HMO | Attending: Emergency Medicine | Admitting: Emergency Medicine

## 2017-02-20 DIAGNOSIS — Z9114 Patient's other noncompliance with medication regimen: Secondary | ICD-10-CM

## 2017-02-20 DIAGNOSIS — Z79899 Other long term (current) drug therapy: Secondary | ICD-10-CM | POA: Diagnosis not present

## 2017-02-20 DIAGNOSIS — R4182 Altered mental status, unspecified: Secondary | ICD-10-CM | POA: Diagnosis not present

## 2017-02-20 DIAGNOSIS — F068 Other specified mental disorders due to known physiological condition: Secondary | ICD-10-CM | POA: Diagnosis not present

## 2017-02-20 DIAGNOSIS — F0390 Unspecified dementia without behavioral disturbance: Secondary | ICD-10-CM | POA: Diagnosis present

## 2017-02-20 DIAGNOSIS — N179 Acute kidney failure, unspecified: Secondary | ICD-10-CM | POA: Insufficient documentation

## 2017-02-20 DIAGNOSIS — Z8669 Personal history of other diseases of the nervous system and sense organs: Secondary | ICD-10-CM | POA: Diagnosis not present

## 2017-02-20 DIAGNOSIS — R410 Disorientation, unspecified: Secondary | ICD-10-CM | POA: Diagnosis not present

## 2017-02-20 DIAGNOSIS — Z87891 Personal history of nicotine dependence: Secondary | ICD-10-CM | POA: Diagnosis not present

## 2017-02-20 DIAGNOSIS — F29 Unspecified psychosis not due to a substance or known physiological condition: Secondary | ICD-10-CM

## 2017-02-20 DIAGNOSIS — F0391 Unspecified dementia with behavioral disturbance: Secondary | ICD-10-CM | POA: Diagnosis present

## 2017-02-20 DIAGNOSIS — F23 Brief psychotic disorder: Secondary | ICD-10-CM | POA: Diagnosis present

## 2017-02-20 DIAGNOSIS — I10 Essential (primary) hypertension: Secondary | ICD-10-CM | POA: Insufficient documentation

## 2017-02-20 LAB — COMPREHENSIVE METABOLIC PANEL
ALK PHOS: 75 U/L (ref 38–126)
ALT: 16 U/L — ABNORMAL LOW (ref 17–63)
ANION GAP: 14 (ref 5–15)
AST: 33 U/L (ref 15–41)
Albumin: 4.8 g/dL (ref 3.5–5.0)
BILIRUBIN TOTAL: 1.2 mg/dL (ref 0.3–1.2)
BUN: 28 mg/dL — ABNORMAL HIGH (ref 6–20)
CALCIUM: 9.3 mg/dL (ref 8.9–10.3)
CO2: 22 mmol/L (ref 22–32)
Chloride: 103 mmol/L (ref 101–111)
Creatinine, Ser: 2.08 mg/dL — ABNORMAL HIGH (ref 0.61–1.24)
GFR calc non Af Amer: 31 mL/min — ABNORMAL LOW (ref >60)
GFR, EST AFRICAN AMERICAN: 35 mL/min — AB (ref >60)
Glucose, Bld: 112 mg/dL — ABNORMAL HIGH (ref 65–99)
Potassium: 3.2 mmol/L — ABNORMAL LOW (ref 3.5–5.1)
SODIUM: 139 mmol/L (ref 135–145)
TOTAL PROTEIN: 8.5 g/dL — AB (ref 6.5–8.1)

## 2017-02-20 LAB — CBC
HCT: 36.3 % — ABNORMAL LOW (ref 40.0–52.0)
HEMOGLOBIN: 12.2 g/dL — AB (ref 13.0–18.0)
MCH: 30.1 pg (ref 26.0–34.0)
MCHC: 33.6 g/dL (ref 32.0–36.0)
MCV: 89.6 fL (ref 80.0–100.0)
Platelets: 297 10*3/uL (ref 150–440)
RBC: 4.05 MIL/uL — AB (ref 4.40–5.90)
RDW: 14.5 % (ref 11.5–14.5)
WBC: 8.1 10*3/uL (ref 3.8–10.6)

## 2017-02-20 LAB — GLUCOSE, CAPILLARY: GLUCOSE-CAPILLARY: 105 mg/dL — AB (ref 65–99)

## 2017-02-20 LAB — TROPONIN I: Troponin I: 0.03 ng/mL (ref <0.03)

## 2017-02-20 LAB — ETHANOL: Alcohol, Ethyl (B): <10 mg/dL (ref <10)

## 2017-02-20 MED ORDER — ZIPRASIDONE MESYLATE 20 MG IM SOLR
INTRAMUSCULAR | Status: AC
Start: 2017-02-20 — End: 2017-02-20
  Administered 2017-02-20: 10 mg via INTRAMUSCULAR
  Filled 2017-02-20: qty 20

## 2017-02-20 MED ORDER — MIDAZOLAM HCL 5 MG/5ML IJ SOLN: 2.0000 mg | Freq: Once | INTRAMUSCULAR | Status: DC

## 2017-02-20 MED ORDER — DIPHENHYDRAMINE HCL 50 MG/ML IJ SOLN
50.0000 mg | Freq: Once | INTRAMUSCULAR | Status: AC
  Administered 2017-02-20: 50 mg via INTRAMUSCULAR

## 2017-02-20 MED ORDER — LOSARTAN POTASSIUM 50 MG PO TABS
100.0000 mg | ORAL_TABLET | Freq: Every day | ORAL | Status: DC
  Administered 2017-02-21 – 2017-02-24 (×3): 100 mg via ORAL
  Filled 2017-02-20 (×4): qty 2

## 2017-02-20 MED ORDER — SODIUM CHLORIDE 0.9 % IV SOLN
500.0000 mg | Freq: Once | INTRAVENOUS | Status: AC
  Administered 2017-02-20: 500 mg via INTRAVENOUS
  Filled 2017-02-20: qty 5

## 2017-02-20 MED ORDER — SODIUM CHLORIDE 0.9 % IV BOLUS (SEPSIS)
1000.0000 mL | Freq: Once | INTRAVENOUS | Status: AC
  Administered 2017-02-20: 1000 mL via INTRAVENOUS

## 2017-02-20 MED ORDER — ZIPRASIDONE MESYLATE 20 MG IM SOLR
10.0000 mg | Freq: Once | INTRAMUSCULAR | Status: AC
  Administered 2017-02-20: 10 mg via INTRAMUSCULAR

## 2017-02-20 MED ORDER — LORAZEPAM 2 MG/ML IJ SOLN
INTRAMUSCULAR | Status: AC
  Administered 2017-02-20: 2 mg via INTRAMUSCULAR
  Filled 2017-02-20: qty 1

## 2017-02-20 MED ORDER — DIPHENHYDRAMINE HCL 50 MG/ML IJ SOLN
INTRAMUSCULAR | Status: AC
  Administered 2017-02-20: 50 mg via INTRAMUSCULAR
  Filled 2017-02-20: qty 1

## 2017-02-20 MED ORDER — LEVETIRACETAM 500 MG PO TABS: 500.0000 mg | ORAL_TABLET | Freq: Two times a day (BID) | ORAL | Status: DC

## 2017-02-20 MED ORDER — HALOPERIDOL LACTATE 5 MG/ML IJ SOLN
INTRAMUSCULAR | Status: AC
  Filled 2017-02-20: qty 1

## 2017-02-20 MED ORDER — LEVETIRACETAM 500 MG PO TABS
500.0000 mg | ORAL_TABLET | Freq: Two times a day (BID) | ORAL | Status: DC
  Administered 2017-02-21: 1000 mg via ORAL
  Filled 2017-02-20 (×2): qty 2

## 2017-02-20 MED ORDER — LOSARTAN POTASSIUM-HCTZ 100-12.5 MG PO TABS: 1.0000 | ORAL_TABLET | Freq: Every day | ORAL | Status: DC

## 2017-02-20 MED ORDER — ZIPRASIDONE MESYLATE 20 MG IM SOLR
INTRAMUSCULAR | Status: AC
  Filled 2017-02-20: qty 20

## 2017-02-20 MED ORDER — ZIPRASIDONE MESYLATE 20 MG IM SOLR
20.0000 mg | Freq: Once | INTRAMUSCULAR | Status: AC
  Administered 2017-02-20: 20 mg via INTRAMUSCULAR

## 2017-02-20 MED ORDER — HYDROCHLOROTHIAZIDE 12.5 MG PO CAPS
12.5000 mg | ORAL_CAPSULE | Freq: Every day | ORAL | Status: DC
  Administered 2017-02-21 – 2017-02-24 (×3): 12.5 mg via ORAL
  Filled 2017-02-20 (×4): qty 1

## 2017-02-20 MED ORDER — LORAZEPAM 2 MG/ML IJ SOLN
2.0000 mg | Freq: Once | INTRAMUSCULAR | Status: AC
  Administered 2017-02-20: 2 mg via INTRAMUSCULAR

## 2017-02-20 NOTE — ED Notes (Signed)
Pt dentures at nurses desk in labeled denture cup.

## 2017-02-20 NOTE — Consult Note (Signed)
[place] [facility] Consult   Reason for Consult: Consult for 72 year old man with a history of seizure disorder and mild dementia brought into the hospital with confusion Referring Physician: Quentin Cornwall Patient Identification: Jeremy [name] MRN:  [id number] Principal Diagnosis: Postictal psychosis Diagnosis:   Patient Active Problem List   Diagnosis Date Noted  . 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 Dementia with behavioral disturbance [F03.91] 11/02/2015  . Hypertension [I10] 11/02/2015  . Seizures ([place]) [R56.9] 11/02/2015  . Incarcerated hernia [K46.0]     Total Time spent with patient: 30 minutes  Subjective:   Jeremy [name] is a 72 y.o. male patient admitted with patient not able to give any history.  HPI: 72 year old man brought into the emergency room after being found confused.  Patient was reported to be very confused making statements that made no sense when he came into the emergency room.  Not able to give much adequate history.  By the time I saw him patient was asleep and really not arousable.  Medical history: Patient has a history of seizure disorder and is supposed to be on Keppra.  History of occasional noncompliance.  Also a history of hypertension and hernia.  Social history: Patient had previously apparently been living on his own.  Family somewhat involved in helping him to stay stable is not clear at this point where he is living.  Substance abuse history: None reported  Past Psychiatric History: Patient has been seen at least once previously by the psychiatric service under extremely similar circumstances and at that time it ultimately appeared clear that he had had a seizure.  Once he rested and was back on his Keppra within a day his mental status change dramatically.  He still had mild dementia but he was articulate and lucid and able to formulate a plan and discuss treatment options.  No other known past psychiatric history no  history of suicide attempts or violence  Risk to Self: Is patient at risk for suicide?: No, but patient needs Medical Clearance Risk to Others:   Prior Inpatient Therapy:   Prior Outpatient Therapy:    Past Medical History:  Past Medical History:  Diagnosis Date  . Hypertension   . Seizures ([place])     Past Surgical History:  Procedure Laterality Date  . none     Family History: History reviewed. No pertinent family history. Family Psychiatric  History: None known Social History:  Social History   Substance and Sexual Activity  Alcohol Use No   Comment: quit etoh x 3 months ago     Social History   Substance and Sexual Activity  Drug Use Yes  . Types: Cocaine, IV   Comment: quit 3 months ago - x 17 year drug and alcohol use    Social History   Socioeconomic History  . Marital status: Single    Spouse name: None  . Number of children: None  . Years of education: None  . Highest education level: None  Social Needs  . Financial resource strain: None  . Food insecurity - worry: None  . Food insecurity - inability: None  . Transportation needs - medical: None  . Transportation needs - non-medical: None  Occupational History  . None  Tobacco Use  . Smoking status: Former Smoker    Years: 25.00  . Smokeless tobacco: Never Used  Substance and Sexual Activity  . Alcohol use: No    Comment: quit etoh x 3 months ago  . Drug use: Yes  Types: Cocaine, IV    Comment: quit 3 months ago - x 17 year drug and alcohol use  . Sexual activity: None  Other Topics Concern  . None  Social History Narrative  . None   Additional Social History:    Allergies:  No Known Allergies  Labs:  Results for orders placed or performed during the hospital encounter of 02/20/17 (from the past 48 hour(s))  Comprehensive metabolic panel     Status: Abnormal   Collection Time: 02/20/17 12:47 PM  Result Value Ref Range   Sodium 139 135 - 145 mmol/L   Potassium 3.2 (L) 3.5 - 5.1  mmol/L   Chloride 103 101 - 111 mmol/L   CO2 22 22 - 32 mmol/L   Glucose, Bld 112 (H) 65 - 99 mg/dL   BUN 28 (H) 6 - 20 mg/dL   Creatinine, Ser 2.08 (H) 0.61 - 1.24 mg/dL   Calcium 9.3 8.9 - 10.3 mg/dL   Total Protein 8.5 (H) 6.5 - 8.1 g/dL   Albumin 4.8 3.5 - 5.0 g/dL   AST 33 15 - 41 U/L   ALT 16 (L) 17 - 63 U/L   Alkaline Phosphatase 75 38 - 126 U/L   Total Bilirubin 1.2 0.3 - 1.2 mg/dL   GFR calc non Af Amer 31 (L) >60 mL/min   GFR calc Af Amer 35 (L) >60 mL/min    Comment: (NOTE) The eGFR has been calculated using the CKD EPI equation. This calculation has not been validated in all clinical situations. eGFR's persistently <60 mL/min signify possible Chronic Kidney Disease.    Anion gap 14 5 - 15    Comment: Performed at [facility], [address] [place], [place] [address]  CBC     Status: Abnormal   Collection Time: 02/20/17 12:47 PM  Result Value Ref Range   WBC 8.1 3.8 - 10.6 K/uL   RBC 4.05 (L) 4.40 - 5.90 MIL/uL   Hemoglobin 12.2 (L) 13.0 - 18.0 g/dL   HCT 36.3 (L) 40.0 - 52.0 %   MCV 89.6 80.0 - 100.0 fL   MCH 30.1 26.0 - 34.0 pg   MCHC 33.6 32.0 - 36.0 g/dL   RDW 14.5 11.5 - 14.5 %   Platelets 297 150 - 440 K/uL    Comment: Performed at [facility], [address] [place], [place] [address]  Ethanol     Status: None   Collection Time: 02/20/17 12:47 PM  Result Value Ref Range   Alcohol, Ethyl (B) <10 <10 mg/dL    Comment:        LOWEST DETECTABLE LIMIT FOR SERUM ALCOHOL IS 10 mg/dL FOR MEDICAL PURPOSES ONLY Performed at [facility], [address] [place], [place] [address]   Glucose, capillary     Status: Abnormal   Collection Time: 02/20/17 12:58 PM  Result Value Ref Range   Glucose-Capillary 105 (H) 65 - 99 mg/dL   Comment 1 Notify RN   Troponin I     Status: Abnormal   Collection Time: 02/20/17  1:06 PM  Result Value Ref Range   Troponin I 0.03 (HH) <0.03 ng/mL    Comment: CRITICAL RESULT CALLED TO, READ BACK  BY AND VERIFIED WITH ALICIA STAROPOLI RN AT 0865 02/20/17. MSS Performed at [facility], [address], [place], [place] [address]     Current Facility-Administered Medications  Medication Dose Route Frequency Provider Last Rate Last Dose  . ziprasidone (GEODON) 20 MG injection           .  losartan (COZAAR) tablet 100 mg  100 mg Oral Daily Schaevitz, Randall An, MD       And  . hydrochlorothiazide (MICROZIDE) capsule 12.5 mg  12.5 mg Oral Daily Orbie Pyo, MD      . levETIRAcetam (KEPPRA) tablet 500-1,000 mg  500-1,000 mg Oral BID Orbie Pyo, MD       Current Outpatient Medications  Medication Sig Dispense Refill  . levETIRAcetam (KEPPRA) 500 MG tablet Take 500-750 mg by mouth 2 (two) times daily. Take 1 tablet every morning and take 1-1/2 tablets every evening.    Marland Kitchen losartan-hydrochlorothiazide (HYZAAR) 100-12.5 MG tablet Take 1 tablet by mouth daily.      Musculoskeletal: Strength & Muscle Tone: decreased and atrophy Gait & Station: unable to stand Patient leans: N/A  Psychiatric Specialty Exam: Physical Exam  Nursing note and vitals reviewed. Constitutional: He appears well-developed.  HENT:  Head: Normocephalic and atraumatic.  Eyes: Conjunctivae are normal. Pupils are equal, round, and reactive to light.  Neck: Normal range of motion.  Cardiovascular: Normal heart sounds.  Respiratory: Effort normal.  GI: Soft.  Musculoskeletal: Normal range of motion.  Skin: Skin is warm and dry.  Psychiatric: His affect is blunt. He is noncommunicative.    Review of Systems  Unable to perform ROS: Mental status change    Blood pressure (!) 174/90, pulse (!) 109, temperature 98.1 F (36.7 C), temperature source Oral, resp. rate (!) 22, height '5\' 9"'  (1.753 m), weight 68 kg (150 lb), SpO2 99 %.Body mass index is 22.15 kg/m.  General Appearance: Disheveled  Eye Contact:  None  Speech:  Negative  Volume:  Decreased  Mood:  Negative  Affect:   Negative  Thought Process:  NA  Orientation:  Negative  Thought Content:  Negative  Suicidal Thoughts:  No  Homicidal Thoughts:  No  Memory:  Negative  Judgement:  Negative  Insight:  Negative  Psychomotor Activity:  Negative  Concentration:  Concentration: Negative  Recall:  Negative  Fund of Knowledge:  Negative  Language:  Negative  Akathisia:  Negative  Handed:  Right  AIMS (if indicated):     Assets:  Social Support  ADL's:  Impaired  Cognition:  Impaired,  Moderate and Severe  Sleep:        Treatment Plan Summary: Plan 72 year old man with a history of seizure disorder.  Given that his presentation this time is almost identical to what we saw before I think the most likely thing is that this is again a postictal delirium or psychosis that will clear up once he gets back on his Keppra and rests up and gets through the postictal.  At this point he is sedated and cannot be more directly assessed but there is no indication to start any new psychiatric medicine.  He was put under involuntary commitment to facilitate holding onto him in case he became agitated.  My recommendation is that if his mental status seems to have changed for the better by tomorrow that [facility] can reevaluate him and take him off of commitment if it seems to be appropriate and there is an adequate discharge plan.  No need for any other medication orders.  If he is still here after the weekend I will follow up.  Disposition: No evidence of imminent risk to self or others at present.   Patient does not meet criteria for psychiatric inpatient admission.  Alethia Berthold, MD 02/20/2017 7:12 PM

## 2017-02-20 NOTE — ED Notes (Signed)
Tele-monitoring ordered and at bedside for patients safety and prevention of falls.

## 2017-02-20 NOTE — ED Notes (Signed)
Pt cooperative with this nurse, Chief Berdine Addison, and Romie Minus, RN.  Pt willingly accepted medications and to be dressed out at this time.  Pt still believes that he is on the moon and states "I just wanna go back to earth, I don't like the moon."  Pt is cooperative at this time.

## 2017-02-20 NOTE — ED Notes (Signed)
D/w Dr. Clearnce Hasten, informed him of pt's presenting sxs. New order received out side of protocols for CT head.

## 2017-02-20 NOTE — ED Notes (Signed)
Pt unable to follow directions most of the time, pt talks about hearing voices of another one of his brothers.

## 2017-02-20 NOTE — ED Notes (Signed)
Pt trying to get up from bed. Pt has unsteady gait. Safety sitter placed at bedside.

## 2017-02-20 NOTE — ED Notes (Signed)
Pt has dentures, placed in pink cup, by nurses station

## 2017-02-20 NOTE — ED Triage Notes (Signed)
FIRST NURSE NOTE-family reports pt has been having visual hallucinations and this happens when he does not take his meds

## 2017-02-20 NOTE — ED Notes (Signed)
Pt refusing to stay in bed. Remains with unsteady gait. Sitter remains at bedside. Medications per order to obtain CT scan and for pt safety.

## 2017-02-20 NOTE — ED Provider Notes (Signed)
[facility] Emergency Department Provider Note  ____________________________________________   First MD Initiated Contact with Patient 02/20/17 1303     (approximate)  I have reviewed the triage vital signs and the nursing notes.   HISTORY  Chief Complaint Altered Mental Status   HPI Jeremy [name] is a 72 y.o. male with a history of hypertension and seizure disorder altered mental status.  Per his brother, the patient was evicted 2 days ago from his apartment.  Since then the patient has likely been wandering the streets.  His brother says that a friend brought the patient to his home this morning.  The patient has been having an altered mental status, not making sense when he speaks.  Questionable history of substance abuse.  However, the brother says that he is likely noncompliant with his medications and has gotten into a similar mental status after having a seizure.  Also with a history of dementia with behavioral disturbance.  Past Medical History:  Diagnosis Date  . Hypertension   . Seizures [facility])     Patient Active Problem List   Diagnosis Date Noted  . 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 Dementia with behavioral disturbance 11/02/2015  . Hypertension 11/02/2015  . Seizures ([place]) 11/02/2015  . Incarcerated hernia     Past Surgical History:  Procedure Laterality Date  . none      Prior to Admission medications   Medication Sig Start Date End Date Taking? Authorizing Provider  levETIRAcetam (KEPPRA) 500 MG tablet Take 500-750 mg by mouth 2 (two) times daily. Take 1 tablet every morning and take 1-1/2 tablets every evening.    [provider]  losartan-hydrochlorothiazide (HYZAAR) 100-12.5 MG tablet Take 1 tablet by mouth daily.    [provider]    Allergies Patient has no known allergies.  History reviewed. No pertinent family history.  Social History Social History   Tobacco Use  . Smoking status: Former  Smoker    Years: 25.00  . Smokeless tobacco: Never Used  Substance Use Topics  . Alcohol use: No    Comment: quit etoh x 3 months ago  . Drug use: Yes    Types: Cocaine, IV    Comment: quit 3 months ago - x 17 year drug and alcohol use    Review of Systems  Constitutional: No fever/chills Eyes: No visual changes. ENT: No sore throat. Cardiovascular: Denies chest pain. Respiratory: Denies shortness of breath. Gastrointestinal: No abdominal pain.  No nausea, no vomiting.  No diarrhea.  No constipation. Genitourinary: Negative for dysuria. Musculoskeletal: Negative for back pain. Skin: Negative for rash. Neurological: Negative for headaches, focal weakness or numbness. Patient without any definite medical complaints on review of systems.  However, the patient has a confounded history secondary to his altered mentation.  ____________________________________________   PHYSICAL EXAM:  VITAL SIGNS: ED Triage Vitals  Enc Vitals Group     BP 02/20/17 1241 (!) 174/90     Pulse Rate 02/20/17 1241 (!) 109     Resp 02/20/17 1241 (!) 22     Temp 02/20/17 1241 98.1 F (36.7 C)     Temp Source 02/20/17 1241 Oral     SpO2 02/20/17 1241 99 %     Weight 02/20/17 1302 150 lb (68 kg)     Height 02/20/17 1241 5\' 9"  (1.753 m)     Head Circumference --      Peak Flow --      Pain Score 02/20/17 1247 0  Pain Loc --      Pain Edu? --      Excl. in [place]? --     Constitutional: Alert and oriented to self only.  Pacing in the hallway and occasionally yelling. Eyes: Conjunctivae are normal.  Head: Atraumatic. Nose: No congestion/rhinnorhea. Mouth/Throat: Mucous membranes are moist.  Neck: No stridor.   Cardiovascular: Normal rate, regular rhythm. Grossly normal heart sounds.  Good peripheral circulation. Respiratory: Normal respiratory effort.  No retractions. Lungs CTAB. Gastrointestinal: Soft and nontender. No distention. No CVA tenderness. Musculoskeletal: No lower extremity  tenderness nor edema.  No joint effusions. Neurologic:  Normal speech and language. No gross focal neurologic deficits are appreciated. Skin:  Skin is warm, dry and intact. No rash noted. Psychiatric: Agitated but redirectable.  ____________________________________________   LABS (all labs ordered are listed, but only abnormal results are displayed)  Labs Reviewed  COMPREHENSIVE METABOLIC PANEL - Abnormal; Notable for the following components:      Result Value   Potassium 3.2 (*)    Glucose, Bld 112 (*)    BUN 28 (*)    Creatinine, Ser 2.08 (*)    Total Protein 8.5 (*)    ALT 16 (*)    GFR calc non Af Amer 31 (*)    GFR calc Af Amer 35 (*)    All other components within normal limits  CBC - Abnormal; Notable for the following components:   RBC 4.05 (*)    Hemoglobin 12.2 (*)    HCT 36.3 (*)    All other components within normal limits  GLUCOSE, CAPILLARY - Abnormal; Notable for the following components:   Glucose-Capillary 105 (*)    All other components within normal limits  ETHANOL  URINE DRUG SCREEN, QUALITATIVE ([facility] ONLY)  LEVETIRACETAM LEVEL  TROPONIN I  CBG MONITORING, ED   ____________________________________________  EKG  ED ECG REPORT I, Doran Stabler, the attending physician, personally viewed and interpreted this ECG.   Date: 02/20/2017  EKG Time: 1254  Rate: 102  Rhythm: sinus tachycardia  Axis: Normal  Intervals:Short PR  ST&T Change: No ST segment elevation or depression.  No abnormal T wave inversion.  ____________________________________________  RADIOLOGY  Pending ct head ____________________________________________   PROCEDURES  Procedure(s) performed:   Procedures  Critical Care performed:   ____________________________________________   INITIAL IMPRESSION / ASSESSMENT AND PLAN / ED COURSE  Pertinent labs & imaging results that were available during my care of the patient were reviewed by me and considered in my medical  decision making (see chart for details).  Differential diagnosis includes, but is not limited to, alcohol, illicit or prescription medications, or other toxic ingestion; intracranial pathology such as stroke or intracerebral hemorrhage; fever or infectious causes including sepsis; hypoxemia and/or hypercarbia; uremia; trauma; endocrine related disorders such as diabetes, hypoglycemia, and thyroid-related diseases; hypertensive encephalopathy; etc. As part of my medical decision making, I reviewed the following data within the electronic MEDICAL RECORD NUMBER Notes from prior ED visits  ----------------------------------------- 2:05 PM on 02/20/2017 -----------------------------------------  Patient became acutely agitated in the hallway and needed security for restraints.  However, he was able to be escorted into the treatment room where he was cooperative and accepted meds without issue.  Pending psychiatric consult at this time.  Patient put under involuntary commitment.  Patient appears to be in mild acute renal failure.  Possibly prerenal.  We will give him IV fluids and recheck.       ____________________________________________   FINAL CLINICAL IMPRESSION(S) / ED  DIAGNOSES  Altered mental status.  Psychosis.  Acute renal failure.    NEW MEDICATIONS STARTED DURING THIS VISIT:  This SmartLink is deprecated. Use AVSMEDLIST instead to display the medication list for a patient.   Note:  This document was prepared using Dragon voice recognition software and may include unintentional dictation errors.     Orbie Pyo, MD 02/20/17 [phone]

## 2017-02-20 NOTE — ED Notes (Addendum)
Patient is resting comfortably at this time with no signs of distress present. Will continue to monitor. telesitter remains in place

## 2017-02-20 NOTE — ED Notes (Signed)
tele monitoring continues. Pt in NAD and resting comfortably. Will continue to monitor.

## 2017-02-20 NOTE — ED Triage Notes (Signed)
Pt arrived via POV with younger brother, pt was recently kicked out of his apartment on 02/19/17, brother states he thinks pt has been out wandering the streets. Tried the rescue mission, but no beds available. States he has hx of seizure disorder and states pt acts this way when he has not been on his medications.  Pt keeps making comments about not wanting to go to the moon.   PT denies any SI/HI, denies any substance use.

## 2017-02-20 NOTE — ED Notes (Signed)
Pt IVC/pending psych consult. 

## 2017-02-21 MED ORDER — LEVETIRACETAM 500 MG PO TABS
500.0000 mg | ORAL_TABLET | Freq: Two times a day (BID) | ORAL | Status: DC
  Administered 2017-02-21 – 2017-02-24 (×5): 500 mg via ORAL
  Filled 2017-02-21 (×3): qty 1

## 2017-02-21 NOTE — ED Notes (Signed)
Pt checked by this tech, pt remains dry and is resting and not awakened by my arrival.

## 2017-02-21 NOTE — ED Notes (Signed)
Report from Allison, RN

## 2017-02-21 NOTE — ED Notes (Signed)
BEHAVIORAL HEALTH ROUNDING  Patient sleeping: No.  Patient alert and oriented: yes  Behavior appropriate: Yes. ; If no, describe:  Nutrition and fluids offered: Yes  Toileting and hygiene offered: Yes  Sitter present: not applicable, Q 15 min safety rounds and observation.  Law enforcement present: Yes ODS  

## 2017-02-21 NOTE — ED Notes (Signed)
BEHAVIORAL HEALTH ROUNDING Patient sleeping: No. Patient alert and oriented: yes Behavior appropriate: Yes.  ; If no, describe:  Nutrition and fluids offered: yes Toileting and hygiene offered: Yes  Sitter present: q15 minute observations and security  monitoring Law enforcement present: Yes  ODS  

## 2017-02-21 NOTE — BH Assessment (Signed)
Pt still resting. TTS will continue to check back with pt until alert enough to complete assessment.

## 2017-02-21 NOTE — ED Provider Notes (Signed)
-----------------------------------------   6:34 AM on 02/21/2017 -----------------------------------------   Blood pressure (!) 174/90, pulse (!) 109, temperature 98.1 F (36.7 C), temperature source Oral, resp. rate (!) 22, height 5\' 9"  (1.753 m), weight 68 kg (150 lb), SpO2 99 %.  The patient had no acute events since last update.  Calm and cooperative at this time.  Disposition is pending Psychiatry/Behavioral Medicine team recommendations.     Paulette Blanch, MD 02/21/17 [phone]

## 2017-02-21 NOTE — ED Notes (Signed)

## 2017-02-21 NOTE — ED Notes (Signed)
Patient observed lying in bed with eyes closed  Even, unlabored respirations observed   NAD pt appears to be sleeping  Bed alarm set   I will continue to monitor along with every 15 minute visual observations and ongoing security monitoring

## 2017-02-21 NOTE — ED Notes (Signed)

## 2017-02-21 NOTE — ED Notes (Signed)
BEHAVIORAL HEALTH ROUNDING Patient sleeping: Yes.   Patient alert and oriented: not applicable SLEEPING Behavior appropriate: Yes.  ; If no, describe: SLEEPING Nutrition and fluids offered: No SLEEPING Toileting and hygiene offered: NoSLEEPING Sitter present: not applicable, Q 15 min safety rounds and observation. Law enforcement present: Yes ODS 

## 2017-02-21 NOTE — ED Notes (Signed)
ED  Is the patient under IVC or is there intent for IVC: Yes.   Is the patient medically cleared: Yes.   Is there vacancy in the ED BHU: Yes.   Is the population mix appropriate for patient:  - geriatric  Is the patient awaiting placement in inpatient or outpatient setting: Yes.   Has the patient had a psychiatric consult: Yes.   Survey of unit performed for contraband, proper placement and condition of furniture, tampering with fixtures in bathroom, shower, and each patient room: Yes.  ; Findings:  APPEARANCE/BEHAVIOR Calm and cooperative NEURO ASSESSMENT Orientation: oriented to self place and time  Denies pain Hallucinations: No.None noted (Hallucinations) Speech: Normal Gait: normal - stand by assistance provided   RESPIRATORY ASSESSMENT Even  Unlabored respirations  CARDIOVASCULAR ASSESSMENT Pulses equal   regular rate  Skin warm and dry   GASTROINTESTINAL ASSESSMENT no GI complaint EXTREMITIES Full ROM  PLAN OF CARE Provide calm/safe environment. Vital signs assessed twice daily. ED BHU Assessment once each 12-hour shift. Collaborate with TTS daily or as condition indicates. Assure the ED provider has rounded once each shift. Provide and encourage hygiene. Provide redirection as needed. Assess for escalating behavior; address immediately and inform ED provider.  Assess family dynamic and appropriateness for visitation as needed: Yes.  ; If necessary, describe findings:  Educate the patient/family about BHU procedures/visitation: Yes.  ; If necessary, describe findings:

## 2017-02-21 NOTE — ED Notes (Signed)
BEHAVIORAL HEALTH ROUNDING Patient sleeping: Yes.   Patient alert and oriented: eyes closed  Appears to be asleep Behavior appropriate: Yes.  ; If no, describe:  Nutrition and fluids offered: Yes  Toileting and hygiene offered: sleeping Sitter present: q 15 minute observations and security monitoring Law enforcement present: yes  ODS 

## 2017-02-21 NOTE — ED Notes (Signed)
Patient is IVC and is pending placement. 

## 2017-02-21 NOTE — BH Assessment (Signed)
Assessment Note  Jeremy [name] is an 72 y.o. male who presents to the ER via his brother Jeremy [name]-[phone]), due to him having concerns about his mental state. He have a history of dementia with behavioral disturbance & seizure disorder. Per the report of brother, the patient's neighbor seen him "wandering the streets and going through trash." Thus, the neighbor let him stay with him Thursday night (02/20/2016). On yesterday (02/21/2016) the neighbor took him to the brother house and brother brought him to the ER. Brother further explains, patient was rambling "talking to dead people and erratic." Brother has witnessed similar behaviors in the past, "it's usually when he's not taking his medicine." Patient is known to become paranoid and verbally abusive. "He got evicted for cussing the landlord out. He paid his rent but he cuss him (landlord) out." Patient has lived in the same place for approximately three years. In the past, when he became psychotic and verbally abusive the landlord would work with him and the family and allowed him to stay. However, it's unclear if they will allow him to stay or follow through with the eviction.  Per the report of the patient's brother, patient lives alone and independently and able to complete his ADL's without any assistance. During the interview, the patient was calm, cooperative and pleasant. He denies SI/HI and AV/H. However, per the information form nursing staff in the ER, he's been responding to internal stimuli. He was able to provide answers to majority of the questions but at times he would have thought blocking.   Diagnosis: Postictal psychosis (per chart)   Past Medical History:  Past Medical History:  Diagnosis Date  . Hypertension   . Seizures ([place])     Past Surgical History:  Procedure Laterality Date  . none      Family History: History reviewed. No pertinent family history.  Social History:  reports that he has quit  smoking. He quit after 25.00 years of use. he has never used smokeless tobacco. He reports that he uses drugs. Drugs: Cocaine and IV. He reports that he does not drink alcohol.  Additional Social History:  Alcohol / Drug Use Pain Medications: See MAR Prescriptions: See MAR Over the Counter: See MAR History of alcohol / drug use?: No history of alcohol / drug abuse Longest period of sobriety (when/how long): UTA  CIWA: CIWA-Ar BP: 101/63 Pulse Rate: 88 COWS:    Allergies: No Known Allergies  Home Medications:  (Not in a hospital admission)  OB/GYN Status:  No LMP for male patient.  General Assessment Data Location of Assessment: [facility] ED TTS Assessment: In system Is this a Tele or Face-to-Face Assessment?: Face-to-Face Is this an Initial Assessment or a Re-assessment for this encounter?: Initial Assessment Marital status: Single Maiden name: n/a Is patient pregnant?: No Pregnancy Status: No Living Arrangements: Alone Can pt return to current living arrangement?: No Admission Status: Involuntary Is patient capable of signing voluntary admission?: No(Under IVC) Referral Source: Self/Family/Friend Insurance type: [employer] Medicare  Medical Screening Exam ([place]) Medical Exam completed: Yes  Crisis Care Plan Living Arrangements: Alone Legal Guardian: Other:(Self) Name of Psychiatrist: Unknown Name of Therapist: Unknown  Education Status Is patient currently in school?: No Current Grade: n/a Highest grade of school patient has completed: n/a Name of school: n/a Contact person: n/a  Risk to self with the past 6 months Suicidal Ideation: No Has patient been a risk to self within the past 6 months prior to admission? : No Suicidal  Intent: No Has patient had any suicidal intent within the past 6 months prior to admission? : No Is patient at risk for suicide?: No Suicidal Plan?: No Has patient had any suicidal plan within the past 6 months prior to admission?  : No Access to Means: No What has been your use of drugs/alcohol within the last 12 months?: Reports of none Previous Attempts/Gestures: No How many times?: 0 Other Self Harm Risks: Reports of none Triggers for Past Attempts: None known Intentional Self Injurious Behavior: None Family Suicide History: No Recent stressful life event(s): Other (Comment)(Not taking his medications) Persecutory voices/beliefs?: No Depression: Yes Depression Symptoms: Feeling angry/irritable, Loss of interest in usual pleasures, Fatigue, Isolating Substance abuse history and/or treatment for substance abuse?: No Suicide prevention information given to non-admitted patients: Not applicable  Risk to Others within the past 6 months Homicidal Ideation: No Does patient have any lifetime risk of violence toward others beyond the six months prior to admission? : No Thoughts of Harm to Others: No Current Homicidal Intent: No Current Homicidal Plan: No Access to Homicidal Means: No Identified Victim: Reports of none History of harm to others?: Yes Assessment of Violence: None Noted Violent Behavior Description: Reports of none Does patient have access to weapons?: No Does patient have a court date: No Is patient on probation?: No  Psychosis Hallucinations: None noted Delusions: None noted  Mental Status Report Appearance/Hygiene: Unremarkable, In scrubs Eye Contact: Fair Motor Activity: Freedom of movement, Unremarkable Speech: Unremarkable Level of Consciousness: Alert Mood: Depressed, Anxious, Sad, Pleasant Affect: Appropriate to circumstance, Depressed Anxiety Level: Minimal Thought Processes: Coherent, Relevant Judgement: Partial Orientation: Person, Place, Time, Situation, Appropriate for developmental age Obsessive Compulsive Thoughts/Behaviors: Minimal  Cognitive Functioning Concentration: Normal Memory: Recent Intact, Remote Intact IQ: Average Insight: Fair Impulse Control:  Fair Appetite: Fair Weight Loss: 0 Weight Gain: 0 Sleep: No Change Total Hours of Sleep: 8 Vegetative Symptoms: None  ADLScreening [facility] Assessment Services) Patient's cognitive ability adequate to safely complete daily activities?: Yes(Per report of patient's brother.) Patient able to express need for assistance with ADLs?: Yes(Per report of patient's brother.) Independently performs ADLs?: Yes (appropriate for developmental age)(Per report of patient's brother.)  Prior Inpatient Therapy Prior Inpatient Therapy: No Prior Therapy Dates: Reports of none Prior Therapy Facilty/Provider(s): Reports of none Reason for Treatment: Reports of none  Prior Outpatient Therapy Prior Outpatient Therapy: No Prior Therapy Dates: Reports of none Prior Therapy Facilty/Provider(s): Reports of none Reason for Treatment: Reports of none Does patient have an ACCT team?: No Does patient have Intensive In-House Services?  : No Does patient have [facility] services? : No Does patient have P4CC services?: No  ADL Screening (condition at time of admission) Patient's cognitive ability adequate to safely complete daily activities?: Yes(Per report of patient's brother.) Is the patient deaf or have difficulty hearing?: No(Per report of patient's brother.) Does the patient have difficulty seeing, even when wearing glasses/contacts?: No(Per report of patient's brother.) Does the patient have difficulty concentrating, remembering, or making decisions?: No(Per report of patient's brother.) Patient able to express need for assistance with ADLs?: Yes(Per report of patient's brother.) Does the patient have difficulty dressing or bathing?: No(Per report of patient's brother.) Independently performs ADLs?: Yes (appropriate for developmental age)(Per report of patient's brother.) Does the patient have difficulty walking or climbing stairs?: No(Per report of patient's brother.) Weakness of Legs: (Per report of patient's  brother.) Weakness of Arms/Hands: (Per report of patient's brother.)  Home Assistive Devices/Equipment Home Assistive Devices/Equipment: None(Per report of patient's  brother.)    Abuse/Neglect Assessment (Assessment to be complete while patient is alone) Abuse/Neglect Assessment Can Be Completed: Yes Physical Abuse: Denies Verbal Abuse: Denies Sexual Abuse: Denies Exploitation of patient/patient's resources: Denies Self-Neglect: Denies Values / Beliefs Cultural Requests During Hospitalization: None Spiritual Requests During Hospitalization: None Consults Spiritual Care Consult Needed: No Social Work Consult Needed: No Regulatory affairs officer (For Healthcare) Does Patient Have a Medical Advance Directive?: No Would patient like information on creating a medical advance directive?: Yes (ED - Information included in AVS)    Additional Information 1:1 In Past 12 Months?: No CIRT Risk: No Elopement Risk: No Does patient have medical clearance?: Yes  Child/Adolescent Assessment Running Away Risk: Denies(Patient is an adult)  Disposition:  Disposition Initial Assessment Completed for this Encounter: Yes Disposition of Patient: Other dispositions  On Site Evaluation by:   Reviewed with Physician:    Gunnar Fusi MS, LCAS, [place], [place], CCSI Therapeutic Triage Specialist 02/21/2017 10:17 AM

## 2017-02-21 NOTE — ED Notes (Signed)
Meal tray placed at bedside - he continues to rest quietly  Continue to monitor

## 2017-02-21 NOTE — BH Assessment (Signed)
Referral information for Psychiatric Hospitalization faxed to;   [facility] [phone]),   . [facility] ([phone]----[phone])  . [facility] ([phone]) [phone]   . [facility] [phone]),  . [facility] [phone]),   . [facility] [phone] or [phone])  . [facility] [phone]),   . [facility] [phone]),   . [address] [phone]),   . [facility] ([phone])  . [facility] [phone] or [phone]),   . [facility] [phone]).

## 2017-02-22 LAB — BASIC METABOLIC PANEL
Anion gap: 7 (ref 5–15)
BUN: 21 mg/dL — AB (ref 6–20)
CO2: 27 mmol/L (ref 22–32)
CREATININE: 1.42 mg/dL — AB (ref 0.61–1.24)
Calcium: 8.5 mg/dL — ABNORMAL LOW (ref 8.9–10.3)
Chloride: 110 mmol/L (ref 101–111)
GFR calc Af Amer: 56 mL/min — ABNORMAL LOW (ref >60)
GFR, EST NON AFRICAN AMERICAN: 49 mL/min — AB (ref >60)
Glucose, Bld: 120 mg/dL — ABNORMAL HIGH (ref 65–99)
POTASSIUM: 3.9 mmol/L (ref 3.5–5.1)
SODIUM: 144 mmol/L (ref 135–145)

## 2017-02-22 LAB — URINE DRUG SCREEN, QUALITATIVE (ARMC ONLY)
Amphetamines, Ur Screen: NOT DETECTED
BARBITURATES, UR SCREEN: NOT DETECTED
Benzodiazepine, Ur Scrn: NOT DETECTED
CANNABINOID 50 NG, UR ~~LOC~~: NOT DETECTED
Cocaine Metabolite,Ur ~~LOC~~: NOT DETECTED
MDMA (ECSTASY) UR SCREEN: NOT DETECTED
Methadone Scn, Ur: NOT DETECTED
Opiate, Ur Screen: NOT DETECTED
PHENCYCLIDINE (PCP) UR S: NOT DETECTED
TRICYCLIC, UR SCREEN: NOT DETECTED

## 2017-02-22 LAB — TROPONIN I

## 2017-02-22 NOTE — ED Notes (Signed)
BEHAVIORAL HEALTH ROUNDING Patient sleeping: Yes.   Patient alert and oriented: not applicable SLEEPING Behavior appropriate: Yes.  ; If no, describe: SLEEPING Nutrition and fluids offered: No SLEEPING Toileting and hygiene offered: NoSLEEPING Sitter present: not applicable, Q 15 min safety rounds and observation. Law enforcement present: Yes ODS 

## 2017-02-22 NOTE — ED Notes (Signed)
Pt resting comfortably at this time.  Breathing even and non-labored.  Bed in low position.  Tele-sitter in place.

## 2017-02-22 NOTE — ED Notes (Signed)
Breakfast tray given to the patient.

## 2017-02-22 NOTE — ED Provider Notes (Signed)
-----------------------------------------   6:56 AM on 02/22/2017 -----------------------------------------   Blood pressure 97/73, pulse 60, temperature 98.3 F (36.8 C), temperature source Oral, resp. rate 17, height 5\' 9"  (1.753 m), weight 68 kg (150 lb), SpO2 97 %.  The patient had no acute events since last update.  He had some repeat labs which look improved from initial.  Calm and cooperative at this time.  Disposition is pending Psychiatry/Behavioral Medicine team recommendations.     Paulette Blanch, MD 02/22/17 [phone]

## 2017-02-22 NOTE — ED Notes (Signed)
Pt up to bathroom with a slow but steady gait.

## 2017-02-22 NOTE — ED Notes (Signed)
Troponin and metabolic panel reported below were actually collected on 02/21/17 at 2148 pm. Not 02/20/17 at 2148 pm.  Murray Hodgkins in the lab notified and will correct time.

## 2017-02-23 DIAGNOSIS — G40802 Other epilepsy, not intractable, without status epilepticus: Secondary | ICD-10-CM | POA: Diagnosis not present

## 2017-02-23 DIAGNOSIS — F0390 Unspecified dementia without behavioral disturbance: Secondary | ICD-10-CM

## 2017-02-23 MED ORDER — ZIPRASIDONE MESYLATE 20 MG IM SOLR
INTRAMUSCULAR | Status: AC
  Administered 2017-02-23: 10 mg via INTRAMUSCULAR
  Filled 2017-02-23: qty 20

## 2017-02-23 MED ORDER — ZIPRASIDONE MESYLATE 20 MG IM SOLR
10.0000 mg | Freq: Once | INTRAMUSCULAR | Status: AC
  Administered 2017-02-23: 10 mg via INTRAMUSCULAR

## 2017-02-23 MED ORDER — LORAZEPAM 1 MG PO TABS
1.0000 mg | ORAL_TABLET | ORAL | Status: DC
  Filled 2017-02-23: qty 1

## 2017-02-23 MED ORDER — LORAZEPAM 1 MG PO TABS
ORAL_TABLET | ORAL | Status: AC
  Filled 2017-02-23: qty 1

## 2017-02-23 NOTE — ED Notes (Signed)
Patient agitated at this time, stating he "wants [his] things" and is "ready to go". He is redirected and encouraged to rest. Patient is paranoid and anxious but cooperative with directions at this time.

## 2017-02-23 NOTE — ED Notes (Signed)
Pt TV cut on and put on news per pt request

## 2017-02-23 NOTE — ED Notes (Signed)
BEHAVIORAL HEALTH ROUNDING Patient sleeping: Yes.   Patient alert and oriented: not applicable SLEEPING Behavior appropriate: Yes.  ; If no, describe: SLEEPING Nutrition and fluids offered: No SLEEPING Toileting and hygiene offered: NoSLEEPING Sitter present: not applicable, Q 15 min safety rounds and observation. Law enforcement present: Yes ODS 

## 2017-02-23 NOTE — ED Notes (Signed)
BEHAVIORAL HEALTH ROUNDING Patient sleeping: Yes.   Patient alert and oriented: not applicable SLEEPING Behavior appropriate: Yes.  ; If no, describe: SLEEPING Nutrition and fluids offered: No SLEEPING Toileting and hygiene offered: NoSLEEPING Sitter present: telesitter, Q 15 min safety rounds and observation. Law enforcement present: Yes ODS

## 2017-02-23 NOTE — ED Provider Notes (Signed)
-----------------------------------------   1:42 PM on 02/23/2017 -----------------------------------------   Blood pressure (!) 121/98, pulse (!) 59, temperature 97.6 F (36.4 C), temperature source Oral, resp. rate 18, height 5\' 9"  (1.753 m), weight 68 kg (150 lb), SpO2 100 %.  The patient had no acute events since last update.  TTS currently working with the patient for placement.  Patient has been referred to various psychiatric facilities and we are awaiting to hear back.  Labs and CT head are largely within normal limits.       Harvest Dark, MD 02/23/17 1343

## 2017-02-23 NOTE — ED Notes (Signed)
Pt ambulated to bathroom with no assistance.  

## 2017-02-23 NOTE — Consult Note (Signed)
[facility] Psychiatry Consult   Reason for Consult: Consult for 72 year old man with a history of dementia who is been here for a few days recovering from delirium and psychosis Referring Physician: McShane Patient Identification: Jeremy [name] MRN:  [id number] Principal Diagnosis: Postictal psychosis Diagnosis:   Patient Active Problem List   Diagnosis Date Noted  . 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 Dementia with behavioral disturbance [F03.91] 11/02/2015  . Hypertension [I10] 11/02/2015  . Seizures ([place]) [R56.9] 11/02/2015  . Incarcerated hernia [K46.0]     Total Time spent with patient: 30 minutes  Subjective:   Jeremy [name] is a 72 y.o. male patient admitted with "where is my cell phone?".  HPI: Patient interviewed chart reviewed.  See previous notes.  This is a follow-up for this 72 year old man who is been in the emergency room for several days.  On evaluation today the patient was awake alert oriented and much more appropriately communicative than the last time I saw him.  He was agitated and demanding that his belongings be given back to him because he wanted to leave although when I ask him where he would go he admitted that he did not have any place to stay.  Patient was oriented to where he was not to the correct date and time although his short-term memory is definitely impaired.  Also his ability to think through and plan his actions is quite impaired.  He knows he is in the hospital and he knows that he needs to take his medicine although he was at a loss for words to describe exactly what his medication is for him.  Patient admitted that he has no place to live right now because his landlord put him out.  Patient has been seen in the emergency room previously under similar circumstances where he will present with a  Past Psychiatric History: Psychotic like delirium which I think probably represents his postictal condition that will clear up after a  day or 2 to leave him at his baseline mild dementia  Risk to Self: Suicidal Ideation: No Suicidal Intent: No Is patient at risk for suicide?: No Suicidal Plan?: No Access to Means: No What has been your use of drugs/alcohol within the last 12 months?: Reports of none How many times?: 0 Other Self Harm Risks: Reports of none Triggers for Past Attempts: None known Intentional Self Injurious Behavior: None Risk to Others: Homicidal Ideation: No Thoughts of Harm to Others: No Current Homicidal Intent: No Current Homicidal Plan: No Access to Homicidal Means: No Identified Victim: Reports of none History of harm to others?: Yes Assessment of Violence: None Noted Violent Behavior Description: Reports of none Does patient have access to weapons?: No Does patient have a court date: No Prior Inpatient Therapy: Prior Inpatient Therapy: No Prior Therapy Dates: Reports of none Prior Therapy Facilty/Provider(s): Reports of none Reason for Treatment: Reports of none Prior Outpatient Therapy: Prior Outpatient Therapy: No Prior Therapy Dates: Reports of none Prior Therapy Facilty/Provider(s): Reports of none Reason for Treatment: Reports of none Does patient have an ACCT team?: No Does patient have Intensive In-House Services?  : No Does patient have [facility] services? : No Does patient have P4CC services?: No  Past Medical History:  Past Medical History:  Diagnosis Date  . Hypertension   . Seizures ([place])     Past Surgical History:  Procedure Laterality Date  . none     Family History: History reviewed. No pertinent family history. Family Psychiatric  History:  Unknown Social History:  Social History   Substance and Sexual Activity  Alcohol Use No   Comment: quit etoh x 3 months ago     Social History   Substance and Sexual Activity  Drug Use Yes  . Types: Cocaine, IV   Comment: quit 3 months ago - x 17 year drug and alcohol use    Social History   Socioeconomic History   . Marital status: Single    Spouse name: None  . Number of children: None  . Years of education: None  . Highest education level: None  Social Needs  . Financial resource strain: None  . Food insecurity - worry: None  . Food insecurity - inability: None  . Transportation needs - medical: None  . Transportation needs - non-medical: None  Occupational History  . None  Tobacco Use  . Smoking status: Former Smoker    Years: 25.00  . Smokeless tobacco: Never Used  Substance and Sexual Activity  . Alcohol use: No    Comment: quit etoh x 3 months ago  . Drug use: Yes    Types: Cocaine, IV    Comment: quit 3 months ago - x 17 year drug and alcohol use  . Sexual activity: None  Other Topics Concern  . None  Social History Narrative  . None   Additional Social History:    Allergies:  No Known Allergies  Labs:  Results for orders placed or performed during the hospital encounter of 02/20/17 (from the past 48 hour(s))  Troponin I     Status: None   Collection Time: 02/21/17  9:48 PM  Result Value Ref Range   Troponin I <0.03 <0.03 ng/mL    Comment: Performed at [facility], [address] [place],  [address]  Basic metabolic panel     Status: Abnormal   Collection Time: 02/21/17  9:48 PM  Result Value Ref Range   Sodium 144 135 - 145 mmol/L   Potassium 3.9 3.5 - 5.1 mmol/L   Chloride 110 101 - 111 mmol/L   CO2 27 22 - 32 mmol/L   Glucose, Bld 120 (H) 65 - 99 mg/dL   BUN 21 (H) 6 - 20 mg/dL   Creatinine, Ser 1.42 (H) 0.61 - 1.24 mg/dL   Calcium 8.5 (L) 8.9 - 10.3 mg/dL   GFR calc non Af Amer 49 (L) >60 mL/min   GFR calc Af Amer 56 (L) >60 mL/min    Comment: (NOTE) The eGFR has been calculated using the CKD EPI equation. This calculation has not been validated in all clinical situations. eGFR's persistently <60 mL/min signify possible Chronic Kidney Disease.    Anion gap 7 5 - 15    Comment: Performed at [facility], [address]  [place],  [address]  Urine Drug Screen, Qualitative     Status: None   Collection Time: 02/22/17  7:45 AM  Result Value Ref Range   Tricyclic, Ur Screen NONE DETECTED NONE DETECTED   Amphetamines, Ur Screen NONE DETECTED NONE DETECTED   MDMA (Ecstasy)Ur Screen NONE DETECTED NONE DETECTED   Cocaine Metabolite,Ur [place] NONE DETECTED NONE DETECTED   Opiate, Ur Screen NONE DETECTED NONE DETECTED   Phencyclidine (PCP) Ur S NONE DETECTED NONE DETECTED   Cannabinoid 50 Ng, Ur [place] NONE DETECTED NONE DETECTED   Barbiturates, Ur Screen NONE DETECTED NONE DETECTED   Benzodiazepine, Ur Scrn NONE DETECTED NONE DETECTED   Methadone Scn, Ur NONE DETECTED NONE DETECTED  Comment: (NOTE) Tricyclics + metabolites, urine    Cutoff 1000 ng/mL Amphetamines + metabolites, urine  Cutoff 1000 ng/mL MDMA (Ecstasy), urine              Cutoff 500 ng/mL Cocaine Metabolite, urine          Cutoff 300 ng/mL Opiate + metabolites, urine        Cutoff 300 ng/mL Phencyclidine (PCP), urine         Cutoff 25 ng/mL Cannabinoid, urine                 Cutoff 50 ng/mL Barbiturates + metabolites, urine  Cutoff 200 ng/mL Benzodiazepine, urine              Cutoff 200 ng/mL Methadone, urine                   Cutoff 300 ng/mL The urine drug screen provides only a preliminary, unconfirmed analytical test result and should not be used for non-medical purposes. Clinical consideration and professional judgment should be applied to any positive drug screen result due to possible interfering substances. A more specific alternate chemical method must be used in order to obtain a confirmed analytical result. Gas chromatography / mass spectrometry (GC/MS) is the preferred confirmat ory method. Performed at [facility], [address], [place], [place] [address]     Current Facility-Administered Medications  Medication Dose Route Frequency Provider Last Rate Last Dose  . losartan (COZAAR) tablet 100 mg  100 mg Oral Daily  Orbie Pyo, MD   100 mg at 02/23/17 3267   And  . hydrochlorothiazide (MICROZIDE) capsule 12.5 mg  12.5 mg Oral Daily Orbie Pyo, MD   12.5 mg at 02/23/17 0946  . levETIRAcetam (KEPPRA) tablet 500 mg  500 mg Oral BID Lavonia Drafts, MD   500 mg at 02/23/17 0946  . LORazepam (ATIVAN) tablet 1 mg  1 mg Oral STAT McShane, Gerda Diss, MD       Current Outpatient Medications  Medication Sig Dispense Refill  . levETIRAcetam (KEPPRA) 500 MG tablet Take 500-750 mg by mouth 2 (two) times daily. Take 500 mg every morning and take 750 mg every evening.    Marland Kitchen losartan-hydrochlorothiazide (HYZAAR) 100-12.5 MG tablet Take 1 tablet by mouth daily.      Musculoskeletal: Strength & Muscle Tone: within normal limits Gait & Station: unsteady Patient leans: N/A  Psychiatric Specialty Exam: Physical Exam  Nursing note and vitals reviewed. Constitutional: He appears well-developed and well-nourished.  HENT:  Head: Normocephalic and atraumatic.  Eyes: Conjunctivae are normal. Pupils are equal, round, and reactive to light.  Neck: Normal range of motion.  Cardiovascular: Regular rhythm and normal heart sounds.  Respiratory: Effort normal. No respiratory distress.  GI: Soft.  Musculoskeletal: Normal range of motion.  Neurological: He is alert.  Skin: Skin is warm and dry.  Psychiatric: His affect is not labile. His speech is tangential. He is agitated. He is not aggressive. Thought content is paranoid. Cognition and memory are impaired. He expresses impulsivity. He expresses no homicidal and no suicidal ideation. He exhibits abnormal recent memory and abnormal remote memory.    Review of Systems  Constitutional: Negative.   HENT: Negative.   Eyes: Negative.   Respiratory: Negative.   Cardiovascular: Negative.   Gastrointestinal: Negative.   Musculoskeletal: Negative.   Skin: Negative.   Neurological: Negative.   Psychiatric/Behavioral: Negative for depression,  hallucinations, memory loss, substance abuse and suicidal ideas. The patient is not  nervous/anxious and does not have insomnia.     Blood pressure (!) 121/98, pulse (!) 59, temperature 97.6 F (36.4 C), temperature source Oral, resp. rate 18, height _0  (1.753 m), weight 68 kg (150 lb), SpO2 100 %.Body mass index is 22.15 kg/m.  General Appearance: Disheveled  Eye Contact:  Fair  Speech:  Slow  Volume:  Decreased  Mood:  Irritable  Affect:  Congruent and Constricted  Thought Process:  Disorganized  Orientation:  Full (Time, Place, and Person)  Thought Content:  Paranoid Ideation and Rumination  Suicidal Thoughts:  No  Homicidal Thoughts:  No  Memory:  Immediate;   Poor Recent;   Poor Remote;   Fair  Judgement:  Impaired  Insight:  Shallow  Psychomotor Activity:  Restlessness  Concentration:  Concentration: Poor  Recall:  Poor  Fund of Knowledge:  Fair  Language:  Fair  Akathisia:  No  Handed:  Right  AIMS (if indicated):     Assets:  Agricultural consultant Resilience  ADL's:  Impaired  Cognition:  Impaired,  Mild  Sleep:        Treatment Plan Summary: Medication management and Plan 72 year old man who has mild dementia and a seizure disorder.  He has probably returned to his baseline at this point.  He is oriented to where he is him to the correct time although he gets a little confused about the situation.  His short-term memory is impaired.  Nevertheless he is not currently psychotic and not having an acute mood syndrome.  He gets a little bit agitated but is able to be calm down fairly easily.  At this point I do not think his need really is a geriatric psychiatry unit but rather social work assistance in placement.  I have put in a new social work consult and reviewed this with the emergency room physician.  No need to change medicine for now.  Disposition: Patient does not meet criteria for psychiatric inpatient admission. Supportive  therapy provided about ongoing stressors.  Alethia Berthold, MD 02/23/2017 5:29 PM

## 2017-02-23 NOTE — ED Notes (Signed)
Pt ambulated to bathroom without asistance

## 2017-02-23 NOTE — ED Notes (Signed)
Pt ambulated to bathroom without any complications.

## 2017-02-23 NOTE — ED Notes (Signed)
Patient provided meal tray. He remains calm, and cooperative at this time

## 2017-02-23 NOTE — ED Notes (Addendum)
telesitter remains. Pt calm and cooperative. Ambulatory around room with steady gait. Redirected to bed.

## 2017-02-23 NOTE — ED Notes (Signed)
Patient continues to be paranoid and agitated and insiist on leaving. He attempts to wander halls to look for belongings, and does not comply with direction to return to room. Security staff required to escort patient back to room. MD made aware.

## 2017-02-23 NOTE — ED Notes (Signed)
Pt given meal tray.

## 2017-02-23 NOTE — ED Provider Notes (Signed)
-----------------------------------------   5:38 PM on 02/23/2017 -----------------------------------------   Patient was signed out to me this afternoon, he has been here for a prolonged period.  He does have a history of seizures and prolonged postictal confusion afterwards.  At this time, he is likely back to his baseline which is dementia according to psychiatry who just evaluated him.  Patient, before being seen by psychiatry, did come out in the hallway and was adamant that we give him all of his equipment and teeth etc. and send him home patient was not violent, though he was very insistent my concern with her that if things might escalate..  Was unable to redirect him despite multiple efforts, and did give him a mild sedative which he has had here before.  There is concerned about the safety of doing so however.  He will be evaluated by social work here and I have talked to them, psychiatry does not feel he any longer requires inpatient psychiatric care and they feel comfortable with other disposition so we will see about working with social work about how to get him safely home.   Schuyler Amor, MD 02/23/17 1740

## 2017-02-23 NOTE — ED Notes (Signed)
BEHAVIORAL HEALTH ROUNDING  Patient sleeping: No.  Patient alert and oriented: yes  Behavior appropriate: Yes. ; If no, describe:  Nutrition and fluids offered: Yes  Toileting and hygiene offered: Yes  Sitter present: telesitter, Q 15 min safety rounds and observation.  Law enforcement present: Yes ODS  Pt up to the bathroom with standby assist of Clarise Cruz, EDT.

## 2017-02-23 NOTE — ED Notes (Signed)
TV cut off in pt room and light cut off per pt request. Pt is lying in bed at this time.

## 2017-02-23 NOTE — ED Notes (Signed)
ENVIRONMENTAL ASSESSMENT  Potentially harmful objects out of patient reach: Yes.  Personal belongings secured: Yes.  Patient dressed in hospital provided attire only: Yes.  Plastic bags out of patient reach: Yes.  Patient care equipment (cords, cables, call bells, lines, and drains) shortened, removed, or accounted for: Yes.  Equipment and supplies removed from bottom of stretcher: Yes.  Potentially toxic materials out of patient reach: Yes.  Sharps container removed or out of patient reach: Yes.   BEHAVIORAL HEALTH ROUNDING Patient sleeping: Yes.   Patient alert and oriented: not applicable SLEEPING Behavior appropriate: Yes.  ; If no, describe: SLEEPING Nutrition and fluids offered: No SLEEPING Toileting and hygiene offered: NoSLEEPING Sitter present: telesitter plus  Q 15 min safety rounds and observation. Law enforcement present: Yes ODS

## 2017-02-23 NOTE — ED Notes (Signed)
Patient provided meal tray. He is alert, calm, and cooperative at this time.

## 2017-02-23 NOTE — ED Notes (Signed)
PT IVC/ Pending Placement 

## 2017-02-24 MED ORDER — HALOPERIDOL LACTATE 5 MG/ML IJ SOLN
10.0000 mg | Freq: Once | INTRAMUSCULAR | Status: AC
  Administered 2017-02-24: 10 mg via INTRAMUSCULAR
  Filled 2017-02-24: qty 2

## 2017-02-24 MED ORDER — LORAZEPAM 1 MG PO TABS
1.0000 mg | ORAL_TABLET | Freq: Once | ORAL | Status: AC
  Administered 2017-02-24: 1 mg via ORAL

## 2017-02-24 NOTE — ED Notes (Signed)
Pt remains alert, calm and cooperative. telesitter remains.

## 2017-02-24 NOTE — ED Notes (Signed)
Pt to doorway speaking about going to look for apartment. Redirected to room.

## 2017-02-24 NOTE — ED Notes (Signed)
PATIENT REFUSED SHOWER.

## 2017-02-24 NOTE — ED Notes (Signed)
Pt continuously to doorway asking for his belongings to leave. Redirected to room by this RN

## 2017-02-24 NOTE — ED Notes (Signed)
IVC papers rescinded by Dr Jimmye Norman. Fabian Sharp RN and ODS security Wynetta Emery) aware.

## 2017-02-24 NOTE — ED Notes (Signed)
Tree surgeon at bedside with pt at this time.

## 2017-02-24 NOTE — ED Notes (Signed)
Pt out into hallway,  States he needs to go find an apartment.  Pt not easily redirected.  Pt told to go back into room, pt clearly not able to understand instructions.  Pt standing in hallway with security and ED staff around.  Pt alert but confused. See mar for meds given.  After several minutes pt did return to his room as instructed.

## 2017-02-24 NOTE — ED Provider Notes (Signed)
Patient has been cleared for discharge with his family member.  Reportedly he will go to the homeless shelter.   Earleen Newport, MD 02/24/17 [phone]

## 2017-02-24 NOTE — ED Provider Notes (Signed)
-----------------------------------------   7:11 AM on 02/24/2017 -----------------------------------------   Blood pressure 140/87, pulse 80, temperature 97.6 F (36.4 C), temperature source Oral, resp. rate 19, height 1.753 m (5\' 9" ), weight 68 kg (150 lb), SpO2 100 %.  The patient had no acute events since last update.  Calm and cooperative at this time.  Dr. Burlene Arnt spoke with social work last shift and they will try to assist the patient with his needs so that he can get home.    Hinda Kehr, MD 02/24/17 [phone]

## 2017-02-24 NOTE — ED Notes (Signed)
Pt out into hallway, not easily redirected.  Security with pt and ED staff with pt.  Pt not following commands, pt swinging at staff and yelling.  3 person assist needed to help pt back into room.  Pt non-cooperative. After several minutes of talking to pt he was able to calm down.  However, pt would not cooperate with staff.  Pt remains confused and states he wants to leave and go back to his apartment and get his belongings. Pt assisted back into room again by 3 security staff members.  Pt given IM meds.  Pt then calmer and staying in room with redirection by security staff.

## 2017-02-24 NOTE — ED Notes (Signed)
Pt up to quad bathroom without complication. Returned to room. Alert, calm. telesitter remains

## 2017-02-24 NOTE — ED Notes (Signed)
Social worker, Arts development officer, going over information with pts brother.

## 2017-02-24 NOTE — ED Notes (Signed)
Ivc /pending placement 

## 2017-02-24 NOTE — ED Notes (Signed)
Brother here to pick up patient

## 2017-02-24 NOTE — Clinical Social Work Note (Addendum)
CSW received Social Work consult for placement. CSW spoke with brother-Jeremy [name] at [phone] to inform pt is ready for discharge. Brother will transport pt to shelter at 1:30pm. Pt does not have Medicaid, is his own Guardian and payee, and has Social Security income. CSW and brother discussed how to pursue Guardianship, Medicaid, and placement in an Assisted Living or Family care home for pt. At this time, pt does not have a source for payment in an ALF. Brother presents as very supportive. Brother to work on becoming pt's Guardian, establishing Medicaid, and securing permanent placement for pt.   CSW staffed case with [place] Work Surveyor, quantity [employer]. CSW provided and explained resources to assist brother with these tasks. Brother is aware FL-2 and TB test are to be completed by PCP, at follow up appointment as soon as possible. Brother also aware that Special Assistance application can take up to 45 days to approve and would need to be applied for immediately. CSW made RN aware. CSW signing off as no further Social Work needs identified.   Jeremy [name], Jeremy [name], [place] Social Worker-ED [phone]

## 2017-02-26 LAB — LEVETIRACETAM LEVEL: LEVETIRACETAM: NOT DETECTED ug/mL (ref 10.0–40.0)

## 2017-03-11 ENCOUNTER — Emergency Department: Payer: Medicare HMO

## 2017-03-11 ENCOUNTER — Emergency Department
Admission: EM | Admit: 2017-03-11 | Discharge: 2017-03-17 | Disposition: A | Discharge status: Home / Self Care | Payer: Medicare HMO | Attending: Emergency Medicine | Admitting: Emergency Medicine

## 2017-03-11 ENCOUNTER — Other Ambulatory Visit: Payer: Self-pay

## 2017-03-11 ENCOUNTER — Encounter: Payer: Self-pay | Admitting: Emergency Medicine

## 2017-03-11 DIAGNOSIS — Z87891 Personal history of nicotine dependence: Secondary | ICD-10-CM | POA: Insufficient documentation

## 2017-03-11 DIAGNOSIS — F0390 Unspecified dementia without behavioral disturbance: Secondary | ICD-10-CM

## 2017-03-11 DIAGNOSIS — R911 Solitary pulmonary nodule: Secondary | ICD-10-CM | POA: Diagnosis not present

## 2017-03-11 DIAGNOSIS — R451 Restlessness and agitation: Secondary | ICD-10-CM

## 2017-03-11 DIAGNOSIS — F068 Other specified mental disorders due to known physiological condition: Secondary | ICD-10-CM | POA: Diagnosis not present

## 2017-03-11 DIAGNOSIS — F0391 Unspecified dementia with behavioral disturbance: Secondary | ICD-10-CM | POA: Diagnosis not present

## 2017-03-11 DIAGNOSIS — I10 Essential (primary) hypertension: Secondary | ICD-10-CM | POA: Insufficient documentation

## 2017-03-11 DIAGNOSIS — J984 Other disorders of lung: Secondary | ICD-10-CM | POA: Diagnosis not present

## 2017-03-11 DIAGNOSIS — R4182 Altered mental status, unspecified: Secondary | ICD-10-CM | POA: Diagnosis not present

## 2017-03-11 DIAGNOSIS — R569 Unspecified convulsions: Secondary | ICD-10-CM

## 2017-03-11 DIAGNOSIS — F23 Brief psychotic disorder: Secondary | ICD-10-CM | POA: Diagnosis present

## 2017-03-11 DIAGNOSIS — R441 Visual hallucinations: Secondary | ICD-10-CM

## 2017-03-11 HISTORY — DX: Unspecified dementia, unspecified severity, without behavioral disturbance, psychotic disturbance, mood disturbance, and anxiety: F03.90

## 2017-03-11 LAB — URINALYSIS, COMPLETE (UACMP) WITH MICROSCOPIC
Bacteria, UA: NONE SEEN
Bilirubin Urine: NEGATIVE
Glucose, UA: NEGATIVE mg/dL
KETONES UR: 5 mg/dL — AB
Leukocytes, UA: NEGATIVE
Nitrite: NEGATIVE
Protein, ur: 30 mg/dL — AB
Specific Gravity, Urine: 1.013 (ref 1.005–1.030)
pH: 5 (ref 5.0–8.0)

## 2017-03-11 LAB — URINE DRUG SCREEN, QUALITATIVE (ARMC ONLY)
AMPHETAMINES, UR SCREEN: NOT DETECTED
Barbiturates, Ur Screen: NOT DETECTED
Benzodiazepine, Ur Scrn: NOT DETECTED
CANNABINOID 50 NG, UR ~~LOC~~: NOT DETECTED
Cocaine Metabolite,Ur ~~LOC~~: NOT DETECTED
MDMA (ECSTASY) UR SCREEN: NOT DETECTED
METHADONE SCREEN, URINE: NOT DETECTED
Opiate, Ur Screen: NOT DETECTED
Phencyclidine (PCP) Ur S: NOT DETECTED
TRICYCLIC, UR SCREEN: NOT DETECTED

## 2017-03-11 LAB — COMPREHENSIVE METABOLIC PANEL
ALT: 17 U/L (ref 17–63)
AST: 51 U/L — ABNORMAL HIGH (ref 15–41)
Albumin: 4.5 g/dL (ref 3.5–5.0)
Alkaline Phosphatase: 109 U/L (ref 38–126)
Anion gap: 11 (ref 5–15)
BUN: 10 mg/dL (ref 6–20)
CHLORIDE: 104 mmol/L (ref 101–111)
CO2: 24 mmol/L (ref 22–32)
Calcium: 9.2 mg/dL (ref 8.9–10.3)
Creatinine, Ser: 1.36 mg/dL — ABNORMAL HIGH (ref 0.61–1.24)
GFR, EST AFRICAN AMERICAN: 59 mL/min — AB (ref >60)
GFR, EST NON AFRICAN AMERICAN: 51 mL/min — AB (ref >60)
Glucose, Bld: 99 mg/dL (ref 65–99)
POTASSIUM: 4 mmol/L (ref 3.5–5.1)
SODIUM: 139 mmol/L (ref 135–145)
Total Bilirubin: 1.2 mg/dL (ref 0.3–1.2)
Total Protein: 8.3 g/dL — ABNORMAL HIGH (ref 6.5–8.1)

## 2017-03-11 LAB — CBC
HCT: 36.6 % — ABNORMAL LOW (ref 40.0–52.0)
Hemoglobin: 12.3 g/dL — ABNORMAL LOW (ref 13.0–18.0)
MCH: 29.9 pg (ref 26.0–34.0)
MCHC: 33.5 g/dL (ref 32.0–36.0)
MCV: 89.2 fL (ref 80.0–100.0)
PLATELETS: 278 10*3/uL (ref 150–440)
RBC: 4.11 MIL/uL — AB (ref 4.40–5.90)
RDW: 14.8 % — ABNORMAL HIGH (ref 11.5–14.5)
WBC: 7.8 10*3/uL (ref 3.8–10.6)

## 2017-03-11 LAB — AMMONIA: Ammonia: 38 umol/L — ABNORMAL HIGH (ref 9–35)

## 2017-03-11 LAB — ACETAMINOPHEN LEVEL

## 2017-03-11 LAB — SALICYLATE LEVEL

## 2017-03-11 LAB — ETHANOL

## 2017-03-11 MED ORDER — LORAZEPAM 2 MG/ML IJ SOLN
2.0000 mg | Freq: Once | INTRAMUSCULAR | Status: AC
  Administered 2017-03-11: 2 mg via INTRAMUSCULAR

## 2017-03-11 MED ORDER — LORAZEPAM 2 MG/ML IJ SOLN
INTRAMUSCULAR | Status: AC
  Administered 2017-03-11: 2 mg via INTRAMUSCULAR
  Filled 2017-03-11: qty 1

## 2017-03-11 MED ORDER — HALOPERIDOL LACTATE 5 MG/ML IJ SOLN
INTRAMUSCULAR | Status: AC
  Administered 2017-03-11: 5 mg via INTRAMUSCULAR
  Filled 2017-03-11: qty 1

## 2017-03-11 MED ORDER — ONDANSETRON HCL 4 MG/2ML IJ SOLN: 4.0000 mg | Freq: Once | INTRAMUSCULAR | Status: DC

## 2017-03-11 MED ORDER — HALOPERIDOL LACTATE 5 MG/ML IJ SOLN
5.0000 mg | Freq: Once | INTRAMUSCULAR | Status: AC
  Administered 2017-03-11: 5 mg via INTRAMUSCULAR

## 2017-03-11 NOTE — ED Notes (Signed)
IVC PENDING  CONSULT ?

## 2017-03-11 NOTE — ED Notes (Signed)
Patient climbing out of bed and yelling loudly, swinging arms above his head. Patient also appears to be looking for something, with quick, wide-eyed glances around the hallway. Nursing staff, MD and ODS officers with patient attempting to redirect and get patient back into bed. Patient able to be directed to sit on edge of bed at this time.

## 2017-03-11 NOTE — ED Notes (Signed)
Resumed care from [facility]

## 2017-03-11 NOTE — ED Notes (Signed)
Pt ambulated to toilet in room 25. Pt did not urinate. Lanny Hurst ED medic at bedside to sit with pt.

## 2017-03-11 NOTE — ED Provider Notes (Addendum)
[facility] Emergency Department Provider Note  ____________________________________________  Time seen: Approximately 12:41 PM  I have reviewed the triage vital signs and the nursing notes.   HISTORY  Chief Complaint Psychiatric Evaluation    HPI Jeremy [name] is a 72 y.o. male with a history of advanced dementia, homelessness, seizure disorder on Keppra, HTN presenting for altered mental status.  The patient is unable to give any history due to his altered mental status. Per report, the patient was found wandering the streets with his pants off and his brother was called.  The patient is acutely psychotic and having visual hallucinations and is unable to comply with any history or physical examination.  Past Medical History:  Diagnosis Date  . Dementia   . Hypertension   . Seizures [facility])     Patient Active Problem List   Diagnosis Date Noted  . 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 Dementia with behavioral disturbance 11/02/2015  . Hypertension 11/02/2015  . Seizures ([place]) 11/02/2015  . Incarcerated hernia     Past Surgical History:  Procedure Laterality Date  . none      Current Outpatient Rx  . Order #: [id number] Class: Historical Med  . Order #: [id number] Class: Historical Med    Allergies Patient has no known allergies.  History reviewed. No pertinent family history.  Social History Social History   Tobacco Use  . Smoking status: Former Smoker    Years: 25.00  . Smokeless tobacco: Never Used  Substance Use Topics  . Alcohol use: No    Comment: quit etoh x 3 months ago  . Drug use: Yes    Types: Cocaine, IV    Comment: quit 3 months ago - x 17 year drug and alcohol use    Review of Systems Unable to obtain due to patient altered mental status. ____________________________________________   PHYSICAL EXAM:  VITAL SIGNS: ED Triage Vitals [03/11/17 1151]  Enc Vitals Group     BP (!) 190/99     Pulse Rate (!) 110      Resp (!) 22     Temp 99.8 F (37.7 C)     Temp Source Oral     SpO2 97 %     Weight 150 lb (68 kg)     Height 5\' 9"  (1.753 m)     Head Circumference      Peak Flow      Pain Score      Pain Loc      Pain Edu?      Excl. in [place]?     Constitutional: The patient is alert, agitated, and responding to internal stimulus.   Eyes: Conjunctivae are normal.  EOMI. No scleral icterus. Head: Atraumatic. Nose: No congestion/rhinnorhea.  No obvious swelling of the nose. Mouth/Throat: Mucous membranes are dry.  No malocclusion or dental injury..  Neck: No stridor.  Supple.  No meningismus. Cardiovascular: Fast rate Respiratory: Normal respiratory effort.  No accessory muscle use or retractions. CV, Resp and Abd examinations are limited due to pt aggressive behavior - he attempts to hit me when I approach him for exam. Musculoskeletal: moves all extremities well Neurologic:  Alert and agitated.  Speech is clear.  Face and smile are symmetric.  EOMI.  Moves all extremities well. Unstable gait. Skin:  Skin is warm, dry and intact. No rash noted. Psychiatric: Agitated mood and affect.  Clearly responding to internal stimulus.  Picking at things that are in the air. ____________________________________________   LABS (all labs  ordered are listed, but only abnormal results are displayed)  Labs Reviewed  COMPREHENSIVE METABOLIC PANEL - Abnormal; Notable for the following components:      Result Value   Creatinine, Ser 1.36 (*)    Total Protein 8.3 (*)    AST 51 (*)    GFR calc non Af Amer 51 (*)    GFR calc Af Amer 59 (*)    All other components within normal limits  ACETAMINOPHEN LEVEL - Abnormal; Notable for the following components:   Acetaminophen (Tylenol), Serum <10 (*)    All other components within normal limits  CBC - Abnormal; Notable for the following components:   RBC 4.11 (*)    Hemoglobin 12.3 (*)    HCT 36.6 (*)    RDW 14.8 (*)    All other components within normal limits   ETHANOL  SALICYLATE LEVEL  URINE DRUG SCREEN, QUALITATIVE ([facility] ONLY)  URINALYSIS, COMPLETE (UACMP) WITH MICROSCOPIC  AMMONIA   ____________________________________________  EKG   ED ECG REPORT I, Eula Listen, the attending physician, personally viewed and interpreted this ECG.   Date: 03/11/2017  EKG Time: 1552  Rate: 79  Rhythm: normal sinus rhythm  Axis: leftward  Intervals:none  ST&T Change: No STEMI   This EKG is compared to 02/20/17 and 02/25/17.     ____________________________________________  RADIOLOGY  No results found.  ____________________________________________   PROCEDURES  Procedure(s) performed: None  Procedures  Critical Care performed: No ____________________________________________   INITIAL IMPRESSION / ASSESSMENT AND PLAN / ED COURSE  Pertinent labs & imaging results that were available during my care of the patient were reviewed by me and considered in my medical decision making (see chart for details).  72 y.o. male with a history of advanced dementia and homelessness, seizure disorder, presenting for altered mental status.  The patient is hypertensive and tachycardic in the setting of severe agitation.  He stood up from his bed, was yelling, and was able to be brought back into his bed but given his agitation he was given Haldol and Ativan for sedation.  It is possible the patient's symptoms are due to his advanced dementia, but will get a CT scan to rule out any intracranial process, we also check him for infection with a chest x-ray and a urinalysis.  Laboratory studies including electrolytes are also pending.  The patient has been placed under involuntary commitment and will proceed with his medical and psychiatric workup.  ----------------------------------------- 1:31 PM on 03/11/2017 ----------------------------------------- The patient's workup in the emergency department has been reassuring at this junction.  He has renal  insufficiency which is actually improved compared to prior, and negative alcohol and salicylate level, Tylenol level less than 10, and a chronic unchanged anemia.  I am awaiting the results of his imaging as well as urine tests.  At this time, the patient continues to be hemodynamically stable and resting comfortably.  ----------------------------------------- 3:43 PM on 03/11/2017 -----------------------------------------  At this time, the patient is medically cleared and awaiting psychiatric disposition.  The patient CT head does not show any acute findings.  I reviewed the patient's medical chart. ____________________________________________  FINAL CLINICAL IMPRESSION(S) / ED DIAGNOSES  Final diagnoses:  Agitation  Altered mental status, unspecified altered mental status type  Visual hallucinations         NEW MEDICATIONS STARTED DURING THIS VISIT:  New Prescriptions   No medications on file      Eula Listen, MD 03/11/17 1332    Eula Listen, MD 03/11/17 1558

## 2017-03-11 NOTE — ED Notes (Signed)
IVC papers initiated by MD Mervin Kung, Charge RN Marya Amsler, and ODS Officer [place] made aware.

## 2017-03-11 NOTE — ED Notes (Signed)
Patient's belongings placed in 3 bags and put in [place] locker room. Patient dressed in hospital scrubs and placed in low bed and given warm blanket. Safety sitter at bedside within arms reach.

## 2017-03-11 NOTE — ED Notes (Addendum)
Finished dressing pt out with ED medic Lanny Hurst. Pt jeans and underwear removed and placed with rest of belongings. Pt now has 3 pt belongings bags.

## 2017-03-11 NOTE — ED Notes (Signed)
Patient in and out cathed for urine specimen by this RN and Lanny Hurst, EMT-P. Patient tolerated procedure well.

## 2017-03-11 NOTE — Consult Note (Signed)
[place] Psychiat[facility] Consult   Reason for Consult: Consult for 72 year old man with a history of intermittent delirium Referring Physician: Mariea Clonts Patient Identification: Jeremy [name] MRN:  [id number] Principal Diagnosis: Postictal psychosis Diagnosis:   Patient Active Problem List   Diagnosis Date Noted  . 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 Dementia with behavioral disturbance [F03.91] 11/02/2015  . Hypertension [I10] 11/02/2015  . Seizures ([place]) [R56.9] 11/02/2015  . Incarcerated hernia [K46.0]     Total Time spent with patient: 30 minutes  Subjective:   Jeremy [name] is a 72 y.o. male patient admitted with patient is currently sedated not responsive and not able to give any history.Marland Kitchen  HPI: Chart reviewed.  Patient is familiar to me from previous encounters.  This is a 72 year old man with a history of dementia who has presented multiple times to the emergency room with acute delirium and agitation.  The only report available right now is that he once again was found agitated wandering around in the street with his pants down.  Unclear to me exactly what his living situation is or whether he has been still getting his medication regularly.  Social history: Patient's brother is his closest contact.  In the past the patient had been living independently.  It is not clear to me exactly what the correct situation is right now or who is watching over him.  Medical history: Patient has a history of seizure disorder  Substance abuse history: No evidence that he has recently been drinking or abusing substances  Past Psychiatric History: Patient has been seen several times previously in the emergency room under similar circumstances and in those episodes it always turned out that he had probably had a seizure possibly related to medicine noncompliance and that he remained postictal with confusion and delirium for as much as a day.  After that he gradually returned  to his baseline which is a mild degree of dementia but not psychotic or delirious.  Risk to Self: Is patient at risk for suicide?: No Risk to Others:   Prior Inpatient Therapy:   Prior Outpatient Therapy:    Past Medical History:  Past Medical History:  Diagnosis Date  . Dementia   . Hypertension   . Seizures ([place])     Past Surgical History:  Procedure Laterality Date  . none     Family History: History reviewed. No pertinent family history. Family Psychiatric  History: Unknown Social History:  Social History   Substance and Sexual Activity  Alcohol Use No   Comment: quit etoh x 3 months ago     Social History   Substance and Sexual Activity  Drug Use Yes  . Types: Cocaine, IV   Comment: quit 3 months ago - x 17 year drug and alcohol use    Social History   Socioeconomic History  . Marital status: Single    Spouse name: None  . Number of children: None  . Years of education: None  . Highest education level: None  Social Needs  . Financial resource strain: None  . Food insecurity - worry: None  . Food insecurity - inability: None  . Transportation needs - medical: None  . Transportation needs - non-medical: None  Occupational History  . None  Tobacco Use  . Smoking status: Former Smoker    Years: 25.00  . Smokeless tobacco: Never Used  Substance and Sexual Activity  . Alcohol use: No    Comment: quit etoh x 3 months ago  .  Drug use: Yes    Types: Cocaine, IV    Comment: quit 3 months ago - x 17 year drug and alcohol use  . Sexual activity: None  Other Topics Concern  . None  Social History Narrative  . None   Additional Social History:    Allergies:  No Known Allergies  Labs:  Results for orders placed or performed during the hospital encounter of 03/11/17 (from the past 48 hour(s))  Urine Drug Screen, Qualitative     Status: None   Collection Time: 03/11/17 11:52 AM  Result Value Ref Range   Tricyclic, Ur Screen NONE DETECTED NONE DETECTED    Amphetamines, Ur Screen NONE DETECTED NONE DETECTED   MDMA (Ecstasy)Ur Screen NONE DETECTED NONE DETECTED   Cocaine Metabolite,Ur [place] NONE DETECTED NONE DETECTED   Opiate, Ur Screen NONE DETECTED NONE DETECTED   Phencyclidine (PCP) Ur S NONE DETECTED NONE DETECTED   Cannabinoid 50 Ng, Ur [place] NONE DETECTED NONE DETECTED   Barbiturates, Ur Screen NONE DETECTED NONE DETECTED   Benzodiazepine, Ur Scrn NONE DETECTED NONE DETECTED   Methadone Scn, Ur NONE DETECTED NONE DETECTED    Comment: (NOTE) Tricyclics + metabolites, urine    Cutoff 1000 ng/mL Amphetamines + metabolites, urine  Cutoff 1000 ng/mL MDMA (Ecstasy), urine              Cutoff 500 ng/mL Cocaine Metabolite, urine          Cutoff 300 ng/mL Opiate + metabolites, urine        Cutoff 300 ng/mL Phencyclidine (PCP), urine         Cutoff 25 ng/mL Cannabinoid, urine                 Cutoff 50 ng/mL Barbiturates + metabolites, urine  Cutoff 200 ng/mL Benzodiazepine, urine              Cutoff 200 ng/mL Methadone, urine                   Cutoff 300 ng/mL The urine drug screen provides only a preliminary, unconfirmed analytical test result and should not be used for non-medical purposes. Clinical consideration and professional judgment should be applied to any positive drug screen result due to possible interfering substances. A more specific alternate chemical method must be used in order to obtain a confirmed analytical result. Gas chromatography / mass spectrometry (GC/MS) is the preferred confirmat ory method. Performed at [facility], [address] [place], [place] [address]   Urinalysis, Complete w Microscopic     Status: Abnormal   Collection Time: 03/11/17 11:52 AM  Result Value Ref Range   Color, Urine YELLOW (A) YELLOW   APPearance HAZY (A) CLEAR   Specific Gravity, Urine 1.013 1.005 - 1.030   pH 5.0 5.0 - 8.0   Glucose, UA NEGATIVE NEGATIVE mg/dL   Hgb urine dipstick SMALL (A) NEGATIVE   Bilirubin Urine  NEGATIVE NEGATIVE   Ketones, ur 5 (A) NEGATIVE mg/dL   Protein, ur 30 (A) NEGATIVE mg/dL   Nitrite NEGATIVE NEGATIVE   Leukocytes, UA NEGATIVE NEGATIVE   RBC / HPF 0-5 0 - 5 RBC/hpf   WBC, UA 0-5 0 - 5 WBC/hpf   Bacteria, UA NONE SEEN NONE SEEN   Squamous Epithelial / LPF 0-5 (A) NONE SEEN   Mucus PRESENT    Hyaline Casts, UA PRESENT     Comment: Performed at [facility], [address] [place], [place] [address]  Comprehensive metabolic panel  Status: Abnormal   Collection Time: 03/11/17 11:53 AM  Result Value Ref Range   Sodium 139 135 - 145 mmol/L   Potassium 4.0 3.5 - 5.1 mmol/L   Chloride 104 101 - 111 mmol/L   CO2 24 22 - 32 mmol/L   Glucose, Bld 99 65 - 99 mg/dL   BUN 10 6 - 20 mg/dL   Creatinine, Ser 1.36 (H) 0.61 - 1.24 mg/dL   Calcium 9.2 8.9 - 10.3 mg/dL   Total Protein 8.3 (H) 6.5 - 8.1 g/dL   Albumin 4.5 3.5 - 5.0 g/dL   AST 51 (H) 15 - 41 U/L   ALT 17 17 - 63 U/L   Alkaline Phosphatase 109 38 - 126 U/L   Total Bilirubin 1.2 0.3 - 1.2 mg/dL   GFR calc non Af Amer 51 (L) >60 mL/min   GFR calc Af Amer 59 (L) >60 mL/min    Comment: (NOTE) The eGFR has been calculated using the CKD EPI equation. This calculation has not been validated in all clinical situations. eGFR's persistently <60 mL/min signify possible Chronic Kidney Disease.    Anion gap 11 5 - 15    Comment: Performed at [facility], [address] [place], [place] [address]  Ethanol     Status: None   Collection Time: 03/11/17 11:53 AM  Result Value Ref Range   Alcohol, Ethyl (B) <10 <10 mg/dL    Comment:        LOWEST DETECTABLE LIMIT FOR SERUM ALCOHOL IS 10 mg/dL FOR MEDICAL PURPOSES ONLY Performed at [facility], [address] [place], [place] [address]   Salicylate level     Status: None   Collection Time: 03/11/17 11:53 AM  Result Value Ref Range   Salicylate Lvl <4.0 2.8 - 30.0 mg/dL    Comment: Performed at [facility], [address] [place], [place] [address]  Acetaminophen level     Status: Abnormal   Collection Time: 03/11/17 11:53 AM  Result Value Ref Range   Acetaminophen (Tylenol), Serum <10 (L) 10 - 30 ug/mL    Comment:        THERAPEUTIC CONCENTRATIONS VARY SIGNIFICANTLY. A RANGE OF 10-30 ug/mL MAY BE AN EFFECTIVE CONCENTRATION FOR MANY PATIENTS. HOWEVER, SOME ARE BEST TREATED AT CONCENTRATIONS OUTSIDE THIS RANGE. ACETAMINOPHEN CONCENTRATIONS >150 ug/mL AT 4 HOURS AFTER INGESTION AND >50 ug/mL AT 12 HOURS AFTER INGESTION ARE OFTEN ASSOCIATED WITH TOXIC REACTIONS. Performed at [facility], [address] [place], [place] [address]   cbc     Status: Abnormal   Collection Time: 03/11/17 11:53 AM  Result Value Ref Range   WBC 7.8 3.8 - 10.6 K/uL   RBC 4.11 (L) 4.40 - 5.90 MIL/uL   Hemoglobin 12.3 (L) 13.0 - 18.0 g/dL   HCT 36.6 (L) 40.0 - 52.0 %   MCV 89.2 80.0 - 100.0 fL   MCH 29.9 26.0 - 34.0 pg   MCHC 33.5 32.0 - 36.0 g/dL   RDW 14.8 (H) 11.5 - 14.5 %   Platelets 278 150 - 440 K/uL    Comment: Performed at [facility], [address] [place], [place] [address]  Ammonia     Status: Abnormal   Collection Time: 03/11/17 12:55 PM  Result Value Ref Range   Ammonia 38 (H) 9 - 35 umol/L    Comment: Performed at [facility], [address] [place],  [address]    No current facility-administered medications for this encounter.  Current Outpatient Medications  Medication Sig Dispense Refill  . levETIRAcetam (KEPPRA) 500 MG tablet Take 500 mg by mouth 2 (two) times daily. Take 500 mg every morning and take 750 mg every evening.    Marland Kitchen losartan-hydrochlorothiazide (HYZAAR) 100-12.5 MG tablet Take 1 tablet by mouth daily.      Musculoskeletal: Strength & Muscle Tone: decreased Gait & Station: unable to stand Patient leans: N/A  Psychiatric Specialty Exam: Physical Exam  Nursing note and vitals reviewed. Constitutional: He appears well-developed.  HENT:   Head: Normocephalic and atraumatic.  Eyes: Conjunctivae are normal. Pupils are equal, round, and reactive to light.  Neck: Normal range of motion.  Cardiovascular: Regular rhythm and normal heart sounds.  Respiratory: Effort normal. No respiratory distress.  GI: Soft.  Musculoskeletal: Normal range of motion.  Neurological: He is alert.  Skin: Skin is warm and dry.    Review of Systems  Unable to perform ROS: Patient nonverbal    Blood pressure (!) 147/77, pulse 87, temperature 99.8 F (37.7 C), temperature source Oral, resp. rate 14, height '5\' 9"'  (1.753 m), weight 68 kg (150 lb), SpO2 96 %.Body mass index is 22.15 kg/m.  General Appearance: Disheveled  Eye Contact:  None  Speech:  Negative  Volume:  Decreased  Mood:  Negative  Affect:  Negative  Thought Process:  NA  Orientation:  Negative  Thought Content:  Negative  Suicidal Thoughts:  No  Homicidal Thoughts:  No  Memory:  Negative  Judgement:  Negative  Insight:  Negative  Psychomotor Activity:  Negative  Concentration:  Concentration: Negative  Recall:  Negative  Fund of Knowledge:  Negative  Language:  Negative  Akathisia:  Negative  Handed:  Right  AIMS (if indicated):     Assets:  Resilience  ADL's:  Impaired  Cognition:  Impaired,  Mild and Moderate  Sleep:        Treatment Plan Summary: Plan 72 year old man with a history of recurrent episodes of delirium probably related to seizures although there are probably other complications as well.  Patient appeared to be dehydrated this time as well.  Patient does not have a history of chronic psychosis or mental health problems other than mild dementia.  There is no indication for me adding any specific psychiatric medicine.  I think that social work will need to be involved with this again.  If one has not already been done I will put a social work consult in.  What ever his living situation is it probably needs to be changed as it seems like he is not able to stay  medically and physically stable enough to stay out of the hospital.  Disposition: No evidence of imminent risk to self or others at present.   Patient does not meet criteria for psychiatric inpatient admission.  Alethia Berthold, MD 03/11/2017 7:57 PM

## 2017-03-11 NOTE — ED Notes (Signed)
Upon arrival to bed in main ED, patient was accompanied by brother. Brother stated to this RN that he needed to move his car that was parked in front of the entrance. Brother has not returned to bedside at this time.

## 2017-03-11 NOTE — ED Notes (Signed)
Resumed care from [facility] at this time. Pt moved in psych bed from room 25 to room 22. Pt stayed asleep during movement. 1:1 safety sitter Sunny Schlein remains at bedside.

## 2017-03-11 NOTE — ED Triage Notes (Signed)
Family was called by police to come to pts home.  Pt was standing in street with pants down.  Hx multiple falls in past and dementia per family. Has not been acting right. Pt agitated in triage and mentioned someone watching him. Goes off tangent when asked about orientation questions.  Having difficulty figuring out how to get jacket off.

## 2017-03-11 NOTE — ED Notes (Signed)
Pt getting out of bed in hallway. Pt yelling loudly. Appears to be reaching for something in air above head. Leaning against the nurses desk. Helped back into bed by [employer] officer d/t unsteady on feet.

## 2017-03-12 ENCOUNTER — Emergency Department: Payer: Medicare HMO

## 2017-03-12 DIAGNOSIS — J984 Other disorders of lung: Secondary | ICD-10-CM | POA: Diagnosis not present

## 2017-03-12 DIAGNOSIS — R911 Solitary pulmonary nodule: Secondary | ICD-10-CM | POA: Diagnosis not present

## 2017-03-12 MED ORDER — HYDROCHLOROTHIAZIDE 12.5 MG PO CAPS
12.5000 mg | ORAL_CAPSULE | Freq: Every day | ORAL | Status: DC
  Administered 2017-03-12 – 2017-03-17 (×6): 12.5 mg via ORAL
  Filled 2017-03-12 (×6): qty 1

## 2017-03-12 MED ORDER — LEVETIRACETAM 500 MG PO TABS
500.0000 mg | ORAL_TABLET | Freq: Two times a day (BID) | ORAL | Status: DC
  Administered 2017-03-12 – 2017-03-17 (×11): 500 mg via ORAL
  Filled 2017-03-12 (×11): qty 1

## 2017-03-12 MED ORDER — LOSARTAN POTASSIUM 50 MG PO TABS
100.0000 mg | ORAL_TABLET | Freq: Every day | ORAL | Status: DC
  Administered 2017-03-12 – 2017-03-17 (×6): 100 mg via ORAL
  Filled 2017-03-12 (×6): qty 2

## 2017-03-12 MED ORDER — LOSARTAN POTASSIUM-HCTZ 100-12.5 MG PO TABS: 1.0000 | ORAL_TABLET | Freq: Every day | ORAL | Status: DC

## 2017-03-12 NOTE — Clinical Social Work Note (Addendum)
CSW consulted for "Patient's living circumstances and safety of his outpatient care needs to be assessed and possibly adjusted." CSW staffed case with Social Work Surveyor, quantity [employer].  CSW spoke with Guardianship Supervisor [employer] who stated a Information systems manager will be coming to [facility] to assess pt. FL-2 completed and awaiting EDP signature. EDP and RN aware. When signed, CSW to fax FL-2 to [place] at [phone]. Pt chest X-ray ordered. CSW updated brother. CSW continuing to follow for discharge planning.   Oretha Ellis, [place], [place] Social Worker-Ed [phone]

## 2017-03-12 NOTE — ED Provider Notes (Signed)
-----------------------------------------   5:29 PM on 03/12/2017 -----------------------------------------  CT scan of the chest shows a solitary right sided nodule with recommended CT follow-up in 6-12 months for repeat imaging.  I discussed this with the patient who gave me permission to discuss it with his brother who is his emergency contact with.  I discussed with Vicie Mutters his brother and informed him of the CT finding and need to follow-up with a repeat CT in 6-12 months.  He understands this and will stay on top of it for the patient he states.   Harvest Dark, MD 03/12/17 1735

## 2017-03-12 NOTE — ED Notes (Signed)
ED Is the patient under IVC or is there intent for IVC: Yes.   Is the patient medically cleared: Yes.   Is there vacancy in the ED BHU: Yes.   Is the population mix appropriate for patient:  Is the patient awaiting placement in inpatient or outpatient setting:  Social work consult in progress/pending  Has the patient had a psychiatric consult:  Survey of unit performed for contraband, proper placement and condition of furniture, tampering with fixtures in bathroom, shower, and each patient room: Yes.  ; Findings:  APPEARANCE/BEHAVIOR Calm and cooperative NEURO ASSESSMENT Orientation: oriented x3  Denies pain Hallucinations: No.None noted (Hallucinations)  denies Speech: Normal Gait: normal RESPIRATORY ASSESSMENT Even  Unlabored respirations  CARDIOVASCULAR ASSESSMENT Pulses equal   regular rate  Skin warm and dry   GASTROINTESTINAL ASSESSMENT no GI complaint EXTREMITIES Full ROM  PLAN OF CARE Provide calm/safe environment. Vital signs assessed twice daily. ED BHU Assessment once each 12-hour shift. Collaborate with TTS daily or as condition indicates. Assure the ED provider has rounded once each shift. Provide and encourage hygiene. Provide redirection as needed. Assess for escalating behavior; address immediately and inform ED provider.  Assess family dynamic and appropriateness for visitation as needed: Yes.  ; If necessary, describe findings:  Educate the patient/family about BHU procedures/visitation: Yes.  ; If necessary, describe findings:

## 2017-03-12 NOTE — ED Provider Notes (Signed)
-----------------------------------------   6:14 AM on 03/12/2017 -----------------------------------------   Blood pressure 118/79, pulse 69, temperature 98.3 F (36.8 C), temperature source Oral, resp. rate 16, height 5\' 9"  (1.753 m), weight 68 kg (150 lb), SpO2 98 %.  The patient had no acute events since last update.  Calm and cooperative at this time.  Disposition is pending Psychiatry/Behavioral Medicine team recommendations.     Paulette Blanch, MD 03/12/17 [phone]

## 2017-03-12 NOTE — ED Notes (Signed)
BEHAVIORAL HEALTH ROUNDING Patient sleeping: No. Patient alert and oriented: yes Behavior appropriate: Yes.  ; If no, describe:  Nutrition and fluids offered: yes Toileting and hygiene offered: Yes  Sitter present: q15 minute observations and security  monitoring Law enforcement present: Yes  ODS  

## 2017-03-12 NOTE — NC FL2 (Signed)
  [place] LEVEL OF CARE SCREENING TOOL     IDENTIFICATION  Patient Name: Jeremy [name] Birthdate: 09/24/1945 Sex: male Admission Date (Current Location): 03/11/2017  [place] and [place] Number:  Engineering geologist and Address:  [facility], [address] [place], [place] [address]      Provider Number: [id number]  Attending Physician Name and Address:  No att. providers found  Relative Name and Phone Number:  Ismail Graziani [phone]    Current Level of Care: Hospital Recommended Level of Care: [place], [facility], Memory Care Prior Approval Number:    Date Approved/Denied:   PASRR Number: [id number]  Discharge Plan: Domiciliary (Rest home)    Current Diagnoses: Patient Active Problem List   Diagnosis Date Noted  . 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 Dementia with behavioral disturbance 11/02/2015  . Hypertension 11/02/2015  . Seizures ([facility]) 11/02/2015  . Incarcerated hernia     Orientation RESPIRATION BLADDER Height & Weight     Self  Normal Continent Weight: 150 lb (68 kg) Height:  5\' 9"  (175.3 cm)  BEHAVIORAL SYMPTOMS/MOOD NEUROLOGICAL BOWEL NUTRITION STATUS      Continent Diet(Normal Diet)  AMBULATORY STATUS COMMUNICATION OF NEEDS Skin   Independent Verbally Normal                       Personal Care Assistance Level of Assistance  Bathing, Feeding, Dressing Bathing Assistance: Independent Feeding assistance: Independent Dressing Assistance: Independent     Functional Limitations Info  Sight, Hearing, Speech Sight Info: Adequate Hearing Info: Adequate Speech Info: Adequate    SPECIAL CARE FACTORS FREQUENCY                       Contractures Contractures Info: Not present    Additional Factors Info  Code Status, Allergies Code Status Info: Full Allergies Info: No Known Allergies           Current Medications (03/12/2017):  This is the  current hospital active medication list Current Facility-Administered Medications  Medication Dose Route Frequency Provider Last Rate Last Dose  . losartan (COZAAR) tablet 100 mg  100 mg Oral Daily Paulette Blanch, MD   100 mg at 03/12/17 1117   And  . hydrochlorothiazide (MICROZIDE) capsule 12.5 mg  12.5 mg Oral Daily Paulette Blanch, MD   12.5 mg at 03/12/17 1118  . levETIRAcetam (KEPPRA) tablet 500 mg  500 mg Oral BID Paulette Blanch, MD   500 mg at 03/12/17 1117   Current Outpatient Medications  Medication Sig Dispense Refill  . levETIRAcetam (KEPPRA) 500 MG tablet Take 500 mg by mouth 2 (two) times daily. Take 500 mg every morning and take 750 mg every evening.    Marland Kitchen losartan-hydrochlorothiazide (HYZAAR) 100-12.5 MG tablet Take 1 tablet by mouth daily.       Discharge Medications: Please see discharge summary for a list of discharge medications.  Relevant Imaging Results:  Relevant Lab Results:   Additional Information SSN: [id number]  Truitt Merle, LCSW

## 2017-03-12 NOTE — BH Assessment (Signed)
Assessment Note  Jeremy [name] is an 72 y.o. male who presents with behavioral disturbance, seizure disorder and dementia. Patient denies SI/HI and denies [place]. Family reports that patient has been exemplifying bizzare behaviors evidenced by patient standing in middle of the street with his pants down, as well as patient having conversatiions with people who are known to family members to be deceased. Patient is agitated and withdrawn desiring to sleep and not be bothered.   Diagnosis: Postictal Psychosis  Past Medical History:  Past Medical History:  Diagnosis Date  . Dementia   . Hypertension   . Seizures ([place])     Past Surgical History:  Procedure Laterality Date  . none      Family History: History reviewed. No pertinent family history.  Social History:  reports that he has quit smoking. He quit after 25.00 years of use. he has never used smokeless tobacco. He reports that he uses drugs. Drugs: Cocaine and IV. He reports that he does not drink alcohol.  Additional Social History:     CIWA: CIWA-Ar BP: 118/79 Pulse Rate: 69 COWS:    Allergies: No Known Allergies  Home Medications:  (Not in a hospital admission)  OB/GYN Status:  No LMP for male patient.  General Assessment Data Location of Assessment: [facility] ED TTS Assessment: In system Is this a Tele or Face-to-Face Assessment?: Face-to-Face Is this an Initial Assessment or a Re-assessment for this encounter?: Re-Assessment Marital status: Other (comment)(unknown) Maiden name: unknown Is patient pregnant?: No Pregnancy Status: No Living Arrangements: Alone Can pt return to current living arrangement?: Yes Admission Status: Involuntary Is patient capable of signing voluntary admission?: No Referral Source: Self/Family/Friend Insurance type: [employer] Medicare   Medical Screening Exam ([place]) Medical Exam completed: Yes  Crisis Care Plan Living Arrangements: Alone Legal Guardian: Other: Name  of Psychiatrist: n/a Name of Therapist: n/a  Education Status Is patient currently in school?: No Current Grade: n/a Highest grade of school patient has completed: unknown Name of school: unknown Contact person: unknown  Risk to self with the past 6 months Suicidal Ideation: No Has patient been a risk to self within the past 6 months prior to admission? : No Suicidal Intent: No Has patient had any suicidal intent within the past 6 months prior to admission? : No Is patient at risk for suicide?: No Suicidal Plan?: No Has patient had any suicidal plan within the past 6 months prior to admission? : No Access to Means: No What has been your use of drugs/alcohol within the last 12 months?: reports none Previous Attempts/Gestures: No How many times?: 0 Other Self Harm Risks: none reported Triggers for Past Attempts: None known Intentional Self Injurious Behavior: None Family Suicide History: No Recent stressful life event(s): Other (Comment)(failure to take medication, or as prescribed) Persecutory voices/beliefs?: No Depression: Yes Depression Symptoms: Feeling angry/irritable, Loss of interest in usual pleasures, Fatigue, Insomnia Substance abuse history and/or treatment for substance abuse?: No Suicide prevention information given to non-admitted patients: Not applicable  Risk to Others within the past 6 months Homicidal Ideation: No Does patient have any lifetime risk of violence toward others beyond the six months prior to admission? : No Thoughts of Harm to Others: No Current Homicidal Intent: No Current Homicidal Plan: No Access to Homicidal Means: No Identified Victim: n/a History of harm to others?: Yes Assessment of Violence: None Noted Violent Behavior Description: none  known Does patient have access to weapons?: No Criminal Charges Pending?: No Does patient have a  court date: No Is patient on probation?: No  Psychosis Hallucinations: None noted Delusions:  None noted  Mental Status Report Appearance/Hygiene: In scrubs Eye Contact: Fair Motor Activity: Freedom of movement, Unremarkable Speech: Incoherent, Loud Level of Consciousness: Restless, Irritable Mood: Depressed, Irritable Affect: Depressed, Irritable Anxiety Level: Moderate Thought Processes: Relevant Judgement: Impaired Orientation: Unable to assess Obsessive Compulsive Thoughts/Behaviors: None  Cognitive Functioning Concentration: Poor Memory: Recent Impaired, Remote Impaired IQ: Below Average Insight: Poor Impulse Control: Poor Appetite: Fair Weight Loss: 5 Weight Gain: 0 Sleep: Decreased Total Hours of Sleep: 6 Vegetative Symptoms: Staying in bed, Not bathing, Decreased grooming  ADLScreening [facility] Assessment Services) Patient's cognitive ability adequate to safely complete daily activities?: Yes Patient able to express need for assistance with ADLs?: Yes Independently performs ADLs?: Yes (appropriate for developmental age)  Prior Inpatient Therapy Prior Inpatient Therapy: No Prior Therapy Dates: n/a Prior Therapy Facilty/Provider(s): none known Reason for Treatment: n/a  Prior Outpatient Therapy Prior Outpatient Therapy: No Prior Therapy Dates: n/a Prior Therapy Facilty/Provider(s): n/a Reason for Treatment: n/a Does patient have an ACCT team?: No Does patient have Intensive In-House Services?  : No Does patient have [facility] services? : No Does patient have P4CC services?: No  ADL Screening (condition at time of admission) Patient's cognitive ability adequate to safely complete daily activities?: Yes Is the patient deaf or have difficulty hearing?: No Does the patient have difficulty seeing, even when wearing glasses/contacts?: No Does the patient have difficulty concentrating, remembering, or making decisions?: Yes Patient able to express need for assistance with ADLs?: Yes Does the patient have difficulty dressing or bathing?: No Independently  performs ADLs?: Yes (appropriate for developmental age) Does the patient have difficulty walking or climbing stairs?: No Weakness of Legs: None Weakness of Arms/Hands: None  Home Assistive Devices/Equipment Home Assistive Devices/Equipment: None  Therapy Consults (therapy consults require a physician order) PT Evaluation Needed: No OT Evalulation Needed: No SLP Evaluation Needed: No Abuse/Neglect Assessment (Assessment to be complete while patient is alone) Abuse/Neglect Assessment Can Be Completed: Unable to assess, patient is non-responsive or altered mental status Values / Beliefs Cultural Requests During Hospitalization: None Spiritual Requests During Hospitalization: None Consults Spiritual Care Consult Needed: No Social Work Consult Needed: No Regulatory affairs officer (For Healthcare) Does Patient Have a Medical Advance Directive?: No    Additional Information 1:1 In Past 12 Months?: No CIRT Risk: No Elopement Risk: No Does patient have medical clearance?: Yes  Child/Adolescent Assessment Running Away Risk: Denies Bed-Wetting: Denies Destruction of Property: Denies Cruelty to Animals: Denies Stealing: Denies Rebellious/Defies Authority: Denies Satanic Involvement: Denies Science writer: Denies Problems at [employer]: Denies Gang Involvement: Denies  Disposition:  Disposition Initial Assessment Completed for this Encounter: Yes Disposition of Patient: Pending Review with psychiatrist  On Site Evaluation by:   Reviewed with Physician:    Normajean Baxter, D. Min., Past.C., ICAADC, MAC, LCAS 03/12/2017 3:05 PM

## 2017-03-12 NOTE — ED Notes (Signed)
Patient observed lying in bed with eyes closed  Even, unlabored respirations observed   NAD pt appears to be sleeping  telesitter machine is set up in his room   I will continue to monitor along with every 15 minute visual observations and ongoing security monitoring

## 2017-03-12 NOTE — NC FL2 (Signed)
  [place] LEVEL OF CARE SCREENING TOOL     IDENTIFICATION  Patient Name: Jeremy [name] Birthdate: 04-14-1945 Sex: male Admission Date (Current Location): 03/11/2017  [place] and [place] Number:  Engineering geologist and Address:  [facility], [address] [place], [place] [address]      Provider Number: [id number]  Attending Physician Name and Address:  No att. providers found  Relative Name and Phone Number:  Aboubacar Matsuo [phone]    Current Level of Care: Hospital Recommended Level of Care: [place], [facility], Memory Care Prior Approval Number:    Date Approved/Denied:   PASRR Number: [id number]  Discharge Plan: Domiciliary (Rest home)    Current Diagnoses: Patient Active Problem List   Diagnosis Date Noted  . 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 Dementia with behavioral disturbance 11/02/2015  . Hypertension 11/02/2015  . Seizures ([place]) 11/02/2015  . Incarcerated hernia     Orientation RESPIRATION BLADDER Height & Weight     Self  Normal Continent Weight: 150 lb (68 kg) Height:  5\' 9"  (175.3 cm)  BEHAVIORAL SYMPTOMS/MOOD NEUROLOGICAL BOWEL NUTRITION STATUS      Continent Diet(Normal Diet)  AMBULATORY STATUS COMMUNICATION OF NEEDS Skin   Independent Verbally Normal                       Personal Care Assistance Level of Assistance  Bathing, Feeding, Dressing Bathing Assistance: Independent Feeding assistance: Independent Dressing Assistance: Independent     Functional Limitations Info  Sight, Hearing, Speech Sight Info: Adequate Hearing Info: Adequate Speech Info: Adequate    SPECIAL CARE FACTORS FREQUENCY                       Contractures Contractures Info: Not present    Additional Factors Info  Code Status, Allergies Code Status Info: Full Allergies Info: No Known Allergies           Current Medications (03/12/2017):  This is the  current hospital active medication list Current Facility-Administered Medications  Medication Dose Route Frequency Provider Last Rate Last Dose  . losartan (COZAAR) tablet 100 mg  100 mg Oral Daily Paulette Blanch, MD   100 mg at 03/12/17 1117   And  . hydrochlorothiazide (MICROZIDE) capsule 12.5 mg  12.5 mg Oral Daily Paulette Blanch, MD   12.5 mg at 03/12/17 1118  . levETIRAcetam (KEPPRA) tablet 500 mg  500 mg Oral BID Paulette Blanch, MD   500 mg at 03/12/17 1117   Current Outpatient Medications  Medication Sig Dispense Refill  . levETIRAcetam (KEPPRA) 500 MG tablet Take 500 mg by mouth 2 (two) times daily. Take 500 mg every morning and take 750 mg every evening.    Marland Kitchen losartan-hydrochlorothiazide (HYZAAR) 100-12.5 MG tablet Take 1 tablet by mouth daily.       Discharge Medications: Please see discharge summary for a list of discharge medications.  Relevant Imaging Results:  Relevant Lab Results:   Additional Information SSN: [id number]  Truitt Merle, LCSW

## 2017-03-12 NOTE — ED Notes (Signed)
Pt to CT scanner with [employer] officer and rover [employer]

## 2017-03-12 NOTE — ED Notes (Signed)

## 2017-03-12 NOTE — ED Provider Notes (Signed)
Dg Chest 2 View  Result Date: 03/12/2017 CLINICAL DATA:  Dementia, behavioral disturbance sys, seizure disorder. History of hypertension, former smoker. Placement clearance. History of pulmonary nodules. EXAM: CHEST  2 VIEW COMPARISON:  Chest x-ray of October 27, 2016 and chest CT scan of January 13, 2015. FINDINGS: The lungs are adequately inflated. There is an approximately 1.5 cm diameter soft tissue density nodule in the right pulmonary apex. There is a smaller nodule overlying the lateral aspect of the right sixth rib. A nodule versus nipple shadow is noted lateral to the left cardiac apex. These nodular densities are not appreciated on the lateral view however. The heart and pulmonary vascularity are normal. The mediastinum is normal in width. There is multilevel degenerative disc disease of the thoracic spine. 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 Chest CT scanning is recommended. No pneumonia nor other acute cardiopulmonary abnormality. Electronically Signed   By: David  Martinique M.D.   On: 03/12/2017 15:18     Chest x-ray results concerning for possible malignancy.  Based on this, further workup will be evaluated in the emergency room.  I have ordered a noncontrast CT of the chest with plan for my partner Dr. Kerman Passey who is now assuming care to follow-up on this result.  If concerns, Dr. Kerman Passey will reach out to our oncology team for further treatment and care recommendations and follow-up.   Delman Kitten, MD 03/12/17 1651

## 2017-03-12 NOTE — ED Notes (Signed)
Pt given a dinner plate.

## 2017-03-12 NOTE — ED Notes (Signed)
telesitter set up in room at this time.

## 2017-03-12 NOTE — ED Notes (Signed)
Pt bed linen changed as well as clothing due to pt urinating the bed.

## 2017-03-12 NOTE — ED Notes (Signed)
BEHAVIORAL HEALTH ROUNDING Patient sleeping: Yes.   Patient alert and oriented: eyes closed  Appears to be asleep Behavior appropriate: Yes.  ; If no, describe:  Nutrition and fluids offered: Yes  Toileting and hygiene offered: sleeping Sitter present: q 15 minute observations and security monitoring Law enforcement present: yes  ODS 

## 2017-03-12 NOTE — ED Notes (Signed)
PT IS LAYING IN BED WITH HEAD COVERED AND APPEARS TO BE ASLEEP TELESITTER BY BEDSIDE

## 2017-03-12 NOTE — ED Notes (Signed)
Pt asleep, breakfast tray placed in rm.  

## 2017-03-13 MED ORDER — TUBERCULIN PPD 5 UNIT/0.1ML ID SOLN
5.0000 [IU] | Freq: Once | INTRADERMAL | Status: AC
  Administered 2017-03-13: 5 [IU] via INTRADERMAL
  Filled 2017-03-13: qty 0.1

## 2017-03-13 NOTE — ED Notes (Signed)
Pt is sleeping at this time.

## 2017-03-13 NOTE — ED Notes (Signed)
Lunch tray provided. 

## 2017-03-13 NOTE — ED Notes (Signed)
[place] assessor [phone], reports patient needs a TB test for potential placement.

## 2017-03-13 NOTE — ED Notes (Signed)
Pt sleeping. Supper tray left at bedside

## 2017-03-13 NOTE — ED Provider Notes (Signed)
-----------------------------------------   6:30 AM on 03/13/2017 -----------------------------------------   Blood pressure 120/76, pulse 68, temperature 98.1 F (36.7 C), temperature source Oral, resp. rate 18, height 5\' 9"  (1.753 m), weight 68 kg (150 lb), SpO2 100 %.  The patient had no acute events since last update.  Calm and cooperative at this time. Patient may go to assisted living facility today; previous provider has filled out FL2 form.     Paulette Blanch, MD 03/13/17 [phone]

## 2017-03-13 NOTE — ED Notes (Signed)
Patient asleep at this time, breakfast tray offered, and left at bedside.

## 2017-03-13 NOTE — Progress Notes (Signed)
LCSW following up regarding disposition of patient  Per notes on 1/24, FL2 has been signed and this Probation officer has faxed Fl2 to [place] as instructed who is seeking placement.  Call placed to [facility] in effort to alert of the incoming fax and status of patient's placement.  Guardianship Supervisor [employer].   Barrie Lyme has received FL2 and she is working to reach Administrators to come and see patient for possible placement.   Plan: pending disposition.  Lane Hacker, MSW Clinical Social Work: Printmaker Coverage for :  [phone]

## 2017-03-14 NOTE — ED Notes (Signed)
BEHAVIORAL HEALTH ROUNDING  Patient sleeping: No.  Patient alert and oriented: yes  Behavior appropriate: Yes. ; If no, describe:  Nutrition and fluids offered: Yes  Toileting and hygiene offered: Yes  Sitter present: telesitter, Q 15 min safety rounds and observation.  Law enforcement present: Yes ODS

## 2017-03-14 NOTE — ED Notes (Signed)
We have attempted to call his brother several times - the number in the computer has been changed or disconnected  - I looked on the IVC and his brothers number on that paperwork is  Jeremy [name] 336 [phone]   Called the new number but received a blocked call  Pt is anxious and requesting to talk with his brother   I have no other numbers available  Pt reassured  He is standing by the bed

## 2017-03-14 NOTE — ED Notes (Signed)
Spoke with EDP about elevated heart rate and BP   - continue to monitor

## 2017-03-14 NOTE — ED Notes (Signed)
He is currently in the [facility]

## 2017-03-14 NOTE — ED Notes (Signed)
ENVIRONMENTAL ASSESSMENT  Potentially harmful objects out of patient reach: Yes.  Personal belongings secured: Yes.  Patient dressed in hospital provided attire only: Yes.  Plastic bags out of patient reach: Yes.  Patient care equipment (cords, cables, call bells, lines, and drains) shortened, removed, or accounted for: Yes.  Equipment and supplies removed from bottom of stretcher: Yes.  Potentially toxic materials out of patient reach: Yes.  Sharps container removed or out of patient reach: Yes.   BEHAVIORAL HEALTH ROUNDING  Patient sleeping: No.  Patient alert and oriented: yes  Behavior appropriate: Yes. ; If no, describe:  Nutrition and fluids offered: Yes  Toileting and hygiene offered: Yes  Sitter present: telesitter, Q 15 min safety rounds and observation.  Law enforcement present: Yes ODS  ED [place]  Is the patient under IVC or is there intent for IVC: Yes.  Is the patient medically cleared: Yes.  Is there vacancy in the ED BHU: Yes.  Is the population mix appropriate for patient: no.  Is the patient awaiting placement in inpatient or outpatient setting: Yes.  Has the patient had a psychiatric consult: Yes.  Survey of unit performed for contraband, proper placement and condition of furniture, tampering with fixtures in bathroom, shower, and each patient room: Yes. ; Findings: All clear  APPEARANCE/BEHAVIOR  calm, cooperative and adequate rapport can be established  NEURO ASSESSMENT  Orientation: time, place and person  Hallucinations: No.None noted (Hallucinations)  Speech: Normal  Gait: normal  RESPIRATORY ASSESSMENT  WNL  CARDIOVASCULAR ASSESSMENT  WNL  GASTROINTESTINAL ASSESSMENT  WNL  EXTREMITIES  WNL  PLAN OF CARE  Provide calm/safe environment. Vital signs assessed TID. ED BHU Assessment once each 12-hour shift. Collaborate with TTS daily or as condition indicates. Assure the ED provider has rounded once each shift. Provide and encourage  hygiene. Provide redirection as needed. Assess for escalating behavior; address immediately and inform ED provider.  Assess family dynamic and appropriateness for visitation as needed: Yes. ; If necessary, describe findings:  Educate the patient/family about BHU procedures/visitation: Yes. ; If necessary, describe findings: Pt is calm and cooperative at this time. Pt understanding and accepting of unit procedures/rules. Will continue to monitor with Q 15 min safety rounds and observation.

## 2017-03-14 NOTE — ED Notes (Addendum)
BEHAVIORAL HEALTH ROUNDING Patient sleeping: Yes.   Patient alert and oriented: not applicable SLEEPING Behavior appropriate: Yes.  ; If no, describe: SLEEPING Nutrition and fluids offered: No SLEEPING Toileting and hygiene offered: NoSLEEPING Sitter present: telesitter, Q 15 min safety rounds and observation. Law enforcement present: Yes ODS

## 2017-03-14 NOTE — ED Provider Notes (Signed)
-----------------------------------------   9:09 AM on 03/14/2017 -----------------------------------------   Blood pressure 109/63, pulse 61, temperature 97.6 F (36.4 C), temperature source Oral, resp. rate 16, height 5\' 9"  (1.753 m), weight 68 kg (150 lb), SpO2 98 %.  The patient had no acute events since last update.  Calm and cooperative at this time.  Disposition is pending Psychiatry/Behavioral Medicine team recommendations.     Nena Polio, MD 03/14/17 [phone]

## 2017-03-14 NOTE — ED Notes (Signed)
ED  Is the patient under IVC or is there intent for IVC: Yes.   Is the patient medically cleared: Yes.   Is there vacancy in the ED BHU: Yes.   Is the population mix appropriate for patient: Yes.   Is the patient awaiting placement in inpatient or outpatient setting:  Group home/assisted living placement  Has the patient had a psychiatric consult: Yes.   Survey of unit performed for contraband, proper placement and condition of furniture, tampering with fixtures in bathroom, shower, and each patient room: Yes.  ; Findings:  APPEARANCE/BEHAVIOR Calm and cooperative NEURO ASSESSMENT Orientation: oriented x4  Denies pain Hallucinations: No.None noted (Hallucinations) denies Speech: Normal Gait: normal RESPIRATORY ASSESSMENT Even  Unlabored respirations  CARDIOVASCULAR ASSESSMENT Pulses equal   regular rate  Skin warm and dry   GASTROINTESTINAL ASSESSMENT no GI complaint EXTREMITIES Full ROM  PLAN OF CARE Provide calm/safe environment. Vital signs assessed twice daily. ED BHU Assessment once each 12-hour shift. Collaborate with TTS daily or as condition indicates. Assure the ED provider has rounded once each shift. Provide and encourage hygiene. Provide redirection as needed. Assess for escalating behavior; address immediately and inform ED provider.  Assess family dynamic and appropriateness for visitation as needed: Yes.  ; If necessary, describe findings:  Educate the patient/family about BHU procedures/visitation: Yes.  ; If necessary, describe findings:

## 2017-03-14 NOTE — ED Notes (Signed)
BEHAVIORAL HEALTH ROUNDING Patient sleeping: Yes.   Patient alert and oriented: not applicable SLEEPING Behavior appropriate: Yes.  ; If no, describe: SLEEPING Nutrition and fluids offered: No SLEEPING Toileting and hygiene offered: NoSLEEPING Sitter present: telesitter, Q 15 min safety rounds and observation. Law enforcement present: Yes ODS

## 2017-03-14 NOTE — ED Notes (Signed)
BEHAVIORAL HEALTH ROUNDING Patient sleeping: No. Patient alert and oriented: yes Behavior appropriate: Yes.  ; If no, describe:  Nutrition and fluids offered: yes Toileting and hygiene offered: Yes  Sitter present: q15 minute observations and security  monitoring Law enforcement present: Yes  ODS  

## 2017-03-14 NOTE — ED Notes (Signed)
He has come out of the Br and he can be heard in the hallway yelling at the officer  - "I just always do what I want - I don't know why you have to say something to me - I just want to do what I want  - I need my cellphone so I can call some people - I have been in here for days  - I want to talk to some folks"  His voice is escalating  - officer is also   Pt ambulated back to his room  - as I entered his room he continued to verbalize anger about wanting to do what he wants  Comfort measure taken including cup of decaf coffee - bed repositioned  - offered to look up some phone numbers for him  - he deescalated  - continue to monitor

## 2017-03-14 NOTE — ED Notes (Signed)
He is standing up at the sink - he has the toilet paper that was wrapped up and he is trying to wash himself with it - a bar of soap is wrapped in the tissue  - I set him up for a bedside bath  - he stood and bathed himself - clothes, undies and socks changed  All item supplied to him I received back including the soap and tissue  Continue to monitor

## 2017-03-14 NOTE — ED Notes (Signed)
He has ambulated to the BR with a steady gait   - he came back to his room with toilet paper wrapped up - he then washed his hand and wiped them on the paper  Pt then wrapped the paper back up and placed it in the bed with him   Continue to monitor

## 2017-03-14 NOTE — ED Notes (Signed)

## 2017-03-14 NOTE — ED Notes (Signed)
BEHAVIORAL HEALTH ROUNDING Patient sleeping: Yes.   Patient alert and oriented: not applicable SLEEPING Behavior appropriate: Yes.  ; If no, describe: SLEEPING Nutrition and fluids offered: No SLEEPING Toileting and hygiene offered: NoSLEEPING Sitter present: telesitter , Q 15 min safety rounds and observation. Law enforcement present: Yes ODS

## 2017-03-14 NOTE — ED Notes (Signed)
ENVIRONMENTAL ASSESSMENT  Potentially harmful objects out of patient reach: Yes.  Personal belongings secured: Yes.  Patient dressed in hospital provided attire only: Yes.  Plastic bags out of patient reach: Yes.  Patient care equipment (cords, cables, call bells, lines, and drains) shortened, removed, or accounted for: Yes.  Equipment and supplies removed from bottom of stretcher: Yes.  Potentially toxic materials out of patient reach: Yes.  Sharps container removed or out of patient reach: Yes.   BEHAVIORAL HEALTH ROUNDING  Patient sleeping: No.  Patient alert and oriented: yes  Behavior appropriate: Yes. ; If no, describe:  Nutrition and fluids offered: Yes  Toileting and hygiene offered: Yes  Sitter present: telesitter, Q 15 min safety rounds and observation.  Law enforcement present: Yes ODS  ED [place]  Is the patient under IVC or is there intent for IVC: Yes.  Is the patient medically cleared: Yes.  Is there vacancy in the ED BHU: Yes.  Is the population mix appropriate for patient: no.  Is the patient awaiting placement in inpatient or outpatient setting: Yes.  Has the patient had a psychiatric consult: Yes.  Survey of unit performed for contraband, proper placement and condition of furniture, tampering with fixtures in bathroom, shower, and each patient room: Yes. ; Findings: All clear  APPEARANCE/BEHAVIOR  calm, cooperative at this time NEURO ASSESSMENT  Orientation: time, place and person  Hallucinations: when asked pt reports he is hearing voices at this time that are saying "Hey what's going on".  Speech: Normal  Gait: normal  RESPIRATORY ASSESSMENT  WNL  CARDIOVASCULAR ASSESSMENT  WNL  GASTROINTESTINAL ASSESSMENT  WNL  EXTREMITIES  WNL  PLAN OF CARE  Provide calm/safe environment. Vital signs assessed TID. ED BHU Assessment once each 12-hour shift. Collaborate with TTS daily or as condition indicates. Assure the ED provider has rounded once  each shift. Provide and encourage hygiene. Provide redirection as needed. Assess for escalating behavior; address immediately and inform ED provider.  Assess family dynamic and appropriateness for visitation as needed: Yes. ; If necessary, describe findings:  Educate the patient/family about BHU procedures/visitation: Yes. ; If necessary, describe findings: Pt is calm and cooperative at this time. Pt understanding and accepting of unit procedures/rules. Will continue to monitor with Q 15 min safety rounds.

## 2017-03-15 MED ORDER — LORAZEPAM 2 MG/ML IJ SOLN
INTRAMUSCULAR | Status: AC
  Administered 2017-03-15: 2 mg via INTRAVENOUS
  Filled 2017-03-15: qty 1

## 2017-03-15 MED ORDER — LORAZEPAM 2 MG/ML IJ SOLN
2.0000 mg | Freq: Once | INTRAMUSCULAR | Status: AC
  Administered 2017-03-15: 2 mg via INTRAVENOUS

## 2017-03-15 MED ORDER — HALOPERIDOL 5 MG PO TABS
ORAL_TABLET | ORAL | Status: AC
  Administered 2017-03-15: 5 mg via ORAL
  Filled 2017-03-15: qty 1

## 2017-03-15 MED ORDER — HALOPERIDOL 5 MG PO TABS
5.0000 mg | ORAL_TABLET | Freq: Once | ORAL | Status: AC
  Administered 2017-03-15: 5 mg via ORAL

## 2017-03-15 MED ORDER — OLANZAPINE 5 MG PO TABS
5.0000 mg | ORAL_TABLET | Freq: Once | ORAL | Status: AC
  Administered 2017-03-15: 5 mg via ORAL
  Filled 2017-03-15: qty 1

## 2017-03-15 MED ORDER — HALOPERIDOL 2 MG PO TABS
2.0000 mg | ORAL_TABLET | Freq: Once | ORAL | Status: DC
Start: 2017-03-15 — End: 2017-03-15
  Filled 2017-03-15: qty 1

## 2017-03-15 NOTE — ED Notes (Signed)
BEHAVIORAL HEALTH ROUNDING  Patient sleeping: No.  Patient alert and oriented: yes  Behavior appropriate: Yes. ; If no, describe:  Nutrition and fluids offered: Yes  Toileting and hygiene offered: Yes  Sitter present: telesitter, Q 15 min safety rounds and observation.  Law enforcement present: Yes ODS

## 2017-03-15 NOTE — ED Notes (Signed)
Jeremy [name] (brother)- [phone]

## 2017-03-15 NOTE — ED Provider Notes (Signed)
-----------------------------------------   3:13 AM on 03/15/2017 -----------------------------------------   Blood pressure (!) 180/119, pulse (!) 114, temperature 98 F (36.7 C), temperature source Oral, resp. rate 20, height 5\' 9"  (1.753 m), weight 68 kg (150 lb), SpO2 97 %.  The patient had no acute events since last update.  Calm and cooperative at this time.  Disposition is pending Psychiatry/Behavioral Medicine team recommendations.     Darel Hong, MD 03/15/17 [phone]

## 2017-03-15 NOTE — ED Notes (Addendum)
Negative PPD. (82mm induration)

## 2017-03-15 NOTE — ED Notes (Signed)
  BEHAVIORAL HEALTH ROUNDING Patient sleeping: Yes.   Patient alert and oriented: not applicable SLEEPING Behavior appropriate: Yes.  ; If no, describe: SLEEPING Nutrition and fluids offered: No SLEEPING Toileting and hygiene offered: NoSLEEPING Sitter present: safety sitter, Q 15 min safety rounds and observation. Law enforcement present: Yes ODS

## 2017-03-15 NOTE — ED Notes (Addendum)
Pt has not had any sleep. Believe pt is seeing things. Keeps talking about a tray in front of him. Nothing in front of pt.  MD notified and haldol order received. Pt made comfortable, covered with blanket. Head of bed adjusted

## 2017-03-15 NOTE — ED Notes (Signed)
IVC pending placement 

## 2017-03-15 NOTE — ED Notes (Signed)
Pt increasingly becoming agitated. MD notified. Orders obtained

## 2017-03-15 NOTE — ED Notes (Signed)
ENVIRONMENTAL ASSESSMENT  Potentially harmful objects out of patient reach: Yes.  Personal belongings secured: Yes.  Patient dressed in hospital provided attire only: Yes.  Plastic bags out of patient reach: Yes.  Patient care equipment (cords, cables, call bells, lines, and drains) shortened, removed, or accounted for: Yes.  Equipment and supplies removed from bottom of stretcher: Yes.  Potentially toxic materials out of patient reach: Yes.  Sharps container removed or out of patient reach: Yes.   BEHAVIORAL HEALTH ROUNDING Patient sleeping: Yes.   Patient alert and oriented: not applicable SLEEPING Behavior appropriate: Yes.  ; If no, describe: SLEEPING Nutrition and fluids offered: No SLEEPING Toileting and hygiene offered: NoSLEEPING Sitter present: safety sitter, Q 15 min safety rounds and observation. Law enforcement present: Yes ODS  ED [place]  Is the patient under IVC or is there intent for IVC: Yes.  Is the patient medically cleared: Yes.  Is there vacancy in the ED BHU: Yes.  Is the population mix appropriate for patient: no.  Is the patient awaiting placement in inpatient or outpatient setting: Yes.  Has the patient had a psychiatric consult: Yes.  Survey of unit performed for contraband, proper placement and condition of furniture, tampering with fixtures in bathroom, shower, and each patient room: Yes. ; Findings: All clear  APPEARANCE/BEHAVIOR  calm, cooperative at this time NEURO ASSESSMENT  Orientation: time, place and person  Hallucinations: appears at times to be responding to external stimuli Speech: Normal  Gait: normal  RESPIRATORY ASSESSMENT  WNL  CARDIOVASCULAR ASSESSMENT  WNL  GASTROINTESTINAL ASSESSMENT  WNL  EXTREMITIES  WNL  PLAN OF CARE  Provide calm/safe environment. Vital signs assessed TID. ED BHU Assessment once each 12-hour shift. Collaborate with TTS daily or as condition indicates. Assure the ED provider has rounded  once each shift. Provide and encourage hygiene. Provide redirection as needed. Assess for escalating behavior; address immediately and inform ED provider.  Assess family dynamic and appropriateness for visitation as needed: Yes. ; If necessary, describe findings:  Educate the patient/family about BHU procedures/visitation: Yes. ; If necessary, describe findings: Pt is calm and cooperative at this time.Will continue to monitor with Q 15 min safety rounds and observation.

## 2017-03-15 NOTE — ED Notes (Signed)
Patient did not want to take a shower at this time. Patient said that he wanted to wait a little while.

## 2017-03-15 NOTE — Progress Notes (Signed)
Type of Service: Clinical Social Work  Per notes patient had a site visit by [facility] Friday 03/13/17.  PPD completed Friday and per RN is scheduled to be read today at 16:40.   LCSW called [facility] to confirm an official bed offer for patient.  Left message to return call.  Plan: Weekday LCSW will F/U with [facility] or Guardianship supervisor Barrie Lyme on Monday.  Casimer Lanius, LCSW Licensed Clinical Social Worker [phone] 3:28 PM

## 2017-03-15 NOTE — ED Notes (Signed)
Sitter at bedside, sitter taking pt for wheelchair ride around department

## 2017-03-15 NOTE — ED Notes (Signed)
Pt given tray

## 2017-03-15 NOTE — ED Notes (Signed)
Pt resting comfortably at this time.

## 2017-03-16 MED ORDER — HALOPERIDOL 0.5 MG PO TABS
0.5000 mg | ORAL_TABLET | Freq: Two times a day (BID) | ORAL | Status: DC
  Administered 2017-03-16 – 2017-03-17 (×3): 0.5 mg via ORAL
  Filled 2017-03-16 (×4): qty 1

## 2017-03-16 NOTE — ED Notes (Signed)
Quiet, watching TV. No apparent distress.

## 2017-03-16 NOTE — ED Notes (Signed)
BEHAVIORAL HEALTH ROUNDING Patient sleeping: No. Patient alert and oriented: yes Behavior appropriate: Yes.  ; If no, describe:  Nutrition and fluids offered: Yes  Toileting and hygiene offered: Yes  Sitter present: not applicable Law enforcement present: Yes  

## 2017-03-16 NOTE — ED Notes (Signed)
BEHAVIORAL HEALTH ROUNDING Patient sleeping: Yes.   Patient alert and oriented: not applicable SLEEPING Behavior appropriate: Yes.  ; If no, describe: SLEEPING Nutrition and fluids offered: No SLEEPING Toileting and hygiene offered: NoSLEEPING Sitter present: telesitter, Q 15 min safety rounds and observation. Law enforcement present: Yes ODS

## 2017-03-16 NOTE — ED Notes (Signed)
Patient resting with HOB elevated, resp unlabored, eyes closed.

## 2017-03-16 NOTE — ED Notes (Signed)
IVC  PENDING  PLACEMENT  / IVC PAPERS  NEED TO  BE  RENEWED  ON 03/18/17

## 2017-03-16 NOTE — ED Notes (Signed)
Patient awake eating breakfast.

## 2017-03-16 NOTE — Clinical Social Work Note (Signed)
CSW spoke with [facility] Administrator Madelynn Done at [phone] who confirmed patients acceptance to the [facility]. CSW updated FL-2 and faxed to [phone]. [name] to pick up pt on Tuesday 03/17/17 at noon. CSW updated EDP and Dr. Weber Cooks. CSW continuing to follow for transitions of care/discharge needs.   Oretha Ellis, Latanya Presser, [place] Social Worker-ED [phone]

## 2017-03-16 NOTE — ED Notes (Signed)

## 2017-03-16 NOTE — ED Notes (Signed)
Resting in bed, taking juice. Has taken meal by self after set up.

## 2017-03-16 NOTE — ED Notes (Signed)
BEHAVIORAL HEALTH ROUNDING Patient sleeping: Yes.   Patient alert and oriented: not applicable Behavior appropriate: Yes.  ; If no, describe:  Nutrition and fluids offered: No Toileting and hygiene offered: No Sitter present: not applicable Law enforcement present: Yes  

## 2017-03-16 NOTE — ED Notes (Signed)
Awakened and set up for breakfast. Able to feed self. Minimal conversation. Denies discomfort.

## 2017-03-16 NOTE — NC FL2 (Signed)
  [place] LEVEL OF CARE SCREENING TOOL     IDENTIFICATION  Patient Name: Jeremy [name] Birthdate: 1946-01-19 Sex: male Admission Date (Current Location): 03/11/2017  [facility] and [place] Number:  Engineering geologist and Address:  [facility], [address] [place], [place] [address]      Provider Number: [id number]  Attending Physician Name and Address:  No att. providers found  Relative Name and Phone Number:  Trei Schoch [phone]    Current Level of Care: Hospital Recommended Level of Care: [facility] Prior Approval Number:    Date Approved/Denied:   PASRR Number: [id number]  Discharge Plan: Domiciliary (Rest home)    Current Diagnoses: Patient Active Problem List   Diagnosis Date Noted  . 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 Dementia with behavioral disturbance 11/02/2015  . Hypertension 11/02/2015  . Seizures ([place]) 11/02/2015  . Incarcerated hernia     Orientation RESPIRATION BLADDER Height & Weight     Self  Normal Continent Weight: 150 lb (68 kg) Height:  5\' 9"  (175.3 cm)  BEHAVIORAL SYMPTOMS/MOOD NEUROLOGICAL BOWEL NUTRITION STATUS      Continent Diet(Normal Diet)  AMBULATORY STATUS COMMUNICATION OF NEEDS Skin   Independent Verbally Normal                       Personal Care Assistance Level of Assistance  Bathing, Feeding, Dressing Bathing Assistance: Independent Feeding assistance: Independent Dressing Assistance: Independent     Functional Limitations Info  Sight, Hearing, Speech Sight Info: Adequate Hearing Info: Adequate Speech Info: Adequate    SPECIAL CARE FACTORS FREQUENCY                       Contractures Contractures Info: Not present    Additional Factors Info  Code Status, Allergies Code Status Info: Full Allergies Info: No Known Allergies           Current Medications (03/16/2017):  This is the current hospital active medication  list Current Facility-Administered Medications  Medication Dose Route Frequency Provider Last Rate Last Dose  . haloperidol (HALDOL) tablet 0.5 mg  0.5 mg Oral BID Earleen Newport, MD      . losartan (COZAAR) tablet 100 mg  100 mg Oral Daily Paulette Blanch, MD   100 mg at 03/16/17 4665   And  . hydrochlorothiazide (MICROZIDE) capsule 12.5 mg  12.5 mg Oral Daily Paulette Blanch, MD   12.5 mg at 03/16/17 0824  . levETIRAcetam (KEPPRA) tablet 500 mg  500 mg Oral BID Paulette Blanch, MD   500 mg at 03/16/17 0825   Current Outpatient Medications  Medication Sig Dispense Refill  . levETIRAcetam (KEPPRA) 500 MG tablet Take 500 mg by mouth 2 (two) times daily. Take 500 mg every morning and take 750 mg every evening.    Marland Kitchen losartan-hydrochlorothiazide (HYZAAR) 100-12.5 MG tablet Take 1 tablet by mouth daily.       Discharge Medications: Please see discharge summary for a list of discharge medications.  Relevant Imaging Results:  Relevant Lab Results:   Additional Information SSN: [id number]  Truitt Merle, LCSW

## 2017-03-16 NOTE — ED Notes (Signed)
Continues to alternately rest with eyes closed or visit with staff when staff visits room. No aggressive behavior noted.

## 2017-03-16 NOTE — ED Notes (Signed)
Sitting up in bed, visits with staff with limited conversation, calm.

## 2017-03-16 NOTE — ED Provider Notes (Signed)
-----------------------------------------   6:32 AM on 03/16/2017 -----------------------------------------   Blood pressure 110/74, pulse 91, temperature 97.9 F (36.6 C), temperature source Oral, resp. rate 16, height 5\' 9"  (1.753 m), weight 68 kg (150 lb), SpO2 97 %.  The patient had no acute events since last update.  Calm and cooperative at this time.  Disposition is pending social work recommendations.     Loney Hering, MD 03/16/17 [phone]

## 2017-03-16 NOTE — ED Notes (Signed)
Lunch tray placed at bedside. Pt sleeping. 

## 2017-03-16 NOTE — NC FL2 (Signed)
  [place] LEVEL OF CARE SCREENING TOOL     IDENTIFICATION  Patient Name: Jeremy [name] Birthdate: May 22, 1945 Sex: male Admission Date (Current Location): 03/11/2017  [facility] and [place] Number:  Engineering geologist and Address:  [facility], [address] [place], [place] [address]      Provider Number: [id number]  Attending Physician Name and Address:  No att. providers found  Relative Name and Phone Number:  Teofilo Lupinacci [phone]    Current Level of Care: Hospital Recommended Level of Care: [facility] Prior Approval Number:    Date Approved/Denied:   PASRR Number: [id number]  Discharge Plan: Domiciliary (Rest home)    Current Diagnoses: Patient Active Problem List   Diagnosis Date Noted  . 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 Dementia with behavioral disturbance 11/02/2015  . Hypertension 11/02/2015  . Seizures ([place]) 11/02/2015  . Incarcerated hernia     Orientation RESPIRATION BLADDER Height & Weight     Self  Normal Continent Weight: 150 lb (68 kg) Height:  5\' 9"  (175.3 cm)  BEHAVIORAL SYMPTOMS/MOOD NEUROLOGICAL BOWEL NUTRITION STATUS      Continent Diet(Normal Diet)  AMBULATORY STATUS COMMUNICATION OF NEEDS Skin   Independent Verbally Normal                       Personal Care Assistance Level of Assistance  Bathing, Feeding, Dressing Bathing Assistance: Independent Feeding assistance: Independent Dressing Assistance: Independent     Functional Limitations Info  Sight, Hearing, Speech Sight Info: Adequate Hearing Info: Adequate Speech Info: Adequate    SPECIAL CARE FACTORS FREQUENCY                       Contractures Contractures Info: Not present    Additional Factors Info  Code Status, Allergies Code Status Info: Full Allergies Info: No Known Allergies           Current Medications (03/16/2017):  This is the current hospital active medication  list Current Facility-Administered Medications  Medication Dose Route Frequency Provider Last Rate Last Dose  . haloperidol (HALDOL) tablet 0.5 mg  0.5 mg Oral BID Earleen Newport, MD      . losartan (COZAAR) tablet 100 mg  100 mg Oral Daily Paulette Blanch, MD   100 mg at 03/16/17 9179   And  . hydrochlorothiazide (MICROZIDE) capsule 12.5 mg  12.5 mg Oral Daily Paulette Blanch, MD   12.5 mg at 03/16/17 0824  . levETIRAcetam (KEPPRA) tablet 500 mg  500 mg Oral BID Paulette Blanch, MD   500 mg at 03/16/17 0825   Current Outpatient Medications  Medication Sig Dispense Refill  . levETIRAcetam (KEPPRA) 500 MG tablet Take 500 mg by mouth 2 (two) times daily. Take 500 mg every morning and take 750 mg every evening.    Marland Kitchen losartan-hydrochlorothiazide (HYZAAR) 100-12.5 MG tablet Take 1 tablet by mouth daily.       Discharge Medications: Please see discharge summary for a list of discharge medications.  Relevant Imaging Results:  Relevant Lab Results:   Additional Information SSN: [id number]  Truitt Merle, LCSW

## 2017-03-16 NOTE — Consult Note (Signed)
Psychiatry: Brief follow-up.  Patient has been accepted to an appropriate living facility.  Thank you very much to social work.  Patient's FL 2 has been signed and he should be ready to go as soon as transportation is available in the bed is prepared.

## 2017-03-17 MED ORDER — LEVETIRACETAM 250 MG PO TABS: 250.0000 mg | ORAL_TABLET | Freq: Two times a day (BID) | ORAL | 0 refills | Status: DC

## 2017-03-17 MED ORDER — LOSARTAN POTASSIUM-HCTZ 100-25 MG PO TABS: 1.0000 | ORAL_TABLET | Freq: Every day | ORAL | 0 refills | Status: DC

## 2017-03-17 MED ORDER — HALOPERIDOL 0.5 MG PO TABS: 0.5000 mg | ORAL_TABLET | Freq: Two times a day (BID) | ORAL | 0 refills | Status: DC

## 2017-03-17 NOTE — ED Notes (Signed)
Pt took a shower. Two chairs placed in bathroom for pt. One for sitting and the other for toiletries to be close by. Pt bathed himself. Pt escorted back to room with towels covering pt and placed on side of bed. This tech helped pt dry and put on clean clothes. Tech placed diaper and underwear on pt. Pt linens changed and pt given clean blanket. Trash removed from pt room. Tele sitter called and notified that pt was back in the room. Pt is lying in bed with bed in lowest position resting at this time.

## 2017-03-17 NOTE — ED Notes (Signed)
This tech retrieved three bags of belongings and opened bags on bed in front of the pt. This tech helped pt get dressed. Pt ask about his wallet and I informed him that his brother had taken that stuff when he checked in. Pt placed in wheelchair.

## 2017-03-17 NOTE — ED Provider Notes (Signed)
-----------------------------------------   7:18 AM on 03/17/2017 -----------------------------------------   Blood pressure 102/73, pulse 83, temperature 98.2 F (36.8 C), temperature source Oral, resp. rate 16, height 5\' 9"  (1.753 m), weight 68 kg (150 lb), SpO2 97 %.  The patient had no acute events since last update.  Calm and cooperative at this time.  Disposition is pending Psychiatry/Behavioral Medicine team recommendations.     Orbie Pyo, MD 03/17/17 [phone]

## 2017-03-17 NOTE — ED Notes (Signed)
Pt given breakfast tray. Juice opened for pt. Pt wanted to know time and was able to tell this tech what day it is. Pt is eating breakfast.

## 2017-03-17 NOTE — ED Notes (Signed)
Pt D/C into the care of Melida Quitter by this RN and Gilmer Mor, [place]. Hard copy script of Haldol given to Madelynn Done, other 2 prescriptions faced by Kathlee Nations, RN to [place] on 03/17/2017. Madelynn Done given a copy of PPD read as well. Pt visualized in NAD at this time. Melida Quitter signed for patient discharge, denies any comments/concerns regarding D/C at this time. Pt taken to lobby via wheelchair by [employer].

## 2017-03-17 NOTE — ED Notes (Signed)
Pt offered breakfast tray by Pam, EDT. Pt sitting up in bed eating at this time. Tele-sitter at bedside at this time.

## 2017-03-17 NOTE — ED Notes (Signed)
Pt sitting up in bed eating lunch at this time. No change in patient condition. Will continue to monitor for further patient needs.

## 2017-03-17 NOTE — Clinical Social Work Note (Signed)
CSW spoke with [facility] Administrator Madelynn Done at [phone] who confirmed FL-2 was received and he will be here at noon to pick up pt. CSW left voicemail for [place] at [place]. CSW also spoke with pt's brother Karel Turpen [phone]) and provided [name]'s phone number and address of [facility]. Brother was pleased. CSW updated RN Jinny Blossom. CSW signing off as no further Social Work needs identified.   Oretha Ellis, Latanya Presser, [place] Social Worker-ED [phone]

## 2017-03-17 NOTE — ED Provider Notes (Signed)
-----------------------------------------   11:36 AM on 03/17/2017 -----------------------------------------   Blood pressure 107/73, pulse (!) 104, temperature 98.2 F (36.8 C), temperature source Oral, resp. rate 16, height 5\' 9"  (1.753 m), weight 68 kg (150 lb), SpO2 99 %.  The patient had no acute events since last update.  Calm and cooperative at this time.  He is to be picked up by group home today at noon.    Orbie Pyo, MD 03/17/17 [phone]

## 2017-03-30 ENCOUNTER — Emergency Department
Admission: EM | Admit: 2017-03-30 | Discharge: 2017-03-31 | Disposition: A | Discharge status: Home / Self Care | Payer: Medicare HMO | Attending: Emergency Medicine | Admitting: Emergency Medicine

## 2017-03-30 ENCOUNTER — Other Ambulatory Visit: Payer: Self-pay

## 2017-03-30 ENCOUNTER — Encounter: Payer: Self-pay | Admitting: Emergency Medicine

## 2017-03-30 DIAGNOSIS — Z79899 Other long term (current) drug therapy: Secondary | ICD-10-CM | POA: Insufficient documentation

## 2017-03-30 DIAGNOSIS — F0391 Unspecified dementia with behavioral disturbance: Secondary | ICD-10-CM | POA: Insufficient documentation

## 2017-03-30 DIAGNOSIS — F99 Mental disorder, not otherwise specified: Secondary | ICD-10-CM | POA: Diagnosis present

## 2017-03-30 DIAGNOSIS — I10 Essential (primary) hypertension: Secondary | ICD-10-CM | POA: Insufficient documentation

## 2017-03-30 DIAGNOSIS — Z87891 Personal history of nicotine dependence: Secondary | ICD-10-CM | POA: Diagnosis not present

## 2017-03-30 LAB — COMPREHENSIVE METABOLIC PANEL
ALK PHOS: 76 U/L (ref 38–126)
ALT: 18 U/L (ref 17–63)
ANION GAP: 11 (ref 5–15)
AST: 27 U/L (ref 15–41)
Albumin: 4.2 g/dL (ref 3.5–5.0)
BUN: 14 mg/dL (ref 6–20)
CHLORIDE: 100 mmol/L — AB (ref 101–111)
CO2: 23 mmol/L (ref 22–32)
Calcium: 9.1 mg/dL (ref 8.9–10.3)
Creatinine, Ser: 1.28 mg/dL — ABNORMAL HIGH (ref 0.61–1.24)
GFR calc non Af Amer: 55 mL/min — ABNORMAL LOW (ref >60)
Glucose, Bld: 100 mg/dL — ABNORMAL HIGH (ref 65–99)
POTASSIUM: 3.6 mmol/L (ref 3.5–5.1)
SODIUM: 134 mmol/L — AB (ref 135–145)
Total Bilirubin: 0.6 mg/dL (ref 0.3–1.2)
Total Protein: 8 g/dL (ref 6.5–8.1)

## 2017-03-30 LAB — ETHANOL: Alcohol, Ethyl (B): <10 mg/dL (ref <10)

## 2017-03-30 LAB — CBC
HCT: 33.8 % — ABNORMAL LOW (ref 40.0–52.0)
HEMOGLOBIN: 11.5 g/dL — AB (ref 13.0–18.0)
MCH: 29.7 pg (ref 26.0–34.0)
MCHC: 34.1 g/dL (ref 32.0–36.0)
MCV: 87.2 fL (ref 80.0–100.0)
Platelets: 256 10*3/uL (ref 150–440)
RBC: 3.87 MIL/uL — ABNORMAL LOW (ref 4.40–5.90)
RDW: 14.8 % — ABNORMAL HIGH (ref 11.5–14.5)
WBC: 9.5 10*3/uL (ref 3.8–10.6)

## 2017-03-30 LAB — ACETAMINOPHEN LEVEL

## 2017-03-30 LAB — SALICYLATE LEVEL

## 2017-03-30 NOTE — ED Triage Notes (Signed)
Patient ambulatory to triage with steady gait, without difficulty or distress noted, in custody of [place] [employer] for IVC; pt st he was brought because he left the care home because "he didn't like staying there"; pt denies SI or HI

## 2017-03-30 NOTE — ED Notes (Signed)
Patient assigned to appropriate care area. Patient oriented to unit/care area: Informed that, for their safety, care areas are designed for safety and monitored by security cameras at all times; and visiting hours explained to patient. Patient verbalizes understanding, and verbal contract for safety obtained. 

## 2017-03-31 NOTE — ED Notes (Signed)
Nilda Calamity - caregiver [phone] informed him that pt is ready for discharge  - he stated he would try to be here in the next few hours ???   Social work spoke with him a few hours ago -  We must continue to await his arrival for safe transport

## 2017-03-31 NOTE — ED Notes (Signed)
[facility] - caregiver from his group home has arrived to transport him  - pt given his three bags of belongings and he took one bag to the BR so that he may get dressed   Received word from the first nurse that Ellwood Sayers reports  "If he don't hurry up or if he is not ready then I am leaving and I will not come back til 7 or 8 tonight"  Pt relations brought [place] to pts bedside so that he will not abandon the pt here    Instructions reviewed with the caregiver and the pt

## 2017-03-31 NOTE — ED Notes (Signed)
Pt observed lying in bed   Pt visualized with NAD  No verbalized needs or concerns at this time  Continue to monitor 

## 2017-03-31 NOTE — ED Notes (Signed)

## 2017-03-31 NOTE — ED Provider Notes (Signed)
[facility] Emergency Department Provider Note    First MD Initiated Contact with Patient 03/30/17 2322     (approximate)  I have reviewed the triage vital signs and the nursing notes.   HISTORY  Chief Complaint Mental Health Problem   HPI GRAYSEN WOODYARD is a 72 y.o. male with below list of chronic medical conditions including dementia presents to the emergency department involuntarily committed secondary to "leaving the facility that he resides and unwilling to return there".  Patient states that he does not want to return to the place where he was living and he was found apparently wandering the streets by the police who brought him to the emergency department.  Patient denies any suicidal or homicidal ideation.   Past Medical History:  Diagnosis Date  . Dementia   . Hypertension   . Seizures [facility])     Patient Active Problem List   Diagnosis Date Noted  . 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 Dementia with behavioral disturbance 11/02/2015  . Hypertension 11/02/2015  . Seizures ([place]) 11/02/2015  . Incarcerated hernia     Past Surgical History:  Procedure Laterality Date  . none      Prior to Admission medications   Medication Sig Start Date End Date Taking? Authorizing Provider  haloperidol (HALDOL) 0.5 MG tablet Take 1 tablet (0.5 mg total) by mouth 2 (two) times daily. 03/17/17 03/17/18  Orbie Pyo, MD  levETIRAcetam (KEPPRA) 250 MG tablet Take 1 tablet (250 mg total) by mouth 2 (two) times daily. Take 2 tabs (500mg ) every morning and 3 tabs (750mg ) every evening 03/17/17   Schaevitz, Randall An, MD  losartan-hydrochlorothiazide (HYZAAR) 100-25 MG tablet Take 1 tablet by mouth daily. 03/17/17 03/17/18  Schaevitz, Randall An, MD    Allergies Patient has no known allergies.  No family history on file.  Social History Social History   Tobacco Use  . Smoking status: Former Smoker    Years: 25.00  . Smokeless  tobacco: Never Used  Substance Use Topics  . Alcohol use: No    Comment: quit etoh x 3 months ago  . Drug use: Yes    Types: Cocaine, IV    Comment: quit 3 months ago - x 17 year drug and alcohol use    Review of Systems Constitutional: No fever/chills Eyes: No visual changes. ENT: No sore throat. Cardiovascular: Denies chest pain. Respiratory: Denies shortness of breath. Gastrointestinal: No abdominal pain.  No nausea, no vomiting.  No diarrhea.  No constipation. Genitourinary: Negative for dysuria. Musculoskeletal: Negative for neck pain.  Negative for back pain. Integumentary: Negative for rash. Neurological: Negative for headaches, focal weakness or numbness. Psychiatric:Positive for dementia  ____________________________________________   PHYSICAL EXAM:  VITAL SIGNS: ED Triage Vitals  Enc Vitals Group     BP 03/30/17 2243 (!) 155/87     Pulse Rate 03/31/17 0643 73     Resp 03/31/17 0643 16     Temp 03/30/17 2243 97.9 F (36.6 C)     Temp Source 03/30/17 2243 Oral     SpO2 03/30/17 2243 98 %     Weight 03/30/17 2248 68 kg (150 lb)     Height 03/30/17 2248 1.753 m (5\' 9" )     Head Circumference --      Peak Flow --      Pain Score --      Pain Loc --      Pain Edu? --      Excl. in [place]? --  Constitutional: Alert and  Well appearing and in no acute distress. Eyes: Conjunctivae are normal.  Head: Atraumatic. Mouth/Throat: Mucous membranes are moist.  Oropharynx non-erythematous. Neck: No stridor.   Cardiovascular: Normal rate, regular rhythm. Good peripheral circulation. Grossly normal heart sounds. Respiratory: Normal respiratory effort.  No retractions. Lungs CTAB. Gastrointestinal: Soft and nontender. No distention.  Musculoskeletal: No lower extremity tenderness nor edema. No gross deformities of extremities. Neurologic:  Normal speech and language. No gross focal neurologic deficits are appreciated.  Skin:  Skin is warm, dry and intact. No rash  noted. Psychiatric: Mood and affect are normal. Speech and behavior are normal.  ____________________________________________   LABS (all labs ordered are listed, but only abnormal results are displayed)  Labs Reviewed  COMPREHENSIVE METABOLIC PANEL - Abnormal; Notable for the following components:      Result Value   Sodium 134 (*)    Chloride 100 (*)    Glucose, Bld 100 (*)    Creatinine, Ser 1.28 (*)    GFR calc non Af Amer 55 (*)    All other components within normal limits  ACETAMINOPHEN LEVEL - Abnormal; Notable for the following components:   Acetaminophen (Tylenol), Serum <10 (*)    All other components within normal limits  CBC - Abnormal; Notable for the following components:   RBC 3.87 (*)    Hemoglobin 11.5 (*)    HCT 33.8 (*)    RDW 14.8 (*)    All other components within normal limits  ETHANOL  SALICYLATE LEVEL  URINE DRUG SCREEN, QUALITATIVE ([facility] ONLY)   ______________________________________     Procedures   ____________________________________________   INITIAL IMPRESSION / ASSESSMENT AND PLAN / ED COURSE  As part of my medical decision making, I reviewed the following data within the electronic MEDICAL RECORD [id number] year old male presenting involuntarily committed secondary to dementia with behavioral disturbances.  Patient was found wandering the street and is unwilling to return to the place where he resides.  Social work consult pending at this time ____________________________________________  FINAL CLINICAL IMPRESSION(S) / ED DIAGNOSES  Final diagnoses:  Dementia with behavioral disturbance, unspecified dementia type     MEDICATIONS GIVEN DURING THIS VISIT:  Medications - No data to display   ED Discharge Orders    None       Note:  This document was prepared using Dragon voice recognition software and may include unintentional dictation errors.    Gregor Hams, MD 03/31/17 [phone]

## 2017-03-31 NOTE — ED Notes (Signed)
BEHAVIORAL HEALTH ROUNDING Patient sleeping: Yes.   Patient alert and oriented: eyes closed  Appears to be asleep Behavior appropriate: Yes.  ; If no, describe:  Nutrition and fluids offered: Yes  Toileting and hygiene offered: sleeping Sitter present: q 15 minute observations and security monitoring Law enforcement present: yes  ODS 

## 2017-03-31 NOTE — ED Notes (Signed)
BEHAVIORAL HEALTH ROUNDING Patient sleeping: No. Patient alert and oriented: yes Behavior appropriate: Yes.  ; If no, describe:  Nutrition and fluids offered: yes Toileting and hygiene offered: Yes  Sitter present: q15 minute observations and security  monitoring Law enforcement present: Yes  ODS  

## 2017-03-31 NOTE — Clinical Social Work Note (Addendum)
CSW spoke with patient who was in the hallway in a hospital bed. Patient appears to have been IVC'd. Patient was very calm and cooperative with CSW and was pleasant to speak with. 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 Patient stated he got mad at a resident who was trying to ask how much money he had left after his check paid for the group home expenses. Patient took some of his belongings and left the group home. Police reportedly found him wandering in the street and picked him up and brought him to ED. CSW spoke with him about his feelings about the group home environment as a whole. Patient states he is willing to return to his group home today. CSW informed the physician in the ED. Also, if patient is under IVC, it will need to be cleared by psych and rescinded before patient discharges. CSW called the group home owner: Madelynn Done: [phone] and informed him of the above. At this time, Madelynn Done is willing to accept patient back. CSW notified patient's brother, Teon Hudnall, of patient's return to group home. Patient's brother noted concerns regarding if patient continues to walk away from group home. CSW informed him that it might be beneficial to start looking into memory care units for patient in the near future.Patient's nurse, Amy T., has been made aware.  Shela Leff MSW,LcSW [phone]

## 2017-03-31 NOTE — ED Notes (Signed)
IVC rescinded - pt to transfer back to his group home  - awaiting transportation to arrive

## 2017-03-31 NOTE — ED Provider Notes (Signed)
SW has d/w care home, they are comfortable taking him back. No indication for acute tx in ED at this time. Reversed IVC.   Lavonia Drafts, MD 03/31/17 1009

## 2017-03-31 NOTE — ED Notes (Signed)
ED  Is the patient under IVC or is there intent for IVC:  rescinded  Is the patient medically cleared: Yes.   Is there vacancy in the ED BHU: Yes.   Is the population mix appropriate for patient: Yes.   Is the patient awaiting placement in inpatient or outpatient setting:  Pt to be discharged  Has the patient had a psychiatric consult: Yes.   Survey of unit performed for contraband, proper placement and condition of furniture, tampering with fixtures in bathroom, shower, and each patient room: Yes.  ; Findings:  APPEARANCE/BEHAVIOR Calm and cooperative NEURO ASSESSMENT Orientation: oriented x3  Denies pain Hallucinations: No.None noted (Hallucinations) Speech: Normal Gait: normal RESPIRATORY ASSESSMENT Even  Unlabored respirations  CARDIOVASCULAR ASSESSMENT Pulses equal   regular rate  Skin warm and dry   GASTROINTESTINAL ASSESSMENT no GI complaint EXTREMITIES Full ROM  PLAN OF CARE Provide calm/safe environment. Vital signs assessed twice daily. ED BHU Assessment once each 12-hour shift. Collaborate with TTS daily or as condition indicates. Assure the ED provider has rounded once each shift. Provide and encourage hygiene. Provide redirection as needed. Assess for escalating behavior; address immediately and inform ED provider.  Assess family dynamic and appropriateness for visitation as needed: Yes.  ; If necessary, describe findings:  Educate the patient/family about BHU procedures/visitation: Yes.  ; If necessary, describe findings:

## 2017-04-15 DIAGNOSIS — R569 Unspecified convulsions: Secondary | ICD-10-CM | POA: Diagnosis not present

## 2017-04-15 DIAGNOSIS — F1490 Cocaine use, unspecified, uncomplicated: Secondary | ICD-10-CM | POA: Diagnosis not present

## 2017-04-15 DIAGNOSIS — I10 Essential (primary) hypertension: Secondary | ICD-10-CM | POA: Diagnosis not present

## 2017-04-15 DIAGNOSIS — Z79899 Other long term (current) drug therapy: Secondary | ICD-10-CM | POA: Diagnosis not present

## 2017-06-25 ENCOUNTER — Emergency Department
Admission: EM | Admit: 2017-06-25 | Discharge: 2017-06-26 | Disposition: A | Discharge status: Home / Self Care | Payer: Medicare HMO | Attending: Emergency Medicine | Admitting: Emergency Medicine

## 2017-06-25 ENCOUNTER — Emergency Department: Payer: Medicare HMO

## 2017-06-25 ENCOUNTER — Other Ambulatory Visit: Payer: Self-pay

## 2017-06-25 DIAGNOSIS — F0390 Unspecified dementia without behavioral disturbance: Secondary | ICD-10-CM | POA: Diagnosis not present

## 2017-06-25 DIAGNOSIS — R569 Unspecified convulsions: Secondary | ICD-10-CM | POA: Diagnosis not present

## 2017-06-25 DIAGNOSIS — R4182 Altered mental status, unspecified: Secondary | ICD-10-CM | POA: Diagnosis not present

## 2017-06-25 DIAGNOSIS — R55 Syncope and collapse: Secondary | ICD-10-CM | POA: Diagnosis not present

## 2017-06-25 DIAGNOSIS — G40909 Epilepsy, unspecified, not intractable, without status epilepticus: Secondary | ICD-10-CM | POA: Diagnosis not present

## 2017-06-25 DIAGNOSIS — Z79899 Other long term (current) drug therapy: Secondary | ICD-10-CM | POA: Insufficient documentation

## 2017-06-25 DIAGNOSIS — Z87891 Personal history of nicotine dependence: Secondary | ICD-10-CM | POA: Insufficient documentation

## 2017-06-25 DIAGNOSIS — I10 Essential (primary) hypertension: Secondary | ICD-10-CM | POA: Insufficient documentation

## 2017-06-25 DIAGNOSIS — R4020 Unspecified coma: Secondary | ICD-10-CM | POA: Diagnosis not present

## 2017-06-25 NOTE — ED Triage Notes (Signed)
Pt arrived via EMS from [place] d/y roommate reporting that pt was not responding with his eyes rolled back. Pt has hx of seizures and dementia. EMS reports that the fire department stated pt was "out of it" upon arrival. EMS reports pt is more coherent than at the scene and speaking. Pt does not recall any LOC at this time.

## 2017-06-26 LAB — CBC WITH DIFFERENTIAL/PLATELET
BASOS ABS: 0 10*3/uL (ref 0–0.1)
Basophils Relative: 1 %
EOS PCT: 1 %
Eosinophils Absolute: 0.1 10*3/uL (ref 0–0.7)
HEMATOCRIT: 32 % — AB (ref 40.0–52.0)
Hemoglobin: 11 g/dL — ABNORMAL LOW (ref 13.0–18.0)
LYMPHS ABS: 1 10*3/uL (ref 1.0–3.6)
LYMPHS PCT: 12 %
MCH: 29.6 pg (ref 26.0–34.0)
MCHC: 34.3 g/dL (ref 32.0–36.0)
MCV: 86.3 fL (ref 80.0–100.0)
MONO ABS: 0.5 10*3/uL (ref 0.2–1.0)
MONOS PCT: 6 %
NEUTROS ABS: 7.1 10*3/uL — AB (ref 1.4–6.5)
Neutrophils Relative %: 80 %
Platelets: 261 10*3/uL (ref 150–440)
RBC: 3.7 MIL/uL — ABNORMAL LOW (ref 4.40–5.90)
RDW: 15.9 % — AB (ref 11.5–14.5)
WBC: 8.7 10*3/uL (ref 3.8–10.6)

## 2017-06-26 LAB — URINALYSIS, COMPLETE (UACMP) WITH MICROSCOPIC
BILIRUBIN URINE: NEGATIVE
Bacteria, UA: NONE SEEN
Glucose, UA: NEGATIVE mg/dL
Hgb urine dipstick: NEGATIVE
KETONES UR: NEGATIVE mg/dL
LEUKOCYTES UA: NEGATIVE
Nitrite: NEGATIVE
PROTEIN: NEGATIVE mg/dL
SQUAMOUS EPITHELIAL / LPF: NONE SEEN (ref 0–5)
Specific Gravity, Urine: 1.011 (ref 1.005–1.030)
WBC UA: NONE SEEN WBC/hpf (ref 0–5)
pH: 5 (ref 5.0–8.0)

## 2017-06-26 LAB — COMPREHENSIVE METABOLIC PANEL
ALBUMIN: 3.9 g/dL (ref 3.5–5.0)
ALT: 13 U/L — ABNORMAL LOW (ref 17–63)
AST: 29 U/L (ref 15–41)
Alkaline Phosphatase: 58 U/L (ref 38–126)
Anion gap: 13 (ref 5–15)
BUN: 17 mg/dL (ref 6–20)
CHLORIDE: 101 mmol/L (ref 101–111)
CO2: 21 mmol/L — ABNORMAL LOW (ref 22–32)
Calcium: 8.8 mg/dL — ABNORMAL LOW (ref 8.9–10.3)
Creatinine, Ser: 1.38 mg/dL — ABNORMAL HIGH (ref 0.61–1.24)
GFR calc Af Amer: 58 mL/min — ABNORMAL LOW (ref >60)
GFR calc non Af Amer: 50 mL/min — ABNORMAL LOW (ref >60)
GLUCOSE: 106 mg/dL — AB (ref 65–99)
POTASSIUM: 3.7 mmol/L (ref 3.5–5.1)
SODIUM: 135 mmol/L (ref 135–145)
Total Bilirubin: 0.4 mg/dL (ref 0.3–1.2)
Total Protein: 7.5 g/dL (ref 6.5–8.1)

## 2017-06-26 LAB — URINE DRUG SCREEN, QUALITATIVE (ARMC ONLY)
AMPHETAMINES, UR SCREEN: NOT DETECTED
Barbiturates, Ur Screen: NOT DETECTED
Benzodiazepine, Ur Scrn: NOT DETECTED
CANNABINOID 50 NG, UR ~~LOC~~: NOT DETECTED
COCAINE METABOLITE, UR ~~LOC~~: NOT DETECTED
MDMA (Ecstasy)Ur Screen: NOT DETECTED
Methadone Scn, Ur: NOT DETECTED
OPIATE, UR SCREEN: NOT DETECTED
Phencyclidine (PCP) Ur S: NOT DETECTED
Tricyclic, Ur Screen: NOT DETECTED

## 2017-06-26 LAB — TROPONIN I
Troponin I: <0.03 ng/mL (ref <0.03)
Troponin I: <0.03 ng/mL (ref <0.03)

## 2017-06-26 NOTE — ED Provider Notes (Signed)
[facility] Emergency Department Provider Note   ____________________________________________   First MD Initiated Contact with Patient 06/25/17 2327     (approximate)  I have reviewed the triage vital signs and the nursing notes.   HISTORY  Chief Complaint Seizures    HPI Jeremy [name] is a 72 y.o. male . Comes in by EMS. EMS was told by his roommate that the patient's eyes rolled back in his head and he became unresponsive. Patient has a history of seizures dementia and post ictal psychosis. Patient does not remember what happened himself. Patient reports he feels well now has no pain anywhere. He says he takes Keppra for his seizures which agrees with the chart.   Past Medical History:  Diagnosis Date  . Dementia   . Hypertension   . Seizures [facility])     Patient Active Problem List   Diagnosis Date Noted  . 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 Dementia with behavioral disturbance 11/02/2015  . Hypertension 11/02/2015  . Seizures ([place]) 11/02/2015  . Incarcerated hernia     Past Surgical History:  Procedure Laterality Date  . none      Prior to Admission medications   Medication Sig Start Date End Date Taking? Authorizing Provider  haloperidol (HALDOL) 0.5 MG tablet Take 1 tablet (0.5 mg total) by mouth 2 (two) times daily. 03/17/17 03/17/18  Orbie Pyo, MD  levETIRAcetam (KEPPRA) 250 MG tablet Take 1 tablet (250 mg total) by mouth 2 (two) times daily. Take 2 tabs (500mg ) every morning and 3 tabs (750mg ) every evening 03/17/17   Schaevitz, Randall An, MD  losartan-hydrochlorothiazide (HYZAAR) 100-25 MG tablet Take 1 tablet by mouth daily. 03/17/17 03/17/18  Schaevitz, Randall An, MD    Allergies Patient has no known allergies.  History reviewed. No pertinent family history.  Social History Social History   Tobacco Use  . Smoking status: Former Smoker    Years: 25.00  . Smokeless tobacco: Never Used  Substance  Use Topics  . Alcohol use: No    Comment: quit etoh x 3 months ago  . Drug use: Yes    Types: Cocaine, IV    Comment: quit 3 months ago - x 17 year drug and alcohol use    Review of Systems  Constitutional: No fever/chills Eyes: No visual changes. ENT: No sore throat. Cardiovascular: Denies chest pain. Respiratory: Denies shortness of breath. Gastrointestinal: No abdominal pain.  No nausea, no vomiting.  No diarrhea.  No constipation. Genitourinary: Negative for dysuria. Musculoskeletal: Negative for back pain. Skin: Negative for rash. Neurological: Negative for headaches, focal weakness ____________________________________________   PHYSICAL EXAM:  VITAL SIGNS: ED Triage Vitals  Enc Vitals Group     BP 06/25/17 2319 (!) 144/91     Pulse Rate 06/25/17 2319 97     Resp 06/25/17 2319 (!) 22     Temp 06/25/17 2319 98.6 F (37 C)     Temp Source 06/25/17 2319 Oral     SpO2 06/25/17 2319 98 %     Weight 06/25/17 2321 160 lb (72.6 kg)     Height 06/25/17 2321 6\' 1"  (1.854 m)     Head Circumference --      Peak Flow --      Pain Score --      Pain Loc --      Pain Edu? --      Excl. in ? --     Constitutional: Alert and oriented. Well appearing and in no  acute distress. Eyes: Conjunctivae are normal.  Head: Atraumatic. Nose: No congestion/rhinnorhea. Mouth/Throat: Mucous membranes are moist.  Oropharynx non-erythematous. Neck: No stridor.   Cardiovascular: Normal rate, regular rhythm. Grossly normal heart sounds.  Good peripheral circulation. Respiratory: Normal respiratory effort.  No retractions. Lungs CTAB. Gastrointestinal: Soft and nontender. No distention. No abdominal bruits. No CVA tenderness. Musculoskeletal: No lower extremity tenderness nor edema.  No joint effusions. Neurologic:  Normal speech and language. No gross focal neurologic deficits are appreciated. . Skin:  Skin is warm, dry and intact. No rash noted. Psychiatric: Mood and affect are normal.  Speech and behavior are normal.  ____________________________________________   LABS (all labs ordered are listed, but only abnormal results are displayed)  Labs Reviewed  COMPREHENSIVE METABOLIC PANEL - Abnormal; Notable for the following components:      Result Value   CO2 21 (*)    Glucose, Bld 106 (*)    Creatinine, Ser 1.38 (*)    Calcium 8.8 (*)    ALT 13 (*)    GFR calc non Af Amer 50 (*)    GFR calc Af Amer 58 (*)    All other components within normal limits  URINALYSIS, COMPLETE (UACMP) WITH MICROSCOPIC - Abnormal; Notable for the following components:   Color, Urine STRAW (*)    APPearance CLEAR (*)    All other components within normal limits  CBC WITH DIFFERENTIAL/PLATELET - Abnormal; Notable for the following components:   RBC 3.70 (*)    Hemoglobin 11.0 (*)    HCT 32.0 (*)    RDW 15.9 (*)    Neutro Abs 7.1 (*)    All other components within normal limits  TROPONIN I  URINE DRUG SCREEN, QUALITATIVE ([facility] ONLY)  TROPONIN I  CBC WITH DIFFERENTIAL/PLATELET  LEVETIRACETAM LEVEL   ____________________________________________  EKG  EKG read and interpreted by me shows normal sinus rhythm rate of 95 normal axis no acute ST-T wave changes there is increased R wave progression ____________________________________________  RADIOLOGY  ED MD interpretation: Head CT and chest x-ray read as normal by radiology I reviewed the tests chest x-ray and agree  Official radiology report(s): Ct Head Wo Contrast  Result Date: 06/25/2017 CLINICAL DATA:  Altered level of consciousness EXAM: CT HEAD WITHOUT CONTRAST TECHNIQUE: Contiguous axial images were obtained from the base of the skull through the vertex without intravenous contrast. COMPARISON:  03/11/2017 FINDINGS: Brain: No evidence of acute infarction, hemorrhage, hydrocephalus, extra-axial collection or mass lesion/mass effect. Subcortical white matter and periventricular small vessel ischemic changes. Vascular: Mild  intracranial atherosclerosis. Skull: Normal. Negative for fracture or focal lesion. Sinuses/Orbits: The visualized paranasal sinuses are essentially clear. The mastoid air cells are unopacified. Other: None. IMPRESSION: No evidence of acute intracranial abnormality. Mild small vessel ischemic changes. Electronically Signed   By: Julian Hy M.D.   On: 06/25/2017 23:55   Dg Chest Portable 1 View  Result Date: 06/25/2017 CLINICAL DATA:  72 year old male with syncope. EXAM: PORTABLE CHEST 1 VIEW COMPARISON:  Chest CT dated 03/12/2017 FINDINGS: The heart size and mediastinal contours are within normal limits. Both lungs are clear. The visualized skeletal structures are unremarkable. IMPRESSION: No active disease. Electronically Signed   By: Anner Crete M.D.   On: 06/25/2017 23:40    ____________________________________________   PROCEDURES  Procedure(s) performed:   Procedures  Critical Care performed:   ____________________________________________   INITIAL IMPRESSION / ASSESSMENT AND PLAN / ED COURSE  patient's lab work troponin and EKG CTs chest x-ray all look essentially  normal. Most likely diagnosis is that he had a seizure. He does have a seizure disorder. We'll have him continue his medicines and follow with his doctor.         ____________________________________________   FINAL CLINICAL IMPRESSION(S) / ED DIAGNOSES  Final diagnoses:  Seizure [facility])     ED Discharge Orders    None       Note:  This document was prepared using Dragon voice recognition software and may include unintentional dictation errors.    Nena Polio, MD 06/26/17 [phone]

## 2017-06-26 NOTE — Discharge Instructions (Signed)
It looks like you likely had a seizure. Please return for any problems especially headache fever chest pain or anything else. please continue to take your medicine. Please follow-up with your regular doctor.

## 2017-06-28 LAB — LEVETIRACETAM LEVEL: LEVETIRACETAM: 36.3 ug/mL (ref 10.0–40.0)

## 2017-07-09 DIAGNOSIS — R569 Unspecified convulsions: Secondary | ICD-10-CM | POA: Diagnosis not present

## 2017-07-09 DIAGNOSIS — R972 Elevated prostate specific antigen [PSA]: Secondary | ICD-10-CM | POA: Diagnosis not present

## 2017-07-09 DIAGNOSIS — I10 Essential (primary) hypertension: Secondary | ICD-10-CM | POA: Diagnosis not present

## 2017-07-09 DIAGNOSIS — Z0000 Encounter for general adult medical examination without abnormal findings: Secondary | ICD-10-CM | POA: Diagnosis not present

## 2017-07-10 ENCOUNTER — Emergency Department: Payer: Medicare HMO

## 2017-07-10 ENCOUNTER — Encounter: Payer: Self-pay | Admitting: Emergency Medicine

## 2017-07-10 ENCOUNTER — Other Ambulatory Visit: Payer: Self-pay

## 2017-07-10 ENCOUNTER — Emergency Department
Admission: EM | Admit: 2017-07-10 | Discharge: 2017-07-11 | Disposition: A | Discharge status: Home / Self Care | Payer: Medicare HMO | Attending: Emergency Medicine | Admitting: Emergency Medicine

## 2017-07-10 DIAGNOSIS — F0390 Unspecified dementia without behavioral disturbance: Secondary | ICD-10-CM | POA: Insufficient documentation

## 2017-07-10 DIAGNOSIS — Y999 Unspecified external cause status: Secondary | ICD-10-CM | POA: Insufficient documentation

## 2017-07-10 DIAGNOSIS — S022XXA Fracture of nasal bones, initial encounter for closed fracture: Secondary | ICD-10-CM | POA: Insufficient documentation

## 2017-07-10 DIAGNOSIS — R569 Unspecified convulsions: Secondary | ICD-10-CM | POA: Diagnosis not present

## 2017-07-10 DIAGNOSIS — Z79899 Other long term (current) drug therapy: Secondary | ICD-10-CM | POA: Insufficient documentation

## 2017-07-10 DIAGNOSIS — Y939 Activity, unspecified: Secondary | ICD-10-CM | POA: Diagnosis not present

## 2017-07-10 DIAGNOSIS — I10 Essential (primary) hypertension: Secondary | ICD-10-CM | POA: Insufficient documentation

## 2017-07-10 DIAGNOSIS — Z87891 Personal history of nicotine dependence: Secondary | ICD-10-CM | POA: Diagnosis not present

## 2017-07-10 DIAGNOSIS — Y929 Unspecified place or not applicable: Secondary | ICD-10-CM | POA: Diagnosis not present

## 2017-07-10 DIAGNOSIS — W19XXXA Unspecified fall, initial encounter: Secondary | ICD-10-CM | POA: Insufficient documentation

## 2017-07-10 DIAGNOSIS — G40909 Epilepsy, unspecified, not intractable, without status epilepticus: Secondary | ICD-10-CM | POA: Diagnosis not present

## 2017-07-10 LAB — CBC WITH DIFFERENTIAL/PLATELET
BASOS ABS: 0 10*3/uL (ref 0–0.1)
BASOS PCT: 1 %
EOS ABS: 0.2 10*3/uL (ref 0–0.7)
Eosinophils Relative: 4 %
HCT: 31.9 % — ABNORMAL LOW (ref 40.0–52.0)
Hemoglobin: 11.2 g/dL — ABNORMAL LOW (ref 13.0–18.0)
Lymphocytes Relative: 26 %
Lymphs Abs: 1.5 10*3/uL (ref 1.0–3.6)
MCH: 30.5 pg (ref 26.0–34.0)
MCHC: 35.3 g/dL (ref 32.0–36.0)
MCV: 86.5 fL (ref 80.0–100.0)
MONO ABS: 0.4 10*3/uL (ref 0.2–1.0)
MONOS PCT: 8 %
NEUTROS ABS: 3.7 10*3/uL (ref 1.4–6.5)
Neutrophils Relative %: 61 %
Platelets: 263 10*3/uL (ref 150–440)
RBC: 3.68 MIL/uL — ABNORMAL LOW (ref 4.40–5.90)
RDW: 15.8 % — AB (ref 11.5–14.5)
WBC: 5.9 10*3/uL (ref 3.8–10.6)

## 2017-07-10 LAB — BASIC METABOLIC PANEL
ANION GAP: 11 (ref 5–15)
BUN: 18 mg/dL (ref 6–20)
CALCIUM: 9 mg/dL (ref 8.9–10.3)
CO2: 24 mmol/L (ref 22–32)
CREATININE: 1.6 mg/dL — AB (ref 0.61–1.24)
Chloride: 104 mmol/L (ref 101–111)
GFR calc Af Amer: 48 mL/min — ABNORMAL LOW (ref >60)
GFR, EST NON AFRICAN AMERICAN: 42 mL/min — AB (ref >60)
Glucose, Bld: 136 mg/dL — ABNORMAL HIGH (ref 65–99)
Potassium: 3.6 mmol/L (ref 3.5–5.1)
SODIUM: 139 mmol/L (ref 135–145)

## 2017-07-10 LAB — URINALYSIS, COMPLETE (UACMP) WITH MICROSCOPIC
BILIRUBIN URINE: NEGATIVE
Bacteria, UA: NONE SEEN
GLUCOSE, UA: NEGATIVE mg/dL
HGB URINE DIPSTICK: NEGATIVE
KETONES UR: NEGATIVE mg/dL
Leukocytes, UA: NEGATIVE
NITRITE: NEGATIVE
PH: 5 (ref 5.0–8.0)
Protein, ur: NEGATIVE mg/dL
Specific Gravity, Urine: 1.011 (ref 1.005–1.030)

## 2017-07-10 MED ORDER — CEPHALEXIN 500 MG PO CAPS
500.0000 mg | ORAL_CAPSULE | Freq: Once | ORAL | Status: AC
  Administered 2017-07-10: 500 mg via ORAL
  Filled 2017-07-10: qty 1

## 2017-07-10 MED ORDER — CEPHALEXIN 250 MG PO CAPS: 500.0000 mg | ORAL_CAPSULE | Freq: Two times a day (BID) | ORAL | 0 refills | Status: AC

## 2017-07-10 MED ORDER — LEVETIRACETAM 500 MG PO TABS
ORAL_TABLET | ORAL | Status: AC
  Filled 2017-07-10: qty 2

## 2017-07-10 MED ORDER — LEVETIRACETAM IN NACL 1000 MG/100ML IV SOLN
1000.0000 mg | Freq: Once | INTRAVENOUS | Status: AC
  Administered 2017-07-10: 1000 mg via INTRAVENOUS
  Filled 2017-07-10: qty 100

## 2017-07-10 NOTE — ED Notes (Signed)
Patient transported to CT 

## 2017-07-10 NOTE — ED Notes (Signed)
Per brother reports that pt has hx of substance abuse and is worried that may be blocking seizure medicine

## 2017-07-10 NOTE — ED Triage Notes (Addendum)
Patient presents to Emergency Department via [facility] with complaints of fall and witnessed seizure.    Pt with hx of seizures, from group on  [address]   Pt with break in skin to nose, pain there, and lac to right inner eyebrow.  Pads on the side rails

## 2017-07-10 NOTE — ED Notes (Signed)
ED Provider at bedside.  DC planning, brother contacted, on the way to pick up pt

## 2017-07-10 NOTE — Discharge Instructions (Addendum)
I would advise that you consider going up to 500 in the morning and 500 at night of your Keppra, follow closely with the doctors listed above.

## 2017-07-10 NOTE — ED Notes (Signed)
Unable to locate phone number for group home

## 2017-07-10 NOTE — ED Notes (Signed)
Family at bedside. 

## 2017-07-10 NOTE — ED Provider Notes (Addendum)
[facility] Emergency Department Provider Note  ____________________________________________   I have reviewed the triage vital signs and the nursing notes. Where available I have reviewed prior notes and, if possible and indicated, outside hospital notes.    HISTORY  Chief Complaint Seizures and Fall    HPI Jeremy [name] is a 72 y.o. male with a history of seizures, does take Keppra and is compliant with medications according to EMS, took his medications this morning has not yet taken any of his dose had a witnessed seizure, did hit his head, was apparently briefly postictal and now is back to baseline.  No antecedent fever chills illness or other complaints.  He complains of some discomfort to his nose where he hit his face.  He is at baseline according to EMS.  Does have a history of dementia    Past Medical History:  Diagnosis Date  . Dementia   . Hypertension   . Seizures [facility])     Patient Active Problem List   Diagnosis Date Noted  . 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 Dementia with behavioral disturbance 11/02/2015  . Hypertension 11/02/2015  . Seizures ([place]) 11/02/2015  . Incarcerated hernia     Past Surgical History:  Procedure Laterality Date  . none      Prior to Admission medications   Medication Sig Start Date End Date Taking? Authorizing Provider  haloperidol (HALDOL) 0.5 MG tablet Take 1 tablet (0.5 mg total) by mouth 2 (two) times daily. 03/17/17 03/17/18  Orbie Pyo, MD  levETIRAcetam (KEPPRA) 250 MG tablet Take 1 tablet (250 mg total) by mouth 2 (two) times daily. Take 2 tabs (500mg ) every morning and 3 tabs (750mg ) every evening 03/17/17   Schaevitz, Randall An, MD  losartan-hydrochlorothiazide (HYZAAR) 100-25 MG tablet Take 1 tablet by mouth daily. 03/17/17 03/17/18  Schaevitz, Randall An, MD    Allergies Patient has no known allergies.  History reviewed. No pertinent family history.  Social  History Social History   Tobacco Use  . Smoking status: Former Smoker    Years: 25.00  . Smokeless tobacco: Never Used  Substance Use Topics  . Alcohol use: No    Comment: quit etoh x 3 months ago  . Drug use: Yes    Types: Cocaine, IV    Comment: quit 3 months ago - x 17 year drug and alcohol use    Review of Systems Constitutional: No fever/chills Eyes: No visual changes. ENT: No sore throat. No stiff neck no neck pain Cardiovascular: Denies chest pain. Respiratory: Denies shortness of breath. Gastrointestinal:   no vomiting.  No diarrhea.  No constipation. Genitourinary: Negative for dysuria. Musculoskeletal: Negative lower extremity swelling Skin: Negative for rash. Neurological: Negative for severe headaches, focal weakness or numbness.   ____________________________________________   PHYSICAL EXAM:  VITAL SIGNS: ED Triage Vitals [07/10/17 2044]  Enc Vitals Group     BP 123/79     Pulse Rate (!) 103     Resp 20     Temp 98.5 F (36.9 C)     Temp Source Oral     SpO2 96 %     Weight 158 lb (71.7 kg)     Height 6\' 1"  (1.854 m)     Head Circumference      Peak Flow      Pain Score 3     Pain Loc      Pain Edu?      Excl. in [place]?     Constitutional:  Alert and oriented to name and place unsure of the exact date. Well appearing and in no acute distress. Eyes: Conjunctivae are normal Head: Atraumatic except for bruising to the bridge of his nose, positive blood in the nose, no septal hematoma HEENT: No congestion/rhinnorhea. Mucous membranes are moist.  Oropharynx non-erythematous Neck:   Nontender with no meningismus, no masses, no stridor Cardiovascular: Normal rate, regular rhythm. Grossly normal heart sounds.  Good peripheral circulation. Respiratory: Normal respiratory effort.  No retractions. Lungs CTAB. Abdominal: Soft and nontender. No distention. No guarding no rebound Back:  There is no focal tenderness or step off.  there is no midline tenderness  there are no lesions noted. there is no CVA tenderness Musculoskeletal: No lower extremity tenderness, no upper extremity tenderness. No joint effusions, no DVT signs strong distal pulses no edema Neurologic:  Normal speech and language. No gross focal neurologic deficits are appreciated.  Skin:  Skin is warm, dry and intact. No rash noted. Psychiatric: Mood and affect are normal. Speech and behavior are normal.  ____________________________________________   LABS (all labs ordered are listed, but only abnormal results are displayed)  Labs Reviewed  CBC WITH DIFFERENTIAL/PLATELET  BASIC METABOLIC PANEL  URINALYSIS, COMPLETE (UACMP) WITH MICROSCOPIC    Pertinent labs  results that were available during my care of the patient were reviewed by me and considered in my medical decision making (see chart for details). ____________________________________________  EKG  I personally interpreted any EKGs ordered by me or triage  ____________________________________________  RADIOLOGY  Pertinent labs & imaging results that were available during my care of the patient were reviewed by me and considered in my medical decision making (see chart for details). If possible, patient and/or family made aware of any abnormal findings.  No results found. ____________________________________________    PROCEDURES  Procedure(s) performed: None  Procedures  Critical Care performed: None  ____________________________________________   INITIAL IMPRESSION / ASSESSMENT AND PLAN / ED COURSE  Pertinent labs & imaging results that were available during my care of the patient were reviewed by me and considered in my medical decision making (see chart for details).  Seizure precautions placed, I will load the patient with his evening dose of Keppra he usually takes 500 morning 750 at night this was recently increased from 500 twice daily.  We will give him a gram.  I will obtain CT head and face  given age, history of dementia, unclear injury to the head, we will check basic blood work and urinalysis as a precaution given his age again to make sure that there was no precipitating cause of fall that is reassuring I think patient will safely be discharged.  ----------------------------------------- 11:21 PM on 07/10/2017 -----------------------------------------  With history of recurrent seizures had a seizure, we did load him with his medication he will follow closely as an outpatient.  We will start him on Keflex for his possibly open nasal fractures and follow-up with ENT he is in no acute distress.   ____________________________________________   FINAL CLINICAL IMPRESSION(S) / ED DIAGNOSES  Final diagnoses:  None      This chart was dictated using voice recognition software.  Despite best efforts to proofread,  errors can occur which can change meaning.       Schuyler Amor, MD 07/10/17 2100    Schuyler Amor, MD 07/10/17 2322

## 2017-07-11 NOTE — ED Notes (Signed)
Brother here to get pt  [name] [phone], called for report,

## 2017-07-15 DIAGNOSIS — R569 Unspecified convulsions: Secondary | ICD-10-CM | POA: Diagnosis not present

## 2017-07-15 DIAGNOSIS — R200 Anesthesia of skin: Secondary | ICD-10-CM | POA: Diagnosis not present

## 2017-07-15 DIAGNOSIS — G44229 Chronic tension-type headache, not intractable: Secondary | ICD-10-CM | POA: Diagnosis not present

## 2017-07-30 DIAGNOSIS — R569 Unspecified convulsions: Secondary | ICD-10-CM | POA: Diagnosis not present

## 2017-08-05 DIAGNOSIS — R200 Anesthesia of skin: Secondary | ICD-10-CM | POA: Diagnosis not present

## 2017-08-05 DIAGNOSIS — G44229 Chronic tension-type headache, not intractable: Secondary | ICD-10-CM | POA: Diagnosis not present

## 2017-08-05 DIAGNOSIS — R569 Unspecified convulsions: Secondary | ICD-10-CM | POA: Diagnosis not present

## 2017-09-03 ENCOUNTER — Ambulatory Visit: Payer: Self-pay | Admitting: Urology

## 2017-10-16 DIAGNOSIS — Z79899 Other long term (current) drug therapy: Secondary | ICD-10-CM | POA: Diagnosis not present

## 2017-10-16 DIAGNOSIS — I10 Essential (primary) hypertension: Secondary | ICD-10-CM | POA: Diagnosis not present

## 2017-10-16 DIAGNOSIS — R569 Unspecified convulsions: Secondary | ICD-10-CM | POA: Diagnosis not present

## 2017-11-11 ENCOUNTER — Encounter: Payer: Self-pay | Admitting: Urology

## 2017-11-11 ENCOUNTER — Ambulatory Visit (INDEPENDENT_AMBULATORY_CARE_PROVIDER_SITE_OTHER): Payer: Medicare HMO | Admitting: Urology

## 2017-11-11 ENCOUNTER — Other Ambulatory Visit: Payer: Self-pay

## 2017-11-11 VITALS — BP 117/64 | HR 92 | Ht 73.0 in | Wt 151.4 lb

## 2017-11-11 DIAGNOSIS — R972 Elevated prostate specific antigen [PSA]: Secondary | ICD-10-CM

## 2017-11-11 NOTE — Progress Notes (Signed)
11/11/2017 4:35 PM   Jeremy [name] 09/16/1945 [id number]  Referring provider: Idelle Crouch, MD [facility] [place], [place] [address]  Chief Complaint  Patient presents with  . Establish Care    HPI: 72 year old male who lives in a group home referred for evaluation of an elevated PSA.  He presents today with his caretaker.  His most recent PSA results are as follows:  07/2015  6.82 02/2016  7.8 12/2016 11.17  The patient denies prior prostate biopsy or urologic evaluation.  Review of Dr. Doy Hutching records indicate he has been followed by urology.  On record review in [place] there are no urology entries.  He denies bothersome lower urinary tract symptoms.  He has no dysuria or gross hematuria.  He denies flank, abdominal, pelvic or scrotal pain.  He has a history of dementia and seizure disorder.  His caregiver states he does not have a power of attorney and is responsible for making his own decisions.   PMH: Past Medical History:  Diagnosis Date  . Dementia   . Hypertension   . Seizures [facility])     Surgical History: Past Surgical History:  Procedure Laterality Date  . none      Home Medications:  Allergies as of 11/11/2017   No Known Allergies     Medication List        Accurate as of 11/11/17 11:59 PM. Always use your most recent med list.          haloperidol 0.5 MG tablet Commonly known as:  HALDOL Take 1 tablet (0.5 mg total) by mouth 2 (two) times daily.   levETIRAcetam 250 MG tablet Commonly known as:  KEPPRA Take 1 tablet (250 mg total) by mouth 2 (two) times daily. Take 2 tabs (500mg ) every morning and 3 tabs (750mg ) every evening   losartan-hydrochlorothiazide 100-25 MG tablet Commonly known as:  HYZAAR Take 1 tablet by mouth daily.       Allergies: No Known Allergies  Family History: History reviewed. No pertinent family history.  Social History:  reports that he has quit smoking.  He quit after 25.00 years of use. He has never used smokeless tobacco. He reports that he has current or past drug history. Drugs: Cocaine and IV. He reports that he does not drink alcohol.  ROS: UROLOGY Frequent Urination?: No Hard to postpone urination?: No Burning/pain with urination?: No Get up at night to urinate?: No Leakage of urine?: No Urine stream starts and stops?: No Trouble starting stream?: No Do you have to strain to urinate?: No Blood in urine?: No Urinary tract infection?: No Sexually transmitted disease?: No Injury to kidneys or bladder?: No Painful intercourse?: No Weak stream?: No Erection problems?: No Penile pain?: No  Gastrointestinal Nausea?: No Vomiting?: No Indigestion/heartburn?: No Diarrhea?: No Constipation?: No  Constitutional Fever: No Night sweats?: No Weight loss?: No Fatigue?: No  Skin Skin rash/lesions?: No Itching?: No  Eyes Blurred vision?: No Double vision?: No  Ears/Nose/Throat Sore throat?: No Sinus problems?: No  Hematologic/Lymphatic Swollen glands?: No Easy bruising?: No  Cardiovascular Leg swelling?: No Chest pain?: No  Respiratory Cough?: No Shortness of breath?: No  Endocrine Excessive thirst?: No  Musculoskeletal Back pain?: No Joint pain?: No  Neurological Headaches?: No Dizziness?: No  Psychologic Depression?: No Anxiety?: No  Physical Exam: BP 117/64   Pulse 92   Ht 6\' 1"  (1.854 m)   Wt 151 lb 6.4 oz (68.7 kg)   BMI  19.97 kg/m   Constitutional:  Alert and oriented, No acute distress. HEENT: [place] AT, moist mucus membranes.  Trachea midline, no masses. Cardiovascular: No clubbing, cyanosis, or edema. Respiratory: Normal respiratory effort, no increased work of breathing. GI: Abdomen is soft, nontender, nondistended, no abdominal masses GU: No CVA tenderness.  Prostate 40 g, smooth without nodules Lymph: No cervical or inguinal lymphadenopathy. Skin: No rashes, bruises or suspicious  lesions. Neurologic: Grossly intact, no focal deficits, moving all 4 extremities. Psychiatric: Normal mood and affect.   Assessment & Plan:   72 year old male with a rising PSA and benign DRE.  He denies prior urologic evaluation or biopsy however Dr. Doy Hutching notes indicate he has been seen by a urologist.  Will check his office to see if they have any available notes or any record of who he is seen.  If he has had a prior biopsy will repeat his PSA and if elevated above baseline would recommend a prostate MRI.  Records were checked at [facility].  The patient has never been evaluated for his elevated PSA and will proceed with biopsy.  The procedure was discussed including potential risks of bleeding and infection/sepsis.  Will plan on performing under sedation as he had significant discomfort with DRE.   [name], [place] [address], [place], [place] [address] [phone]

## 2017-11-20 ENCOUNTER — Encounter: Payer: Self-pay | Admitting: Urology

## 2017-11-25 ENCOUNTER — Other Ambulatory Visit: Payer: Self-pay | Admitting: Urology

## 2017-11-25 DIAGNOSIS — R972 Elevated prostate specific antigen [PSA]: Secondary | ICD-10-CM

## 2017-11-26 ENCOUNTER — Other Ambulatory Visit: Payer: Self-pay | Admitting: Radiology

## 2017-11-26 DIAGNOSIS — R972 Elevated prostate specific antigen [PSA]: Secondary | ICD-10-CM

## 2017-12-07 ENCOUNTER — Inpatient Hospital Stay: Admission: RE | Admit: 2017-12-07 | Payer: Medicare HMO | Source: Ambulatory Visit

## 2017-12-09 ENCOUNTER — Other Ambulatory Visit: Payer: Self-pay

## 2017-12-09 ENCOUNTER — Encounter
Admission: RE | Admit: 2017-12-09 | Discharge: 2017-12-09 | Disposition: A | Discharge status: Home / Self Care | Payer: Medicare HMO | Source: Ambulatory Visit | Attending: Urology | Admitting: Urology

## 2017-12-09 DIAGNOSIS — Z01812 Encounter for preprocedural laboratory examination: Secondary | ICD-10-CM | POA: Diagnosis not present

## 2017-12-09 NOTE — Patient Instructions (Addendum)
  Your procedure is scheduled on: Tuesday December 15, 2017 Report to Same Day Surgery 2nd floor [facility] Entrance-take elevator on left to 2nd floor.  Check in with surgery information desk.) To find out your arrival time, call [phone] 1:00-3:00 PM on Monday December 14, 2017  Remember: Instructions that are not followed completely may result in serious medical risk, up to and including death, or upon the discretion of your surgeon and anesthesiologist your surgery may need to be rescheduled.    __x__ 1. Do not eat food (including mints, candies, chewing gum) after midnight the night before your procedure. You may drink water up to 2 hours before you are scheduled to arrive at the hospital for your procedure.  Do not drink anything within 2 hours of your scheduled arrival to the hospital.    __x__ 2. No Alcohol for 24 hours before or after surgery.   __x__ 3. No Smoking or e-cigarettes for 24 hours before surgery.  Do not use any chewable tobacco products for at least 6 hours before surgery.   __x__ 4. Notify your doctor if there is any change in your medical condition (cold, fever, infections).   __x__ 5. On the morning of surgery brush your teeth with toothpaste and water.  You may rinse your mouth with mouthwash if you wish.  Do not swallow any toothpaste or mouthwash.   __x__ Use antibacterial soap such as Dial to shower/bathe on the day of surgery.   Do not wear jewelry, lotions, powders, deodorant or perfumes on the day of surgery.  Do not shave below the face/neck 48 hours prior to surgery.   Do not bring valuables to the hospital.    [facility] is not responsible for any belongings or valuables.               Contacts, dentures or bridgework may not be worn into surgery.  For patients discharged on the day of surgery, you will NOT be permitted to drive yourself home.  You must have a responsible adult with you for 24 hours after surgery.  __x__ On the  morning of surgery, take these medicines with a small sip of water:  1. Levetiracetam (Keppra)  2. Risperidone (Risperdal)  Do NOT take Losartan-HCTZ (Hyzaar) on the morning of surgery.  __x__ Follow recommendations from Cardiologist, Pulmonologist or PCP regarding stopping Aspirin, Coumadin, Plavix, Eliquis, Effient, Pradaxa, and Pletal.  __x__ TODAY: Stop Anti-inflammatories such as Advil, Ibuprofen, Motrin, Aleve, Naproxen, Naprosyn, BC/Goodies powders or aspirin products. You may continue to take Tylenol and Celebrex.   __x__ TODAY: Stop supplements until after surgery. You may continue to take Vitamin D, Vitamin B, and multivitamin.

## 2017-12-09 NOTE — Pre-Procedure Instructions (Signed)
Pre-op instructions reviewed with caregiver.  Pre-op instruction sheet given to caregiver.

## 2017-12-10 LAB — URINE CULTURE: Culture: 20000 — AB

## 2017-12-10 NOTE — Pre-Procedure Instructions (Addendum)
SPOKE [employer]. NO LEGAL POA. STATES GROUP HOME CAN SIGN PAPERS. HE PLANS TO BE HERE DOS. GIVEN ARRIVAL TIME 0715  AND OR NOTIFIED TO LEAVE POSTED START TIME. IF UNABLE TO DO SO, GROUP HOME AND BROTHER WILL NEED TO BE NOTIFIED SPOKE WITH MANAGER OF GROUP HOME WHO STATED THEY DO NOT HAVE LEGAL PAPERS TO SIGN FOR PATIENT AND BROTHER JOHN WILL BE PRESENT DAY OF SURGERY

## 2017-12-11 NOTE — Progress Notes (Signed)
UCx results faxed to Dr. Dene Gentry office.

## 2017-12-14 MED ORDER — SODIUM CHLORIDE 0.9 % IV SOLN
1.0000 g | INTRAVENOUS | Status: AC
  Administered 2017-12-15: 1 g via INTRAVENOUS
  Filled 2017-12-14: qty 1

## 2017-12-15 ENCOUNTER — Encounter: Payer: Self-pay | Admitting: Urology

## 2017-12-15 ENCOUNTER — Ambulatory Visit: Payer: Medicare HMO | Admitting: Certified Registered"

## 2017-12-15 ENCOUNTER — Ambulatory Visit
Admission: RE | Admit: 2017-12-15 | Discharge: 2017-12-15 | Disposition: A | Discharge status: Home / Self Care | Payer: Medicare HMO | Source: Ambulatory Visit | Attending: Urology | Admitting: Urology

## 2017-12-15 ENCOUNTER — Encounter
Admission: RE | Disposition: A | Discharge status: Home / Self Care | Payer: Self-pay | Source: Ambulatory Visit | Attending: Urology

## 2017-12-15 DIAGNOSIS — R972 Elevated prostate specific antigen [PSA]: Secondary | ICD-10-CM

## 2017-12-15 DIAGNOSIS — E669 Obesity, unspecified: Secondary | ICD-10-CM | POA: Insufficient documentation

## 2017-12-15 DIAGNOSIS — F0390 Unspecified dementia without behavioral disturbance: Secondary | ICD-10-CM | POA: Insufficient documentation

## 2017-12-15 DIAGNOSIS — F209 Schizophrenia, unspecified: Secondary | ICD-10-CM | POA: Insufficient documentation

## 2017-12-15 DIAGNOSIS — C61 Malignant neoplasm of prostate: Secondary | ICD-10-CM | POA: Diagnosis not present

## 2017-12-15 DIAGNOSIS — I10 Essential (primary) hypertension: Secondary | ICD-10-CM | POA: Diagnosis not present

## 2017-12-15 DIAGNOSIS — Z87891 Personal history of nicotine dependence: Secondary | ICD-10-CM | POA: Diagnosis not present

## 2017-12-15 HISTORY — PX: PROSTATE BIOPSY: SHX241

## 2017-12-15 SURGERY — BIOPSY, PROSTATE, RECTAL APPROACH, WITH US GUIDANCE
Anesthesia: General

## 2017-12-15 MED ORDER — FENTANYL CITRATE (PF) 100 MCG/2ML IJ SOLN
INTRAMUSCULAR | Status: AC
  Filled 2017-12-15: qty 2

## 2017-12-15 MED ORDER — FENTANYL CITRATE (PF) 100 MCG/2ML IJ SOLN: 25.0000 ug | INTRAMUSCULAR | Status: DC | PRN

## 2017-12-15 MED ORDER — PHENYLEPHRINE HCL 10 MG/ML IJ SOLN
INTRAMUSCULAR | Status: DC | PRN
  Administered 2017-12-15: 50 ug via INTRAVENOUS
  Administered 2017-12-15: 100 ug via INTRAVENOUS

## 2017-12-15 MED ORDER — PROPOFOL 500 MG/50ML IV EMUL
INTRAVENOUS | Status: DC | PRN
  Administered 2017-12-15: 120 ug/kg/min via INTRAVENOUS

## 2017-12-15 MED ORDER — SODIUM CHLORIDE 0.9 % IV SOLN
INTRAVENOUS | Status: DC
  Administered 2017-12-15: 08:00:00 via INTRAVENOUS

## 2017-12-15 MED ORDER — FENTANYL CITRATE (PF) 100 MCG/2ML IJ SOLN
INTRAMUSCULAR | Status: DC | PRN
  Administered 2017-12-15: 25 ug via INTRAVENOUS

## 2017-12-15 MED ORDER — PHENYLEPHRINE HCL 10 MG/ML IJ SOLN
INTRAMUSCULAR | Status: AC
  Filled 2017-12-15: qty 1

## 2017-12-15 MED ORDER — LIDOCAINE HCL (CARDIAC) PF 100 MG/5ML IV SOSY
PREFILLED_SYRINGE | INTRAVENOUS | Status: DC | PRN
  Administered 2017-12-15: 50 mg via INTRAVENOUS

## 2017-12-15 MED ORDER — FENTANYL CITRATE (PF) 100 MCG/2ML IJ SOLN
INTRAMUSCULAR | Status: DC | PRN
  Administered 2017-12-15: 12.5 ug via INTRAVENOUS
  Administered 2017-12-15: 25 ug via INTRAVENOUS

## 2017-12-15 MED ORDER — ONDANSETRON HCL 4 MG/2ML IJ SOLN: 4.0000 mg | Freq: Once | INTRAMUSCULAR | Status: DC | PRN

## 2017-12-15 MED ORDER — PROPOFOL 10 MG/ML IV BOLUS
INTRAVENOUS | Status: DC | PRN
  Administered 2017-12-15: 50 mg via INTRAVENOUS

## 2017-12-15 MED ORDER — LIDOCAINE HCL (PF) 2 % IJ SOLN
INTRAMUSCULAR | Status: AC
  Filled 2017-12-15: qty 10

## 2017-12-15 MED ORDER — FAMOTIDINE 20 MG PO TABS
ORAL_TABLET | ORAL | Status: AC
  Administered 2017-12-15: 20 mg via ORAL
  Filled 2017-12-15: qty 1

## 2017-12-15 MED ORDER — PROPOFOL 10 MG/ML IV BOLUS
INTRAVENOUS | Status: AC
  Filled 2017-12-15: qty 20

## 2017-12-15 MED ORDER — FAMOTIDINE 20 MG PO TABS
20.0000 mg | ORAL_TABLET | Freq: Once | ORAL | Status: AC
  Administered 2017-12-15: 20 mg via ORAL

## 2017-12-15 MED ORDER — MIDAZOLAM HCL 2 MG/2ML IJ SOLN
INTRAMUSCULAR | Status: DC | PRN
  Administered 2017-12-15: 1 mg via INTRAVENOUS
  Administered 2017-12-15: 2 mg via INTRAVENOUS

## 2017-12-15 SURGICAL SUPPLY — 10 items
COVER MAYO STAND STRL (DRAPES) ×3 IMPLANT
DRAPE SHEET LG 3/4 BI-LAMINATE (DRAPES) ×3 IMPLANT
DRSG TELFA 3X8 NADH (GAUZE/BANDAGES/DRESSINGS) ×3 IMPLANT
DRSG TELFA 4X3 1S NADH ST (GAUZE/BANDAGES/DRESSINGS) ×3 IMPLANT
GAUZE SPONGE 4X4 12PLY STRL (GAUZE/BANDAGES/DRESSINGS) ×3 IMPLANT
GLOVE BIO SURGEON STRL SZ8 (GLOVE) ×3 IMPLANT
GUIDE NEEDLE ENDOCAV 16-18 CVR (NEEDLE) ×3 IMPLANT
INST BIOPSY MAXCORE 18GX25 (NEEDLE) ×3 IMPLANT
KIT TURNOVER CYSTO (KITS) ×3 IMPLANT
NEEDLE GUIDE BIOPSY 644068 (NEEDLE) ×3 IMPLANT

## 2017-12-15 NOTE — Anesthesia Post-op Follow-up Note (Signed)
Anesthesia QCDR form completed.        

## 2017-12-15 NOTE — Anesthesia Preprocedure Evaluation (Signed)
Anesthesia Evaluation  Patient identified by MRN, date of birth, ID band Patient awake    Reviewed: Allergy & Precautions, NPO status , Patient's Chart, lab work & pertinent test results  History of Anesthesia Complications Negative for: history of anesthetic complications  Airway Mallampati: III  TM Distance: >3 FB Neck ROM: Full    Dental  (+) Poor Dentition, Missing   Pulmonary neg sleep apnea, neg COPD, former smoker,    breath sounds clear to auscultation- rhonchi (-) wheezing      Cardiovascular hypertension, Pt. on medications (-) CAD, (-) Past MI, (-) Cardiac Stents and (-) CABG  Rhythm:Regular Rate:Normal - Systolic murmurs and - Diastolic murmurs    Neuro/Psych Seizures - (last seizure May 2019),  PSYCHIATRIC DISORDERS Schizophrenia Dementia    GI/Hepatic negative GI ROS, Neg liver ROS,   Endo/Other  negative endocrine ROSneg diabetes  Renal/GU negative Renal ROS     Musculoskeletal negative musculoskeletal ROS (+)   Abdominal (+) - obese,   Peds  Hematology negative hematology ROS (+)   Anesthesia Other Findings Past Medical History: No date: Dementia ([facility]) No date: Hypertension No date: Seizures ([place])   Reproductive/Obstetrics                             Anesthesia Physical Anesthesia Plan  ASA: II  Anesthesia Plan: General   Post-op Pain Management:    Induction: Intravenous  PONV Risk Score and Plan: 1 and Propofol infusion  Airway Management Planned: Natural Airway  Additional Equipment:   Intra-op Plan:   Post-operative Plan:   Informed Consent: I have reviewed the patients History and Physical, chart, labs and discussed the procedure including the risks, benefits and alternatives for the proposed anesthesia with the patient or authorized representative who has indicated his/her understanding and acceptance.   Dental advisory given  Plan Discussed with:  CRNA and Anesthesiologist  Anesthesia Plan Comments:         Anesthesia Quick Evaluation

## 2017-12-15 NOTE — Interval H&P Note (Signed)
History and Physical Interval Note:  12/15/2017 8:19 AM  Jeremy [name]  has presented today for surgery, with the diagnosis of Elevated PSA  The various methods of treatment have been discussed with the patient and family. After consideration of risks, benefits and other options for treatment, the patient has consented to  Procedure(s): BIOPSY TRANSRECTAL ULTRASONIC PROSTATE (TUBP) (N/A) as a surgical intervention .  The patient's history has been reviewed, patient examined, no change in status, stable for surgery.  I have reviewed the patient's chart and labs.  Questions were answered to the patient's satisfaction.     [place]

## 2017-12-15 NOTE — Progress Notes (Signed)
Some scrotal swelling noted but brother states this is no different from what it has been

## 2017-12-15 NOTE — Op Note (Signed)
Preoperative diagnosis:  1. Elevated PSA  Postoperative diagnosis:  1. Elevated PSA  Procedure: 1. Transrectal ultrasound prostate 2. Prostate biopsies  Surgeon: [name], MD  Anesthesia: MAC  Complications: None  Intraoperative findings: Prostate volume 38 cc.  Scattered prostatic calculi transition zone; no echogenic abnormalities of the peripheral zone noted  EBL: Minimal  Specimens: Prostate biopsies x12  Indication: Jeremy [name] is a 72 y.o. patient with an elevated PSA of 11.17 and a rising PSA over the past 2 years.  He has dementia and is scheduled for prostate biopsy under sedation.  After reviewing the management options for treatment, he elected to proceed with the above surgical procedure(s). We have discussed the potential benefits and risks of the procedure, side effects of the proposed treatment, the likelihood of the patient achieving the goals of the procedure, and any potential problems that might occur during the procedure or recuperation. Informed consent has been obtained.  Description of procedure:  The patient was taken to the operating room and placed in the left lateral decubitus position on the stretcher.  Sedation was obtained by anesthesia.  Preoperative antibiotics were administered. A preoperative time-out was performed.   DRE was performed and the prostate was approximately 40 g without nodules.  A transrectal ultrasound probe was lubricated and gently passed per rectum.  Transverse and sagittal imaging was performed with findings as described above.  Standard 12 core biopsies were performed from the mid and lateral portions of the base, mid prostate and apex bilaterally.  2 cores were placed in each jar.  No significant bleeding was noted.  The patient was transported to the PACU in stable condition.  Plan: He will be scheduled for postop follow-up for pathology review.   [name], M.D.

## 2017-12-15 NOTE — H&P (Signed)
   1946-01-15 8:12 AM   Jeremy [name] 1945/04/12 [id number]  Referring provider: No referring provider defined for this encounter.  HPI: 72 year old male with a rising PSA.  His PSA was 6.8 20 July 2015 and a recent PSA November 2018 was 11.17.  He is scheduled for transrectal ultrasound and biopsy of prostate under sedation.  He has dementia and lives in a group home.   PMH: Past Medical History:  Diagnosis Date  . Dementia ([place])   . Hypertension   . Seizures [facility])     Surgical History: Past Surgical History:  Procedure Laterality Date  . none      Home Medications:  haloperidol 0.5 MG tablet Commonly known as:  HALDOL Take 1 tablet (0.5 mg total) by mouth 2 (two) times daily.   levETIRAcetam 250 MG tablet Commonly known as:  KEPPRA Take 1 tablet (250 mg total) by mouth 2 (two) times daily. Take 2 tabs (500mg ) every morning and 3 tabs (750mg ) every evening   losartan-hydrochlorothiazide 100-25 MG tablet Commonly known as:  HYZAAR Take 1 tablet by mouth daily.     Allergies: No Known Allergies  Family History: No family history on file.  Social History:  reports that he has quit smoking. He quit after 25.00 years of use. He has never used smokeless tobacco. He reports that he drank alcohol. He reports that he has current or past drug history. Drugs: Cocaine and IV.  ROS: No change from 11/11/2017  Physical Exam: BP (!) 136/91   Pulse 96   Temp 97.7 F (36.5 C) (Tympanic)   Resp 17   SpO2 99%   Constitutional:  Alert and oriented, No acute distress. HEENT: [name], moist mucus membranes.  Trachea midline, no masses. Cardiovascular: No clubbing, cyanosis, or edema.  RRR Respiratory: Normal respiratory effort, no increased work of breathing.  Lungs clear GI: Abdomen is soft, nontender, nondistended, no abdominal masses GU: No CVA tenderness.  Prostate 40 g, smooth without nodules Lymph: No cervical or inguinal lymphadenopathy. Skin: No rashes, bruises  or suspicious lesions. Neurologic: Grossly intact, no focal deficits, moving all 4 extremities. Psychiatric: Normal mood and affect.   Assessment & Plan:   72 year old male with an elevated PSA scheduled for transrectal ultrasound and biopsy of the prostate.  The procedure has been discussed including potential risks of bleeding and infection/sepsis.   [name], [place] [address] [place], [place] [address] [phone]

## 2017-12-15 NOTE — Anesthesia Procedure Notes (Signed)
Performed by: Khalie Wince, CRNA Pre-anesthesia Checklist: Patient identified, Emergency Drugs available, Suction available, Patient being monitored and Timeout performed Oxygen Delivery Method: Nasal cannula       

## 2017-12-15 NOTE — Anesthesia Postprocedure Evaluation (Signed)
Anesthesia Post Note  Patient: LESEAN WOOLVERTON  Procedure(s) Performed: BIOPSY TRANSRECTAL ULTRASONIC PROSTATE (TUBP) (N/A )  Patient location during evaluation: PACU Anesthesia Type: General Level of consciousness: awake and alert Pain management: pain level controlled Vital Signs Assessment: post-procedure vital signs reviewed and stable Respiratory status: spontaneous breathing, nonlabored ventilation and respiratory function stable Cardiovascular status: blood pressure returned to baseline and stable Postop Assessment: no signs of nausea or vomiting Anesthetic complications: no     Last Vitals:  Vitals:   12/15/17 1028 12/15/17 1031  BP: 116/65 128/67  Pulse: 91 92  Resp: 15 14  Temp:    SpO2:  94%    Last Pain:  Vitals:   12/15/17 1016  TempSrc:   PainSc: 0-No pain                 Joss Mcdill

## 2017-12-15 NOTE — Transfer of Care (Signed)
Immediate Anesthesia Transfer of Care Note  Patient: Jeremy [name]  Procedure(s) Performed: BIOPSY TRANSRECTAL ULTRASONIC PROSTATE (TUBP) (N/A )  Patient Location: PACU  Anesthesia Type:MAC  Level of Consciousness: drowsy and responds to stimulation  Airway & Oxygen Therapy: Patient Spontanous Breathing and Patient connected to nasal cannula oxygen  Post-op Assessment: Report given to RN and Post -op Vital signs reviewed and stable  Post vital signs: Reviewed and stable  Last Vitals:  Vitals Value Taken Time  BP 97/70 12/15/2017  9:12 AM  Temp    Pulse 70 12/15/2017  9:12 AM  Resp 13 12/15/2017  9:12 AM  SpO2 100 % 12/15/2017  9:12 AM    Last Pain:  Vitals:   12/15/17 0755  TempSrc: Tympanic  PainSc: 0-No pain         Complications: No apparent anesthesia complications

## 2017-12-18 LAB — SURGICAL PATHOLOGY

## 2018-01-01 ENCOUNTER — Telehealth: Payer: Self-pay | Admitting: Urology

## 2018-01-01 NOTE — Telephone Encounter (Signed)
-----   Message from [name], MD sent at 01/01/2018  7:28 AM EST ----- Needs follow-up appointment for prostate biopsy results

## 2018-01-01 NOTE — Telephone Encounter (Signed)
App made Patient's caregiver is aware  Sharyn Lull

## 2018-01-06 NOTE — Progress Notes (Addendum)
01/07/2018 1:46 PM   Jeremy [name] Aug 04, 1945 [id number]  Referring provider: Idelle Crouch, MD [place] [facility] [place], [place] [address]  Chief Complaint  Patient presents with   Results    HPI: Jeremy [name] is a 72 yo M who has a history of elevated PSA  presents today for staged biopsy follow-up.  He is here today with his group home coordinator.  His most recent PSA results are as follows:  07/2015        6.82 02/2016        7.8 12/2016     11.17  Pt reports of doing well and has no symptoms after the prostate biopsy.   The pt has a personal history of dementia and seizure disorder. His caregiver states his brother is a power of attorney and his group home coordinator will discuss pertaining to what to do next.   PMH: Past Medical History:  Diagnosis Date   Dementia (Jeremy [name])    Hypertension    Seizures ([place])     Surgical History: Past Surgical History:  Procedure Laterality Date   none     PROSTATE BIOPSY N/A 12/15/2017   Procedure: BIOPSY TRANSRECTAL ULTRASONIC PROSTATE (TUBP);  Surgeon: Jeremy Sons, MD;  Location: [facility] ORS;  Service: Urology;  Laterality: N/A;    Home Medications:  Allergies as of 01/07/2018   No Known Allergies     Medication List        Accurate as of 01/07/18  1:46 PM. Always use your most recent med list.          acetaminophen 325 MG tablet Commonly known as:  TYLENOL Take 650 mg by mouth every 4 (four) hours as needed for moderate pain, fever or headache.   bismuth subsalicylate 300 TM/22QJ suspension Commonly known as:  PEPTO BISMOL Take 15 mLs by mouth every 4 (four) hours as needed (for nausea).   GERI-LANTA 200-200-20 MG/5ML suspension Generic drug:  alum & mag hydroxide-simeth Take 30 mLs by mouth daily as needed for indigestion or heartburn.   guaiFENesin 100 MG/5ML liquid Commonly known as:  ROBITUSSIN Take 200 mg by mouth every 6 (six) hours as needed for  cough.   levETIRAcetam 750 MG tablet Commonly known as:  KEPPRA   loperamide 2 MG capsule Commonly known as:  IMODIUM Take 2 mg by mouth 4 (four) times daily as needed for diarrhea or loose stools.   losartan-hydrochlorothiazide 100-25 MG tablet Commonly known as:  HYZAAR Take 1 tablet by mouth daily.   magnesium hydroxide 400 MG/5ML suspension Commonly known as:  MILK OF MAGNESIA Take 30 mLs by mouth daily as needed for mild constipation.   risperiDONE 2 MG tablet Commonly known as:  RISPERDAL Take 1 mg by mouth 3 (three) times daily.       Allergies: No Known Allergies  Family History: No family history on file.  Social History:  reports that he has quit smoking. He quit after 25.00 years of use. He has never used smokeless tobacco. He reports that he drank alcohol. He reports that he has current or past drug history. Drugs: Cocaine and IV.  ROS: UROLOGY Frequent Urination?: No Hard to postpone urination?: No Burning/pain with urination?: No Get up at night to urinate?: No Leakage of urine?: No Urine stream starts and stops?: No Trouble starting stream?: No Do you have to strain to urinate?: No Blood in urine?: No Urinary tract infection?: No Sexually transmitted disease?: No Injury to  kidneys or bladder?: No Painful intercourse?: No Weak stream?: No Erection problems?: No Penile pain?: No  Gastrointestinal Nausea?: No Vomiting?: No Indigestion/heartburn?: No Diarrhea?: No Constipation?: No  Constitutional Fever: No Night sweats?: No Weight loss?: No Fatigue?: No  Skin Skin rash/lesions?: No Itching?: No  Eyes Blurred vision?: No Double vision?: No  Ears/Nose/Throat Sore throat?: No Sinus problems?: No  Hematologic/Lymphatic Swollen glands?: No Easy bruising?: No  Cardiovascular Leg swelling?: No Chest pain?: No  Respiratory Cough?: No Shortness of breath?: No  Endocrine Excessive thirst?: No  Musculoskeletal Back pain?:  No Joint pain?: No  Neurological Headaches?: No Dizziness?: No  Psychologic Depression?: No Anxiety?: No  Physical Exam: BP 122/73 (BP Location: Left Arm, Patient Position: Sitting, Cuff Size: Normal)    Pulse 92    Ht 5\' 6"  (1.676 m)    Wt 146 lb 12.8 oz (66.6 kg)    BMI 23.69 kg/m   Constitutional:  Alert and oriented, No acute distress. HEENT: [place] AT, moist mucus membranes.  Trachea midline, no masses. Cardiovascular: No clubbing, cyanosis, or edema. Respiratory: Normal respiratory effort, no increased work of breathing. Skin: No rashes, bruises or suspicious lesions. Neurologic: Grossly intact, no focal deficits, moving all 4 extremities. Psychiatric: Normal mood and affect.  Laboratory Data: Results for orders placed or performed during the hospital encounter of 12/15/17  Surgical pathology  Result Value Ref Range   SURGICAL PATHOLOGY      Surgical Pathology CASE: [id number] PATIENT: Jeremy [name] Surgical Pathology Report     SPECIMEN SUBMITTED: A. Prostate, right base; CNB B. Prostate, right mid; CNB C. Prostate, right apex; CNB D. Prostate, left base; CNB E. Prostate, left mid; CNB F. Prostate, left apex; CNB  CLINICAL HISTORY: PSA 07/2015 - 6.82; 02/2016 -  7.8; 12/2016  - 11.17  PRE-OPERATIVE DIAGNOSIS: Elevated PSA, benign DRE  POST-OPERATIVE DIAGNOSIS: Same as pre-op     DIAGNOSIS: A. PROSTATE, RIGHT BASE; CORE NEEDLE BIOPSY: - ADENOCARCINOMA INVOLVING 2 OF 2 CORES. - GLEASON SCORE: 4 + 4 = 8 - GRADE GROUP: 4 - ESTIMATED PERCENTAGE OF TUMOR IN EACH CORE: 75% AND 50% - PERINEURAL INVASION PRESENT.  B. PROSTATE, RIGHT MID; CORE NEEDLE BIOPSY: - ADENOCARCINOMA INVOLVING 2 OF 2 CORES. - GLEASON SCORE 4 + 3 = 7; PATTERN 4 = 90% - GRADE GROUP: 3 - ESTIMATED PERCENTAGE OF TUMOR IN EACH CORE: 80% AND 50%  C. PROSTATE, RIGHT APEX; CORE NEEDLE BIOPSY: - ADENOCARCINOMA INVOLV ING 1 CORE. - GLEASON SCORE 3 + 4 = 7; PATTERN 4 = 10% - GRADE  GROUP: 2 - ESTIMATED PERCENTAGE OF TUMOR: 70% - PERINEURAL INVASION PRESENT. - SEPARATE FRAGMENT OF BENIGN RECTAL TISSUE.  D. PROSTATE, LEFT BASE; CORE NEEDLE BIOPSY: - ADENOCARCINOMA INVOLVING 2 OF 2 CORES. - GLEASON SCORE: 4 + 3 = 7; PATTERN 4 = 60% - GRADE GROUP: 3 - ESTIMATED PERCENTAGE OF TUMOR IN EACH CORE: 40% AND 5% - PERINEURAL INVASION PRESENT.  E. PROSTATE, LEFT MID; CORE NEEDLE BIOPSY: - ADENOCARCINOMA INVOLVING ONE CORE. - GLEASON SCORE: 4 + 4 = 8 - GRADE GROUP 4 - ESTIMATED PERCENTAGE OF TUMOR: 50% - PERINEURAL INVASION PRESENT. - SEPARATE FRAGMENTS OF BENIGN RECTAL TISSUE.  F. PROSTATE, LEFT APEX; CORE NEEDLE BIOPSY: - FRAGMENTS OF BENIGN RECTAL TISSUE.  Comment: The diagnosis was called to Dr. Bernardo Heater on 12/18/2017 at 1:26 PM.   GROSS DESCRIPTION: A. Labeled: Right base Received: In formalin Tissue fragment(s): 2 Size: 0.9 to 1.0 cm in length and in diameter 0.1 cm Descriptio  n: Tan cores Entirely submitted in one cassette.  B. Labeled: Right mid Received: In formalin Tissue fragment(s): 2 Size: 1.2 to 1.4 cm in length and in diameter 0.1 cm Description: Tan cores Entirely submitted in one cassette.  C. Labeled: Right apex Received: In formalin Tissue fragment(s): 2 Size: 0.3-1.4 cm in length and in diameter 0.1 cm Description: Tan cores Entirely submitted in one cassette.  D. Labeled: Left base Received: In formalin Tissue fragment(s): 2 Size: 0.8 and 1.5 cm in length and in diameter 0.1 cm Description: Tan cores Entirely submitted in one cassette.  E. Labeled: Left mid Received: In formalin Tissue fragment(s): 4 Size: Less than 0.1 to 0.4 cm in length and in diameter 0.1 cm Description: Tan cores of tissue, 2 possibly gauze material Entirely submitted in in formalin.  F. Labeled: Left apex Received: In formalin Tissue fragment(s): 3 Size: 0.3 to 0.9 cm in length and in diameter 0.1 cm Description: Tan fragments Entirely  submitted  in one cassette.   Final Diagnosis performed by Bryan Lemma, MD.   Electronically signed 12/18/2017 1:27:53PM The electronic signature indicates that the named Attending Pathologist has evaluated the specimen  Technical component performed at [facility], [address] [place], [place] [address] Lab: [phone] Dir: Rush Farmer, MD, MMM  Professional component performed at [facility], [facility], [place], [place], [place] [address] Lab: [phone] Dir: Dellia Nims. Reuel Derby, MD    Surgical pathology reviewed personally and with patient.    Assessment & Plan:   1. Clinical T1C High Risk Prostate Cancer  -Pathology report shows high risk prostate cancer.  -A CT scan of the abd/pelvis and bone scan is scheduled  -Advised to schedule for radiation oncology consult  -If CT scan and bone shows metastasis, then the option of radiation therapy and hormonal therapy were discussed -Benefits and complications of radiation and hormonal therapy were discussed.    [facility] [address], [place], [place] [address] [phone]   I, Lucas Mallow, am acting as a scribe for Dr. Abbie [name],

## 2018-01-07 ENCOUNTER — Ambulatory Visit (INDEPENDENT_AMBULATORY_CARE_PROVIDER_SITE_OTHER): Payer: Medicare HMO | Admitting: Urology

## 2018-01-07 ENCOUNTER — Encounter: Payer: Self-pay | Admitting: Urology

## 2018-01-07 VITALS — BP 122/73 | HR 92 | Ht 66.0 in | Wt 146.8 lb

## 2018-01-07 DIAGNOSIS — C61 Malignant neoplasm of prostate: Secondary | ICD-10-CM | POA: Diagnosis not present

## 2018-01-28 ENCOUNTER — Ambulatory Visit
Admission: RE | Admit: 2018-01-28 | Discharge: 2018-01-28 | Disposition: A | Discharge status: Home / Self Care | Payer: Medicare HMO | Source: Ambulatory Visit | Attending: Urology | Admitting: Urology

## 2018-01-28 DIAGNOSIS — K4031 Unilateral inguinal hernia, with obstruction, without gangrene, recurrent: Secondary | ICD-10-CM | POA: Diagnosis not present

## 2018-01-28 DIAGNOSIS — C61 Malignant neoplasm of prostate: Secondary | ICD-10-CM | POA: Insufficient documentation

## 2018-01-28 HISTORY — DX: Malignant (primary) neoplasm, unspecified: C80.1

## 2018-01-28 LAB — POCT I-STAT CREATININE: Creatinine, Ser: 1.2 mg/dL (ref 0.61–1.24)

## 2018-01-28 MED ORDER — TECHNETIUM TC 99M MEDRONATE IV KIT
20.0000 | PACK | Freq: Once | INTRAVENOUS | Status: AC | PRN
  Administered 2018-01-28: 22.93 via INTRAVENOUS

## 2018-01-28 MED ORDER — IOPAMIDOL (ISOVUE-300) INJECTION 61%
100.0000 mL | Freq: Once | INTRAVENOUS | Status: AC | PRN
  Administered 2018-01-28: 100 mL via INTRAVENOUS

## 2018-01-31 ENCOUNTER — Other Ambulatory Visit: Payer: Self-pay | Admitting: Urology

## 2018-01-31 ENCOUNTER — Emergency Department
Admission: EM | Admit: 2018-01-31 | Discharge: 2018-01-31 | Disposition: A | Discharge status: Home / Self Care | Payer: Medicare HMO | Attending: Emergency Medicine | Admitting: Emergency Medicine

## 2018-01-31 ENCOUNTER — Other Ambulatory Visit: Payer: Self-pay

## 2018-01-31 DIAGNOSIS — C61 Malignant neoplasm of prostate: Secondary | ICD-10-CM

## 2018-01-31 DIAGNOSIS — Z79899 Other long term (current) drug therapy: Secondary | ICD-10-CM | POA: Diagnosis not present

## 2018-01-31 DIAGNOSIS — Z87891 Personal history of nicotine dependence: Secondary | ICD-10-CM | POA: Diagnosis not present

## 2018-01-31 DIAGNOSIS — R569 Unspecified convulsions: Secondary | ICD-10-CM

## 2018-01-31 DIAGNOSIS — G40909 Epilepsy, unspecified, not intractable, without status epilepticus: Secondary | ICD-10-CM | POA: Diagnosis not present

## 2018-01-31 DIAGNOSIS — F0390 Unspecified dementia without behavioral disturbance: Secondary | ICD-10-CM | POA: Diagnosis not present

## 2018-01-31 DIAGNOSIS — I10 Essential (primary) hypertension: Secondary | ICD-10-CM | POA: Insufficient documentation

## 2018-01-31 LAB — CBC WITH DIFFERENTIAL/PLATELET
Abs Immature Granulocytes: 0.02 10*3/uL (ref 0.00–0.07)
Basophils Absolute: 0 10*3/uL (ref 0.0–0.1)
Basophils Relative: 1 %
Eosinophils Absolute: 0.1 10*3/uL (ref 0.0–0.5)
Eosinophils Relative: 2 %
HEMATOCRIT: 30.8 % — AB (ref 39.0–52.0)
Hemoglobin: 10.4 g/dL — ABNORMAL LOW (ref 13.0–17.0)
Immature Granulocytes: 0 %
Lymphocytes Relative: 15 %
Lymphs Abs: 0.8 10*3/uL (ref 0.7–4.0)
MCH: 29.3 pg (ref 26.0–34.0)
MCHC: 33.8 g/dL (ref 30.0–36.0)
MCV: 86.8 fL (ref 80.0–100.0)
MONO ABS: 0.4 10*3/uL (ref 0.1–1.0)
Monocytes Relative: 8 %
Neutro Abs: 4.1 10*3/uL (ref 1.7–7.7)
Neutrophils Relative %: 74 %
Platelets: 259 10*3/uL (ref 150–400)
RBC: 3.55 MIL/uL — ABNORMAL LOW (ref 4.22–5.81)
RDW: 14 % (ref 11.5–15.5)
WBC: 5.5 10*3/uL (ref 4.0–10.5)
nRBC: 0 % (ref 0.0–0.2)

## 2018-01-31 LAB — COMPREHENSIVE METABOLIC PANEL
ALK PHOS: 52 U/L (ref 38–126)
ALT: 10 U/L (ref 0–44)
AST: 18 U/L (ref 15–41)
Albumin: 4 g/dL (ref 3.5–5.0)
Anion gap: 8 (ref 5–15)
BUN: 16 mg/dL (ref 8–23)
CALCIUM: 8.7 mg/dL — AB (ref 8.9–10.3)
CO2: 24 mmol/L (ref 22–32)
Chloride: 106 mmol/L (ref 98–111)
Creatinine, Ser: 1.32 mg/dL — ABNORMAL HIGH (ref 0.61–1.24)
GFR calc Af Amer: >60 mL/min (ref >60)
GFR calc non Af Amer: 54 mL/min — ABNORMAL LOW (ref >60)
Glucose, Bld: 114 mg/dL — ABNORMAL HIGH (ref 70–99)
Potassium: 3.8 mmol/L (ref 3.5–5.1)
Sodium: 138 mmol/L (ref 135–145)
TOTAL PROTEIN: 7 g/dL (ref 6.5–8.1)
Total Bilirubin: 0.3 mg/dL (ref 0.3–1.2)

## 2018-01-31 NOTE — ED Triage Notes (Signed)
Pt had reported seizure 1 hour ago - last seizure several months ago - pt takes Keppra - ems was called and pt refused transport - caregiver decided to bring to ED for eval

## 2018-01-31 NOTE — ED Provider Notes (Signed)
[facility] Emergency Department Provider Note   ____________________________________________   First MD Initiated Contact with Patient 01/31/18 1704     (approximate)  I have reviewed the triage vital signs and the nursing notes.   HISTORY  Chief Complaint Seizures    HPI Jeremy [name] is a 72 y.o. male who has a history of seizures.  His last seizure was several months ago.  Usually has a more than 3 or 4 months apart.  He is taking Keppra 500 in the morning and 750 in the evening.  He did not miss any doses.  He feels fine now.  Past Medical History:  Diagnosis Date  . Cancer ([place])   . Dementia ([place])   . Hypertension   . Seizures [facility])     Patient Active Problem List   Diagnosis Date Noted  . 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 Dementia with behavioral disturbance ([place]) 11/02/2015  . Hypertension 11/02/2015  . Seizures ([place]) 11/02/2015  . Incarcerated hernia   . Crack cocaine use 12/19/2013  . Chronic tension-type headache, not intractable 08/29/2013  . Memory loss 08/29/2013  . Numbness 08/29/2013    Past Surgical History:  Procedure Laterality Date  . none    . PROSTATE BIOPSY N/A 12/15/2017   Procedure: BIOPSY TRANSRECTAL ULTRASONIC PROSTATE (TUBP);  Surgeon: [name], MD;  Location: [facility] ORS;  Service: Urology;  Laterality: N/A;    Prior to Admission medications   Medication Sig Start Date End Date Taking? Authorizing Provider  acetaminophen (TYLENOL) 325 MG tablet Take 650 mg by mouth every 4 (four) hours as needed for moderate pain, fever or headache.    [provider]  alum & mag hydroxide-simeth (GERI-LANTA) 200-200-20 MG/5ML suspension Take 30 mLs by mouth daily as needed for indigestion or heartburn.    [provider]  bismuth subsalicylate (PEPTO BISMOL) 262 MG/15ML suspension Take 15 mLs by mouth every 4 (four) hours as needed (for nausea).    [provider]  guaiFENesin  (ROBITUSSIN) 100 MG/5ML liquid Take 200 mg by mouth every 6 (six) hours as needed for cough.    [provider]  levETIRAcetam (KEPPRA) 750 MG tablet  01/05/18   [provider]  loperamide (IMODIUM) 2 MG capsule Take 2 mg by mouth 4 (four) times daily as needed for diarrhea or loose stools.    [provider]  losartan-hydrochlorothiazide (HYZAAR) 100-25 MG tablet Take 1 tablet by mouth daily. 03/17/17 03/17/18  Schaevitz, Randall An, MD  magnesium hydroxide (MILK OF MAGNESIA) 400 MG/5ML suspension Take 30 mLs by mouth daily as needed for mild constipation.    [provider]  risperiDONE (RISPERDAL) 2 MG tablet Take 1 mg by mouth 3 (three) times daily.    [provider]    Allergies Patient has no known allergies.  No family history on file.  Social History Social History   Tobacco Use  . Smoking status: Former Smoker    Years: 25.00  . Smokeless tobacco: Never Used  Substance Use Topics  . Alcohol use: Not Currently    Comment: quit etoh x 3 months ago  . Drug use: Not Currently    Types: Cocaine, IV    Comment: quit 3 months ago - x 17 year drug and alcohol use    Review of Systems  Constitutional: No fever/chills Eyes: No visual changes. ENT: No sore throat. Cardiovascular: Denies chest pain. Respiratory: Denies shortness of breath. Gastrointestinal: No abdominal pain.  No nausea, no  vomiting.  No diarrhea.  No constipation. Genitourinary: Negative for dysuria. Musculoskeletal: Negative for back pain. Skin: Negative for rash. Neurological: Negative for headaches, focal weakness   ____________________________________________   PHYSICAL EXAM:  VITAL SIGNS: ED Triage Vitals  Enc Vitals Group     BP 01/31/18 1538 118/69     Pulse Rate 01/31/18 1538 91     Resp 01/31/18 1538 16     Temp 01/31/18 1538 98.7 F (37.1 C)     Temp Source 01/31/18 1538 Oral     SpO2 01/31/18 1538 100 %     Weight 01/31/18 1539 158 lb  (71.7 kg)     Height 01/31/18 1539 6\' 1"  (1.854 m)     Head Circumference --      Peak Flow --      Pain Score 01/31/18 1538 0     Pain Loc --      Pain Edu? --      Excl. in GC? --    } Constitutional: Alert and oriented. Well appearing and in no acute distress. Eyes: Conjunctivae are normal. PER. EOMI. Head: Atraumatic. Nose: No congestion/rhinnorhea. Mouth/Throat: Mucous membranes are moist.  Oropharynx non-erythematous. Neck: No stridor.   Cardiovascular: Normal rate, regular rhythm. Grossly normal heart sounds.  Good peripheral circulation. Respiratory: Normal respiratory effort.  No retractions. Lungs CTAB. Gastrointestinal: Soft and nontender. No distention. No abdominal bruits.  Musculoskeletal: No lower extremity tenderness nor edema.  No joint effusions. Neurologic:  Normal speech and language. No gross focal neurologic deficits are appreciated.  Cranial nerves II through XII are intact although visual fields were not checked.  Patient did not report any change in visual acuity.  Cerebellar finger-nose is slow bilaterally but normal otherwise.  Motor strength is 5/5 throughout sensation appears to be intact Skin:  Skin is warm, dry and intact. No rash noted. Psychiatric: Mood and affect are normal. Speech and behavior are normal.  ____________________________________________   LABS (all labs ordered are listed, but only abnormal results are displayed)  Labs Reviewed  CBC WITH DIFFERENTIAL/PLATELET - Abnormal; Notable for the following components:      Result Value   RBC 3.55 (*)    Hemoglobin 10.4 (*)    HCT 30.8 (*)    All other components within normal limits  COMPREHENSIVE METABOLIC PANEL - Abnormal; Notable for the following components:   Glucose, Bld 114 (*)    Creatinine, Ser 1.32 (*)    Calcium 8.7 (*)    GFR calc non Af Amer 54 (*)    All other components within normal limits  LEVETIRACETAM LEVEL    ____________________________________________  EKG  ____________________________________________  RADIOLOGY  ED MD interpretation:    Official radiology report(s): No results found.  ____________________________________________   PROCEDURES  Procedure(s) performed:   Procedures  Critical Care performed:   ____________________________________________   INITIAL IMPRESSION / ASSESSMENT AND PLAN / ED COURSE Discussed with Dr. Doy Mince she recommends increasing Keppra to 750 twice daily.  Patient is otherwise doing well we will have him do that and follow-up with his neurologist.        ____________________________________________   FINAL CLINICAL IMPRESSION(S) / ED DIAGNOSES  Final diagnoses:  Seizure [facility])     ED Discharge Orders    None       Note:  This document was prepared using Dragon voice recognition software and may include unintentional dictation errors.    Nena Polio, MD 01/31/18 [phone]

## 2018-01-31 NOTE — Discharge Instructions (Addendum)
Increase Keppra to 750 mg twice a day.  Please follow-up with primary care and his neurologist or see Dr. Melrose Nakayama or Manuella Ghazi

## 2018-02-01 ENCOUNTER — Telehealth: Payer: Self-pay

## 2018-02-01 NOTE — Telephone Encounter (Signed)
-----   Message from [name], MD sent at 01/31/2018 11:48 AM EST ----- CT and bone scan showed no evidence of metastatic prostate cancer.  Recommend radiation oncology evaluation.  A referral has been entered.

## 2018-02-01 NOTE — Telephone Encounter (Signed)
Left message for patient to call office regarding results and recommendations.  CT and bone scan showed no evidence of metastatic prostate cancer. Recommend radiation oncology evaluation. A referral has been entered

## 2018-02-03 NOTE — Telephone Encounter (Signed)
LMOM for patient to return call.

## 2018-02-08 NOTE — Telephone Encounter (Signed)
He is the patient's caregiver, even thought there is not a DPR on file the patient does not understand and you will need to speak with the caregiver. The patient lives in a group home  We need to get Olivia Mackie to get a [employer]

## 2018-02-08 NOTE — Telephone Encounter (Signed)
Attempted to reach pt but someone named [place] answered, due to no DPR results were not given. Jeremy [name] states he handles pt's appt and needs to schedule pt for an appt due to enlarged testicle but pt is not complaining of any pain. Informed him that I could send message to clerical staff in regards to that. Asked that when pt is available to have him call for results.

## 2018-02-11 NOTE — Telephone Encounter (Signed)
Spoke with Jeremy [name] patient's caregiver and he states the appointment has been scheduled at the cancer center. He will have a DPR signed there to release results. He states that patient is having swelling in testicles but no pain. Requesting an appointment for evaluation. Appointment was made for patient to follow up with Dr. Bernardo Heater

## 2018-02-15 ENCOUNTER — Ambulatory Visit
Admission: RE | Admit: 2018-02-15 | Discharge: 2018-02-15 | Disposition: A | Discharge status: Home / Self Care | Payer: Medicare HMO | Source: Ambulatory Visit | Attending: Radiation Oncology | Admitting: Radiation Oncology

## 2018-02-15 ENCOUNTER — Other Ambulatory Visit: Payer: Self-pay

## 2018-02-15 ENCOUNTER — Encounter: Payer: Self-pay | Admitting: Radiation Oncology

## 2018-02-15 VITALS — BP 120/74 | HR 84 | Temp 96.1°F | Resp 16 | Wt 146.3 lb

## 2018-02-15 DIAGNOSIS — Z87891 Personal history of nicotine dependence: Secondary | ICD-10-CM | POA: Diagnosis not present

## 2018-02-15 DIAGNOSIS — F0390 Unspecified dementia without behavioral disturbance: Secondary | ICD-10-CM | POA: Diagnosis not present

## 2018-02-15 DIAGNOSIS — Z79899 Other long term (current) drug therapy: Secondary | ICD-10-CM | POA: Insufficient documentation

## 2018-02-15 DIAGNOSIS — G40909 Epilepsy, unspecified, not intractable, without status epilepticus: Secondary | ICD-10-CM | POA: Insufficient documentation

## 2018-02-15 DIAGNOSIS — C61 Malignant neoplasm of prostate: Secondary | ICD-10-CM | POA: Insufficient documentation

## 2018-02-15 DIAGNOSIS — I10 Essential (primary) hypertension: Secondary | ICD-10-CM | POA: Diagnosis not present

## 2018-02-15 NOTE — Consult Note (Signed)
NEW PATIENT EVALUATION  Name: Jeremy [name]  MRN: [id number]  Date:   02/15/2018     DOB: [birth year]   This 72 y.o. 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 Prostate Cancer    Initial consultation     DIAGNOSIS: The encounter diagnosis was Malignant neoplasm of prostate ([place]).   PREVIOUS INVESTIGATIONS:  Bone scan and CT scans reviewed Pathology reports reviewed Clinical notes reviewed  HPI: patient is a 72 year old male with dementia member of a group home who presented with an elevated PSA of 11.17. He was fairly asymptomatic he underwent transrectal ultrasound-guided biopsy showing 5 of 6 core biopsies positive for Gleason 8 mostly (4+4) adenocarcinoma. Hisprostatevolume was 38 cc.he has developed some testicular swellingsince that procedure he is expected to see Dr. Bernardo Heater about 2 weeks. He otherwise specifically denies diarrhea dysuria or any other GI/GU complaints. He had a bone scan showing no evidence to suggest metastatic disease in a CT scan of his pelvis showing no evidence of extracapsular extension or pelvic lymphadenopathy. He is referred today duration oncology for opinion.  PLANNED TREATMENT REGIMEN: I MRT radiation therapy to prostate and pelvic nodes  PAST MEDICAL HISTORY:  has a past medical history of Cancer ([place]), Dementia ([place]), Hypertension, and Seizures ([place]).    PAST SURGICAL HISTORY:  Past Surgical History:  Procedure Laterality Date  . none    . PROSTATE BIOPSY N/A 12/15/2017   Procedure: BIOPSY TRANSRECTAL ULTRASONIC PROSTATE (TUBP);  Surgeon: [name], MD;  Location: [facility] ORS;  Service: Urology;  Laterality: N/A;    FAMILY HISTORY: family history is not on file.  SOCIAL HISTORY:  reports that he has  quit smoking. He quit after 25.00 years of use. He has never used smokeless tobacco. He reports previous alcohol use. He reports previous drug use. Drugs: Cocaine and IV.  ALLERGIES: Patient has no known allergies.  MEDICATIONS:  Current Outpatient Medications  Medication Sig Dispense Refill  . acetaminophen (TYLENOL) 325 MG tablet Take 650 mg by mouth every 4 (four) hours as needed for moderate pain, fever or headache.    Marland Kitchen alum & mag hydroxide-simeth (GERI-LANTA) 200-200-20 MG/5ML suspension Take 30 mLs by mouth daily as needed for indigestion or heartburn.    . bismuth subsalicylate (PEPTO BISMOL) 262 MG/15ML suspension Take 15 mLs by mouth every 4 (four) hours as needed (for nausea).    Marland Kitchen guaiFENesin (ROBITUSSIN) 100 MG/5ML liquid Take 200 mg by mouth every 6 (six) hours as needed for cough.    . levETIRAcetam (KEPPRA) 750 MG tablet     . loperamide (IMODIUM) 2 MG capsule Take 2 mg by mouth 4 (four) times daily as needed for diarrhea or loose stools.    Marland Kitchen losartan-hydrochlorothiazide (HYZAAR) 100-25 MG tablet Take 1 tablet by mouth daily. 30 tablet 0  . magnesium hydroxide (MILK OF MAGNESIA) 400 MG/5ML suspension Take 30 mLs by mouth daily as needed for mild constipation.    . risperiDONE (RISPERDAL) 2 MG tablet Take 1 mg by mouth 3 (three) times daily.     No current facility-administered medications for this encounter.     ECOG PERFORMANCE STATUS:  0 - Asymptomatic  REVIEW OF SYSTEMS: patient does have dementia review of systems is difficult although information obtained from caregivers Patient denies any weight  loss, fatigue, weakness, fever, chills or night sweats. Patient denies any loss of vision, blurred vision. Patient denies any ringing  of the ears or hearing loss. No irregular heartbeat. Patient denies heart murmur or history of fainting. Patient denies any chest pain or pain radiating to her upper extremities. Patient denies any shortness of breath, difficulty breathing at  night, cough or hemoptysis. Patient denies any swelling in the lower legs. Patient denies any nausea vomiting, vomiting of blood, or coffee ground material in the vomitus. Patient denies any stomach pain. Patient states has had normal bowel movements no significant constipation or diarrhea. Patient denies any dysuria, hematuria or significant nocturia. Patient denies any problems walking, swelling in the joints or loss of balance. Patient denies any skin changes, loss of hair or loss of weight. Patient denies any excessive worrying or anxiety or significant depression. Patient denies any problems with insomnia. Patient denies excessive thirst, polyuria, polydipsia. Patient denies any swollen glands, patient denies easy bruising or easy bleeding. Patient denies any recent infections, allergies or URI. Patient "s visual fields have not changed significantly in recent time.    PHYSICAL EXAM: BP 120/74 (BP Location: Left Arm, Patient Position: Sitting)   Pulse 84   Temp (!) 96.1 F (35.6 C) (Tympanic)   Resp 16   Wt 146 lb 4.4 oz (66.3 kg)   BMI 19.30 kg/m  Patient does have significant testicular swelling.Well-developed well-nourished patient in NAD. HEENT reveals PERLA, EOMI, discs not visualized.  Oral cavity is clear. No oral mucosal lesions are identified. Neck is clear without evidence of cervical or supraclavicular adenopathy. Lungs are clear to A&P. Cardiac examination is essentially unremarkable with regular rate and rhythm without murmur rub or thrill. Abdomen is benign with no organomegaly or masses noted. Motor sensory and DTR levels are equal and symmetric in the upper and lower extremities. Cranial nerves II through XII are grossly intact. Proprioception is intact. No peripheral adenopathy or edema is identified. No motor or sensory levels are noted. Crude visual fields are within normal range.  LABORATORY DATA: pathology reports reviewed    RADIOLOGY RESULTS:CT scans of abdomen and pelvis  and bone scan reviewed and compatible with the above-stated findings   IMPRESSION: stage IIB Gleason 8 (4+4) adenocarcinoma the prostate in 72 year old male with dementia member member of the group home.  PLAN: at this time based on the patient's [facility] nomogram showing an 81% incidence of extracapsular extensiona 30% chance of lymph node involvement plus his social situation I would advocate for I MRT radiation therapy to his prostate and pelvic nodes. We treated his prostate 8000 cGy and dose painting his pelvic lymph nodes to 5400 cGy using I MRT technique. Risks and benefits of treatment including increased lower urinary tract symptoms diarrhea fatigue alteration of blood counts skin reaction all were described in detail to the patient. I have personally set up and ordered CT simulation in the next week or so. I believe patient would also benefit from androgen deprivation therapy and have ordered Lupron injection in Dr. Bernardo Heater office. Patient seems to comprehend her treatment plan well. We certainly will get a better idea about his ability to tolerate treatments once we perform simulation although I do believe the patient is capable of undergoing treatment.  I would like to take this opportunity to thank you for allowing me to participate in the care of your patient.Noreene Filbert, MD

## 2018-02-24 ENCOUNTER — Ambulatory Visit
Admission: RE | Admit: 2018-02-24 | Discharge: 2018-02-24 | Disposition: A | Discharge status: Home / Self Care | Payer: Medicare HMO | Source: Ambulatory Visit | Attending: Radiation Oncology | Admitting: Radiation Oncology

## 2018-02-24 DIAGNOSIS — C61 Malignant neoplasm of prostate: Secondary | ICD-10-CM | POA: Diagnosis not present

## 2018-02-24 DIAGNOSIS — Z510 Encounter for antineoplastic radiation therapy: Secondary | ICD-10-CM | POA: Insufficient documentation

## 2018-02-26 DIAGNOSIS — Z510 Encounter for antineoplastic radiation therapy: Secondary | ICD-10-CM | POA: Diagnosis not present

## 2018-03-01 ENCOUNTER — Encounter: Payer: Self-pay | Admitting: Urology

## 2018-03-01 ENCOUNTER — Ambulatory Visit: Payer: Self-pay | Admitting: Urology

## 2018-03-04 ENCOUNTER — Telehealth: Payer: Self-pay | Admitting: Urology

## 2018-03-04 NOTE — Telephone Encounter (Signed)
Pt's caregiver Madelynn Done called, left vm and wants someone to call back. He did not say why. He can be reached at [place]

## 2018-03-05 ENCOUNTER — Other Ambulatory Visit: Payer: Self-pay | Admitting: *Deleted

## 2018-03-05 DIAGNOSIS — C61 Malignant neoplasm of prostate: Secondary | ICD-10-CM

## 2018-03-08 ENCOUNTER — Ambulatory Visit: Payer: Medicare HMO

## 2018-03-08 NOTE — Telephone Encounter (Signed)
Spoke with Madelynn Done and follow up appointment scheduled.

## 2018-03-09 ENCOUNTER — Ambulatory Visit
Admission: RE | Admit: 2018-03-09 | Discharge: 2018-03-09 | Disposition: A | Discharge status: Home / Self Care | Payer: Medicare HMO | Source: Ambulatory Visit | Attending: Radiation Oncology | Admitting: Radiation Oncology

## 2018-03-09 ENCOUNTER — Ambulatory Visit: Payer: Medicare HMO

## 2018-03-10 ENCOUNTER — Ambulatory Visit (INDEPENDENT_AMBULATORY_CARE_PROVIDER_SITE_OTHER): Payer: Medicare HMO | Admitting: Urology

## 2018-03-10 ENCOUNTER — Ambulatory Visit: Payer: Medicare HMO

## 2018-03-10 ENCOUNTER — Ambulatory Visit
Admission: RE | Admit: 2018-03-10 | Discharge: 2018-03-10 | Disposition: A | Discharge status: Home / Self Care | Payer: Medicare HMO | Source: Ambulatory Visit | Attending: Radiation Oncology | Admitting: Radiation Oncology

## 2018-03-10 VITALS — BP 100/62 | HR 79 | Ht 66.0 in | Wt 141.2 lb

## 2018-03-10 DIAGNOSIS — K4030 Unilateral inguinal hernia, with obstruction, without gangrene, not specified as recurrent: Secondary | ICD-10-CM | POA: Diagnosis not present

## 2018-03-10 DIAGNOSIS — Z510 Encounter for antineoplastic radiation therapy: Secondary | ICD-10-CM | POA: Diagnosis not present

## 2018-03-11 ENCOUNTER — Ambulatory Visit
Admission: RE | Admit: 2018-03-11 | Discharge: 2018-03-11 | Disposition: A | Discharge status: Home / Self Care | Payer: Medicare HMO | Source: Ambulatory Visit | Attending: Radiation Oncology | Admitting: Radiation Oncology

## 2018-03-11 ENCOUNTER — Encounter: Payer: Self-pay | Admitting: Urology

## 2018-03-11 ENCOUNTER — Ambulatory Visit: Payer: Medicare HMO

## 2018-03-11 DIAGNOSIS — Z510 Encounter for antineoplastic radiation therapy: Secondary | ICD-10-CM | POA: Diagnosis not present

## 2018-03-11 NOTE — Progress Notes (Signed)
03/10/2018 7:13 AM   Jeremy [name] 06-26-1945 [id number]  Referring provider: Idelle Crouch, MD [facility] [place], [place] [address]  Chief Complaint  Patient presents with  . Groin Swelling    HPI: 73 year old male recently seen for an elevated PSA and diagnosed with high risk adenocarcinoma the prostate.  He is getting ready to start radiation therapy in the [place].  He lives in a group home and presents today with his caregiver who states he recently had onset of swelling of the right groin/right hemiscrotum.  He denies pain.  Denies bothersome lower urinary tract symptoms.   PMH: Past Medical History:  Diagnosis Date  . Cancer ([place])   . Dementia ([place])   . Hypertension   . Seizures [facility])     Surgical History: Past Surgical History:  Procedure Laterality Date  . none    . PROSTATE BIOPSY N/A 12/15/2017   Procedure: BIOPSY TRANSRECTAL ULTRASONIC PROSTATE (TUBP);  Surgeon: [name], MD;  Location: [facility] ORS;  Service: Urology;  Laterality: N/A;    Home Medications:  Allergies as of 03/10/2018   No Known Allergies     Medication List       Accurate as of March 10, 2018 11:59 PM. Always use your most recent med list.        acetaminophen 325 MG tablet Commonly known as:  TYLENOL Take 650 mg by mouth every 4 (four) hours as needed for moderate pain, fever or headache.   bismuth subsalicylate 191 YN/82NF suspension Commonly known as:  PEPTO BISMOL Take 15 mLs by mouth every 4 (four) hours as needed (for nausea).   GERI-LANTA 200-200-20 MG/5ML suspension Generic drug:  alum & mag hydroxide-simeth Take 30 mLs by mouth daily as needed for indigestion or heartburn.   guaiFENesin 100 MG/5ML liquid Commonly known as:  ROBITUSSIN Take 200 mg by mouth every 6 (six) hours as needed for cough.   lamoTRIgine 25 MG tablet Commonly known as:  LAMICTAL   levETIRAcetam 750 MG tablet Commonly known as:   KEPPRA   loperamide 2 MG capsule Commonly known as:  IMODIUM Take 2 mg by mouth 4 (four) times daily as needed for diarrhea or loose stools.   losartan-hydrochlorothiazide 100-25 MG tablet Commonly known as:  HYZAAR Take 1 tablet by mouth daily.   magnesium hydroxide 400 MG/5ML suspension Commonly known as:  MILK OF MAGNESIA Take 30 mLs by mouth daily as needed for mild constipation.   risperiDONE 2 MG tablet Commonly known as:  RISPERDAL Take 1 mg by mouth 3 (three) times daily.       Allergies: No Known Allergies  Family History: No family history on file.  Social History:  reports that he has quit smoking. He quit after 25.00 years of use. He has never used smokeless tobacco. He reports previous alcohol use. He reports previous drug use. Drugs: Cocaine and IV.  ROS: UROLOGY Frequent Urination?: No Hard to postpone urination?: No Burning/pain with urination?: No Get up at night to urinate?: No Leakage of urine?: No Urine stream starts and stops?: No Trouble starting stream?: No Do you have to strain to urinate?: No Blood in urine?: No Urinary tract infection?: No Sexually transmitted disease?: No Injury to kidneys or bladder?: No Painful intercourse?: No Weak stream?: No Erection problems?: No Penile pain?: No  Gastrointestinal Nausea?: No Vomiting?: No Indigestion/heartburn?: No Diarrhea?: No Constipation?: No  Constitutional Fever: No Night sweats?: No Weight loss?: No Fatigue?: No  Skin Skin rash/lesions?: No Itching?: No  Eyes Blurred vision?: No Double vision?: No  Ears/Nose/Throat Sore throat?: No Sinus problems?: No  Hematologic/Lymphatic Swollen glands?: No Easy bruising?: No  Cardiovascular Leg swelling?: No Chest pain?: No  Respiratory Cough?: No Shortness of breath?: No  Endocrine Excessive thirst?: No  Musculoskeletal Back pain?: No Joint pain?: No  Neurological Headaches?: No Dizziness?:  No  Psychologic Depression?: No Anxiety?: No  Physical Exam: BP 100/62 (BP Location: Left Arm, Patient Position: Sitting, Cuff Size: Normal)   Pulse 79   Ht 5\' 6"  (1.676 m)   Wt 141 lb 3.2 oz (64 kg)   BMI 22.79 kg/m   Constitutional:  Alert, No acute distress. HEENT: [place] AT, moist mucus membranes.  Trachea midline, no masses. Cardiovascular: No clubbing, cyanosis, or edema. Respiratory: Normal respiratory effort, no increased work of breathing. GI: Abdomen is soft, nontender, nondistended, no abdominal masses GU: No CVA tenderness.  Marked swelling of the right hemiscrotum/right inguinal region consistent with hernia. Lymph: No cervical or inguinal lymphadenopathy. Skin: No rashes, bruises or suspicious lesions. Neurologic: Grossly intact, no focal deficits, moving all 4 extremities.   Assessment & Plan:   73 year old male with a large right inguinal hernia extending into the scrotum which is asymptomatic.  He did have a staging CT of the abdomen pelvis for prostate cancer which did show a large hernia with loops of bowel extending into the scrotum.  Would recommend a general surgery evaluation.  [name], [place] [address] [place], [place] [address] [phone]

## 2018-03-12 ENCOUNTER — Ambulatory Visit
Admission: RE | Admit: 2018-03-12 | Discharge: 2018-03-12 | Disposition: A | Discharge status: Home / Self Care | Payer: Medicare HMO | Source: Ambulatory Visit | Attending: Radiation Oncology | Admitting: Radiation Oncology

## 2018-03-12 ENCOUNTER — Telehealth: Payer: Self-pay

## 2018-03-12 ENCOUNTER — Ambulatory Visit: Payer: Medicare HMO

## 2018-03-12 DIAGNOSIS — Z510 Encounter for antineoplastic radiation therapy: Secondary | ICD-10-CM | POA: Diagnosis not present

## 2018-03-12 NOTE — Telephone Encounter (Signed)
Called pt's caregiver, informed him of the information below. Caregiver gave verbal understanding and states that [facility] has contacted them for an appt.

## 2018-03-12 NOTE — Telephone Encounter (Signed)
-----   Message from [name], MD sent at 03/11/2018  7:17 AM EST ----- Regarding: Scrotal ultrasound I saw patient yesterday for scrotal swelling and was going to schedule a scrotal ultrasound however he did have a CT which shows a large right inguinal hernia extending into the scrotum.  He does not need the scrotal ultrasound.  He lives in a group home.  Please contact his caregiver to let him know he does not need the ultrasound and will put in a referral to general surgery.  Thanks

## 2018-03-15 ENCOUNTER — Ambulatory Visit: Payer: Medicare HMO

## 2018-03-16 ENCOUNTER — Ambulatory Visit: Payer: Medicare HMO

## 2018-03-16 ENCOUNTER — Ambulatory Visit
Admission: RE | Admit: 2018-03-16 | Discharge: 2018-03-16 | Disposition: A | Discharge status: Home / Self Care | Payer: Medicare HMO | Source: Ambulatory Visit | Attending: Radiation Oncology | Admitting: Radiation Oncology

## 2018-03-16 DIAGNOSIS — Z510 Encounter for antineoplastic radiation therapy: Secondary | ICD-10-CM | POA: Diagnosis not present

## 2018-03-17 ENCOUNTER — Ambulatory Visit
Admission: RE | Admit: 2018-03-17 | Discharge: 2018-03-17 | Disposition: A | Discharge status: Home / Self Care | Payer: Medicare HMO | Source: Ambulatory Visit | Attending: Radiation Oncology | Admitting: Radiation Oncology

## 2018-03-17 ENCOUNTER — Ambulatory Visit: Payer: Medicare HMO

## 2018-03-17 DIAGNOSIS — Z510 Encounter for antineoplastic radiation therapy: Secondary | ICD-10-CM | POA: Diagnosis not present

## 2018-03-18 ENCOUNTER — Ambulatory Visit
Admission: RE | Admit: 2018-03-18 | Discharge: 2018-03-18 | Disposition: A | Discharge status: Home / Self Care | Payer: Medicare HMO | Source: Ambulatory Visit | Attending: Radiation Oncology | Admitting: Radiation Oncology

## 2018-03-18 ENCOUNTER — Ambulatory Visit: Payer: Medicare HMO

## 2018-03-18 DIAGNOSIS — Z510 Encounter for antineoplastic radiation therapy: Secondary | ICD-10-CM | POA: Diagnosis not present

## 2018-03-19 ENCOUNTER — Ambulatory Visit: Payer: Medicare HMO

## 2018-03-19 ENCOUNTER — Ambulatory Visit
Admission: RE | Admit: 2018-03-19 | Discharge: 2018-03-19 | Disposition: A | Discharge status: Home / Self Care | Payer: Medicare HMO | Source: Ambulatory Visit | Attending: Radiation Oncology | Admitting: Radiation Oncology

## 2018-03-19 DIAGNOSIS — Z510 Encounter for antineoplastic radiation therapy: Secondary | ICD-10-CM | POA: Diagnosis not present

## 2018-03-22 ENCOUNTER — Ambulatory Visit
Admission: RE | Admit: 2018-03-22 | Discharge: 2018-03-22 | Disposition: A | Discharge status: Home / Self Care | Payer: Medicare HMO | Source: Ambulatory Visit | Attending: Radiation Oncology | Admitting: Radiation Oncology

## 2018-03-22 ENCOUNTER — Ambulatory Visit: Payer: Medicare HMO

## 2018-03-22 DIAGNOSIS — C61 Malignant neoplasm of prostate: Secondary | ICD-10-CM | POA: Insufficient documentation

## 2018-03-22 DIAGNOSIS — Z510 Encounter for antineoplastic radiation therapy: Secondary | ICD-10-CM | POA: Diagnosis not present

## 2018-03-23 ENCOUNTER — Ambulatory Visit
Admission: RE | Admit: 2018-03-23 | Discharge: 2018-03-23 | Disposition: A | Discharge status: Home / Self Care | Payer: Medicare HMO | Source: Ambulatory Visit | Attending: Radiation Oncology | Admitting: Radiation Oncology

## 2018-03-23 ENCOUNTER — Inpatient Hospital Stay: Payer: Medicare HMO | Attending: Radiation Oncology

## 2018-03-23 ENCOUNTER — Ambulatory Visit: Payer: Medicare HMO

## 2018-03-23 DIAGNOSIS — Z510 Encounter for antineoplastic radiation therapy: Secondary | ICD-10-CM | POA: Diagnosis not present

## 2018-03-23 DIAGNOSIS — C61 Malignant neoplasm of prostate: Secondary | ICD-10-CM | POA: Insufficient documentation

## 2018-03-24 ENCOUNTER — Encounter: Payer: Self-pay | Admitting: Surgery

## 2018-03-24 ENCOUNTER — Ambulatory Visit
Admission: RE | Admit: 2018-03-24 | Discharge: 2018-03-24 | Disposition: A | Discharge status: Home / Self Care | Payer: Medicare HMO | Source: Ambulatory Visit | Attending: Radiation Oncology | Admitting: Radiation Oncology

## 2018-03-24 ENCOUNTER — Other Ambulatory Visit: Payer: Self-pay

## 2018-03-24 ENCOUNTER — Ambulatory Visit: Payer: Medicare HMO

## 2018-03-24 ENCOUNTER — Ambulatory Visit (INDEPENDENT_AMBULATORY_CARE_PROVIDER_SITE_OTHER): Payer: Medicare HMO | Admitting: Surgery

## 2018-03-24 VITALS — BP 111/64 | HR 75 | Temp 97.3°F | Resp 18 | Ht 66.0 in | Wt 146.8 lb

## 2018-03-24 DIAGNOSIS — Z510 Encounter for antineoplastic radiation therapy: Secondary | ICD-10-CM | POA: Diagnosis not present

## 2018-03-24 DIAGNOSIS — K4090 Unilateral inguinal hernia, without obstruction or gangrene, not specified as recurrent: Secondary | ICD-10-CM

## 2018-03-24 NOTE — Patient Instructions (Addendum)
The patient is aware to call back for any questions or new concerns.  Follow up after radiation completed to schedule surgery  Inguinal Hernia, Adult An inguinal hernia is when fat or your intestines push through a weak spot in a muscle where your leg meets your lower belly (groin). This causes a rounded lump (bulge). This kind of hernia could also be:  In your scrotum, if you are male.  In folds of skin around your vagina, if you are male. There are three types of inguinal hernias. These include:  Hernias that can be pushed back into the belly (are reducible). This type rarely causes pain.  Hernias that cannot be pushed back into the belly (are incarcerated).  Hernias that cannot be pushed back into the belly and lose their blood supply (are strangulated). This type needs emergency surgery. If you do not have symptoms, you may not need treatment. If you have symptoms or a large hernia, you may need surgery. Follow these instructions at home: Lifestyle  Do these things if told by your doctor so you do not have trouble pooping (constipation): ? Drink enough fluid to keep your pee (urine) pale yellow. ? Eat foods that have a lot of fiber. These include fresh fruits and vegetables, whole grains, and beans. ? Limit foods that are high in fat and processed sugars. These include foods that are fried or sweet. ? Take medicine for trouble pooping.  Avoid lifting heavy objects.  Avoid standing for long amounts of time.  Do not use any products that contain nicotine or tobacco. These include cigarettes and e-cigarettes. If you need help quitting, ask your doctor.  Stay at a healthy weight. General instructions  You may try to push your hernia in by very gently pressing on it when you are lying down. Do not try to force the bulge back in if it will not push in easily.  Watch your hernia for any changes in shape, size, or color. Tell your doctor if you see any changes.  Take  over-the-counter and prescription medicines only as told by your doctor.  Keep all follow-up visits as told by your doctor. This is important. Contact a doctor if:  You have a fever.  You have new symptoms.  Your symptoms get worse. Get help right away if:  The area where your leg meets your lower belly has: ? Pain that gets worse suddenly. ? A bulge that gets bigger suddenly, and it does not get smaller after that. ? A bulge that turns red or purple. ? A bulge that is painful when you touch it.  You are a man, and your scrotum: ? Suddenly feels painful. ? Suddenly changes in size.  You cannot push the hernia in by very gently pressing on it when you are lying down. Do not try to force the bulge back in if it will not push in easily.  You feel sick to your stomach (nauseous), and that feeling does not go away.  You throw up (vomit), and that keeps happening.  You have a fast heartbeat.  You cannot poop (have a bowel movement) or pass gas. These symptoms may be an emergency. Do not wait to see if the symptoms will go away. Get medical help right away. Call your local emergency services (911 in the [place] Summary  An inguinal hernia is when fat or your intestines push through a weak spot in a muscle where your leg meets your lower belly (groin). This causes a rounded  lump (bulge).  If you do not have symptoms, you may not need treatment. If you have symptoms or a large hernia, you may need surgery.  Avoid lifting heavy objects. Also avoid standing for long amounts of time.  Do not try to force the bulge back in if it will not push in easily. This information is not intended to replace advice given to you by your health care provider. Make sure you discuss any questions you have with your health care provider. Document Released: 03/06/2006 Document Revised: 03/07/2017 Document Reviewed: 11/05/2016 Elsevier Interactive Patient Education  2019 [employer].

## 2018-03-24 NOTE — Progress Notes (Signed)
Patient ID: Jeremy [name], male   DOB: Dec 01, 1945, 73 y.o.   MRN: [id number]  HPI Jeremy [name] is a 73 y.o. male seen for a large right inguinal hernia.  Was referred by Dr. Bernardo [name] from urology.  Jeremy [name] has been recently diagnosed with prostate cancer and currently it is undergoing radiation therapy.  Last month or so he started having increased swelling on his right inguinal area.  He reports some discomfort but no true pain.  No evidence of bowel obstruction no evidence of incarceration or strangulation.  Does have a history of seizure disorder and lives in a group home.  He is able to walk without assistance he is able to eat without assistance but does require assistant with fine motor skills secondary to tremors and some neurodegenerative disorder.  Did have a CT scan on December 2019 that I have personally reviewed showing evidence of a large right inguinal hernia containing multiple loops of small bowel and cecum.  Also an umbilical hernia.  No other acute intra-abdominal abnormalities. Have a questionable history of dementia with some neurodegenerative cortical disease.  He is currently fairly functional.  He denies any fevers any chills no shortness of breath.  No weight loss. See showed a hemoglobin of 10 normal platelets.  CMP was normal except his creatinine was 1.3 that is at baseline.  HPI  Past Medical History:  Diagnosis Date  . Cancer ([place]) 2019   prostate /radiation  . Dementia ([place])   . Hypertension   . Seizures ([place])     Past Surgical History:  Procedure Laterality Date  . none    . PROSTATE BIOPSY N/A 12/15/2017   Procedure: BIOPSY TRANSRECTAL ULTRASONIC PROSTATE (TUBP);  Surgeon: Jeremy Sons, MD;  Location: [facility] ORS;  Service: Urology;  Laterality: N/A;    No family history on file.  Social History Social History   Tobacco Use  . Smoking status: Former Smoker    Years: 25.00  . Smokeless tobacco: Never Used  Substance Use Topics  .  Alcohol use: Not Currently    Comment: quit etoh x 3 months ago  . Drug use: Not Currently    Types: Cocaine, IV    Comment: quit 3 months ago - x 17 year drug and alcohol use    No Known Allergies  Current Outpatient Medications  Medication Sig Dispense Refill  . acetaminophen (TYLENOL) 325 MG tablet Take 650 mg by mouth every 4 (four) hours as needed for moderate pain, fever or headache.    Marland Kitchen alum & mag hydroxide-simeth (GERI-LANTA) 200-200-20 MG/5ML suspension Take 30 mLs by mouth daily as needed for indigestion or heartburn.    . bismuth subsalicylate (PEPTO BISMOL) 262 MG/15ML suspension Take 15 mLs by mouth every 4 (four) hours as needed (for nausea).    Marland Kitchen guaiFENesin (ROBITUSSIN) 100 MG/5ML liquid Take 200 mg by mouth every 6 (six) hours as needed for cough.    . lamoTRIgine (LAMICTAL) 25 MG tablet     . levETIRAcetam (KEPPRA) 750 MG tablet     . loperamide (IMODIUM) 2 MG capsule Take 2 mg by mouth 4 (four) times daily as needed for diarrhea or loose stools.    . magnesium hydroxide (MILK OF MAGNESIA) 400 MG/5ML suspension Take 30 mLs by mouth daily as needed for mild constipation.    . risperiDONE (RISPERDAL) 2 MG tablet Take 1 mg by mouth 3 (three) times daily.    Marland Kitchen losartan-hydrochlorothiazide (HYZAAR) 100-25 MG tablet Take 1 tablet by mouth  daily. 30 tablet 0   No current facility-administered medications for this visit.      Review of Systems Full ROS  was asked and was negative except for the information on the HPI  Physical Exam Blood pressure 111/64, pulse 75, temperature (!) 97.3 F (36.3 C), temperature source Temporal, resp. rate 18, height 5\' 6"  (1.676 m), weight 146 lb 12.8 oz (66.6 kg), SpO2 96 %. CONSTITUTIONAL: NAD, chronically ill EYES: Pupils are equal, round, and reactive to light, Sclera are non-icteric. EARS, NOSE, MOUTH AND THROAT: The oropharynx is clear. The oral mucosa is pink and moist. Hearing is intact to voice. LYMPH NODES:  Lymph nodes in the  neck are normal. RESPIRATORY:  Lungs are clear. There is normal respiratory effort, with equal breath sounds bilaterally, and without pathologic use of accessory muscles. CARDIOVASCULAR: Heart is regular without murmurs, gallops, or rubs. GI: The abdomen is  soft, nontender, and nondistended.  There is a very large right inguinal hernia that goes to the scrotum with bowel within the sac.  It reduces easily.  There is mild discomfort.   There Is also a 2 cm reducible umbilical hernia GU: Rectal deferred.   MUSCULOSKELETAL: Normal muscle strength and tone. No cyanosis or edema.   SKIN: Turgor is good and there are no pathologic skin lesions or ulcers. NEUROLOGIC: Motor and sensation is grossly normal. Cranial nerves are grossly intact. PSYCH:  Oriented to person, place and time. Affect is normal.  Data Reviewed  I have personally reviewed the patient's imaging, laboratory findings and medical records.    Assessment/Plan 73 year old male with recently diagnosed prostate cancer currently undergoing radiation therapy now with a symptomatic large right inguinal hernia as well as an umbilical hernia. Like to wait until he completes his radiation appendectomy before entertaining any surgical intervention.  I will see him back in 6 to 8 weeks for another follow-up appointment and at that time if he has recovered well and is doing well I will offer him a robotic assisted knee repair as well as an umbilical hernia. Currently his hernia is reducible and there is no need for emergent surgical intervention.  An extensive discussion with the patient and also the caregivers from the group home.  Jeremy Hamman, MD FACS General Surgeon 03/24/2018, 9:48 AM

## 2018-03-25 ENCOUNTER — Ambulatory Visit
Admission: RE | Admit: 2018-03-25 | Discharge: 2018-03-25 | Disposition: A | Discharge status: Home / Self Care | Payer: Medicare HMO | Source: Ambulatory Visit | Attending: Radiation Oncology | Admitting: Radiation Oncology

## 2018-03-25 ENCOUNTER — Ambulatory Visit: Payer: Medicare HMO

## 2018-03-25 DIAGNOSIS — Z510 Encounter for antineoplastic radiation therapy: Secondary | ICD-10-CM | POA: Diagnosis not present

## 2018-03-26 ENCOUNTER — Ambulatory Visit
Admission: RE | Admit: 2018-03-26 | Discharge: 2018-03-26 | Disposition: A | Discharge status: Home / Self Care | Payer: Medicare HMO | Source: Ambulatory Visit | Attending: Radiation Oncology | Admitting: Radiation Oncology

## 2018-03-26 ENCOUNTER — Ambulatory Visit: Payer: Medicare HMO

## 2018-03-26 DIAGNOSIS — Z510 Encounter for antineoplastic radiation therapy: Secondary | ICD-10-CM | POA: Diagnosis not present

## 2018-03-27 NOTE — Progress Notes (Incomplete)
03/29/2018  1:33 PM   Jeremy [name] 1945/04/19 [id number]  Referring provider: Idelle Crouch, MD [facility] [place], [place] [address]  No chief complaint on file.   HPI: Jeremy [name] is a 73 y.o. male with prostate cancer and a large right inguinal hernia who presents today for care and management of *** right testicular swelling and to prepare for Lupron injections.  He lives in a group home.  ***  {General Surgery is currently intending to wait until he is done radiation before repairing the hernia, since currently it's not painful and can wait.)  PMH: Past Medical History:  Diagnosis Date   Cancer ([place]) 2019   prostate /radiation   Dementia ([place])    Hypertension    Seizures ([place])     Surgical History: Past Surgical History:  Procedure Laterality Date   none     PROSTATE BIOPSY N/A 12/15/2017   Procedure: BIOPSY TRANSRECTAL ULTRASONIC PROSTATE (TUBP);  Surgeon: [name], MD;  Location: [facility] ORS;  Service: Urology;  Laterality: N/A;    Home Medications:  Allergies as of 03/29/2018   No Known Allergies     Medication List       Accurate as of March 27, 2018  1:33 PM. Always use your most recent med list.        acetaminophen 325 MG tablet Commonly known as:  TYLENOL Take 650 mg by mouth every 4 (four) hours as needed for moderate pain, fever or headache.   bismuth subsalicylate 841 YS/06TK suspension Commonly known as:  PEPTO BISMOL Take 15 mLs by mouth every 4 (four) hours as needed (for nausea).   GERI-LANTA 200-200-20 MG/5ML suspension Generic drug:  alum & mag hydroxide-simeth Take 30 mLs by mouth daily as needed for indigestion or heartburn.   guaiFENesin 100 MG/5ML liquid Commonly known as:  ROBITUSSIN Take 200 mg by mouth every 6 (six) hours as needed for cough.   lamoTRIgine 25 MG tablet Commonly known as:  LAMICTAL   levETIRAcetam 750 MG tablet Commonly known as:  KEPPRA     loperamide 2 MG capsule Commonly known as:  IMODIUM Take 2 mg by mouth 4 (four) times daily as needed for diarrhea or loose stools.   losartan-hydrochlorothiazide 100-25 MG tablet Commonly known as:  HYZAAR Take 1 tablet by mouth daily.   magnesium hydroxide 400 MG/5ML suspension Commonly known as:  MILK OF MAGNESIA Take 30 mLs by mouth daily as needed for mild constipation.   risperiDONE 2 MG tablet Commonly known as:  RISPERDAL Take 1 mg by mouth 3 (three) times daily.       Allergies: No Known Allergies  Family History: No family history on file.  Social History:  reports that he has quit smoking. He quit after 25.00 years of use. He has never used smokeless tobacco. He reports previous alcohol use. He reports previous drug use. Drugs: Cocaine and IV.  ROS:                                        Physical Exam: There were no vitals taken for this visit.  Constitutional:  Alert and oriented, No acute distress. HEENT: [place] AT, moist mucus membranes.  Trachea midline, no masses. Cardiovascular: No clubbing, cyanosis, or edema. Respiratory: Normal respiratory effort, no increased work of breathing. GI: Abdomen is soft, nontender, nondistended, no abdominal masses GU:  No CVA tenderness Lymph: No cervical or inguinal lymphadenopathy. Skin: No rashes, bruises or suspicious lesions. Neurologic: Grossly intact, no focal deficits, moving all 4 extremities. Psychiatric: Normal mood and affect.  Laboratory Data: Lab Results  Component Value Date   WBC 5.5 01/31/2018   HGB 10.4 (L) 01/31/2018   HCT 30.8 (L) 01/31/2018   MCV 86.8 01/31/2018   PLT 259 01/31/2018    Lab Results  Component Value Date   CREATININE 1.32 (H) 01/31/2018    No results found for: PSA  No results found for: TESTOSTERONE  No results found for: HGBA1C  Urinalysis    Component Value Date/Time   COLORURINE YELLOW (A) 07/10/2017 2151   APPEARANCEUR CLEAR (A) 07/10/2017  2151   APPEARANCEUR Hazy 08/14/2013 2304   LABSPEC 1.011 07/10/2017 2151   LABSPEC 1.010 08/14/2013 2304   PHURINE 5.0 07/10/2017 2151   GLUCOSEU NEGATIVE 07/10/2017 2151   GLUCOSEU Negative 08/14/2013 [place] 07/10/2017 2151   BILIRUBINUR NEGATIVE 07/10/2017 2151   BILIRUBINUR Negative 08/14/2013 2304   KETONESUR NEGATIVE 07/10/2017 2151   PROTEINUR NEGATIVE 07/10/2017 2151   NITRITE NEGATIVE 07/10/2017 2151   LEUKOCYTESUR NEGATIVE 07/10/2017 2151   LEUKOCYTESUR Negative 08/14/2013 2304    Lab Results  Component Value Date   BACTERIA NONE SEEN 07/10/2017    Pertinent Imaging: *** No results found for this or any previous visit. No results found for this or any previous visit. No results found for this or any previous visit. No results found for this or any previous visit. No results found for this or any previous visit. No results found for this or any previous visit. No results found for this or any previous visit. No results found for this or any previous visit.  Assessment & Plan:    @DIAGMED @  No follow-ups on file.  [facility] [address], [place], [place] [address] [phone]  I, Adele Schilder, am acting as a scribe for John Giovanni, MD.    {Add Scribe Attestation Statement}

## 2018-03-29 ENCOUNTER — Ambulatory Visit: Payer: Self-pay | Admitting: Urology

## 2018-03-29 ENCOUNTER — Ambulatory Visit
Admission: RE | Admit: 2018-03-29 | Discharge: 2018-03-29 | Disposition: A | Discharge status: Home / Self Care | Payer: Medicare HMO | Source: Ambulatory Visit | Attending: Radiation Oncology | Admitting: Radiation Oncology

## 2018-03-29 ENCOUNTER — Encounter: Payer: Self-pay | Admitting: Urology

## 2018-03-29 ENCOUNTER — Ambulatory Visit: Payer: Medicare HMO

## 2018-03-29 DIAGNOSIS — Z510 Encounter for antineoplastic radiation therapy: Secondary | ICD-10-CM | POA: Diagnosis not present

## 2018-03-30 ENCOUNTER — Ambulatory Visit
Admission: RE | Admit: 2018-03-30 | Discharge: 2018-03-30 | Disposition: A | Discharge status: Home / Self Care | Payer: Medicare HMO | Source: Ambulatory Visit | Attending: Radiation Oncology | Admitting: Radiation Oncology

## 2018-03-30 ENCOUNTER — Ambulatory Visit: Payer: Medicare HMO

## 2018-03-30 DIAGNOSIS — Z510 Encounter for antineoplastic radiation therapy: Secondary | ICD-10-CM | POA: Diagnosis not present

## 2018-03-31 ENCOUNTER — Ambulatory Visit: Payer: Medicare HMO

## 2018-03-31 ENCOUNTER — Ambulatory Visit
Admission: RE | Admit: 2018-03-31 | Discharge: 2018-03-31 | Disposition: A | Discharge status: Home / Self Care | Payer: Medicare HMO | Source: Ambulatory Visit | Attending: Radiation Oncology | Admitting: Radiation Oncology

## 2018-03-31 DIAGNOSIS — Z510 Encounter for antineoplastic radiation therapy: Secondary | ICD-10-CM | POA: Diagnosis not present

## 2018-04-01 ENCOUNTER — Ambulatory Visit
Admission: RE | Admit: 2018-04-01 | Discharge: 2018-04-01 | Disposition: A | Discharge status: Home / Self Care | Payer: Medicare HMO | Source: Ambulatory Visit | Attending: Radiation Oncology | Admitting: Radiation Oncology

## 2018-04-01 ENCOUNTER — Ambulatory Visit: Payer: Medicare HMO

## 2018-04-01 DIAGNOSIS — Z510 Encounter for antineoplastic radiation therapy: Secondary | ICD-10-CM | POA: Diagnosis not present

## 2018-04-02 ENCOUNTER — Ambulatory Visit: Payer: Medicare HMO

## 2018-04-02 ENCOUNTER — Ambulatory Visit
Admission: RE | Admit: 2018-04-02 | Discharge: 2018-04-02 | Disposition: A | Discharge status: Home / Self Care | Payer: Medicare HMO | Source: Ambulatory Visit | Attending: Radiation Oncology | Admitting: Radiation Oncology

## 2018-04-02 DIAGNOSIS — Z510 Encounter for antineoplastic radiation therapy: Secondary | ICD-10-CM | POA: Diagnosis not present

## 2018-04-04 ENCOUNTER — Ambulatory Visit: Payer: Medicare HMO

## 2018-04-05 ENCOUNTER — Ambulatory Visit: Payer: Medicare HMO

## 2018-04-05 ENCOUNTER — Ambulatory Visit
Admission: RE | Admit: 2018-04-05 | Discharge: 2018-04-05 | Disposition: A | Discharge status: Home / Self Care | Payer: Medicare HMO | Source: Ambulatory Visit | Attending: Radiation Oncology | Admitting: Radiation Oncology

## 2018-04-05 DIAGNOSIS — Z510 Encounter for antineoplastic radiation therapy: Secondary | ICD-10-CM | POA: Diagnosis not present

## 2018-04-06 ENCOUNTER — Ambulatory Visit: Payer: Medicare HMO

## 2018-04-06 ENCOUNTER — Inpatient Hospital Stay: Payer: Medicare HMO

## 2018-04-06 ENCOUNTER — Ambulatory Visit
Admission: RE | Admit: 2018-04-06 | Discharge: 2018-04-06 | Disposition: A | Discharge status: Home / Self Care | Payer: Medicare HMO | Source: Ambulatory Visit | Attending: Radiation Oncology | Admitting: Radiation Oncology

## 2018-04-06 DIAGNOSIS — C61 Malignant neoplasm of prostate: Secondary | ICD-10-CM | POA: Diagnosis not present

## 2018-04-06 DIAGNOSIS — Z510 Encounter for antineoplastic radiation therapy: Secondary | ICD-10-CM | POA: Diagnosis not present

## 2018-04-06 LAB — CBC
HCT: 29.8 % — ABNORMAL LOW (ref 39.0–52.0)
Hemoglobin: 10 g/dL — ABNORMAL LOW (ref 13.0–17.0)
MCH: 29.9 pg (ref 26.0–34.0)
MCHC: 33.6 g/dL (ref 30.0–36.0)
MCV: 89.2 fL (ref 80.0–100.0)
PLATELETS: 176 10*3/uL (ref 150–400)
RBC: 3.34 MIL/uL — ABNORMAL LOW (ref 4.22–5.81)
RDW: 14.3 % (ref 11.5–15.5)
WBC: 3.2 10*3/uL — ABNORMAL LOW (ref 4.0–10.5)
nRBC: 0 % (ref 0.0–0.2)

## 2018-04-07 ENCOUNTER — Ambulatory Visit: Payer: Medicare HMO

## 2018-04-07 ENCOUNTER — Ambulatory Visit
Admission: RE | Admit: 2018-04-07 | Discharge: 2018-04-07 | Disposition: A | Discharge status: Home / Self Care | Payer: Medicare HMO | Source: Ambulatory Visit | Attending: Radiation Oncology | Admitting: Radiation Oncology

## 2018-04-07 DIAGNOSIS — Z510 Encounter for antineoplastic radiation therapy: Secondary | ICD-10-CM | POA: Diagnosis not present

## 2018-04-08 ENCOUNTER — Ambulatory Visit: Payer: Medicare HMO

## 2018-04-08 ENCOUNTER — Ambulatory Visit
Admission: RE | Admit: 2018-04-08 | Discharge: 2018-04-08 | Disposition: A | Discharge status: Home / Self Care | Payer: Medicare HMO | Source: Ambulatory Visit | Attending: Radiation Oncology | Admitting: Radiation Oncology

## 2018-04-08 DIAGNOSIS — Z510 Encounter for antineoplastic radiation therapy: Secondary | ICD-10-CM | POA: Diagnosis not present

## 2018-04-09 ENCOUNTER — Ambulatory Visit: Payer: Medicare HMO

## 2018-04-09 ENCOUNTER — Ambulatory Visit
Admission: RE | Admit: 2018-04-09 | Discharge: 2018-04-09 | Disposition: A | Discharge status: Home / Self Care | Payer: Medicare HMO | Source: Ambulatory Visit | Attending: Radiation Oncology | Admitting: Radiation Oncology

## 2018-04-09 DIAGNOSIS — Z510 Encounter for antineoplastic radiation therapy: Secondary | ICD-10-CM | POA: Diagnosis not present

## 2018-04-12 ENCOUNTER — Ambulatory Visit: Payer: Medicare HMO

## 2018-04-12 ENCOUNTER — Ambulatory Visit
Admission: RE | Admit: 2018-04-12 | Discharge: 2018-04-12 | Disposition: A | Discharge status: Home / Self Care | Payer: Medicare HMO | Source: Ambulatory Visit | Attending: Radiation Oncology | Admitting: Radiation Oncology

## 2018-04-12 DIAGNOSIS — Z510 Encounter for antineoplastic radiation therapy: Secondary | ICD-10-CM | POA: Diagnosis not present

## 2018-04-13 ENCOUNTER — Ambulatory Visit
Admission: RE | Admit: 2018-04-13 | Discharge: 2018-04-13 | Disposition: A | Discharge status: Home / Self Care | Payer: Medicare HMO | Source: Ambulatory Visit | Attending: Radiation Oncology | Admitting: Radiation Oncology

## 2018-04-13 ENCOUNTER — Ambulatory Visit: Payer: Medicare HMO

## 2018-04-13 DIAGNOSIS — Z510 Encounter for antineoplastic radiation therapy: Secondary | ICD-10-CM | POA: Diagnosis not present

## 2018-04-14 ENCOUNTER — Ambulatory Visit: Payer: Medicare HMO

## 2018-04-14 ENCOUNTER — Ambulatory Visit
Admission: RE | Admit: 2018-04-14 | Discharge: 2018-04-14 | Disposition: A | Discharge status: Home / Self Care | Payer: Medicare HMO | Source: Ambulatory Visit | Attending: Radiation Oncology | Admitting: Radiation Oncology

## 2018-04-14 DIAGNOSIS — Z510 Encounter for antineoplastic radiation therapy: Secondary | ICD-10-CM | POA: Diagnosis not present

## 2018-04-15 ENCOUNTER — Ambulatory Visit: Payer: Medicare HMO

## 2018-04-15 ENCOUNTER — Ambulatory Visit
Admission: RE | Admit: 2018-04-15 | Discharge: 2018-04-15 | Disposition: A | Discharge status: Home / Self Care | Payer: Medicare HMO | Source: Ambulatory Visit | Attending: Radiation Oncology | Admitting: Radiation Oncology

## 2018-04-15 DIAGNOSIS — Z510 Encounter for antineoplastic radiation therapy: Secondary | ICD-10-CM | POA: Diagnosis not present

## 2018-04-16 ENCOUNTER — Ambulatory Visit: Payer: Medicare HMO

## 2018-04-16 ENCOUNTER — Ambulatory Visit
Admission: RE | Admit: 2018-04-16 | Discharge: 2018-04-16 | Disposition: A | Discharge status: Home / Self Care | Payer: Medicare HMO | Source: Ambulatory Visit | Attending: Radiation Oncology | Admitting: Radiation Oncology

## 2018-04-16 DIAGNOSIS — Z510 Encounter for antineoplastic radiation therapy: Secondary | ICD-10-CM | POA: Diagnosis not present

## 2018-04-19 ENCOUNTER — Ambulatory Visit
Admission: RE | Admit: 2018-04-19 | Discharge: 2018-04-19 | Disposition: A | Discharge status: Home / Self Care | Payer: Medicare HMO | Source: Ambulatory Visit | Attending: Radiation Oncology | Admitting: Radiation Oncology

## 2018-04-19 ENCOUNTER — Ambulatory Visit: Payer: Medicare HMO

## 2018-04-19 DIAGNOSIS — Z510 Encounter for antineoplastic radiation therapy: Secondary | ICD-10-CM | POA: Diagnosis not present

## 2018-04-19 DIAGNOSIS — C61 Malignant neoplasm of prostate: Secondary | ICD-10-CM | POA: Diagnosis not present

## 2018-04-20 ENCOUNTER — Ambulatory Visit: Payer: Medicare HMO

## 2018-04-20 ENCOUNTER — Ambulatory Visit
Admission: RE | Admit: 2018-04-20 | Discharge: 2018-04-20 | Disposition: A | Discharge status: Home / Self Care | Payer: Medicare HMO | Source: Ambulatory Visit | Attending: Radiation Oncology | Admitting: Radiation Oncology

## 2018-04-20 ENCOUNTER — Inpatient Hospital Stay: Payer: Medicare HMO | Attending: Radiation Oncology

## 2018-04-20 DIAGNOSIS — Z510 Encounter for antineoplastic radiation therapy: Secondary | ICD-10-CM | POA: Diagnosis not present

## 2018-04-21 ENCOUNTER — Ambulatory Visit: Payer: Medicare HMO

## 2018-04-22 ENCOUNTER — Ambulatory Visit
Admission: RE | Admit: 2018-04-22 | Discharge: 2018-04-22 | Disposition: A | Discharge status: Home / Self Care | Payer: Medicare HMO | Source: Ambulatory Visit | Attending: Radiation Oncology | Admitting: Radiation Oncology

## 2018-04-22 ENCOUNTER — Ambulatory Visit: Payer: Medicare HMO

## 2018-04-22 DIAGNOSIS — Z510 Encounter for antineoplastic radiation therapy: Secondary | ICD-10-CM | POA: Diagnosis not present

## 2018-04-23 ENCOUNTER — Ambulatory Visit: Payer: Medicare HMO

## 2018-04-23 ENCOUNTER — Ambulatory Visit
Admission: RE | Admit: 2018-04-23 | Discharge: 2018-04-23 | Disposition: A | Discharge status: Home / Self Care | Payer: Medicare HMO | Source: Ambulatory Visit | Attending: Radiation Oncology | Admitting: Radiation Oncology

## 2018-04-23 DIAGNOSIS — Z510 Encounter for antineoplastic radiation therapy: Secondary | ICD-10-CM | POA: Diagnosis not present

## 2018-04-26 ENCOUNTER — Ambulatory Visit: Payer: Medicare HMO

## 2018-04-26 ENCOUNTER — Ambulatory Visit (INDEPENDENT_AMBULATORY_CARE_PROVIDER_SITE_OTHER): Payer: Medicare HMO

## 2018-04-26 ENCOUNTER — Ambulatory Visit
Admission: RE | Admit: 2018-04-26 | Discharge: 2018-04-26 | Disposition: A | Discharge status: Home / Self Care | Payer: Medicare HMO | Source: Ambulatory Visit | Attending: Radiation Oncology | Admitting: Radiation Oncology

## 2018-04-26 DIAGNOSIS — Z510 Encounter for antineoplastic radiation therapy: Secondary | ICD-10-CM | POA: Diagnosis not present

## 2018-04-26 DIAGNOSIS — C61 Malignant neoplasm of prostate: Secondary | ICD-10-CM

## 2018-04-26 MED ORDER — LEUPROLIDE ACETATE (6 MONTH) 45 MG IM KIT
45.0000 mg | PACK | Freq: Once | INTRAMUSCULAR | Status: AC
  Administered 2018-04-26: 45 mg via INTRAMUSCULAR

## 2018-04-26 NOTE — Patient Instructions (Signed)
Start Over the Counter Vitamin D plus Calcium  Leuprolide injection What is this medicine? LEUPROLIDE (loo PROE lide) is a man-made hormone. It is used to treat the symptoms of prostate cancer. This medicine may also be used to treat children with early onset of puberty. It may be used for other hormonal conditions. This medicine may be used for other purposes; ask your health care provider or pharmacist if you have questions. COMMON BRAND NAME(S): Lupron What should I tell my health care provider before I take this medicine? They need to know if you have any of these conditions: -diabetes -heart disease or previous heart attack -high blood pressure -high cholesterol -pain or difficulty passing urine -spinal cord metastasis -stroke -tobacco smoker -an unusual or allergic reaction to leuprolide, benzyl alcohol, other medicines, foods, dyes, or preservatives -pregnant or trying to get pregnant -breast-feeding How should I use this medicine? This medicine is for injection under the skin or into a muscle. You will be taught how to prepare and give this medicine. Use exactly as directed. Take your medicine at regular intervals. Do not take your medicine more often than directed. It is important that you put your used needles and syringes in a special sharps container. Do not put them in a trash can. If you do not have a sharps container, call your pharmacist or healthcare provider to get one. A special MedGuide will be given to you by the pharmacist with each prescription and refill. Be sure to read this information carefully each time. Talk to your pediatrician regarding the use of this medicine in children. While this medicine may be prescribed for children as young as 8 years for selected conditions, precautions do apply. Overdosage: If you think you have taken too much of this medicine contact a poison control center or emergency room at once. NOTE: This medicine is only for you. Do not  share this medicine with others. What if I miss a dose? If you miss a dose, take it as soon as you can. If it is almost time for your next dose, take only that dose. Do not take double or extra doses. What may interact with this medicine? Do not take this medicine with any of the following medications: -chasteberry This medicine may also interact with the following medications: -herbal or dietary supplements, like black cohosh or DHEA -male hormones, like estrogens or progestins and birth control pills, patches, rings, or injections -male hormones, like testosterone This list may not describe all possible interactions. Give your health care provider a list of all the medicines, herbs, non-prescription drugs, or dietary supplements you use. Also tell them if you smoke, drink alcohol, or use illegal drugs. Some items may interact with your medicine. What should I watch for while using this medicine? Visit your doctor or health care professional for regular checks on your progress. During the first week, your symptoms may get worse, but then will improve as you continue your treatment. You may get hot flashes, increased bone pain, increased difficulty passing urine, or an aggravation of nerve symptoms. Discuss these effects with your doctor or health care professional, some of them may improve with continued use of this medicine. Male patients may experience a menstrual cycle or spotting during the first 2 months of therapy with this medicine. If this continues, contact your doctor or health care professional. What side effects may I notice from receiving this medicine? Side effects that you should report to your doctor or health care professional as soon  as possible: -allergic reactions like skin rash, itching or hives, swelling of the face, lips, or tongue -breathing problems -chest pain -depression or memory disorders -pain in your legs or groin -pain at site where injected -severe  headache -swelling of the feet and legs -visual changes -vomiting Side effects that usually do not require medical attention (report to your doctor or health care professional if they continue or are bothersome): -breast swelling or tenderness -decrease in sex drive or performance -diarrhea -hot flashes -loss of appetite -muscle, joint, or bone pains -nausea -redness or irritation at site where injected -skin problems or acne This list may not describe all possible side effects. Call your doctor for medical advice about side effects. You may report side effects to FDA at 1-800-FDA-1088. Where should I keep my medicine? Keep out of the reach of children. Store below 25 degrees C (77 degrees F). Do not freeze. Protect from light. Do not use if it is not clear or if there are particles present. Throw away any unused medicine after the expiration date. NOTE: This sheet is a summary. It may not cover all possible information. If you have questions about this medicine, talk to your doctor, pharmacist, or health care provider.  2019 Elsevier/Gold Standard (2015-09-24 10:54:35)

## 2018-04-26 NOTE — Progress Notes (Signed)
Lupron IM Injection   Due to Prostate Cancer patient is present today for a Lupron Injection.  Medication: Lupron 6 month Dose: 45 mg  Location: left upper outer buttocks Lot: [id number] Exp: 06/23/2020  Patient tolerated well, no complications were noted  Performed by: Negin Hegg, CMA  Follow up: 6 month  

## 2018-04-27 ENCOUNTER — Ambulatory Visit: Payer: Medicare HMO

## 2018-04-27 ENCOUNTER — Ambulatory Visit
Admission: RE | Admit: 2018-04-27 | Discharge: 2018-04-27 | Disposition: A | Discharge status: Home / Self Care | Payer: Medicare HMO | Source: Ambulatory Visit | Attending: Radiation Oncology | Admitting: Radiation Oncology

## 2018-04-27 DIAGNOSIS — Z510 Encounter for antineoplastic radiation therapy: Secondary | ICD-10-CM | POA: Diagnosis not present

## 2018-04-28 ENCOUNTER — Ambulatory Visit: Payer: Medicare HMO

## 2018-04-28 ENCOUNTER — Ambulatory Visit
Admission: RE | Admit: 2018-04-28 | Discharge: 2018-04-28 | Disposition: A | Discharge status: Home / Self Care | Payer: Medicare HMO | Source: Ambulatory Visit | Attending: Radiation Oncology | Admitting: Radiation Oncology

## 2018-04-28 DIAGNOSIS — Z510 Encounter for antineoplastic radiation therapy: Secondary | ICD-10-CM | POA: Diagnosis not present

## 2018-04-29 ENCOUNTER — Ambulatory Visit
Admission: RE | Admit: 2018-04-29 | Discharge: 2018-04-29 | Disposition: A | Discharge status: Home / Self Care | Payer: Medicare HMO | Source: Ambulatory Visit | Attending: Radiation Oncology | Admitting: Radiation Oncology

## 2018-04-29 ENCOUNTER — Other Ambulatory Visit: Payer: Self-pay

## 2018-04-29 ENCOUNTER — Ambulatory Visit: Payer: Medicare HMO

## 2018-04-29 DIAGNOSIS — Z510 Encounter for antineoplastic radiation therapy: Secondary | ICD-10-CM | POA: Diagnosis not present

## 2018-04-30 ENCOUNTER — Other Ambulatory Visit: Payer: Self-pay

## 2018-04-30 ENCOUNTER — Ambulatory Visit
Admission: RE | Admit: 2018-04-30 | Discharge: 2018-04-30 | Disposition: A | Discharge status: Home / Self Care | Payer: Medicare HMO | Source: Ambulatory Visit | Attending: Radiation Oncology | Admitting: Radiation Oncology

## 2018-04-30 ENCOUNTER — Ambulatory Visit: Payer: Medicare HMO

## 2018-04-30 DIAGNOSIS — Z510 Encounter for antineoplastic radiation therapy: Secondary | ICD-10-CM | POA: Diagnosis not present

## 2018-05-03 ENCOUNTER — Other Ambulatory Visit: Payer: Self-pay

## 2018-05-03 ENCOUNTER — Ambulatory Visit: Payer: Medicare HMO

## 2018-05-03 ENCOUNTER — Ambulatory Visit
Admission: RE | Admit: 2018-05-03 | Discharge: 2018-05-03 | Disposition: A | Discharge status: Home / Self Care | Payer: Medicare HMO | Source: Ambulatory Visit | Attending: Radiation Oncology | Admitting: Radiation Oncology

## 2018-05-03 DIAGNOSIS — Z510 Encounter for antineoplastic radiation therapy: Secondary | ICD-10-CM | POA: Diagnosis not present

## 2018-05-04 ENCOUNTER — Other Ambulatory Visit: Payer: Self-pay

## 2018-05-04 ENCOUNTER — Ambulatory Visit: Payer: Medicare HMO

## 2018-05-04 ENCOUNTER — Ambulatory Visit
Admission: RE | Admit: 2018-05-04 | Discharge: 2018-05-04 | Disposition: A | Discharge status: Home / Self Care | Payer: Medicare HMO | Source: Ambulatory Visit | Attending: Radiation Oncology | Admitting: Radiation Oncology

## 2018-05-04 DIAGNOSIS — Z510 Encounter for antineoplastic radiation therapy: Secondary | ICD-10-CM | POA: Diagnosis not present

## 2018-05-05 ENCOUNTER — Ambulatory Visit: Payer: Medicare HMO

## 2018-05-05 ENCOUNTER — Other Ambulatory Visit: Payer: Self-pay

## 2018-05-05 ENCOUNTER — Ambulatory Visit
Admission: RE | Admit: 2018-05-05 | Discharge: 2018-05-05 | Disposition: A | Discharge status: Home / Self Care | Payer: Medicare HMO | Source: Ambulatory Visit | Attending: Radiation Oncology | Admitting: Radiation Oncology

## 2018-05-05 ENCOUNTER — Ambulatory Visit: Payer: Medicare HMO | Admitting: Surgery

## 2018-05-05 DIAGNOSIS — Z510 Encounter for antineoplastic radiation therapy: Secondary | ICD-10-CM | POA: Diagnosis not present

## 2018-05-06 ENCOUNTER — Ambulatory Visit
Admission: RE | Admit: 2018-05-06 | Discharge: 2018-05-06 | Disposition: A | Discharge status: Home / Self Care | Payer: Medicare HMO | Source: Ambulatory Visit | Attending: Radiation Oncology | Admitting: Radiation Oncology

## 2018-05-06 ENCOUNTER — Other Ambulatory Visit: Payer: Self-pay

## 2018-05-06 DIAGNOSIS — Z510 Encounter for antineoplastic radiation therapy: Secondary | ICD-10-CM | POA: Diagnosis not present

## 2018-05-24 ENCOUNTER — Ambulatory Visit: Payer: Medicare HMO | Admitting: Surgery

## 2018-06-11 ENCOUNTER — Ambulatory Visit: Payer: Medicare HMO | Admitting: Radiation Oncology

## 2018-06-11 DIAGNOSIS — C61 Malignant neoplasm of prostate: Secondary | ICD-10-CM | POA: Insufficient documentation

## 2018-06-23 ENCOUNTER — Ambulatory Visit: Payer: Medicare HMO | Admitting: Surgery

## 2018-07-05 ENCOUNTER — Encounter: Payer: Self-pay | Admitting: Surgery

## 2018-07-05 ENCOUNTER — Other Ambulatory Visit: Payer: Self-pay

## 2018-07-05 ENCOUNTER — Ambulatory Visit (INDEPENDENT_AMBULATORY_CARE_PROVIDER_SITE_OTHER): Payer: Medicare HMO | Admitting: Surgery

## 2018-07-05 VITALS — BP 162/76 | HR 73 | Temp 97.7°F | Resp 16 | Ht 73.0 in | Wt 151.6 lb

## 2018-07-05 DIAGNOSIS — K4090 Unilateral inguinal hernia, without obstruction or gangrene, not specified as recurrent: Secondary | ICD-10-CM

## 2018-07-05 NOTE — Progress Notes (Signed)
Outpatient Surgical Follow Up  07/05/2018  Jeremy [name] is an 73 y.o. male.   Chief Complaint  Patient presents with  . Follow-up    RIH    HPI: 73 year old male with a history of prostate cancer just completed radiation therapy and did well.  He comes for follow-up for asymptomatic large inguinal hernia and umbilical hernia as well.  Some intermittent right groin pain.  No fevers no chills he is able to walk and feed himself.  He is in a skilled nursing facility due to seizure disorder.  He is able to walk.  Denies any chest pain no shortness of breath. The scan personally reviewed showing evidence of a large right inguinal hernia containing cecum and small bowel.  Past Medical History:  Diagnosis Date  . Cancer ([place]) 2019   prostate /radiation  . Dementia ([place])   . Hypertension   . Seizures ([place])     Past Surgical History:  Procedure Laterality Date  . none    . PROSTATE BIOPSY N/A 12/15/2017   Procedure: BIOPSY TRANSRECTAL ULTRASONIC PROSTATE (TUBP);  Surgeon: [name], MD;  Location: [facility] ORS;  Service: Urology;  Laterality: N/A;    History reviewed. No pertinent family history.  Social History:  reports that he has quit smoking. He quit after 25.00 years of use. He has never used smokeless tobacco. He reports previous alcohol use. He reports previous drug use. Drugs: Cocaine and IV.  Allergies: No Known Allergies  Medications reviewed.    ROS Full ROS performed and is otherwise negative other than what is stated in HPI   BP (!) 162/76   Pulse 73   Temp 97.7 F (36.5 C) (Temporal)   Resp 16   Ht 6\' 1"  (1.854 m)   Wt 151 lb 9.6 oz (68.8 kg)   SpO2 97%   BMI 20.00 kg/m   Physical Exam Vitals signs and nursing note reviewed. Exam conducted with a chaperone present.  Constitutional:      General: He is not in acute distress.    Appearance: He is normal weight.  Pulmonary:     Effort: Pulmonary effort is normal.     Breath sounds: No  stridor.  Abdominal:     General: There is no distension.     Palpations: Abdomen is soft. There is no mass.     Tenderness: There is no abdominal tenderness.     Hernia: A hernia is present.     Comments: Large RIH reducible and reducible UH  Musculoskeletal: Normal range of motion.  Skin:    General: Skin is warm and dry.     Coloration: Skin is not jaundiced.  Neurological:     General: No focal deficit present.     Mental Status: He is alert and oriented to person, place, and time.  Psychiatric:        Mood and Affect: Mood normal.        Behavior: Behavior normal.        Thought Content: Thought content normal.        Judgment: Judgment normal.       Assessment/Plan: 73 year old male status post radiation for prostate cancer now with a symptomatic right inguinal hernia and umbilical hernia. Cussed with the patient in detail he is recovering from radiation and still little bit weak.  I will see him back in about a month hopefully at that time he will be ready for elective robotic assisted right inguinal hernia as well as an  umbilical hernia at the same time.  His hernia reduces. No need for emergent surgical intervention at this time Greater than 50% of the 25 minutes  visit was spent in counseling/coordination of care   Caroleen Hamman, MD [place] [facility]

## 2018-07-05 NOTE — Patient Instructions (Addendum)
Please see your follow up appointment listed below.    Inguinal Hernia, Adult An inguinal hernia is when fat or your intestines push through a weak spot in a muscle where your leg meets your lower belly (groin). This causes a rounded lump (bulge). This kind of hernia could also be:  In your scrotum, if you are male.  In folds of skin around your vagina, if you are male. There are three types of inguinal hernias. These include:  Hernias that can be pushed back into the belly (are reducible). This type rarely causes pain.  Hernias that cannot be pushed back into the belly (are incarcerated).  Hernias that cannot be pushed back into the belly and lose their blood supply (are strangulated). This type needs emergency surgery. If you do not have symptoms, you may not need treatment. If you have symptoms or a large hernia, you may need surgery. Follow these instructions at home: Lifestyle  Do these things if told by your doctor so you do not have trouble pooping (constipation): ? Drink enough fluid to keep your pee (urine) pale yellow. ? Eat foods that have a lot of fiber. These include fresh fruits and vegetables, whole grains, and beans. ? Limit foods that are high in fat and processed sugars. These include foods that are fried or sweet. ? Take medicine for trouble pooping.  Avoid lifting heavy objects.  Avoid standing for long amounts of time.  Do not use any products that contain nicotine or tobacco. These include cigarettes and e-cigarettes. If you need help quitting, ask your doctor.  Stay at a healthy weight. General instructions  You may try to push your hernia in by very gently pressing on it when you are lying down. Do not try to force the bulge back in if it will not push in easily.  Watch your hernia for any changes in shape, size, or color. Tell your doctor if you see any changes.  Take over-the-counter and prescription medicines only as told by your doctor.  Keep all  follow-up visits as told by your doctor. This is important. Contact a doctor if:  You have a fever.  You have new symptoms.  Your symptoms get worse. Get help right away if:  The area where your leg meets your lower belly has: ? Pain that gets worse suddenly. ? A bulge that gets bigger suddenly, and it does not get smaller after that. ? A bulge that turns red or purple. ? A bulge that is painful when you touch it.  You are a man, and your scrotum: ? Suddenly feels painful. ? Suddenly changes in size.  You cannot push the hernia in by very gently pressing on it when you are lying down. Do not try to force the bulge back in if it will not push in easily.  You feel sick to your stomach (nauseous), and that feeling does not go away.  You throw up (vomit), and that keeps happening.  You have a fast heartbeat.  You cannot poop (have a bowel movement) or pass gas. These symptoms may be an emergency. Do not wait to see if the symptoms will go away. Get medical help right away. Call your local emergency services (911 in the [place] Summary  An inguinal hernia is when fat or your intestines push through a weak spot in a muscle where your leg meets your lower belly (groin). This causes a rounded lump (bulge).  If you do not have symptoms, you may not  need treatment. If you have symptoms or a large hernia, you may need surgery.  Avoid lifting heavy objects. Also avoid standing for long amounts of time.  Do not try to force the bulge back in if it will not push in easily. This information is not intended to replace advice given to you by your health care provider. Make sure you discuss any questions you have with your health care provider. Document Released: 03/06/2006 Document Revised: 03/07/2017 Document Reviewed: 11/05/2016 Elsevier Interactive Patient Education  2019 [employer].

## 2018-07-22 ENCOUNTER — Other Ambulatory Visit: Payer: Self-pay

## 2018-07-23 ENCOUNTER — Encounter: Payer: Self-pay | Admitting: Radiation Oncology

## 2018-07-23 ENCOUNTER — Other Ambulatory Visit: Payer: Self-pay

## 2018-07-23 ENCOUNTER — Ambulatory Visit
Admission: RE | Admit: 2018-07-23 | Discharge: 2018-07-23 | Disposition: A | Discharge status: Home / Self Care | Payer: Medicare HMO | Source: Ambulatory Visit | Attending: Radiation Oncology | Admitting: Radiation Oncology

## 2018-07-23 VITALS — BP 135/75 | HR 77 | Temp 97.1°F | Resp 16 | Wt 151.8 lb

## 2018-07-23 DIAGNOSIS — K429 Umbilical hernia without obstruction or gangrene: Secondary | ICD-10-CM | POA: Diagnosis not present

## 2018-07-23 DIAGNOSIS — R351 Nocturia: Secondary | ICD-10-CM | POA: Diagnosis not present

## 2018-07-23 DIAGNOSIS — Z923 Personal history of irradiation: Secondary | ICD-10-CM | POA: Insufficient documentation

## 2018-07-23 DIAGNOSIS — K449 Diaphragmatic hernia without obstruction or gangrene: Secondary | ICD-10-CM | POA: Insufficient documentation

## 2018-07-23 DIAGNOSIS — F0390 Unspecified dementia without behavioral disturbance: Secondary | ICD-10-CM | POA: Diagnosis not present

## 2018-07-23 DIAGNOSIS — C61 Malignant neoplasm of prostate: Secondary | ICD-10-CM

## 2018-07-23 NOTE — Progress Notes (Signed)
Radiation Oncology Follow up Note  Name: Jeremy [name]   Date:   07/23/2018 MRN:  [id number] DOB: 03-23-1945    This 73 y.o. 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  He is seen today in routine follow-up is doing well he states he has slightly more nocturia about 3 times a night.  Specifically denies any urgency or frequency or diarrhea..  Patient does have dementia although is quite functional.  He is a resident of a skilled nursing facility.  He also is scheduled for surgery next month for a symptomatic right inguinal hernia and umbilical hernia.  COMPLICATIONS OF TREATMENT: none  FOLLOW UP COMPLIANCE: keeps appointments   PHYSICAL EXAM:  BP 135/75 (BP Location: Left Arm, Patient Position: Sitting)   Pulse 77   Temp (!) 97.1 F (36.2 C) (Tympanic)   Resp 16   Wt 151 lb 12.6 oz (68.9 kg)   BMI 20.03 kg/m  Well-developed well-nourished patient in NAD. HEENT reveals PERLA, EOMI, discs not visualized.  Oral cavity is clear. No oral mucosal lesions are identified. Neck is clear without evidence of cervical or supraclavicular adenopathy. Lungs are clear to A&P. Cardiac examination is essentially unremarkable with regular rate and rhythm without murmur rub or thrill. Abdomen is benign with no organomegaly or masses noted. Motor sensory and DTR levels are equal and symmetric in the upper and lower extremities. Cranial nerves II through XII are grossly intact. Proprioception is intact. No peripheral adenopathy or edema is identified. No motor or sensory levels are noted. Crude visual fields  are within normal range.  RADIOLOGY RESULTS: No current films for review  PLAN: Present time patient is doing well from a urologic standpoint status post IMRT radiation therapy for stage IIb adenocarcinoma the prostate.  I am pleased with his overall progress progress and side effect profile.  I have asked to see him back in 3 months for follow-up with a PSA prior to that visit.  He will have umbilical hernia and inguinal hernia surgery repair within the next month.  Patient knows to contact us should any problems develop.  I would like to take this opportunity to thank you for allowing me to participate in the care of your patient.Noreene Filbert, MD

## 2018-08-18 ENCOUNTER — Encounter: Payer: Self-pay | Admitting: Surgery

## 2018-08-18 ENCOUNTER — Encounter: Payer: Self-pay | Admitting: *Deleted

## 2018-08-18 ENCOUNTER — Other Ambulatory Visit: Payer: Self-pay

## 2018-08-18 ENCOUNTER — Ambulatory Visit (INDEPENDENT_AMBULATORY_CARE_PROVIDER_SITE_OTHER): Payer: Medicare HMO | Admitting: Surgery

## 2018-08-18 VITALS — BP 135/74 | HR 75 | Temp 97.3°F | Resp 14 | Ht 67.0 in | Wt 155.0 lb

## 2018-08-18 DIAGNOSIS — K4090 Unilateral inguinal hernia, without obstruction or gangrene, not specified as recurrent: Secondary | ICD-10-CM

## 2018-08-18 NOTE — Progress Notes (Signed)
Outpatient Surgical Follow Up  08/18/2018  Jeremy [name] is an 73 y.o. male.   Chief Complaint  Patient presents with  . Follow-up    right inguinal and umbilical hernia    HPI: 73 yo well known to me, He comes for follow-up for asymptomatic large inguinal hernia and umbilical hernia as well.  Some intermittent right groin pain.  No fevers no chills he is able to walk and feed himself.  He is in a skilled nursing facility due to seizure disorder.  He is able to walk.  Denies any chest pain no shortness of breath. CT scan personally reviewed showing evidence of a large right inguinal hernia containing cecum and small bowel. He has completed radiation therapy for his prostate cancer and is feeling much stronger than before.  No side effects of the radiation.   Past Medical History:  Diagnosis Date  . Cancer ([place]) 2019   prostate /radiation  . Dementia ([place])   . Hypertension   . Seizures ([place])     Past Surgical History:  Procedure Laterality Date  . none    . PROSTATE BIOPSY N/A 12/15/2017   Procedure: BIOPSY TRANSRECTAL ULTRASONIC PROSTATE (TUBP);  Surgeon: [name], MD;  Location: [facility] ORS;  Service: Urology;  Laterality: N/A;    History reviewed. No pertinent family history.  Social History:  reports that he has quit smoking. He quit after 25.00 years of use. He has never used smokeless tobacco. He reports previous alcohol use. He reports previous drug use. Drugs: Cocaine and IV.  Allergies: No Known Allergies  Medications reviewed.    ROS Full ROS performed and is otherwise negative other than what is stated in HPI   BP 135/74   Pulse 75   Temp (!) 97.3 F (36.3 C)   Resp 14   Ht 5\' 7"  (1.702 m)   Wt 155 lb (70.3 kg)   SpO2 99%   BMI 24.28 kg/m   Physical Exam Vitals signs and nursing note reviewed. Exam conducted with a chaperone present.  Constitutional:      Appearance: Normal appearance. He is normal weight.  Neck:     Musculoskeletal:  Normal range of motion and neck supple. No neck rigidity.  Cardiovascular:     Rate and Rhythm: Normal rate.     Pulses: Normal pulses.     Heart sounds: Normal heart sounds. No murmur.  Pulmonary:     Effort: Pulmonary effort is normal. No respiratory distress.     Breath sounds: Normal breath sounds. No stridor.  Abdominal:     General: Abdomen is flat. There is no distension.     Palpations: There is no mass.     Tenderness: There is no abdominal tenderness. There is no guarding or rebound.     Hernia: A hernia is present.     Comments: Large RIH reducible, reducible [facility]  Skin:    General: Skin is warm and dry.     Capillary Refill: Capillary refill takes less than 2 seconds.     Coloration: Skin is not jaundiced.  Neurological:     General: No focal deficit present.     Mental Status: He is alert and oriented to person, place, and time.  Psychiatric:        Mood and Affect: Mood normal.        Thought Content: Thought content normal.        Judgment: Judgment normal.      Assessment/Plan:  73 year old male  with a symptomatic right inguinal hernia that is very large as well as an umbilical hernia.  Completed radiation therapy and feels much better and is stronger.  Discussed with the patient detail about my recommendation for repair.  I do think that he is an excellent candidate for robotic repair.  Discussed with patient detail about the procedure.  Risks, benefits and possible complications including but not limited to: Bleeding infection bowel injury, recurrence, pain.  Also discussed with patient detail about the chance of bilateral repair if in fact we find and no other defect.  Extensive counseling provided. Since pt is NH resident we will need to do rapid covid-19 testing the day of surgery ( unable to quarantine) . Greater than 50% of the 25 minutes  visit was spent in counseling/coordination of care   Caroleen Hamman, MD [place] [facility]

## 2018-08-18 NOTE — Patient Instructions (Signed)
You have chose to have your hernia repaired.    You will need to arrange to be out of work for 2 weeks and then return with a lifting restrictions for 4 more weeks. Please send any FMLA paperwork prior to surgery and we will fill this out and fax it back to your employer within 3 business days.  You may have a bruise in your groin and also swelling and brusing in your testicle area. You may use ice 4-5 times daily for 15-20 minutes each time. Make sure that you place a barrier between you and the ice pack. To decrease the swelling, you may roll up a bath towel and place it vertically in between your thighs with your testicles resting on the towel. You will want to keep this area elevated as much as possible for several days following surgery.    Inguinal Hernia, Adult Muscles help keep everything in the body in its proper place. But if a weak spot in the muscles develops, something can poke through. That is called a hernia. When this happens in the lower part of the belly (abdomen), it is called an inguinal hernia. (It takes its name from a part of the body in this region called the inguinal canal.) A weak spot in the wall of muscles lets some fat or part of the small intestine bulge through. An inguinal hernia can develop at any age. Men get them more often than women. CAUSES  In adults, an inguinal hernia develops over time.  It can be triggered by:  Suddenly straining the muscles of the lower abdomen.  Lifting heavy objects.  Straining to have a bowel movement. Difficult bowel movements (constipation) can lead to this.  Constant coughing. This may be caused by smoking or lung disease.  Being overweight.  Being pregnant.  Working at a job that requires long periods of standing or heavy lifting.  Having had an inguinal hernia before. One type can be an emergency situation. It is called a strangulated inguinal hernia. It develops if part of the small intestine slips through the weak  spot and cannot get back into the abdomen. The blood supply can be cut off. If that happens, part of the intestine may die. This situation requires emergency surgery. SYMPTOMS  Often, a small inguinal hernia has no symptoms. It is found when a healthcare provider does a physical exam. Larger hernias usually have symptoms.   In adults, symptoms may include:  A lump in the groin. This is easier to see when the person is standing. It might disappear when lying down.  In men, a lump in the scrotum.  Pain or burning in the groin. This occurs especially when lifting, straining or coughing.  A dull ache or feeling of pressure in the groin.  Signs of a strangulated hernia can include:  A bulge in the groin that becomes very painful and tender to the touch.  A bulge that turns red or purple.  Fever, nausea and vomiting.  Inability to have a bowel movement or to pass gas. DIAGNOSIS  To decide if you have an inguinal hernia, a healthcare provider will probably do a physical examination.  This will include asking questions about any symptoms you have noticed.  The healthcare provider might feel the groin area and ask you to cough. If an inguinal hernia is felt, the healthcare provider may try to slide it back into the abdomen.  Usually no other tests are needed. TREATMENT  Treatments can vary. The  size of the hernia makes a difference. Options include:  Watchful waiting. This is often suggested if the hernia is small and you have had no symptoms.  No medical procedure will be done unless symptoms develop.  You will need to watch closely for symptoms. If any occur, contact your healthcare provider right away.  Surgery. This is used if the hernia is larger or you have symptoms.  Open surgery. This is usually an outpatient procedure (you will not stay overnight in a hospital). An cut (incision) is made through the skin in the groin. The hernia is put back inside the abdomen. The weak area  in the muscles is then repaired by herniorrhaphy or hernioplasty. Herniorrhaphy: in this type of surgery, the weak muscles are sewn back together. Hernioplasty: a patch or mesh is used to close the weak area in the abdominal wall.  Laparoscopy. In this procedure, a surgeon makes small incisions. A thin tube with a tiny video camera (called a laparoscope) is put into the abdomen. The surgeon repairs the hernia with mesh by looking with the video camera and using two long instruments. HOME CARE INSTRUCTIONS   After surgery to repair an inguinal hernia:  You will need to take pain medicine prescribed by your healthcare provider. Follow all directions carefully.  You will need to take care of the wound from the incision.  Your activity will be restricted for awhile. This will probably include no heavy lifting for several weeks. You also should not do anything too active for a few weeks. When you can return to work will depend on the type of job that you have.  During "watchful waiting" periods, you should:  Maintain a healthy weight.  Eat a diet high in fiber (fruits, vegetables and whole grains).  Drink plenty of fluids to avoid constipation. This means drinking enough water and other liquids to keep your urine clear or pale yellow.  Do not lift heavy objects.  Do not stand for long periods of time.  Quit smoking. This should keep you from developing a frequent cough. SEEK MEDICAL CARE IF:   A bulge develops in your groin area.  You feel pain, a burning sensation or pressure in the groin. This might be worse if you are lifting or straining.  You develop a fever of more than 100.5 F (38.1 C). SEEK IMMEDIATE MEDICAL CARE IF:   Pain in the groin increases suddenly.  A bulge in the groin gets bigger suddenly and does not go down.  For men, there is sudden pain in the scrotum. Or, the size of the scrotum increases.  A bulge in the groin area becomes red or purple and is painful to  touch.  You have nausea or vomiting that does not go away.  You feel your heart beating much faster than normal.  You cannot have a bowel movement or pass gas.  You develop a fever of more than 102.0 F (38.9 C).   This information is not intended to replace advice given to you by your health care provider. Make sure you discuss any questions you have with your health care provider.   Document Released: 06/22/2008 Document Revised: 04/28/2011 Document Reviewed: 08/07/2014 Elsevier Interactive Patient Education [employer].

## 2018-08-18 NOTE — Progress Notes (Signed)
Patient's surgery to be scheduled for 09-02-18 at [facility] with Dr. Dahlia Byes.   The patient is aware to have COVID-19 testing done day of surgery at the [facility] drive thru ([address] [place]) because he lives in a facility and will require a rapid test.   The patient is aware he will need to Pre-Admit. Patient will check in at the [facility] entrance due to COVID-19 restrictions and will then be escorted to the [place], [address] (first floor). Patient will be contacted once Pre-admission appointment has been arranged with date and time.   He is aware to check in at the [place] entrance day of surgery.  The patient verbalizes understanding of the above.   The patient is aware to call the office should he have further questions.

## 2018-08-18 NOTE — H&P (View-Only) (Signed)
Outpatient Surgical Follow Up  08/18/2018  Jeremy [name] is an 73 y.o. male.   Chief Complaint  Patient presents with  . Follow-up    right inguinal and umbilical hernia    HPI: 73 yo well known to me, He comes for follow-up for asymptomatic large inguinal hernia and umbilical hernia as well.  Some intermittent right groin pain.  No fevers no chills he is able to walk and feed himself.  He is in a skilled nursing facility due to seizure disorder.  He is able to walk.  Denies any chest pain no shortness of breath. CT scan personally reviewed showing evidence of a large right inguinal hernia containing cecum and small bowel. He has completed radiation therapy for his prostate cancer and is feeling much stronger than before.  No side effects of the radiation.   Past Medical History:  Diagnosis Date  . Cancer ([place]) 2019   prostate /radiation  . Dementia ([place])   . Hypertension   . Seizures ([place])     Past Surgical History:  Procedure Laterality Date  . none    . PROSTATE BIOPSY N/A 12/15/2017   Procedure: BIOPSY TRANSRECTAL ULTRASONIC PROSTATE (TUBP);  Surgeon: [name], MD;  Location: [facility] ORS;  Service: Urology;  Laterality: N/A;    History reviewed. No pertinent family history.  Social History:  reports that he has quit smoking. He quit after 25.00 years of use. He has never used smokeless tobacco. He reports previous alcohol use. He reports previous drug use. Drugs: Cocaine and IV.  Allergies: No Known Allergies  Medications reviewed.    ROS Full ROS performed and is otherwise negative other than what is stated in HPI   BP 135/74   Pulse 75   Temp (!) 97.3 F (36.3 C)   Resp 14   Ht 5\' 7"  (1.702 m)   Wt 155 lb (70.3 kg)   SpO2 99%   BMI 24.28 kg/m   Physical Exam Vitals signs and nursing note reviewed. Exam conducted with a chaperone present.  Constitutional:      Appearance: Normal appearance. He is normal weight.  Neck:     Musculoskeletal:  Normal range of motion and neck supple. No neck rigidity.  Cardiovascular:     Rate and Rhythm: Normal rate.     Pulses: Normal pulses.     Heart sounds: Normal heart sounds. No murmur.  Pulmonary:     Effort: Pulmonary effort is normal. No respiratory distress.     Breath sounds: Normal breath sounds. No stridor.  Abdominal:     General: Abdomen is flat. There is no distension.     Palpations: There is no mass.     Tenderness: There is no abdominal tenderness. There is no guarding or rebound.     Hernia: A hernia is present.     Comments: Large RIH reducible, reducible UH  Skin:    General: Skin is warm and dry.     Capillary Refill: Capillary refill takes less than 2 seconds.     Coloration: Skin is not jaundiced.  Neurological:     General: No focal deficit present.     Mental Status: He is alert and oriented to person, place, and time.  Psychiatric:        Mood and Affect: Mood normal.        Thought Content: Thought content normal.        Judgment: Judgment normal.      Assessment/Plan:  73 year old male  with a symptomatic right inguinal hernia that is very large as well as an umbilical hernia.  Completed radiation therapy and feels much better and is stronger.  Discussed with the patient detail about my recommendation for repair.  I do think that he is an excellent candidate for robotic repair.  Discussed with patient detail about the procedure.  Risks, benefits and possible complications including but not limited to: Bleeding infection bowel injury, recurrence, pain.  Also discussed with patient detail about the chance of bilateral repair if in fact we find and no other defect.  Extensive counseling provided. Since pt is NH resident we will need to do rapid covid-19 testing the day of surgery ( unable to quarantine) . Greater than 50% of the 25 minutes  visit was spent in counseling/coordination of care   Caroleen Hamman, MD [place] Surgeon

## 2018-08-19 ENCOUNTER — Telehealth: Payer: Self-pay | Admitting: *Deleted

## 2018-08-19 NOTE — Telephone Encounter (Signed)
Madelynn Done, Administrator at the facility where patient resides, was contacted today.   He was made aware that patient's surgery has been scheduled for 09-02-18. Patient is to report the day of surgery at 8 am at the [facility] Th[facility] for rapid COVID testing.   He was also made aware that patient will need to Pre-admit on 08-30-18 at 11 am.  He was instructed to call the office should they have further questions.

## 2018-08-30 ENCOUNTER — Other Ambulatory Visit: Payer: Self-pay

## 2018-08-30 ENCOUNTER — Encounter
Admission: RE | Admit: 2018-08-30 | Discharge: 2018-08-30 | Disposition: A | Discharge status: Home / Self Care | Payer: Medicare HMO | Source: Ambulatory Visit | Attending: Surgery | Admitting: Surgery

## 2018-08-30 DIAGNOSIS — I10 Essential (primary) hypertension: Secondary | ICD-10-CM | POA: Insufficient documentation

## 2018-08-30 DIAGNOSIS — F0390 Unspecified dementia without behavioral disturbance: Secondary | ICD-10-CM | POA: Diagnosis not present

## 2018-08-30 DIAGNOSIS — G40909 Epilepsy, unspecified, not intractable, without status epilepticus: Secondary | ICD-10-CM | POA: Diagnosis not present

## 2018-08-30 DIAGNOSIS — Z87891 Personal history of nicotine dependence: Secondary | ICD-10-CM | POA: Diagnosis not present

## 2018-08-30 DIAGNOSIS — K4090 Unilateral inguinal hernia, without obstruction or gangrene, not specified as recurrent: Secondary | ICD-10-CM | POA: Diagnosis not present

## 2018-08-30 DIAGNOSIS — J449 Chronic obstructive pulmonary disease, unspecified: Secondary | ICD-10-CM | POA: Diagnosis not present

## 2018-08-30 DIAGNOSIS — Z923 Personal history of irradiation: Secondary | ICD-10-CM | POA: Diagnosis not present

## 2018-08-30 DIAGNOSIS — Z01818 Encounter for other preprocedural examination: Secondary | ICD-10-CM | POA: Insufficient documentation

## 2018-08-30 DIAGNOSIS — K429 Umbilical hernia without obstruction or gangrene: Secondary | ICD-10-CM | POA: Diagnosis not present

## 2018-08-30 DIAGNOSIS — Z1159 Encounter for screening for other viral diseases: Secondary | ICD-10-CM | POA: Diagnosis not present

## 2018-08-30 LAB — CBC
HCT: 28.9 % — ABNORMAL LOW (ref 39.0–52.0)
Hemoglobin: 9.5 g/dL — ABNORMAL LOW (ref 13.0–17.0)
MCH: 30.5 pg (ref 26.0–34.0)
MCHC: 32.9 g/dL (ref 30.0–36.0)
MCV: 92.9 fL (ref 80.0–100.0)
Platelets: 230 10*3/uL (ref 150–400)
RBC: 3.11 MIL/uL — ABNORMAL LOW (ref 4.22–5.81)
RDW: 14.2 % (ref 11.5–15.5)
WBC: 4.3 10*3/uL (ref 4.0–10.5)
nRBC: 0 % (ref 0.0–0.2)

## 2018-08-30 LAB — BASIC METABOLIC PANEL
Anion gap: 6 (ref 5–15)
BUN: 18 mg/dL (ref 8–23)
CO2: 29 mmol/L (ref 22–32)
Calcium: 9.4 mg/dL (ref 8.9–10.3)
Chloride: 106 mmol/L (ref 98–111)
Creatinine, Ser: 1.31 mg/dL — ABNORMAL HIGH (ref 0.61–1.24)
GFR calc Af Amer: >60 mL/min (ref >60)
GFR calc non Af Amer: 54 mL/min — ABNORMAL LOW (ref >60)
Glucose, Bld: 94 mg/dL (ref 70–99)
Potassium: 3.8 mmol/L (ref 3.5–5.1)
Sodium: 141 mmol/L (ref 135–145)

## 2018-08-30 NOTE — Patient Instructions (Signed)
Your procedure is scheduled on: 09/02/18 Report to Day Surgery.[facility]OND FLO[facility] To find out your arrival time please call [phone] between 1PM - 3PM on 09/01/18.  Remember: Instructions that are not followed completely may result in serious medical risk,  up to and including death, or upon the discretion of your surgeon and anesthesiologist your  surgery may need to be rescheduled.     _X__ 1. Do not eat food after midnight the night before your procedure.                 No gum chewing or hard candies. You may drink clear liquids up to 2 hours                 before you are scheduled to arrive for your surgery- DO not drink clear                 liquids within 2 hours of the start of your surgery.                 Clear Liquids include:  water, apple juice without pulp, clear carbohydrate                 drink such as Clearfast of Gatorade, Black Coffee or Tea (Do not add                 anything to coffee or tea).  __X__2.  On the morning of surgery brush your teeth with toothpaste and water, you                may rinse your mouth with mouthwash if you wish.  Do not swallow any toothpaste of mouthwash.     _X__ 3.  No Alcohol for 24 hours before or after surgery.   _X__ 4.  Do Not Smoke or use e-cigarettes For 24 Hours Prior to Your Surgery.                 Do not use any chewable tobacco products for at least 6 hours prior to                 surgery.  ____  5.  Bring all medications with you on the day of surgery if instructed.   _X___  6.  Notify your doctor if there is any change in your medical condition      (cold, fever, infections).     Do not wear jewelry, make-up, hairpins, clips or nail polish. Do not wear lotions, powders, or perfumes. You may wear deodorant. Do not shave 48 hours prior to surgery. Men may shave face and neck. Do not bring valuables to the hospital.    [facility] is not responsible for any belongings or  valuables.  Contacts, dentures or bridgework may not be worn into surgery. Leave your suitcase in the car. After surgery it may be brought to your room. For patients admitted to the hospital, discharge time is determined by your treatment team.   Patients discharged the day of surgery will not be allowed to drive home.   Please read over the following fact sheets that you were given:   Surgical Site Infection Prevention          ____ Take these medicines the morning of surgery with A SIP OF WATER:    1. LAMOTRIGINE  2. KEPPRA  3. RISOERIDONE  4.  5.  6.  ____ Fleet Enema (as directed)   __X__  Use CHG Soap as directed  ____ Use inhalers on the day of surgery  ____ Stop metformin 2 days prior to surgery    ____ Take 1/2 of usual insulin dose the night before surgery. No insulin the morning          of surgery.   ____ Stop Coumadin/Plavix/aspirin on   ____ Stop Anti-inflammatories on   ____ Stop supplements until after surgery.    ____ Bring C-Pap to the hospital.

## 2018-09-02 ENCOUNTER — Other Ambulatory Visit
Admission: RE | Admit: 2018-09-02 | Discharge: 2018-09-02 | Disposition: A | Discharge status: Home / Self Care | Payer: Medicare HMO | Source: Ambulatory Visit | Attending: Surgery | Admitting: Surgery

## 2018-09-02 ENCOUNTER — Encounter
Admission: RE | Disposition: A | Discharge status: Home / Self Care | Payer: Self-pay | Source: Home / Self Care | Attending: Surgery

## 2018-09-02 ENCOUNTER — Ambulatory Visit: Payer: Medicare HMO | Admitting: Anesthesiology

## 2018-09-02 ENCOUNTER — Other Ambulatory Visit: Payer: Self-pay

## 2018-09-02 ENCOUNTER — Ambulatory Visit
Admission: RE | Admit: 2018-09-02 | Discharge: 2018-09-02 | Disposition: A | Discharge status: Home / Self Care | Payer: Medicare HMO | Attending: Surgery | Admitting: Surgery

## 2018-09-02 DIAGNOSIS — J449 Chronic obstructive pulmonary disease, unspecified: Secondary | ICD-10-CM | POA: Insufficient documentation

## 2018-09-02 DIAGNOSIS — Z923 Personal history of irradiation: Secondary | ICD-10-CM | POA: Diagnosis not present

## 2018-09-02 DIAGNOSIS — I10 Essential (primary) hypertension: Secondary | ICD-10-CM | POA: Diagnosis not present

## 2018-09-02 DIAGNOSIS — G40909 Epilepsy, unspecified, not intractable, without status epilepticus: Secondary | ICD-10-CM | POA: Diagnosis not present

## 2018-09-02 DIAGNOSIS — Z87891 Personal history of nicotine dependence: Secondary | ICD-10-CM | POA: Diagnosis not present

## 2018-09-02 DIAGNOSIS — K4090 Unilateral inguinal hernia, without obstruction or gangrene, not specified as recurrent: Secondary | ICD-10-CM | POA: Diagnosis not present

## 2018-09-02 DIAGNOSIS — Z1159 Encounter for screening for other viral diseases: Secondary | ICD-10-CM | POA: Insufficient documentation

## 2018-09-02 DIAGNOSIS — F0390 Unspecified dementia without behavioral disturbance: Secondary | ICD-10-CM | POA: Diagnosis not present

## 2018-09-02 DIAGNOSIS — K429 Umbilical hernia without obstruction or gangrene: Secondary | ICD-10-CM | POA: Diagnosis not present

## 2018-09-02 HISTORY — PX: ROBOT ASSISTED INGUINAL HERNIA REPAIR: SHX6561

## 2018-09-02 LAB — SARS CORONAVIRUS 2 BY RT PCR (HOSPITAL ORDER, PERFORMED IN ~~LOC~~ HOSPITAL LAB): SARS Coronavirus 2: NEGATIVE

## 2018-09-02 LAB — URINE DRUG SCREEN, QUALITATIVE (ARMC ONLY)
Amphetamines, Ur Screen: NOT DETECTED
Barbiturates, Ur Screen: NOT DETECTED
Benzodiazepine, Ur Scrn: NOT DETECTED
Cannabinoid 50 Ng, Ur ~~LOC~~: NOT DETECTED
Cocaine Metabolite,Ur ~~LOC~~: NOT DETECTED
MDMA (Ecstasy)Ur Screen: NOT DETECTED
Methadone Scn, Ur: NOT DETECTED
Opiate, Ur Screen: NOT DETECTED
Phencyclidine (PCP) Ur S: NOT DETECTED
Tricyclic, Ur Screen: NOT DETECTED

## 2018-09-02 SURGERY — ROBOT ASSISTED INGUINAL HERNIA REPAIR
Anesthesia: General | Laterality: Right

## 2018-09-02 MED ORDER — ROCURONIUM BROMIDE 50 MG/5ML IV SOLN
INTRAVENOUS | Status: AC
  Filled 2018-09-02: qty 1

## 2018-09-02 MED ORDER — ONDANSETRON HCL 4 MG/2ML IJ SOLN
INTRAMUSCULAR | Status: AC
  Filled 2018-09-02: qty 2

## 2018-09-02 MED ORDER — SUGAMMADEX SODIUM 200 MG/2ML IV SOLN
INTRAVENOUS | Status: AC
  Filled 2018-09-02: qty 2

## 2018-09-02 MED ORDER — CHLORHEXIDINE GLUCONATE CLOTH 2 % EX PADS: 6.0000 | MEDICATED_PAD | Freq: Once | CUTANEOUS | Status: DC

## 2018-09-02 MED ORDER — ROCURONIUM BROMIDE 50 MG/5ML IV SOLN
INTRAVENOUS | Status: AC
  Filled 2018-09-02: qty 2

## 2018-09-02 MED ORDER — SODIUM CHLORIDE 0.9 % IV SOLN
INTRAVENOUS | Status: DC | PRN
  Administered 2018-09-02: 10:00:00 25 ug/min via INTRAVENOUS

## 2018-09-02 MED ORDER — PROPOFOL 10 MG/ML IV BOLUS
INTRAVENOUS | Status: AC
  Filled 2018-09-02: qty 20

## 2018-09-02 MED ORDER — FENTANYL CITRATE (PF) 100 MCG/2ML IJ SOLN: 25.0000 ug | INTRAMUSCULAR | Status: DC | PRN

## 2018-09-02 MED ORDER — SUGAMMADEX SODIUM 200 MG/2ML IV SOLN
INTRAVENOUS | Status: DC | PRN
  Administered 2018-09-02: 200 mg via INTRAVENOUS

## 2018-09-02 MED ORDER — ACETAMINOPHEN 500 MG PO TABS
ORAL_TABLET | ORAL | Status: AC
  Administered 2018-09-02: 09:00:00 1000 mg via ORAL
  Filled 2018-09-02: qty 2

## 2018-09-02 MED ORDER — LIDOCAINE HCL (CARDIAC) PF 100 MG/5ML IV SOSY
PREFILLED_SYRINGE | INTRAVENOUS | Status: DC | PRN
  Administered 2018-09-02: 100 mg via INTRAVENOUS

## 2018-09-02 MED ORDER — FAMOTIDINE 20 MG PO TABS
ORAL_TABLET | ORAL | Status: AC
  Administered 2018-09-02: 09:00:00 20 mg via ORAL
  Filled 2018-09-02: qty 1

## 2018-09-02 MED ORDER — OXYCODONE HCL 5 MG/5ML PO SOLN: 5.0000 mg | Freq: Once | ORAL | Status: DC | PRN

## 2018-09-02 MED ORDER — FENTANYL CITRATE (PF) 100 MCG/2ML IJ SOLN
INTRAMUSCULAR | Status: DC | PRN
  Administered 2018-09-02 (×2): 50 ug via INTRAVENOUS
  Administered 2018-09-02: 25 ug via INTRAVENOUS

## 2018-09-02 MED ORDER — HYDROCODONE-ACETAMINOPHEN 5-325 MG PO TABS: 1.0000 | ORAL_TABLET | Freq: Four times a day (QID) | ORAL | 0 refills | Status: DC | PRN

## 2018-09-02 MED ORDER — CEFAZOLIN SODIUM-DEXTROSE 2-4 GM/100ML-% IV SOLN
2.0000 g | INTRAVENOUS | Status: AC
  Administered 2018-09-02: 10:00:00 2 g via INTRAVENOUS

## 2018-09-02 MED ORDER — FENTANYL CITRATE (PF) 100 MCG/2ML IJ SOLN
INTRAMUSCULAR | Status: AC
  Filled 2018-09-02: qty 2

## 2018-09-02 MED ORDER — BUPIVACAINE LIPOSOME 1.3 % IJ SUSP
INTRAMUSCULAR | Status: AC
  Filled 2018-09-02: qty 20

## 2018-09-02 MED ORDER — DEXAMETHASONE SODIUM PHOSPHATE 10 MG/ML IJ SOLN
INTRAMUSCULAR | Status: AC
  Filled 2018-09-02: qty 1

## 2018-09-02 MED ORDER — OXYCODONE HCL 5 MG PO TABS: 5.0000 mg | ORAL_TABLET | Freq: Once | ORAL | Status: DC | PRN

## 2018-09-02 MED ORDER — BUPIVACAINE-EPINEPHRINE (PF) 0.25% -1:200000 IJ SOLN
INTRAMUSCULAR | Status: AC
  Filled 2018-09-02: qty 30

## 2018-09-02 MED ORDER — DEXAMETHASONE SODIUM PHOSPHATE 10 MG/ML IJ SOLN
INTRAMUSCULAR | Status: DC | PRN
  Administered 2018-09-02: 5 mg via INTRAVENOUS

## 2018-09-02 MED ORDER — GLYCOPYRROLATE 0.2 MG/ML IJ SOLN
INTRAMUSCULAR | Status: DC | PRN
  Administered 2018-09-02: 0.2 mg via INTRAVENOUS

## 2018-09-02 MED ORDER — CEFAZOLIN SODIUM-DEXTROSE 2-4 GM/100ML-% IV SOLN
INTRAVENOUS | Status: AC
  Filled 2018-09-02: qty 100

## 2018-09-02 MED ORDER — BUPIVACAINE-EPINEPHRINE 0.25% -1:200000 IJ SOLN
INTRAMUSCULAR | Status: DC | PRN
  Administered 2018-09-02: 30 mL

## 2018-09-02 MED ORDER — ONDANSETRON HCL 4 MG/2ML IJ SOLN
INTRAMUSCULAR | Status: DC | PRN
  Administered 2018-09-02: 4 mg via INTRAVENOUS

## 2018-09-02 MED ORDER — ESMOLOL HCL 100 MG/10ML IV SOLN
INTRAVENOUS | Status: AC
  Filled 2018-09-02: qty 10

## 2018-09-02 MED ORDER — LACTATED RINGERS IV SOLN
INTRAVENOUS | Status: DC
  Administered 2018-09-02 (×2): via INTRAVENOUS

## 2018-09-02 MED ORDER — METHYLENE BLUE 0.5 % INJ SOLN
INTRAVENOUS | Status: DC | PRN
  Administered 2018-09-02: 10 mL

## 2018-09-02 MED ORDER — PROPOFOL 10 MG/ML IV BOLUS
INTRAVENOUS | Status: DC | PRN
  Administered 2018-09-02: 100 mg via INTRAVENOUS

## 2018-09-02 MED ORDER — ESMOLOL HCL 100 MG/10ML IV SOLN
INTRAVENOUS | Status: DC | PRN
  Administered 2018-09-02: 10 mg via INTRAVENOUS
  Administered 2018-09-02: 20 mg via INTRAVENOUS
  Administered 2018-09-02 (×2): 10 mg via INTRAVENOUS

## 2018-09-02 MED ORDER — METHYLENE BLUE 0.5 % INJ SOLN
INTRAVENOUS | Status: AC
  Filled 2018-09-02: qty 10

## 2018-09-02 MED ORDER — LIDOCAINE HCL (PF) 2 % IJ SOLN
INTRAMUSCULAR | Status: AC
  Filled 2018-09-02: qty 10

## 2018-09-02 MED ORDER — ACETAMINOPHEN 500 MG PO TABS
1000.0000 mg | ORAL_TABLET | ORAL | Status: AC
  Administered 2018-09-02: 09:00:00 1000 mg via ORAL

## 2018-09-02 MED ORDER — GABAPENTIN 300 MG PO CAPS
ORAL_CAPSULE | ORAL | Status: AC
  Administered 2018-09-02: 09:00:00 300 mg via ORAL
  Filled 2018-09-02: qty 1

## 2018-09-02 MED ORDER — FAMOTIDINE 20 MG PO TABS
20.0000 mg | ORAL_TABLET | Freq: Once | ORAL | Status: AC
  Administered 2018-09-02: 09:00:00 20 mg via ORAL

## 2018-09-02 MED ORDER — GABAPENTIN 300 MG PO CAPS
300.0000 mg | ORAL_CAPSULE | ORAL | Status: AC
  Administered 2018-09-02: 09:00:00 300 mg via ORAL

## 2018-09-02 MED ORDER — SODIUM CHLORIDE 0.9 % IR SOLN
Status: DC | PRN
  Administered 2018-09-02: 500 mL via INTRAVESICAL

## 2018-09-02 MED ORDER — ROCURONIUM BROMIDE 100 MG/10ML IV SOLN
INTRAVENOUS | Status: DC | PRN
  Administered 2018-09-02: 50 mg via INTRAVENOUS
  Administered 2018-09-02 (×3): 10 mg via INTRAVENOUS

## 2018-09-02 SURGICAL SUPPLY — 50 items
CANISTER SUCT 1200ML W/VALVE (MISCELLANEOUS) ×3 IMPLANT
CHLORAPREP W/TINT 26 (MISCELLANEOUS) ×3 IMPLANT
COVER TIP SHEARS 8 DVNC (MISCELLANEOUS) ×1 IMPLANT
COVER TIP SHEARS 8MM DA VINCI (MISCELLANEOUS) ×2
COVER WAND RF STERILE (DRAPES) ×3 IMPLANT
DEFOGGER SCOPE WARMER CLEARIFY (MISCELLANEOUS) ×3 IMPLANT
DERMABOND ADVANCED (GAUZE/BANDAGES/DRESSINGS) ×4
DERMABOND ADVANCED .7 DNX12 (GAUZE/BANDAGES/DRESSINGS) ×2 IMPLANT
DRAPE 3/4 80X56 (DRAPES) ×6 IMPLANT
DRAPE ARM DVNC X/XI (DISPOSABLE) ×3 IMPLANT
DRAPE COLUMN DVNC XI (DISPOSABLE) ×1 IMPLANT
DRAPE DA VINCI XI ARM (DISPOSABLE) ×6
DRAPE DA VINCI XI COLUMN (DISPOSABLE) ×2
ELECT CAUTERY BLADE 6.4 (BLADE) ×3 IMPLANT
ELECT REM PT RETURN 9FT ADLT (ELECTROSURGICAL) ×3
ELECTRODE REM PT RTRN 9FT ADLT (ELECTROSURGICAL) ×1 IMPLANT
GLOVE BIO SURGEON STRL SZ7 (GLOVE) ×12 IMPLANT
GOWN STRL REUS W/ TWL LRG LVL3 (GOWN DISPOSABLE) ×4 IMPLANT
GOWN STRL REUS W/TWL LRG LVL3 (GOWN DISPOSABLE) ×8
IRRIGATION STRYKERFLOW (MISCELLANEOUS) ×1 IMPLANT
IRRIGATOR STRYKERFLOW (MISCELLANEOUS) ×3
IV NS 1000ML (IV SOLUTION)
IV NS 1000ML BAXH (IV SOLUTION) IMPLANT
KIT PINK PAD W/HEAD ARE REST (MISCELLANEOUS) ×3 IMPLANT
KIT PINK PAD W/HEAD ARM REST (MISCELLANEOUS) ×1 IMPLANT
LABEL OR SOLS (LABEL) ×3 IMPLANT
MESH 3DMAX 4X6 RT LRG (Mesh General) ×3 IMPLANT
NEEDLE HYPO 22GX1.5 SAFETY (NEEDLE) ×3 IMPLANT
OBTURATOR OPTICAL STANDARD 8MM (TROCAR) ×2
OBTURATOR OPTICAL STND 8 DVNC (TROCAR) ×1
OBTURATOR OPTICALSTD 8 DVNC (TROCAR) ×1 IMPLANT
PACK LAP CHOLECYSTECTOMY (MISCELLANEOUS) ×3 IMPLANT
PENCIL ELECTRO HAND CTR (MISCELLANEOUS) ×3 IMPLANT
SEAL CANN UNIV 5-8 DVNC XI (MISCELLANEOUS) ×3 IMPLANT
SEAL XI 5MM-8MM UNIVERSAL (MISCELLANEOUS) ×6
SLEEVE PROTECTION STRL DISP (MISCELLANEOUS) ×3 IMPLANT
SOLUTION ELECTROLUBE (MISCELLANEOUS) ×3 IMPLANT
SPONGE LAP 18X18 RF (DISPOSABLE) ×3 IMPLANT
SPONGE LAP 4X18 RFD (DISPOSABLE) ×3 IMPLANT
SUT ETHIBOND NAB MO 7 #0 18IN (SUTURE) ×3 IMPLANT
SUT MNCRL 4-0 (SUTURE) ×4
SUT MNCRL 4-0 27XMFL (SUTURE) ×2
SUT MNCRL AB 4-0 PS2 18 (SUTURE) ×3 IMPLANT
SUT VIC AB 2-0 SH 27 (SUTURE) ×4
SUT VIC AB 2-0 SH 27XBRD (SUTURE) ×2 IMPLANT
SUT VICRYL 0 AB UR-6 (SUTURE) ×6 IMPLANT
SUT VLOC 90 S/L VL9 GS22 (SUTURE) ×3 IMPLANT
SUTURE MNCRL 4-0 27XMF (SUTURE) ×2 IMPLANT
TROCAR BALLN GELPORT 12X130M (ENDOMECHANICALS) ×3 IMPLANT
TUBING EVAC SMOKE HEATED PNEUM (TUBING) ×3 IMPLANT

## 2018-09-02 NOTE — Anesthesia Procedure Notes (Signed)
Procedure Name: Intubation Date/Time: 09/02/2018 10:09 AM Performed by: Lowry Bowl, CRNA Pre-anesthesia Checklist: Patient identified, Emergency Drugs available, Suction available and Patient being monitored Patient Re-evaluated:Patient Re-evaluated prior to induction Oxygen Delivery Method: Circle system utilized Preoxygenation: Pre-oxygenation with 100% oxygen Induction Type: IV induction Ventilation: Mask ventilation without difficulty Laryngoscope Size: McGraph and 4 Grade View: Grade I Tube type: Oral Tube size: 7.5 mm Number of attempts: 1 Airway Equipment and Method: Stylet,  Oral airway and Video-laryngoscopy Placement Confirmation: ETT inserted through vocal cords under direct vision,  positive ETCO2 and breath sounds checked- equal and bilateral Secured at: 22 cm Tube secured with: Tape Dental Injury: Teeth and Oropharynx as per pre-operative assessment

## 2018-09-02 NOTE — Interval H&P Note (Signed)
History and Physical Interval Note:  09/02/2018 9:41 AM  Jeremy [name]  has presented today for surgery, with the diagnosis of K40.90 RIGHT INGUINALHERNIA U59.9 UMBILICAL HERNIA.  The various methods of treatment have been discussed with the patient and family. After consideration of risks, benefits and other options for treatment, the patient has consented to  Procedure(s): [place], RIGHT (Right) as a surgical intervention.  The patient's history has been reviewed, patient examined, no change in status, stable for surgery.  I have reviewed the patient's chart and labs.  Questions were answered to the patient's satisfaction.     [place]

## 2018-09-02 NOTE — Transfer of Care (Signed)
Immediate Anesthesia Transfer of Care Note  Patient: Jeremy [name]  Procedure(s) Performed: ROBOT ASSISTED INGUINAL HERNIA REPAIR AND UMBILICAL HERNIA, RIGHT (Right )  Patient Location: PACU  Anesthesia Type:General  Level of Consciousness: drowsy and patient cooperative  Airway & Oxygen Therapy: Patient Spontanous Breathing and Patient connected to face mask oxygen  Post-op Assessment: Report given to RN and Post -op Vital signs reviewed and stable  Post vital signs: Reviewed and stable  Last Vitals:  Vitals Value Taken Time  BP    Temp    Pulse 95 09/02/18 1316  Resp 12 09/02/18 1316  SpO2 100 % 09/02/18 1316  Vitals shown include unvalidated device data.  Last Pain:  Vitals:   09/02/18 0855  TempSrc: Temporal  PainSc: 0-No pain         Complications: No apparent anesthesia complications

## 2018-09-02 NOTE — Anesthesia Preprocedure Evaluation (Addendum)
Anesthesia Evaluation  Patient identified by MRN, date of birth, ID band Patient awake and Patient confused    Reviewed: Allergy & Precautions, H&P , NPO status , Patient's Chart, lab work & pertinent test results  History of Anesthesia Complications Negative for: history of anesthetic complications  Airway Mallampati: III  TM Distance: >3 FB Neck ROM: limited    Dental  (+) Chipped, Poor Dentition, Missing   Pulmonary COPD, former smoker,           Cardiovascular hypertension, (-) Past MI      Neuro/Psych  Headaches, Seizures -,  negative psych ROS   GI/Hepatic negative GI ROS, Neg liver ROS, neg GERD  ,  Endo/Other  negative endocrine ROS  Renal/GU      Musculoskeletal   Abdominal   Peds  Hematology negative hematology ROS (+)   Anesthesia Other Findings Past Medical History: 2019: Cancer ([place])     Comment:  prostate /radiation No date: Dementia ([place]) No date: Hypertension No date: Seizures ([place])     Comment:  LAST 6 MO  AGO  Past Surgical History: No date: none 12/15/2017: PROSTATE BIOPSY; N/A     Comment:  Procedure: BIOPSY TRANSRECTAL ULTRASONIC PROSTATE               (TUBP);  Surgeon: [name], MD;  Location: [facility]               ORS;  Service: Urology;  Laterality: N/A;     Reproductive/Obstetrics negative OB ROS                            Anesthesia Physical Anesthesia Plan  ASA: III  Anesthesia Plan: General ETT   Post-op Pain Management:    Induction: Intravenous  PONV Risk Score and Plan: Ondansetron, Dexamethasone, Midazolam and Treatment may vary due to age or medical condition  Airway Management Planned: Oral ETT  Additional Equipment:   Intra-op Plan:   Post-operative Plan: Extubation in OR  Informed Consent: I have reviewed the patients History and Physical, chart, labs and discussed the procedure including the risks, benefits and alternatives  for the proposed anesthesia with the patient or authorized representative who has indicated his/her understanding and acceptance.     Dental Advisory Given  Plan Discussed with: Anesthesiologist, CRNA and Surgeon  Anesthesia Plan Comments: (Phone consent from brother, Jenny Reichmann at  [phone]  Brother consented for risks of anesthesia including but not limited to:  - adverse reactions to medications - damage to teeth, lips or other oral mucosa - sore throat or hoarseness - Damage to heart, brain, lungs or loss of life   He voiced understanding.)        Anesthesia Quick Evaluation

## 2018-09-02 NOTE — OR Nursing (Signed)
Patient's brother Jenny Reichmann called for consent with Idamae Lusher RN and myself.  Dr Amie Critchley spoke with him as well.   Consent obtained.

## 2018-09-02 NOTE — Anesthesia Post-op Follow-up Note (Signed)
Anesthesia QCDR form completed.        

## 2018-09-02 NOTE — Op Note (Signed)
Robotic assisted Laparoscopic Transabdominal Right Inguinal Hernia Repair with 3 D large Mesh, Umbilical hernia repair       Pre-operative Diagnosis:  Giant Right Inguinal Hernia   Post-operative Diagnosis: Same   Procedure: Robotic  Laparoscopic  repair of Right inguinal hernia and Umbilical hernia repair   Surgeon: Caroleen Hamman, MD FACS   Anesthesia: Gen. with endotracheal tube   Findings: Right Indirect giant inguinal hernia with small bowel. Massive sac requiring  Transection. To made sure we were not close to the urinary bladder or injuries were made to it, the foley was distended with saline and methylene blue. No evidence of Any extravasation to suggest bladder injuries.       2 cm UH reducible  Procedure Details  The patient was seen again in the Holding Room. The benefits, complications, treatment options, and expected outcomes were discussed with the patient. The risks of bleeding, infection, recurrence of symptoms, failure to resolve symptoms, recurrence of hernia, ischemic orchitis, chronic pain syndrome or neuroma, were discussed again. The likelihood of improving the patient's symptoms with return to their baseline status is good.  The patient and/or family concurred with the proposed plan, giving informed consent.  The patient was taken to Operating Room, identified  and the procedure verified as Laparoscopic Inguinal Hernia Repair. Laterality confirmed.  A Time Out was held and the above information confirmed.   Prior to the induction of general anesthesia, antibiotic prophylaxis was administered. VTE prophylaxis was in place. General endotracheal anesthesia was then administered and tolerated well. After the induction, the abdomen was prepped with Chloraprep and draped in the sterile fashion. The patient was positioned in the supine position. Foley was placed by RN.     Supraumbilical incision created and cut down technique used to enter the abdominal cavity.  Hernia was  identifies and sac excisied Fascia elevated and incised and hasson trochar placed. Pneumoperitoneum obtained w/o HD changes. No evidence of bowel injuries.  Two 8 mm placed under direct vision. The laparoscopy revealed large indirect defect on the right. I inserted the needles and the mesh. The robot was brought ot the table and docked in the standard fashion, no collision between arms was observed. Instruments were kept under direct view at all times. We started on the right side where a peritoneal flap was created. The small bowel was reduced  and dissected free from adjacent structures. We preserved the vas and the vessels. The sac was massive and attached to adjacent structucture, I worked on the sac for 45 minutes and was unable to fully reduce the sac.  Before cutting the sac I instilled saline and methylene blue within the bladder to make sure there was no evidence of any bladder within the sac.  We confirmed that there was no evidence of any extravasation of methylene blue on that the bladder was fully away from the hernia sac.  After performing this I excised the hernia sac and also was able to close it with a figure-of-eight 2 0 VLock suture  Once dissection was completed a large 3D mesh was placed and secured with two interrupted vicryl attached to the pubic tubercle. There was good coverage of the direct, indirect and femoral spaces. The flap was closed with v lock suture.   Second look revealed no complications or injuries.    Once assuring that hemostasis was adequate the ports were removed. The umbilical hernia defect was closed with multiple interrupted 0 Ethibond sutures in the standard fashion. 4-0 subcuticular Monocryl was  used at all skin edges. Dermabond was placed.  Patient tolerated the procedure well. There were no complications. He was taken to the recovery room in stable condition.                 Caroleen Hamman, MD, FACS

## 2018-09-02 NOTE — Discharge Instructions (Addendum)
Laparoscopic Inguinal Hernia Repair, Adult, Care After This sheet gives you information about how to care for yourself after your procedure. Your health care provider may also give you more specific instructions. If you have problems or questions, contact your health care provider. What can I expect after the procedure? After the procedure, it is common to have:  Pain.  Swelling and bruising around the incision area.  Scrotal swelling, in men.  Some fluid or blood draining from your incisions. Follow these instructions at home: Incision care  Follow instructions from your health care provider about how to take care of your incisions. Make sure you: ? Wash your hands with soap and water before you change your bandage (dressing). If soap and water are not available, use hand sanitizer. ? Change your dressing as told by your health care provider. ? Leave stitches (sutures), skin glue, or adhesive strips in place. These skin closures may need to stay in place for 2 weeks or longer. If adhesive strip edges start to loosen and curl up, you may trim the loose edges. Do not remove adhesive strips completely unless your health care provider tells you to do that.  Check your incision area every day for signs of infection. Check for: ? More redness, swelling, or pain. ? More fluid or blood. ? Warmth. ? Pus or a bad smell.  Wear loose, soft clothing while your incisions heal. Driving  Do not drive or use heavy machinery while taking prescription pain medicine.  Do not drive for 24 hours if you were given a medicine to help you relax (sedative) during your procedure. Activity  Do not lift anything that is heavier than 10 lb (4.5 kg), or the limit that you are told, until your health care provider says that it is safe.  Ask your health care provider what activities are safe for you.A lot of activity during the first week after surgery can increase pain and swelling. For 1 week after your  procedure: ? Avoid activities that take a lot of effort, such as exercise or sports. ? You may walk and climb stairs as needed for daily activity, but avoid long walks or climbing stairs for exercise. Managing pain and swelling   Put ice on painful or swollen areas: ? Put ice in a plastic bag. ? Place a towel between your skin and the bag. ? Leave the ice on for 20 minutes, 2-3 times a day. General instructions  Do not take baths, swim, or use a hot tub until your health care provider approves. Ask your health care provider if you may take showers. You may only be allowed to take sponge baths.  Take over-the-counter and prescription medicines only as told by your health care provider.  To prevent or treat constipation while you are taking prescription pain medicine, your health care provider may recommend that you: ? Drink enough fluid to keep your urine pale yellow. ? Take over-the-counter or prescription medicines. ? Eat foods that are high in fiber, such as fresh fruits and vegetables, whole grains, and beans. ? Limit foods that are high in fat and processed sugars, such as fried and sweet foods.  Do not use any products that contain nicotine or tobacco, such as cigarettes and e-cigarettes. If you need help quitting, ask your health care provider.  Drink enough fluid to keep your urine pale yellow.  Keep all follow-up visits as told by your health care provider. This is important. Contact a health care provider if:  You  have more redness, swelling, or pain around your incisions or your groin area.  You have more swelling in your scrotum.  You have more fluid or blood coming from your incisions.  Your incisions feel warm to the touch.  You have severe pain and medicines do not help.  You have abdominal pain or swelling.  You cannot eat or drink without vomiting.  You cannot urinate or pass a bowel movement.  You faint.  You feel dizzy.  You have nausea and  vomiting.  You have a fever. Get help right away if:  You have pus or a bad smell coming from your incisions.  You have chest pain.  You have problems breathing. Summary  Pain, swelling, and bruising are common after the procedure.  Check your incision area every day for signs of infection, such as more redness, swelling, or pain.  Put ice on painful or swollen areas for 20 minutes, 2-3 times a day. This information is not intended to replace advice given to you by your health care provider. Make sure you discuss any questions you have with your health care provider. AMBULATORY SURGERY  DISCHARGE INSTRUCTIONS   1) The drugs that you were given will stay in your system until tomorrow so for the next 24 hours you should not:  A) Drive an automobile B) Make any legal decisions C) Drink any alcoholic beverage   2) You may resume regular meals tomorrow.  Today it is better to start with liquids and gradually work up to solid foods.  You may eat anything you prefer, but it is better to start with liquids, then soup and crackers, and gradually work up to solid foods.   3) Please notify your doctor immediately if you have any unusual bleeding, trouble breathing, redness and pain at the surgery site, drainage, fever, or pain not relieved by medication.    4) Additional Instructions:        Please contact your physician with any problems or Same Day Surgery at [phone], Monday through Friday 6 am to 4 pm, or [place] [facility] number at [phone]. Document Released: 05/15/2016 Document Revised: 02/06/2017 Document Reviewed: 05/15/2016 Elsevier Patient Education  2020 [employer].

## 2018-09-03 ENCOUNTER — Encounter: Payer: Self-pay | Admitting: Surgery

## 2018-09-03 NOTE — Anesthesia Postprocedure Evaluation (Signed)
Anesthesia Post Note  Patient: Jeremy [name]  Procedure(s) Performed: ROBOT ASSISTED INGUINAL HERNIA REPAIR AND UMBILICAL HERNIA, RIGHT (Right )  Patient location during evaluation: PACU Anesthesia Type: General Level of consciousness: awake and alert Pain management: pain level controlled Vital Signs Assessment: post-procedure vital signs reviewed and stable Respiratory status: spontaneous breathing, nonlabored ventilation and respiratory function stable Cardiovascular status: blood pressure returned to baseline and stable Postop Assessment: no apparent nausea or vomiting Anesthetic complications: no     Last Vitals:  Vitals:   09/02/18 1414 09/02/18 1514  BP: (!) 165/83 140/81  Pulse: (!) 111 98  Resp: 18 16  Temp: 36.9 C   SpO2: 94% 96%    Last Pain:  Vitals:   09/02/18 1514  TempSrc:   PainSc: 0-No pain                 Alphonsus Sias

## 2018-09-06 LAB — SURGICAL PATHOLOGY

## 2018-09-09 DIAGNOSIS — R569 Unspecified convulsions: Secondary | ICD-10-CM | POA: Diagnosis not present

## 2018-09-09 DIAGNOSIS — I10 Essential (primary) hypertension: Secondary | ICD-10-CM | POA: Diagnosis not present

## 2018-09-09 DIAGNOSIS — Z79899 Other long term (current) drug therapy: Secondary | ICD-10-CM | POA: Diagnosis not present

## 2018-09-09 DIAGNOSIS — F0390 Unspecified dementia without behavioral disturbance: Secondary | ICD-10-CM | POA: Diagnosis not present

## 2018-09-09 DIAGNOSIS — R7989 Other specified abnormal findings of blood chemistry: Secondary | ICD-10-CM | POA: Diagnosis not present

## 2018-09-13 DIAGNOSIS — R569 Unspecified convulsions: Secondary | ICD-10-CM | POA: Diagnosis not present

## 2018-09-13 DIAGNOSIS — E538 Deficiency of other specified B group vitamins: Secondary | ICD-10-CM | POA: Diagnosis not present

## 2018-09-15 ENCOUNTER — Ambulatory Visit (INDEPENDENT_AMBULATORY_CARE_PROVIDER_SITE_OTHER): Payer: Medicare HMO | Admitting: Surgery

## 2018-09-15 ENCOUNTER — Other Ambulatory Visit: Payer: Self-pay

## 2018-09-15 ENCOUNTER — Encounter: Payer: Self-pay | Admitting: Surgery

## 2018-09-15 VITALS — BP 113/69 | HR 75 | Temp 97.7°F | Ht 68.0 in | Wt 154.0 lb

## 2018-09-15 DIAGNOSIS — Z09 Encounter for follow-up examination after completed treatment for conditions other than malignant neoplasm: Secondary | ICD-10-CM

## 2018-09-15 NOTE — Patient Instructions (Signed)
Return as needed.The patient is aware to call back for any questions or concerns.  

## 2018-09-15 NOTE — Progress Notes (Signed)
S/p rob RIH Doing well Taking Po No pain, no fevers  voiding well  PE NAD Abd: incisions c/d/i, no infection. There is a residual hydrocele on right sac expected given the chronic and giant inguinal hernia. No infection, no recurrence  A/p Doing well Moderate right hydrocele not unexpected given chronicity and large sac I Would observe the hydrocele and it may take weeks to  months to resolve RTC prn

## 2018-10-18 DIAGNOSIS — E538 Deficiency of other specified B group vitamins: Secondary | ICD-10-CM | POA: Diagnosis not present

## 2018-10-21 ENCOUNTER — Encounter: Payer: Self-pay | Admitting: Urology

## 2018-10-21 ENCOUNTER — Other Ambulatory Visit: Payer: Medicare HMO

## 2018-10-24 DIAGNOSIS — R32 Unspecified urinary incontinence: Secondary | ICD-10-CM | POA: Diagnosis not present

## 2018-10-27 ENCOUNTER — Telehealth: Payer: Self-pay | Admitting: Urology

## 2018-10-27 NOTE — Telephone Encounter (Signed)
PT HAS BEEN APPROVED FOR 4 MONTH LUPRON [id number] 10-27-18 THRU 01-25-19 MICHELLE

## 2018-10-29 ENCOUNTER — Ambulatory Visit: Payer: Medicare HMO | Admitting: Urology

## 2018-10-29 ENCOUNTER — Encounter: Payer: Self-pay | Admitting: Urology

## 2018-11-04 ENCOUNTER — Other Ambulatory Visit: Payer: Self-pay

## 2018-11-05 ENCOUNTER — Ambulatory Visit
Admission: RE | Admit: 2018-11-05 | Discharge: 2018-11-05 | Disposition: A | Discharge status: Home / Self Care | Payer: Medicare HMO | Source: Ambulatory Visit | Attending: Radiation Oncology | Admitting: Radiation Oncology

## 2018-11-05 ENCOUNTER — Other Ambulatory Visit: Payer: Self-pay

## 2018-11-05 ENCOUNTER — Other Ambulatory Visit: Payer: Self-pay | Admitting: *Deleted

## 2018-11-05 ENCOUNTER — Inpatient Hospital Stay: Payer: Medicare HMO | Attending: Radiation Oncology

## 2018-11-05 DIAGNOSIS — C61 Malignant neoplasm of prostate: Secondary | ICD-10-CM | POA: Insufficient documentation

## 2018-11-05 DIAGNOSIS — Z923 Personal history of irradiation: Secondary | ICD-10-CM | POA: Insufficient documentation

## 2018-11-05 DIAGNOSIS — R197 Diarrhea, unspecified: Secondary | ICD-10-CM | POA: Insufficient documentation

## 2018-11-05 LAB — PSA: Prostatic Specific Antigen: 0.01 ng/mL (ref 0.00–4.00)

## 2018-11-05 NOTE — Progress Notes (Signed)
Radiation Oncology Follow up Note  Name: Jeremy [name]   Date:   11/05/2018 MRN:  [id number] DOB: 1945/09/06    This 73 y.o. 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  REFERRING PROVIDER: Idelle Crouch, MD  HPI: Patient is a 73 year old male resident of assisted living is now 4 months out having completed external beam radiation therapy to his prostate and pelvic nodes.  Gleason 8 (4+4) adenocarcinoma presenting with a PSA of 11.  Seen today in routine follow-up he is doing well he does have occasional diarrhea is not taking any Imodium or following any low residue diet.  He had his PSA tested today at that is pending.  He specifically [place] any increased urgency or frequency..  COMPLICATIONS OF TREATMENT: none  FOLLOW UP COMPLIANCE: keeps appointments   PHYSICAL EXAM:  There were no vitals taken for this visit. Well-developed well-nourished patient in NAD. HEENT reveals PERLA, EOMI, discs not visualized.  Oral cavity is clear. No oral mucosal lesions are identified. Neck is clear without evidence of cervical or supraclavicular adenopathy. Lungs are clear to A&P. Cardiac examination is essentially unremarkable with regular rate and rhythm without murmur rub or thrill. Abdomen is benign with no organomegaly or masses noted. Motor sensory and DTR levels are equal and symmetric in the upper and lower extremities. Cranial nerves II through XII are grossly intact. Proprioception is intact. No peripheral adenopathy or edema is identified. No motor or sensory levels are noted. Crude visual fields are within normal range.  RADIOLOGY RESULTS: No current films to review  PLAN: Present time we will check his PSA when it becomes available.  I have otherwise asked to see him back in 6 months for follow-up.  We will let the assisted living people know he can take  Imodium as needed.  Caregivers know to call at anytime with any concerns.  I would like to take this opportunity to thank you for allowing me to participate in the care of your patient.Noreene Filbert, MD

## 2018-11-26 ENCOUNTER — Other Ambulatory Visit: Payer: Self-pay

## 2018-11-26 ENCOUNTER — Ambulatory Visit (INDEPENDENT_AMBULATORY_CARE_PROVIDER_SITE_OTHER): Payer: Medicare HMO

## 2018-11-26 DIAGNOSIS — C61 Malignant neoplasm of prostate: Secondary | ICD-10-CM

## 2018-11-26 MED ORDER — LEUPROLIDE ACETATE (6 MONTH) 45 MG ~~LOC~~ KIT
45.0000 mg | PACK | Freq: Once | SUBCUTANEOUS | Status: AC
  Administered 2018-11-26: 45 mg via SUBCUTANEOUS

## 2018-11-26 NOTE — Progress Notes (Signed)
Eligard SubQ Injection   Due to Prostate Cancer patient is present today for a Eligard Injection.  Medication: Eligard 6 month Dose: 45 mg  Location: left upper arm  Lot: [id number] Exp: 06/2020  Patient tolerated well, no complications were noted  Performed by: Gordy [name], CMA (AAMA)  Follow up: 30mos

## 2018-12-24 DIAGNOSIS — E538 Deficiency of other specified B group vitamins: Secondary | ICD-10-CM | POA: Diagnosis not present

## 2019-01-12 DIAGNOSIS — R32 Unspecified urinary incontinence: Secondary | ICD-10-CM | POA: Diagnosis not present

## 2019-01-24 DIAGNOSIS — E538 Deficiency of other specified B group vitamins: Secondary | ICD-10-CM | POA: Diagnosis not present

## 2019-01-26 ENCOUNTER — Other Ambulatory Visit: Payer: Medicare HMO

## 2019-01-26 ENCOUNTER — Other Ambulatory Visit: Payer: Self-pay

## 2019-01-26 DIAGNOSIS — C61 Malignant neoplasm of prostate: Secondary | ICD-10-CM

## 2019-01-27 LAB — PSA: Prostate Specific Ag, Serum: <0.1 ng/mL (ref 0.0–4.0)

## 2019-02-02 ENCOUNTER — Ambulatory Visit (INDEPENDENT_AMBULATORY_CARE_PROVIDER_SITE_OTHER): Payer: Medicare HMO | Admitting: Urology

## 2019-02-02 ENCOUNTER — Other Ambulatory Visit: Payer: Self-pay

## 2019-02-02 ENCOUNTER — Encounter: Payer: Self-pay | Admitting: Urology

## 2019-02-02 VITALS — BP 110/67 | HR 91 | Ht 68.0 in | Wt 159.6 lb

## 2019-02-02 DIAGNOSIS — C61 Malignant neoplasm of prostate: Secondary | ICD-10-CM

## 2019-02-02 NOTE — Progress Notes (Signed)
02/02/2019 11:18 AM   Jeremy [name] Nov 21, 1945 [id number]  Referring provider: Idelle Crouch, MD [facility] [place],  [place] [address]  Chief Complaint  Patient presents with  . Follow-up    Urologic history: 1. cT1c high risk prostate cancer  - diagnosed October 2019; PSA 11; 38 cc  -Multiple cores positive Gleason 3+4, 4+3 and 4+4 -Treated IMRT + ADT completed 5/20   HPI: 73 y.o. male presents for prostate cancer follow-up.  He presents with his group home caregiver.  Overall he is doing well.  He has baseline nocturia 3-4 which is not bothersome.  He saw Dr. Baruch Gouty September 2020 and PSA was 0.01.  PSA 01/2019 was <0.1.  His last Eligard injection was October 2020.  PMH: Past Medical History:  Diagnosis Date  . Cancer ([place]) 2019   prostate /radiation  . Dementia ([place])   . Hypertension   . Seizures ([place])    LAST 6 MO  AGO    Surgical History: Past Surgical History:  Procedure Laterality Date  . none    . PROSTATE BIOPSY N/A 12/15/2017   Procedure: BIOPSY TRANSRECTAL ULTRASONIC PROSTATE (TUBP);  Surgeon: [name], MD;  Location: [facility] ORS;  Service: Urology;  Laterality: N/A;  . ROBOT ASSISTED INGUINAL HERNIA REPAIR Right 09/02/2018   Procedure: ROBOT ASSISTED INGUINAL HERNIA REPAIR AND UMBILICAL HERNIA, RIGHT;  Surgeon: Jules Husbands, MD;  Location: [facility] ORS;  Service: General;  Laterality: Right;    Home Medications:  Allergies as of 02/02/2019   No Known Allergies     Medication List       Accurate as of February 02, 2019 11:18 AM. If you have any questions, ask your nurse or doctor.        acetaminophen 325 MG tablet Commonly known as: TYLENOL Take 650 mg by mouth every 4 (four) hours as needed for moderate pain, fever or headache.   bismuth subsalicylate [id number] suspension Commonly known as: PEPTO BISMOL Take 15 mLs by mouth every 4 (four) hours as needed (for nausea).   Benetta Spar  200-200-20 MG/5ML suspension Generic drug: alum & mag hydroxide-simeth Take 30 mLs by mouth daily as needed for indigestion or heartburn.   guaiFENesin 100 MG/5ML liquid Commonly known as: ROBITUSSIN Take 200 mg by mouth every 6 (six) hours as needed for cough.   HYDROcodone-acetaminophen 5-325 MG tablet Commonly known as: NORCO/VICODIN Take 1-2 tablets by mouth every 6 (six) hours as needed for moderate pain.   lamoTRIgine 25 MG tablet Commonly known as: LAMICTAL Take 50 mg by mouth 2 (two) times daily.   levETIRAcetam 750 MG tablet Commonly known as: KEPPRA Take 750 mg by mouth 2 (two) times daily.   levETIRAcetam 500 MG tablet Commonly known as: KEPPRA   loperamide 2 MG capsule Commonly known as: IMODIUM Take 2 mg by mouth 4 (four) times daily as needed for diarrhea or loose stools.   losartan-hydrochlorothiazide 100-25 MG tablet Commonly known as: HYZAAR Take 1 tablet by mouth daily.   magnesium hydroxide 400 MG/5ML suspension Commonly known as: MILK OF MAGNESIA Take 30 mLs by mouth daily as needed for mild constipation.   risperiDONE 2 MG tablet Commonly known as: RISPERDAL Take 1 mg by mouth 3 (three) times daily.       Allergies: No Known Allergies  Family History: No family history on file.  Social History:  reports that he has quit smoking. He quit after 25.00 years of use. He has never  used smokeless tobacco. He reports previous alcohol use. He reports previous drug use. Drugs: Cocaine and IV.  ROS: UROLOGY Frequent Urination?: Yes Hard to postpone urination?: No Burning/pain with urination?: No Get up at night to urinate?: No Leakage of urine?: No Urine stream starts and stops?: No Trouble starting stream?: No Do you have to strain to urinate?: No Blood in urine?: No Urinary tract infection?: No Sexually transmitted disease?: No Injury to kidneys or bladder?: No Painful intercourse?: No Weak stream?: No Erection problems?: No Penile pain?:  No  Gastrointestinal Nausea?: No Vomiting?: No Indigestion/heartburn?: No Diarrhea?: No Constipation?: No  Constitutional Fever: No Night sweats?: No Weight loss?: No Fatigue?: No  Skin Skin rash/lesions?: No Itching?: No  Eyes Blurred vision?: No Double vision?: No  Ears/Nose/Throat Sore throat?: No Sinus problems?: No  Hematologic/Lymphatic Swollen glands?: No Easy bruising?: No  Cardiovascular Leg swelling?: No Chest pain?: No  Respiratory Cough?: No Shortness of breath?: No  Endocrine Excessive thirst?: No  Musculoskeletal Back pain?: No Joint pain?: No  Neurological Headaches?: No Dizziness?: No  Psychologic Depression?: No Anxiety?: No  Physical Exam: There were no vitals taken for this visit.  Constitutional:  Alert and oriented, No acute distress. HEENT: [place] AT, moist mucus membranes.  Trachea midline, no masses. Cardiovascular: No clubbing, cyanosis, or edema. Respiratory: Normal respiratory effort, no increased work of breathing.   Assessment & Plan:    - T1c high risk prostate cancer Doing well status post IMRT.  He is due for an Eligard injection here 05/2018.  He has a follow-up appointment with radiation oncology March 2020 and will see him in October 2021 for office visit.  [name], [place] [address] [place],  [address] [phone]

## 2019-02-03 IMAGING — NM NM BONE WHOLE BODY
2 series · 6 of 6 positions shown · non-contrast
Comparison: None

Correlation: CT abdomen and pelvis 01/28/2018

CLINICAL DATA: High-risk prostate cancer, elevated PSA, staging

EXAM:
NUCLEAR MEDICINE WHOLE BODY BONE SCAN
TECHNIQUE: Whole body anterior and posterior images were obtained approximately
3 hours after intravenous injection of radiopharmaceutical.
RADIOPHARMACEUTICALS:  22.93 mCi Mechnetium-PPm MDP IV

[Series 1000: statics · 2.40mm/px · 2 acquisitions, 4 frames shown]
[im 1/2]
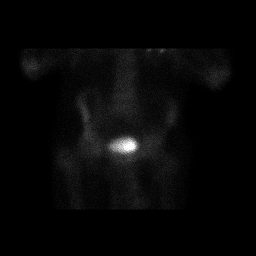
[im 1/2]
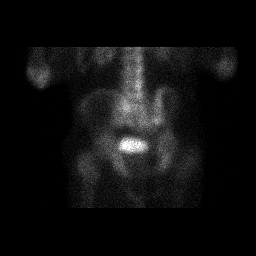
[im 2/2]
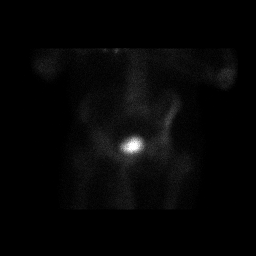
[im 2/2]
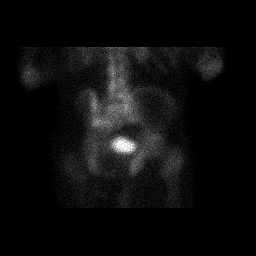

[Series 1000: 3 hr wholebody · 2.40mm/px · 2 of 2 frames shown]
[frame 1/2]
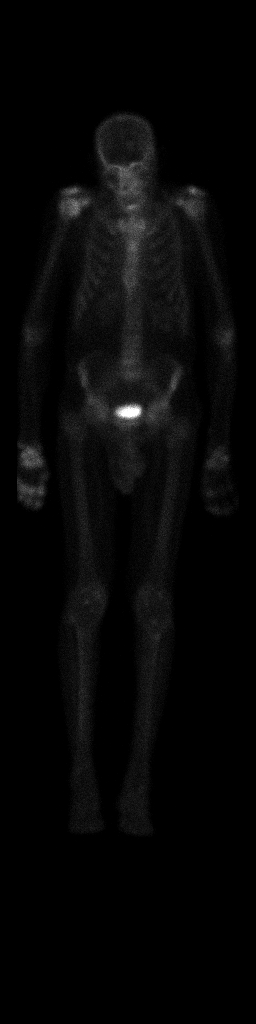
[frame 2/2]
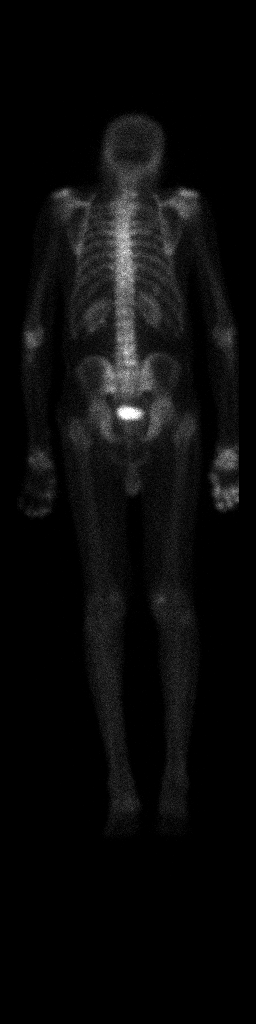

[6 of 6 positions shown; findings below may reference images not displayed]

FINDINGS: Uptake at the shoulders, elbows, and wrists, typically degenerative.

Diffusely increased tracer localization is seen in the RIGHT hand
versus LEFT, could reflect hyperemia to the RIGHT hand and wrist,
diffuse degenerative changes RIGHT hand, or complex regional pain
syndrome.

Uptake at the anterior mandible, typically related to dental
disease.

Uptake at the posterior RIGHT tenth rib corresponding to healing
fracture on CT.

Uptake in lower lumbar spine likely due to degenerative disc disease
changes.

No worrisome sites of abnormal osseous tracer accumulation are
identified to suggest osseous metastatic disease.

Expected urinary tract and soft tissue distribution of tracer.
IMPRESSION: Scattered degenerative and posttraumatic uptake as suspected above.

No definite scintigraphic evidence of osseous metastatic disease.

Question hyperemia to the RIGHT hand and wrist, which can be seen
with reactive hyperemia, complex regional pain syndrome, or
infection.

## 2019-02-03 IMAGING — CT CT ABD-PELV W/ CM
2 of 6 series · 15 of 46 positions shown, 17 images · IV contrast (APPLIED)
Comparison: CT the abdomen and pelvis 08/27/2015.

CLINICAL DATA: 72-year-old male with history of prostate cancer.
Staging examination.

EXAM:
CT ABDOMEN AND PELVIS WITH CONTRAST
TECHNIQUE: Multidetector CT imaging of the abdomen and pelvis was performed
using the standard protocol following bolus administration of
intravenous contrast.
CONTRAST:  100mL APKGU4-DXX IOPAMIDOL (APKGU4-DXX) INJECTION 61%

[Series 2: routine abd/pel with · axial · 0.70mm/px · z∈[-502,-42]mm · 12 of 208 slices shown, 14 images]
[im 9/208  soft-tissue]
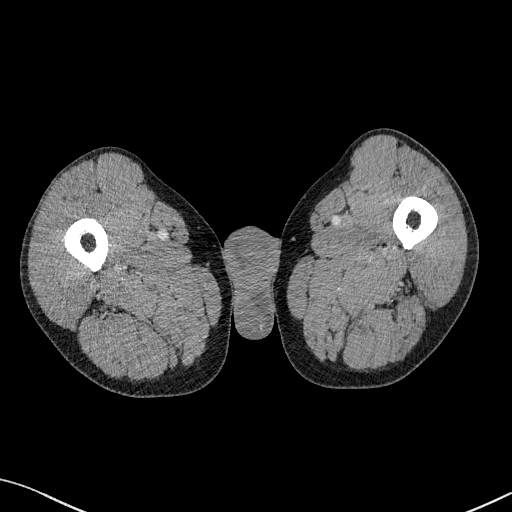
[im 9/208  bone]
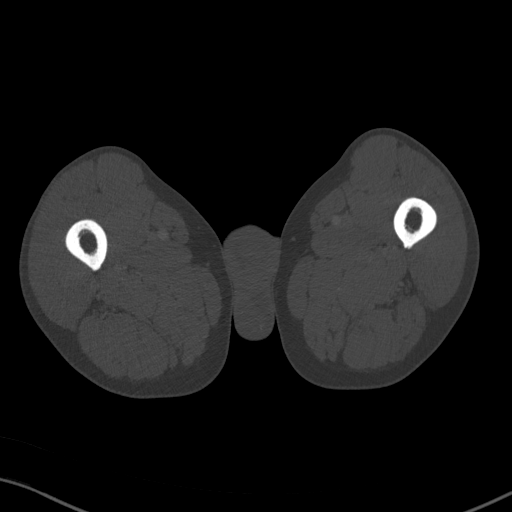
[im 26/208  soft-tissue]
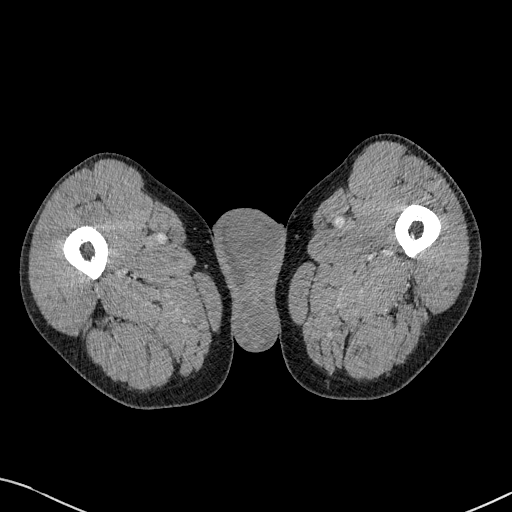
[im 44/208  soft-tissue]
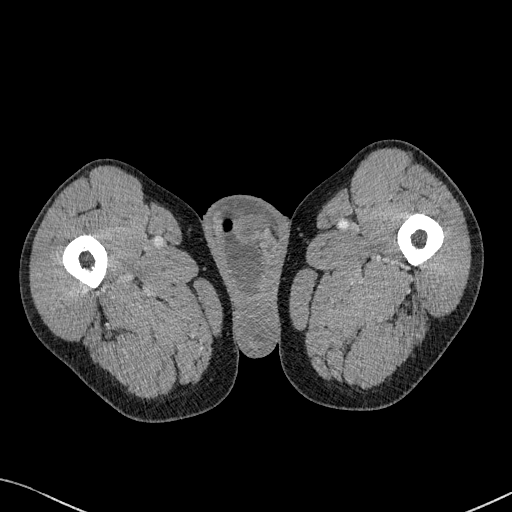
[im 61/208  soft-tissue]
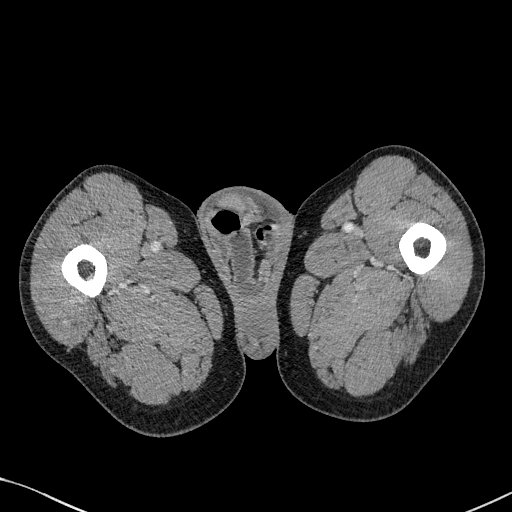
[im 78/208  soft-tissue]
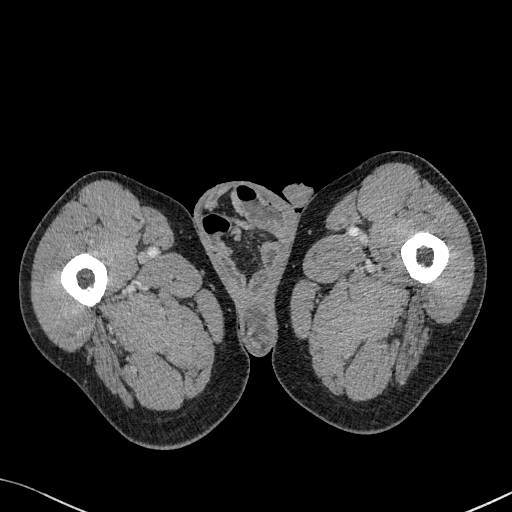
[im 95/208  soft-tissue]
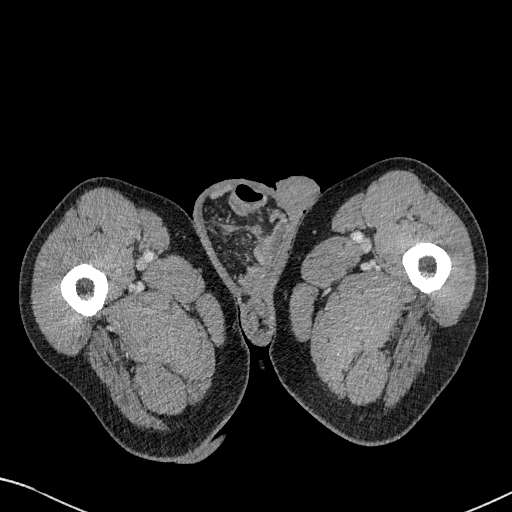
[im 113/208  soft-tissue]
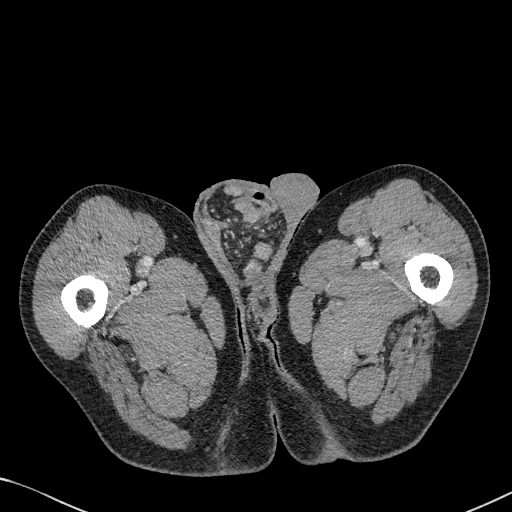
[im 130/208  soft-tissue]
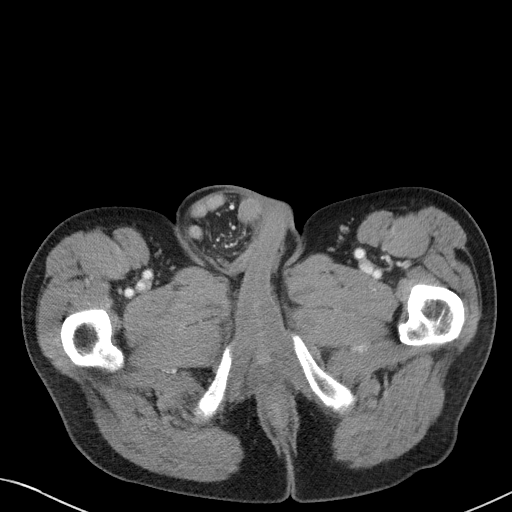
[im 147/208  soft-tissue]
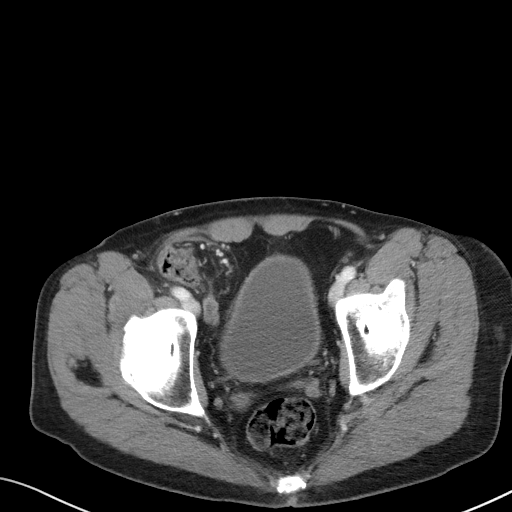
[im 147/208  bone]
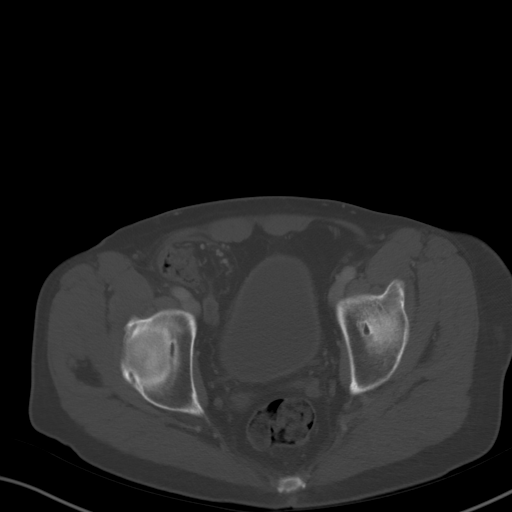
[im 164/208  soft-tissue]
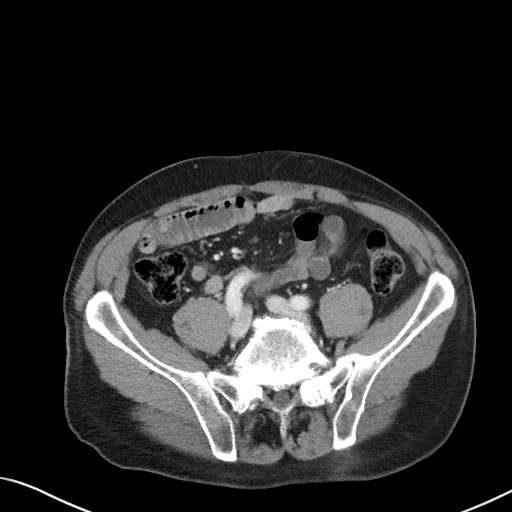
[im 182/208  soft-tissue]
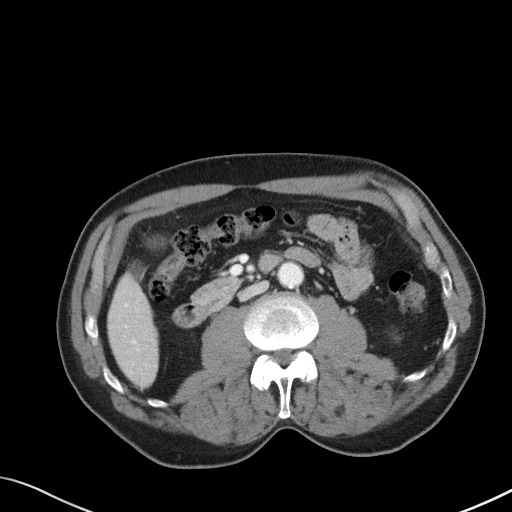
[im 199/208  soft-tissue]
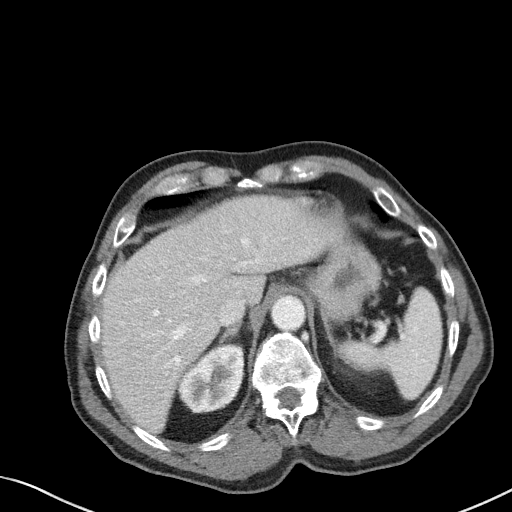

[Series 8: coronal st · coronal · 0.58mm/px · 3 of 87 slices shown]
[im 29/87  soft-tissue]
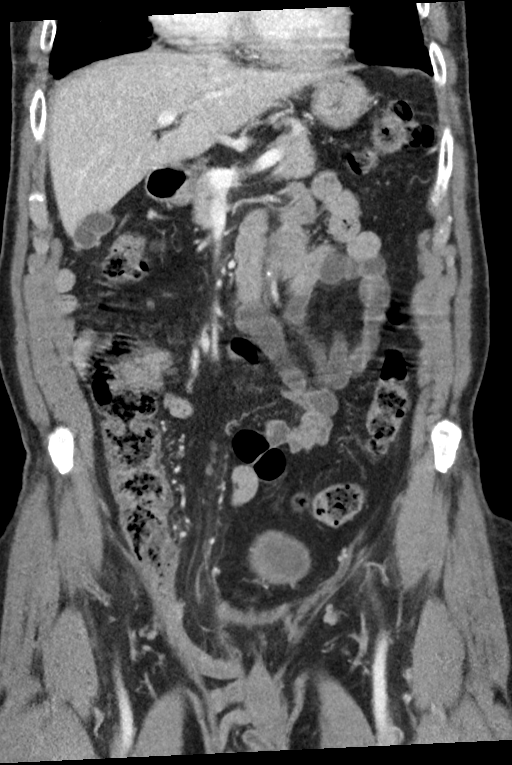
[im 39/87  soft-tissue]
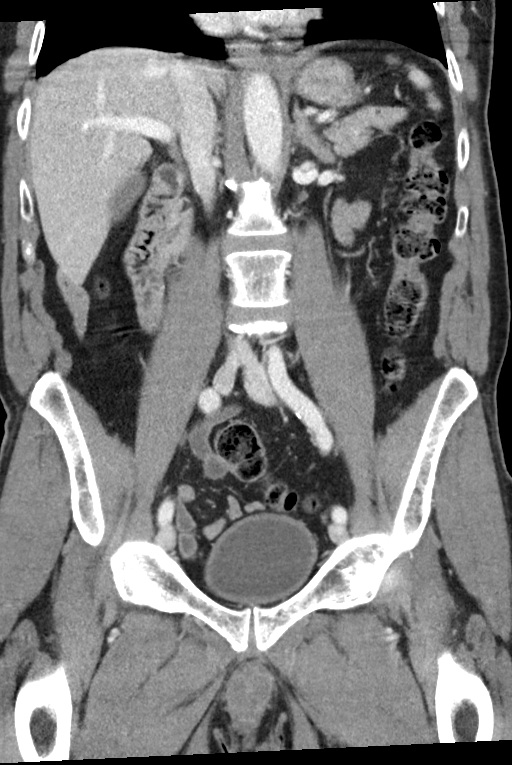
[im 48/87  soft-tissue]
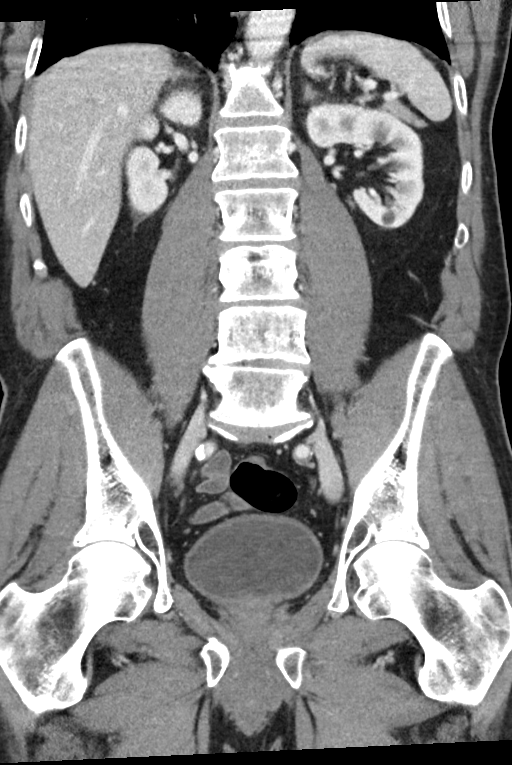

[15 of 46 positions shown; findings below may reference images not displayed]

FINDINGS: Lower chest: Unremarkable.

Hepatobiliary: No suspicious cystic or solid hepatic lesions. No
intra or extrahepatic biliary ductal dilatation. Gallbladder is
normal in appearance.

Pancreas: No pancreatic mass. No pancreatic ductal dilatation. No
pancreatic or peripancreatic fluid or inflammatory changes.

Spleen: Unremarkable.

Adrenals/Urinary Tract: Multiple subcentimeter low-attenuation
lesions in the kidneys bilaterally, too small to characterize, but
statistically likely to represent tiny cysts. Bilateral adrenal
glands are normal in appearance. No hydroureteronephrosis. Urinary
bladder is normal in appearance.

Stomach/Bowel: Normal appearance of the stomach. No pathologic
dilatation of small bowel or colon. Large right inguinoscrotal
hernia containing portions of the distal small bowel and
cecum/appendix. Normal appendix.

Vascular/Lymphatic: Aortic atherosclerosis, without evidence of
aneurysm or dissection in the abdominal or pelvic vasculature. No
lymphadenopathy noted in the abdomen or pelvis.

Reproductive: Prostate gland and seminal vesicles are grossly
unremarkable.

Other: Large right inguinoscrotal hernia containing multiple loops
of mid to distal small bowel, short segment of the cecum, and the
appendix. No significant volume of ascites. No pneumoperitoneum.

Musculoskeletal: There are no aggressive appearing lytic or blastic
lesions noted in the visualized portions of the skeleton.
IMPRESSION: 1. No definite findings to suggest metastatic disease in the abdomen
or pelvis. Prostate gland is grossly unremarkable in appearance.
2. Large right inguinoscrotal hernia containing portions of small
bowel and cecum/appendix. No associated bowel incarceration or
obstruction at this time.
3. Aortic atherosclerosis.

## 2019-02-09 DIAGNOSIS — R32 Unspecified urinary incontinence: Secondary | ICD-10-CM | POA: Diagnosis not present

## 2019-02-24 DIAGNOSIS — E538 Deficiency of other specified B group vitamins: Secondary | ICD-10-CM | POA: Diagnosis not present

## 2019-02-24 DIAGNOSIS — D513 Other dietary vitamin B12 deficiency anemia: Secondary | ICD-10-CM | POA: Diagnosis not present

## 2019-02-24 DIAGNOSIS — R569 Unspecified convulsions: Secondary | ICD-10-CM | POA: Diagnosis not present

## 2019-02-24 DIAGNOSIS — Z79899 Other long term (current) drug therapy: Secondary | ICD-10-CM | POA: Diagnosis not present

## 2019-02-24 DIAGNOSIS — D508 Other iron deficiency anemias: Secondary | ICD-10-CM | POA: Diagnosis not present

## 2019-02-24 DIAGNOSIS — I10 Essential (primary) hypertension: Secondary | ICD-10-CM | POA: Diagnosis not present

## 2019-03-02 DIAGNOSIS — R32 Unspecified urinary incontinence: Secondary | ICD-10-CM | POA: Diagnosis not present

## 2019-03-09 ENCOUNTER — Encounter: Payer: Self-pay | Admitting: *Deleted

## 2019-03-16 DIAGNOSIS — R569 Unspecified convulsions: Secondary | ICD-10-CM | POA: Diagnosis not present

## 2019-03-16 DIAGNOSIS — R2681 Unsteadiness on feet: Secondary | ICD-10-CM | POA: Diagnosis not present

## 2019-05-05 ENCOUNTER — Inpatient Hospital Stay: Payer: Medicare HMO | Attending: Radiation Oncology

## 2019-05-05 DIAGNOSIS — C61 Malignant neoplasm of prostate: Secondary | ICD-10-CM | POA: Insufficient documentation

## 2019-05-05 LAB — PSA: Prostatic Specific Antigen: <0.01 ng/mL (ref 0.00–4.00)

## 2019-05-12 ENCOUNTER — Ambulatory Visit: Payer: Medicare HMO | Admitting: Radiation Oncology

## 2019-05-19 ENCOUNTER — Ambulatory Visit: Payer: Medicare HMO | Admitting: Radiation Oncology

## 2019-05-20 ENCOUNTER — Ambulatory Visit: Payer: Medicare HMO | Attending: Radiation Oncology | Admitting: Radiation Oncology

## 2019-05-25 ENCOUNTER — Ambulatory Visit: Payer: Self-pay

## 2019-05-27 ENCOUNTER — Ambulatory Visit: Payer: Self-pay | Admitting: Physician Assistant

## 2019-05-31 ENCOUNTER — Other Ambulatory Visit: Payer: Self-pay

## 2019-05-31 ENCOUNTER — Ambulatory Visit (INDEPENDENT_AMBULATORY_CARE_PROVIDER_SITE_OTHER): Payer: Medicare HMO | Admitting: Physician Assistant

## 2019-05-31 VITALS — BP 113/70 | HR 75 | Ht 68.0 in | Wt 160.0 lb

## 2019-05-31 DIAGNOSIS — C61 Malignant neoplasm of prostate: Secondary | ICD-10-CM

## 2019-05-31 DIAGNOSIS — E538 Deficiency of other specified B group vitamins: Secondary | ICD-10-CM | POA: Diagnosis not present

## 2019-05-31 MED ORDER — LEUPROLIDE ACETATE (6 MONTH) 45 MG ~~LOC~~ KIT
45.0000 mg | PACK | Freq: Once | SUBCUTANEOUS | Status: AC
  Administered 2019-05-31: 45 mg via SUBCUTANEOUS

## 2019-05-31 MED ORDER — CALCIUM CARBONATE-VITAMIN D 600-400 MG-UNIT PO TABS: 1.0000 | ORAL_TABLET | Freq: Two times a day (BID) | ORAL | 11 refills | Status: DC

## 2019-05-31 NOTE — Patient Instructions (Signed)
I have prescribed a combined calcium-Vitamin D supplement today. He should take 2 tablets daily, I recommend dividing these up (1 in the morning, 1 in the evening).

## 2019-05-31 NOTE — Progress Notes (Signed)
Eligard SubQ Injection   Due to Prostate Cancer patient is present today for a Eligard Injection.  Medication: Eligard 6 month Dose: 45 mg  Location: left arm Lot: [id number] Exp: 12/2020  Patient tolerated well, no complications were noted  Performed by: Fonnie Jarvis, CMA  Per Dr. Bernardo Heater patient is to continue therapy for 2 years . Patient's next follow up was scheduled for 11/21/2020. This appointment was scheduled using wheel and given to patient today along with reminder continue on Vitamin D 800-1000iu and Calium 1000-1200mg  daily while on Androgen Deprivation Therapy.  Calcium and vitamin D supplements prescribed today by Debroah Loop, PA-C  for ease of access, as he lives in a residential facility.

## 2019-06-10 DIAGNOSIS — R32 Unspecified urinary incontinence: Secondary | ICD-10-CM | POA: Diagnosis not present

## 2019-07-01 DIAGNOSIS — D508 Other iron deficiency anemias: Secondary | ICD-10-CM | POA: Diagnosis not present

## 2019-07-01 DIAGNOSIS — E538 Deficiency of other specified B group vitamins: Secondary | ICD-10-CM | POA: Diagnosis not present

## 2019-07-01 DIAGNOSIS — Z125 Encounter for screening for malignant neoplasm of prostate: Secondary | ICD-10-CM | POA: Diagnosis not present

## 2019-07-01 DIAGNOSIS — I10 Essential (primary) hypertension: Secondary | ICD-10-CM | POA: Diagnosis not present

## 2019-07-01 DIAGNOSIS — R569 Unspecified convulsions: Secondary | ICD-10-CM | POA: Diagnosis not present

## 2019-07-01 DIAGNOSIS — Z0000 Encounter for general adult medical examination without abnormal findings: Secondary | ICD-10-CM | POA: Diagnosis not present

## 2019-07-01 DIAGNOSIS — Z79899 Other long term (current) drug therapy: Secondary | ICD-10-CM | POA: Diagnosis not present

## 2019-07-01 DIAGNOSIS — F0390 Unspecified dementia without behavioral disturbance: Secondary | ICD-10-CM | POA: Diagnosis not present

## 2019-07-01 DIAGNOSIS — R2681 Unsteadiness on feet: Secondary | ICD-10-CM | POA: Diagnosis not present

## 2019-07-12 DIAGNOSIS — R32 Unspecified urinary incontinence: Secondary | ICD-10-CM | POA: Diagnosis not present

## 2019-08-10 ENCOUNTER — Ambulatory Visit: Payer: Medicare HMO | Admitting: Radiation Oncology

## 2019-08-10 ENCOUNTER — Encounter: Payer: Self-pay | Admitting: Radiation Oncology

## 2019-08-10 ENCOUNTER — Other Ambulatory Visit: Payer: Self-pay

## 2019-08-10 ENCOUNTER — Ambulatory Visit
Admission: RE | Admit: 2019-08-10 | Discharge: 2019-08-10 | Disposition: A | Discharge status: Home / Self Care | Payer: Medicare HMO | Source: Ambulatory Visit | Attending: Radiation Oncology | Admitting: Radiation Oncology

## 2019-08-10 VITALS — BP 142/76 | HR 82 | Temp 97.2°F | Resp 16 | Wt 160.1 lb

## 2019-08-10 DIAGNOSIS — R351 Nocturia: Secondary | ICD-10-CM | POA: Diagnosis not present

## 2019-08-10 DIAGNOSIS — C61 Malignant neoplasm of prostate: Secondary | ICD-10-CM | POA: Diagnosis not present

## 2019-08-10 DIAGNOSIS — Z923 Personal history of irradiation: Secondary | ICD-10-CM | POA: Insufficient documentation

## 2019-08-10 NOTE — Progress Notes (Signed)
Radiation Oncology Follow up Note  Name: Jeremy [name]   Date:   08/10/2019 MRN:  [id number] DOB: 11-01-1945    This 74 y.o. male presents to the clinic today for 1 year follow-up status post radiation therapy to his prostate for stage IIb Gleason 8 (4+4) adenocarcinoma presenting with a PSA of 11  REFERRING PROVIDER: Idelle Crouch, MD  HPI: Patient is a 74 year old male now at 1 year of completed external beam radiation therapy to his prostate and pelvic nodes for Gleason 8 adenocarcinoma the prostate presenting with a PSA of 11 seen today in routine follow-up he is doing well he specifically denies bone pain.  He does have nocturia x2 some urgency and frequency during the day.Marland Kitchen  He is currently on Eligard injections by urology.  His most recent PSA is less than 8.33.  COMPLICATIONS OF TREATMENT: none  FOLLOW UP COMPLIANCE: keeps appointments   PHYSICAL EXAM:  BP (!) 142/76 (BP Location: Left Arm, Patient Position: Sitting)    Pulse 82    Temp (!) 97.2 F (36.2 C) (Tympanic)    Resp 16    Wt 160 lb 1.6 oz (72.6 kg)    BMI 24.34 kg/m  Well-developed well-nourished patient in NAD. HEENT reveals PERLA, EOMI, discs not visualized.  Oral cavity is clear. No oral mucosal lesions are identified. Neck is clear without evidence of cervical or supraclavicular adenopathy. Lungs are clear to A&P. Cardiac examination is essentially unremarkable with regular rate and rhythm without murmur rub or thrill. Abdomen is benign with no organomegaly or masses noted. Motor sensory and DTR levels are equal and symmetric in the upper and lower extremities. Cranial nerves II through XII are grossly intact. Proprioception is intact. No peripheral adenopathy or edema is identified. No motor or sensory levels are noted. Crude visual fields are within normal range.  RADIOLOGY RESULTS: No current films to review  PLAN: Present time patient is doing well under excellent biochemical control of his prostate  cancer although continues on androgen deprivation therapy.  I pleased with his overall progress.  I have asked to see him back in 1 year for follow-up.  He continues close follow-up care and treatment with urology.  I would like to take this opportunity to thank you for allowing me to participate in the care of your patient.Noreene Filbert, MD

## 2019-08-18 DIAGNOSIS — R32 Unspecified urinary incontinence: Secondary | ICD-10-CM | POA: Diagnosis not present

## 2019-08-23 DIAGNOSIS — E538 Deficiency of other specified B group vitamins: Secondary | ICD-10-CM | POA: Diagnosis not present

## 2019-08-25 ENCOUNTER — Telehealth: Payer: Self-pay

## 2019-08-25 NOTE — Telephone Encounter (Signed)
Fax sent for benefit verification on Eligard 08/23/2019.

## 2019-08-26 NOTE — Telephone Encounter (Signed)
NO PA required for Eligard through [facility] or Medicaid.

## 2019-09-14 DIAGNOSIS — R2681 Unsteadiness on feet: Secondary | ICD-10-CM | POA: Diagnosis not present

## 2019-09-14 DIAGNOSIS — R569 Unspecified convulsions: Secondary | ICD-10-CM | POA: Diagnosis not present

## 2019-09-23 DIAGNOSIS — E538 Deficiency of other specified B group vitamins: Secondary | ICD-10-CM | POA: Diagnosis not present

## 2019-10-28 DIAGNOSIS — E538 Deficiency of other specified B group vitamins: Secondary | ICD-10-CM | POA: Diagnosis not present

## 2019-11-02 DIAGNOSIS — F0390 Unspecified dementia without behavioral disturbance: Secondary | ICD-10-CM | POA: Diagnosis not present

## 2019-11-02 DIAGNOSIS — R569 Unspecified convulsions: Secondary | ICD-10-CM | POA: Diagnosis not present

## 2019-11-02 DIAGNOSIS — D508 Other iron deficiency anemias: Secondary | ICD-10-CM | POA: Diagnosis not present

## 2019-11-02 DIAGNOSIS — Z79899 Other long term (current) drug therapy: Secondary | ICD-10-CM | POA: Diagnosis not present

## 2019-11-02 DIAGNOSIS — D513 Other dietary vitamin B12 deficiency anemia: Secondary | ICD-10-CM | POA: Diagnosis not present

## 2019-11-02 DIAGNOSIS — I10 Essential (primary) hypertension: Secondary | ICD-10-CM | POA: Diagnosis not present

## 2019-11-02 DIAGNOSIS — E785 Hyperlipidemia, unspecified: Secondary | ICD-10-CM | POA: Diagnosis not present

## 2019-11-22 ENCOUNTER — Other Ambulatory Visit: Payer: Self-pay

## 2019-11-22 ENCOUNTER — Ambulatory Visit (INDEPENDENT_AMBULATORY_CARE_PROVIDER_SITE_OTHER): Payer: Medicare HMO | Admitting: Family Medicine

## 2019-11-22 DIAGNOSIS — C61 Malignant neoplasm of prostate: Secondary | ICD-10-CM

## 2019-11-22 MED ORDER — LEUPROLIDE ACETATE (6 MONTH) 45 MG ~~LOC~~ KIT
45.0000 mg | PACK | Freq: Once | SUBCUTANEOUS | Status: AC
  Administered 2019-11-22: 45 mg via SUBCUTANEOUS

## 2019-11-22 NOTE — Progress Notes (Signed)
Eligard SubQ Injection   Due to Prostate Cancer patient is present today for a Eligard Injection.  Medication: Eligard 6 month Dose: 45 mg  Location: left  Lot: [id number] Exp: 03/2021  Patient tolerated well, no complications were noted  Performed by: Elberta Leatherwood, [place]  Per Dr. Bernardo Heater patient is to continue therapy for 2 years. Patient's next follow up was scheduled for 05/21/2020 for injection. This appointment was scheduled using wheel and given to patient today along with reminder continue on Vitamin D 800-1000iu and Calium 1000-1200mg  daily while on Androgen Deprivation Therapy.  PA approval dates: No PA needed

## 2019-11-23 DIAGNOSIS — R32 Unspecified urinary incontinence: Secondary | ICD-10-CM | POA: Diagnosis not present

## 2019-11-28 ENCOUNTER — Ambulatory Visit: Payer: Self-pay

## 2019-11-29 DIAGNOSIS — E538 Deficiency of other specified B group vitamins: Secondary | ICD-10-CM | POA: Diagnosis not present

## 2019-12-02 ENCOUNTER — Ambulatory Visit: Payer: Medicare HMO | Admitting: Urology

## 2019-12-07 ENCOUNTER — Ambulatory Visit (INDEPENDENT_AMBULATORY_CARE_PROVIDER_SITE_OTHER): Payer: Medicare HMO | Admitting: Urology

## 2019-12-07 ENCOUNTER — Other Ambulatory Visit: Payer: Self-pay

## 2019-12-07 DIAGNOSIS — C61 Malignant neoplasm of prostate: Secondary | ICD-10-CM

## 2019-12-07 NOTE — Progress Notes (Signed)
12/07/2019 10:24 AM   [name] Jan 15, 1946 [id number]  Referring provider: Idelle Crouch, MD [facility] [place],  [place] [address]  Chief Complaint  Patient presents with  . Prostate Cancer    Urologic history: 1. cT1c high risk prostate cancer  - diagnosed October 2019; PSA 11; 38 cc  -Multiple cores positive Gleason 3+4, 4+3 and 4+4 -Treated IMRT + ADT completed 5/20 -Initial leuprolide injection 04/2018  HPI: 74 y.o. male presents for annual follow-up of prostate cancer.   Mild urinary frequency but not bothersome  Denies dysuria, gross hematuria  Denies flank, abdominal or pelvic pain  Last PSA March 2021 <0.01  Last leuprolide injection 11/22/2019   PMH: Past Medical History:  Diagnosis Date  . Cancer ([place]) 2019   prostate /radiation  . Dementia ([place])   . Hypertension   . Seizures ([place])    LAST 6 MO  AGO    Surgical History: Past Surgical History:  Procedure Laterality Date  . none    . PROSTATE BIOPSY N/A 12/15/2017   Procedure: BIOPSY TRANSRECTAL ULTRASONIC PROSTATE (TUBP);  Surgeon: [name], MD;  Location: [facility] ORS;  Service: Urology;  Laterality: N/A;  . ROBOT ASSISTED INGUINAL HERNIA REPAIR Right 09/02/2018   Procedure: ROBOT ASSISTED INGUINAL HERNIA REPAIR AND UMBILICAL HERNIA, RIGHT;  Surgeon: Jules Husbands, MD;  Location: [facility] ORS;  Service: General;  Laterality: Right;    Home Medications:  Allergies as of 12/07/2019   No Known Allergies     Medication List       Accurate as of December 07, 2019 10:24 AM. If you have any questions, ask your nurse or doctor.        acetaminophen 325 MG tablet Commonly known as: TYLENOL Take 650 mg by mouth every 4 (four) hours as needed for moderate pain, fever or headache.   bismuth subsalicylate 270 WC/37SE suspension Commonly known as: PEPTO BISMOL Take 15 mLs by mouth every 4 (four) hours as needed (for nausea).   Calcium  Carbonate-Vitamin D 600-400 MG-UNIT tablet Commonly known as: Calcium 600+D Take 1 tablet by mouth 2 (two) times daily.   Benetta Spar 200-200-20 MG/5ML suspension Generic drug: alum & mag hydroxide-simeth Take 30 mLs by mouth daily as needed for indigestion or heartburn.   guaiFENesin 100 MG/5ML liquid Commonly known as: ROBITUSSIN Take 200 mg by mouth every 6 (six) hours as needed for cough.   lamoTRIgine 25 MG tablet Commonly known as: LAMICTAL Take 50 mg by mouth 2 (two) times daily.   levETIRAcetam 500 MG tablet Commonly known as: KEPPRA Take by mouth.   loperamide 2 MG capsule Commonly known as: IMODIUM Take 2 mg by mouth 4 (four) times daily as needed for diarrhea or loose stools.   losartan-hydrochlorothiazide 100-25 MG tablet Commonly known as: HYZAAR Take 1 tablet by mouth daily.   magnesium hydroxide 400 MG/5ML suspension Commonly known as: MILK OF MAGNESIA Take 30 mLs by mouth daily as needed for mild constipation.   risperiDONE 2 MG tablet Commonly known as: RISPERDAL Take 1 mg by mouth 3 (three) times daily.   rosuvastatin 10 MG tablet Commonly known as: CRESTOR Take by mouth.   tamsulosin 0.4 MG Caps capsule Commonly known as: FLOMAX       Allergies: No Known Allergies  Family History: No family history on file.  Social History:  reports that he has quit smoking. He quit after 25.00 years of use. He has never used smokeless tobacco. He  reports previous alcohol use. He reports previous drug use. Drugs: Cocaine and IV.   Physical Exam: There were no vitals taken for this visit.  Constitutional:  Alert and oriented, No acute distress. HEENT: [place] AT, moist mucus membranes.  Trachea midline, no masses. Cardiovascular: No clubbing, cyanosis, or edema. Respiratory: Normal respiratory effort, no increased work of breathing. GI: Abdomen is soft, nontender, nondistended, no abdominal masses    Assessment & Plan:    1.  T1c high risk prostate  cancer  Status post IMRT + ADT  Leuprolide injection 11/2019 will put him at 2 years of ADT  PSA today  Radiation oncology follow-up scheduled June 2022 and I will see him in December 2022   [name], [place] [address] [place], [place] [address] [phone]

## 2019-12-08 ENCOUNTER — Encounter: Payer: Self-pay | Admitting: Urology

## 2019-12-08 LAB — PSA: Prostate Specific Ag, Serum: <0.1 ng/mL (ref 0.0–4.0)

## 2019-12-12 ENCOUNTER — Telehealth: Payer: Self-pay | Admitting: *Deleted

## 2019-12-12 NOTE — Telephone Encounter (Signed)
-----   Message from [name], MD sent at 12/10/2019 11:45 AM EDT ----- PSA remains undetectable at <0.1

## 2019-12-12 NOTE — Telephone Encounter (Signed)
Notified patient as instructed, patient pleased. Discussed follow-up appointments, patient agrees  

## 2019-12-30 DIAGNOSIS — E538 Deficiency of other specified B group vitamins: Secondary | ICD-10-CM | POA: Diagnosis not present

## 2019-12-30 DIAGNOSIS — D513 Other dietary vitamin B12 deficiency anemia: Secondary | ICD-10-CM | POA: Diagnosis not present

## 2020-01-06 DIAGNOSIS — R32 Unspecified urinary incontinence: Secondary | ICD-10-CM | POA: Diagnosis not present

## 2020-02-02 DIAGNOSIS — Z79899 Other long term (current) drug therapy: Secondary | ICD-10-CM | POA: Diagnosis not present

## 2020-02-02 DIAGNOSIS — I10 Essential (primary) hypertension: Secondary | ICD-10-CM | POA: Diagnosis not present

## 2020-02-02 DIAGNOSIS — E7800 Pure hypercholesterolemia, unspecified: Secondary | ICD-10-CM | POA: Diagnosis not present

## 2020-02-02 DIAGNOSIS — E538 Deficiency of other specified B group vitamins: Secondary | ICD-10-CM | POA: Diagnosis not present

## 2020-02-02 DIAGNOSIS — R569 Unspecified convulsions: Secondary | ICD-10-CM | POA: Diagnosis not present

## 2020-02-16 DIAGNOSIS — R32 Unspecified urinary incontinence: Secondary | ICD-10-CM | POA: Diagnosis not present

## 2020-03-27 DIAGNOSIS — R32 Unspecified urinary incontinence: Secondary | ICD-10-CM | POA: Diagnosis not present

## 2020-04-04 DIAGNOSIS — E538 Deficiency of other specified B group vitamins: Secondary | ICD-10-CM | POA: Diagnosis not present

## 2020-05-07 DIAGNOSIS — E538 Deficiency of other specified B group vitamins: Secondary | ICD-10-CM | POA: Diagnosis not present

## 2020-05-18 ENCOUNTER — Other Ambulatory Visit: Payer: Self-pay | Admitting: Physician Assistant

## 2020-05-21 ENCOUNTER — Ambulatory Visit: Payer: Self-pay

## 2020-05-21 ENCOUNTER — Other Ambulatory Visit: Payer: Self-pay

## 2020-05-21 ENCOUNTER — Ambulatory Visit (INDEPENDENT_AMBULATORY_CARE_PROVIDER_SITE_OTHER): Payer: Medicare HMO

## 2020-05-21 DIAGNOSIS — C61 Malignant neoplasm of prostate: Secondary | ICD-10-CM

## 2020-05-21 DIAGNOSIS — F0390 Unspecified dementia without behavioral disturbance: Secondary | ICD-10-CM | POA: Diagnosis not present

## 2020-05-21 DIAGNOSIS — E785 Hyperlipidemia, unspecified: Secondary | ICD-10-CM | POA: Diagnosis not present

## 2020-05-21 DIAGNOSIS — E538 Deficiency of other specified B group vitamins: Secondary | ICD-10-CM | POA: Diagnosis not present

## 2020-05-21 DIAGNOSIS — I10 Essential (primary) hypertension: Secondary | ICD-10-CM | POA: Diagnosis not present

## 2020-05-21 DIAGNOSIS — Z79899 Other long term (current) drug therapy: Secondary | ICD-10-CM | POA: Diagnosis not present

## 2020-05-21 MED ORDER — LEUPROLIDE ACETATE (6 MONTH) 45 MG ~~LOC~~ KIT
45.0000 mg | PACK | Freq: Once | SUBCUTANEOUS | Status: AC
  Administered 2020-05-21: 45 mg via SUBCUTANEOUS

## 2020-05-21 NOTE — Progress Notes (Signed)
Eligard SubQ Injection   Due to Prostate Cancer patient is present today for a Eligard Injection.  Medication: Eligard 6 month Dose: 45 mg  Location: left  Lot: [id number] Exp: 08/2021 Patient tolerated well, no complications were noted.  Performed by: Gordy [name], [place]   Per Dr. Bernardo Heater patient is to continue therapy for 2 years . Patient's next follow up was scheduled for November 12, 2020. This appointment was scheduled using wheel and given to patient today along with reminder continue on Vitamin D 800-1000iu and Calium 1000-1200mg  daily while on Androgen Deprivation Therapy.  PA approval dates: No PA required.

## 2020-05-21 NOTE — Patient Instructions (Signed)

## 2020-05-21 NOTE — Telephone Encounter (Signed)
It looks like he canceled his appointment for his Eligard this month.  He needs to get it rescheduled.

## 2020-05-24 DIAGNOSIS — R32 Unspecified urinary incontinence: Secondary | ICD-10-CM | POA: Diagnosis not present

## 2020-06-07 DIAGNOSIS — E538 Deficiency of other specified B group vitamins: Secondary | ICD-10-CM | POA: Diagnosis not present

## 2020-06-19 DIAGNOSIS — R32 Unspecified urinary incontinence: Secondary | ICD-10-CM | POA: Diagnosis not present

## 2020-06-21 ENCOUNTER — Other Ambulatory Visit: Payer: Self-pay | Admitting: Urology

## 2020-07-10 DIAGNOSIS — E538 Deficiency of other specified B group vitamins: Secondary | ICD-10-CM | POA: Diagnosis not present

## 2020-07-19 ENCOUNTER — Other Ambulatory Visit: Payer: Self-pay | Admitting: Urology

## 2020-07-27 ENCOUNTER — Other Ambulatory Visit: Payer: Self-pay | Admitting: *Deleted

## 2020-08-01 ENCOUNTER — Inpatient Hospital Stay: Payer: Medicare HMO | Attending: Radiation Oncology

## 2020-08-08 ENCOUNTER — Ambulatory Visit: Payer: Medicare HMO | Admitting: Radiation Oncology

## 2020-08-08 ENCOUNTER — Inpatient Hospital Stay: Payer: Medicare HMO

## 2020-08-10 DIAGNOSIS — E538 Deficiency of other specified B group vitamins: Secondary | ICD-10-CM | POA: Diagnosis not present

## 2020-08-18 DIAGNOSIS — R32 Unspecified urinary incontinence: Secondary | ICD-10-CM | POA: Diagnosis not present

## 2020-08-22 ENCOUNTER — Other Ambulatory Visit: Payer: Self-pay | Admitting: Urology

## 2020-09-12 DIAGNOSIS — R2681 Unsteadiness on feet: Secondary | ICD-10-CM | POA: Diagnosis not present

## 2020-09-12 DIAGNOSIS — R569 Unspecified convulsions: Secondary | ICD-10-CM | POA: Diagnosis not present

## 2020-09-12 DIAGNOSIS — Z79899 Other long term (current) drug therapy: Secondary | ICD-10-CM | POA: Diagnosis not present

## 2020-09-12 DIAGNOSIS — E538 Deficiency of other specified B group vitamins: Secondary | ICD-10-CM | POA: Diagnosis not present

## 2020-09-12 DIAGNOSIS — I10 Essential (primary) hypertension: Secondary | ICD-10-CM | POA: Diagnosis not present

## 2020-09-12 DIAGNOSIS — E7800 Pure hypercholesterolemia, unspecified: Secondary | ICD-10-CM | POA: Diagnosis not present

## 2020-09-15 ENCOUNTER — Other Ambulatory Visit: Payer: Self-pay

## 2020-09-15 ENCOUNTER — Encounter: Payer: Self-pay | Admitting: Radiology

## 2020-09-15 ENCOUNTER — Inpatient Hospital Stay
Admission: EM | Admit: 2020-09-15 | Discharge: 2020-09-20 | DRG: 177 | Disposition: A | Discharge status: Skilled Nursing Facility | Payer: Medicare HMO | Source: Skilled Nursing Facility | Attending: Internal Medicine | Admitting: Internal Medicine

## 2020-09-15 ENCOUNTER — Emergency Department: Payer: Medicare HMO

## 2020-09-15 DIAGNOSIS — I10 Essential (primary) hypertension: Secondary | ICD-10-CM | POA: Diagnosis not present

## 2020-09-15 DIAGNOSIS — D6489 Other specified anemias: Secondary | ICD-10-CM | POA: Diagnosis present

## 2020-09-15 DIAGNOSIS — F0390 Unspecified dementia without behavioral disturbance: Secondary | ICD-10-CM

## 2020-09-15 DIAGNOSIS — E860 Dehydration: Secondary | ICD-10-CM | POA: Diagnosis present

## 2020-09-15 DIAGNOSIS — F05 Delirium due to known physiological condition: Secondary | ICD-10-CM | POA: Diagnosis not present

## 2020-09-15 DIAGNOSIS — R4182 Altered mental status, unspecified: Secondary | ICD-10-CM

## 2020-09-15 DIAGNOSIS — G40909 Epilepsy, unspecified, not intractable, without status epilepticus: Secondary | ICD-10-CM | POA: Diagnosis present

## 2020-09-15 DIAGNOSIS — N281 Cyst of kidney, acquired: Secondary | ICD-10-CM | POA: Diagnosis not present

## 2020-09-15 DIAGNOSIS — Z87891 Personal history of nicotine dependence: Secondary | ICD-10-CM | POA: Diagnosis not present

## 2020-09-15 DIAGNOSIS — Z79899 Other long term (current) drug therapy: Secondary | ICD-10-CM | POA: Diagnosis not present

## 2020-09-15 DIAGNOSIS — R404 Transient alteration of awareness: Secondary | ICD-10-CM | POA: Diagnosis not present

## 2020-09-15 DIAGNOSIS — R6510 Systemic inflammatory response syndrome (SIRS) of non-infectious origin without acute organ dysfunction: Secondary | ICD-10-CM | POA: Diagnosis present

## 2020-09-15 DIAGNOSIS — R569 Unspecified convulsions: Secondary | ICD-10-CM | POA: Diagnosis not present

## 2020-09-15 DIAGNOSIS — U071 COVID-19: Principal | ICD-10-CM | POA: Diagnosis present

## 2020-09-15 DIAGNOSIS — F0280 Dementia in other diseases classified elsewhere without behavioral disturbance: Secondary | ICD-10-CM | POA: Diagnosis not present

## 2020-09-15 DIAGNOSIS — R000 Tachycardia, unspecified: Secondary | ICD-10-CM | POA: Diagnosis not present

## 2020-09-15 DIAGNOSIS — N182 Chronic kidney disease, stage 2 (mild): Secondary | ICD-10-CM | POA: Diagnosis present

## 2020-09-15 DIAGNOSIS — Z923 Personal history of irradiation: Secondary | ICD-10-CM

## 2020-09-15 DIAGNOSIS — N179 Acute kidney failure, unspecified: Secondary | ICD-10-CM | POA: Diagnosis present

## 2020-09-15 DIAGNOSIS — I129 Hypertensive chronic kidney disease with stage 1 through stage 4 chronic kidney disease, or unspecified chronic kidney disease: Secondary | ICD-10-CM | POA: Diagnosis present

## 2020-09-15 DIAGNOSIS — Z8546 Personal history of malignant neoplasm of prostate: Secondary | ICD-10-CM

## 2020-09-15 DIAGNOSIS — R0902 Hypoxemia: Secondary | ICD-10-CM | POA: Diagnosis not present

## 2020-09-15 DIAGNOSIS — I517 Cardiomegaly: Secondary | ICD-10-CM | POA: Diagnosis not present

## 2020-09-15 DIAGNOSIS — G9341 Metabolic encephalopathy: Secondary | ICD-10-CM | POA: Diagnosis present

## 2020-09-15 DIAGNOSIS — N400 Enlarged prostate without lower urinary tract symptoms: Secondary | ICD-10-CM | POA: Diagnosis present

## 2020-09-15 DIAGNOSIS — G309 Alzheimer's disease, unspecified: Secondary | ICD-10-CM | POA: Diagnosis not present

## 2020-09-15 DIAGNOSIS — R32 Unspecified urinary incontinence: Secondary | ICD-10-CM | POA: Diagnosis not present

## 2020-09-15 LAB — URINALYSIS, COMPLETE (UACMP) WITH MICROSCOPIC
Bacteria, UA: NONE SEEN
Bilirubin Urine: NEGATIVE
Glucose, UA: NEGATIVE mg/dL
Ketones, ur: NEGATIVE mg/dL
Leukocytes,Ua: NEGATIVE
Nitrite: NEGATIVE
Protein, ur: NEGATIVE mg/dL
Specific Gravity, Urine: 1.011 (ref 1.005–1.030)
pH: 5 (ref 5.0–8.0)

## 2020-09-15 LAB — COMPREHENSIVE METABOLIC PANEL
ALT: 12 U/L (ref 0–44)
AST: 27 U/L (ref 15–41)
Albumin: 4.2 g/dL (ref 3.5–5.0)
Alkaline Phosphatase: 47 U/L (ref 38–126)
Anion gap: 9 (ref 5–15)
BUN: 29 mg/dL — ABNORMAL HIGH (ref 8–23)
CO2: 23 mmol/L (ref 22–32)
Calcium: 9.7 mg/dL (ref 8.9–10.3)
Chloride: 108 mmol/L (ref 98–111)
Creatinine, Ser: 2.29 mg/dL — ABNORMAL HIGH (ref 0.61–1.24)
GFR, Estimated: 29 mL/min — ABNORMAL LOW (ref >60)
Glucose, Bld: 116 mg/dL — ABNORMAL HIGH (ref 70–99)
Potassium: 3.7 mmol/L (ref 3.5–5.1)
Sodium: 140 mmol/L (ref 135–145)
Total Bilirubin: 0.6 mg/dL (ref 0.3–1.2)
Total Protein: 7.5 g/dL (ref 6.5–8.1)

## 2020-09-15 LAB — CBC WITH DIFFERENTIAL/PLATELET
Abs Immature Granulocytes: 0.01 10*3/uL (ref 0.00–0.07)
Basophils Absolute: 0 10*3/uL (ref 0.0–0.1)
Basophils Relative: 0 %
Eosinophils Absolute: 0 10*3/uL (ref 0.0–0.5)
Eosinophils Relative: 0 %
HCT: 24.3 % — ABNORMAL LOW (ref 39.0–52.0)
Hemoglobin: 8.2 g/dL — ABNORMAL LOW (ref 13.0–17.0)
Immature Granulocytes: 0 %
Lymphocytes Relative: 10 %
Lymphs Abs: 0.5 10*3/uL — ABNORMAL LOW (ref 0.7–4.0)
MCH: 29.8 pg (ref 26.0–34.0)
MCHC: 33.7 g/dL (ref 30.0–36.0)
MCV: 88.4 fL (ref 80.0–100.0)
Monocytes Absolute: 0.8 10*3/uL (ref 0.1–1.0)
Monocytes Relative: 15 %
Neutro Abs: 4.1 10*3/uL (ref 1.7–7.7)
Neutrophils Relative %: 75 %
Platelets: 189 10*3/uL (ref 150–400)
RBC: 2.75 MIL/uL — ABNORMAL LOW (ref 4.22–5.81)
RDW: 14.6 % (ref 11.5–15.5)
WBC: 5.4 10*3/uL (ref 4.0–10.5)
nRBC: 0 % (ref 0.0–0.2)

## 2020-09-15 LAB — PROTIME-INR
INR: 1.1 (ref 0.8–1.2)
Prothrombin Time: 14 seconds (ref 11.4–15.2)

## 2020-09-15 LAB — APTT: aPTT: 33 seconds (ref 24–36)

## 2020-09-15 LAB — RESP PANEL BY RT-PCR (FLU A&B, COVID) ARPGX2
Influenza A by PCR: NEGATIVE
Influenza B by PCR: NEGATIVE
SARS Coronavirus 2 by RT PCR: POSITIVE — AB

## 2020-09-15 LAB — LACTIC ACID, PLASMA: Lactic Acid, Venous: 1.5 mmol/L (ref 0.5–1.9)

## 2020-09-15 LAB — MAGNESIUM: Magnesium: 1.9 mg/dL (ref 1.7–2.4)

## 2020-09-15 LAB — PROCALCITONIN: Procalcitonin: <0.1 ng/mL

## 2020-09-15 MED ORDER — SODIUM CHLORIDE 0.9 % IV SOLN: 1.0000 g | Freq: Once | INTRAVENOUS | Status: DC

## 2020-09-15 MED ORDER — SODIUM CHLORIDE 0.9 % IV SOLN
100.0000 mg | Freq: Every day | INTRAVENOUS | Status: AC
  Administered 2020-09-16 – 2020-09-19 (×4): 100 mg via INTRAVENOUS
  Filled 2020-09-15: qty 20
  Filled 2020-09-15: qty 100
  Filled 2020-09-15: qty 20
  Filled 2020-09-15: qty 100

## 2020-09-15 MED ORDER — SODIUM CHLORIDE 0.9 % IV SOLN
200.0000 mg | Freq: Once | INTRAVENOUS | Status: AC
  Administered 2020-09-15: 200 mg via INTRAVENOUS
  Filled 2020-09-15: qty 200

## 2020-09-15 MED ORDER — LACTATED RINGERS IV SOLN: INTRAVENOUS | Status: AC

## 2020-09-15 MED ORDER — LACTATED RINGERS IV BOLUS (SEPSIS)
1000.0000 mL | Freq: Once | INTRAVENOUS | Status: AC
  Administered 2020-09-15: 1000 mL via INTRAVENOUS

## 2020-09-15 MED ORDER — ACETAMINOPHEN 500 MG PO TABS
1000.0000 mg | ORAL_TABLET | Freq: Once | ORAL | Status: AC
  Administered 2020-09-15: 1000 mg via ORAL
  Filled 2020-09-15: qty 2

## 2020-09-15 NOTE — ED Notes (Signed)
Dr. Creig Hines notified of pt's positive covid test result.

## 2020-09-15 NOTE — Sepsis Progress Note (Signed)
Following per Sepsis protocol

## 2020-09-15 NOTE — ED Provider Notes (Signed)
[facility] Emergency Department Provider Note  ____________________________________________   Event Date/Time   First MD Initiated Contact with Patient 09/15/20 2000     (approximate)  I have reviewed the triage vital signs and the nursing notes.   HISTORY  Chief Complaint Altered Mental Status (C/o AMS with fever today. Pt. From SNF. PT. Received 500LR via EMS en route.)   HPI Jeremy [name] is a 75 y.o. male with a past medical history of prostate cancer status post radiation therapy, HTN, HDL, seizure disorder on Keppra and Lamictal as well as history of dementia seeming mild symptoms noted in office visits on 4/4 of forgetfulness but typically oriented who presents via EMS from nursing facility with concerns for acute confusion today in the setting of a fever.  Patient denies any complaints including chest pain, cough, abdominal pain, nausea denies any recent falls or injuries.  He is not oriented to year and location although is awake and alert and able to follow commands.  He denies any other acute concerns at this time but further history is limited from the patient secondary to his altered mental status.         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   Dementia with behavioral disturbance ([place]) 11/02/2015   Hypertension 11/02/2015   Seizures ([place]) 11/02/2015   Incarcerated hernia    Crack cocaine use 12/19/2013   Chronic tension-type headache, not intractable 08/29/2013   Memory loss 08/29/2013   Numbness 08/29/2013    Past Surgical History:  Procedure Laterality Date   none     PROSTATE BIOPSY N/A 12/15/2017   Procedure: BIOPSY TRANSRECTAL ULTRASONIC  PROSTATE (TUBP);  Surgeon: [name], MD;  Location: [facility] ORS;  Service: Urology;  Laterality: N/A;   ROBOT ASSISTED INGUINAL HERNIA REPAIR Right 09/02/2018   Procedure: ROBOT ASSISTED INGUINAL HERNIA REPAIR AND UMBILICAL HERNIA, RIGHT;  Surgeon: Jules Husbands, MD;  Location: [facility] ORS;  Service: General;  Laterality: Right;    Prior to Admission medications   Medication Sig Start Date End Date Taking? Authorizing Provider  acetaminophen (TYLENOL) 325 MG tablet Take 650 mg by mouth every 4 (four) hours as needed for moderate pain, fever or headache.   Yes [provider]  alum & mag hydroxide-simeth (MAALOX/MYLANTA) 200-200-20 MG/5ML suspension Take 30 mLs by mouth daily as needed for indigestion or heartburn.   Yes [provider]  bismuth subsalicylate (PEPTO BISMOL) 262 MG/15ML suspension Take 15 mLs by mouth every 4 (four) hours as needed (for nausea).   Yes [provider]  CALCIUM + VITAMIN D3 600-10 MG-MCG TABS TAKE 1 TABLET BY MOUTH 2 TIMES DAILY 08/25/20  Yes Stoioff, Ronda Fairly, MD  guaiFENesin (ROBITUSSIN) 100 MG/5ML liquid Take 200 mg by mouth every 6 (six) hours as needed for cough.   Yes [provider]  lamoTRIgine (LAMICTAL) 25 MG tablet Take 50 mg by mouth 2 (two) times daily.  03/02/18  Yes [provider]  levETIRAcetam (KEPPRA) 500 MG tablet Take 500 mg by mouth 2 (two) times daily. 01/27/19 09/15/20 Yes [provider]  loperamide (IMODIUM) 2 MG capsule Take 2 mg  by mouth 4 (four) times daily as needed for diarrhea or loose stools.   Yes [provider]  losartan-hydrochlorothiazide (HYZAAR) 100-25 MG tablet Take 1 tablet by mouth daily.   Yes [provider]  magnesium hydroxide (MILK OF MAGNESIA) 400 MG/5ML suspension Take 30 mLs by mouth daily as needed for mild constipation.   Yes [provider]  olmesartan (BENICAR) 40 MG tablet Take 40 mg by mouth daily. 08/22/20  Yes [provider]   risperiDONE (RISPERDAL) 2 MG tablet Take 1 mg by mouth 3 (three) times daily.   Yes [provider]  rosuvastatin (CRESTOR) 10 MG tablet Take 10 mg by mouth daily. 11/02/19 11/01/20 Yes [provider]  tamsulosin (FLOMAX) 0.4 MG CAPS capsule Take 0.4 mg by mouth daily after supper. 07/01/19  Yes [provider]    Allergies Patient has no known allergies.  No family history on file.  Social History Social History   Tobacco Use   Smoking status: Former    Years: 25.00    Types: Cigarettes   Smokeless tobacco: Never  Vaping Use   Vaping Use: Never used  Substance Use Topics   Alcohol use: Not Currently    Comment: quit etoh x 3 months ago   Drug use: Not Currently    Types: Cocaine, IV    Comment: quit 3 months ago - x 17 year drug and alcohol use    Review of Systems  Review of Systems  Unable to perform ROS: Mental status change     ____________________________________________   PHYSICAL EXAM:  VITAL SIGNS: ED Triage Vitals  Enc Vitals Group     BP      Pulse      Resp      Temp      Temp src      SpO2      Weight      Height      Head Circumference      Peak Flow      Pain Score      Pain Loc      Pain Edu?      Excl. in [place]?    Vitals:   09/15/20 2130 09/15/20 2145  BP: (!) 151/75 130/72  Pulse: 91 90  Resp: 15 13  Temp:    SpO2: 98% 97%   Physical Exam Vitals and nursing note reviewed.  Constitutional:      Appearance: He is well-developed.  HENT:     Head: Normocephalic and atraumatic.     Right Ear: External ear normal.     Left Ear: External ear normal.     Nose: Nose normal.     Mouth/Throat:     Mouth: Mucous membranes are dry.  Eyes:     Conjunctiva/sclera: Conjunctivae normal.  Cardiovascular:     Rate and Rhythm: Regular rhythm. Tachycardia present.     Heart sounds: No murmur heard. Pulmonary:     Effort: Pulmonary effort is normal. No respiratory distress.     Breath sounds: Normal breath sounds.   Abdominal:     Palpations: Abdomen is soft.     Tenderness: There is no abdominal tenderness.  Musculoskeletal:     Cervical back: Neck supple.  Skin:    General: Skin is warm and dry.     Capillary Refill: Capillary refill takes more than 3 seconds.  Neurological:     Mental Status: He is alert. He is disoriented and confused.    No tenderness  over C/T/L-spine.  Patient is able to give examiner thumbs up with both hands and has equal grip strength.  He is able to move his feet on command on both sides but the left heels off the bed for significant amount of time.  Strength is fairly symmetric although overall globally weak.  Sensation is intact to light touch lower extremities.  2+ radial and DP pulses.  PERRLA.  EOMI.  Cranial nerves II through XII are grossly intact.  Extremities are unremarkable without joint effusions, tenderness, erythema or other obvious foci of infection or trauma. ____________________________________________   LABS (all labs ordered are listed, but only abnormal results are displayed)  Labs Reviewed  RESP PANEL BY RT-PCR (FLU A&B, COVID) ARPGX2 - Abnormal; Notable for the following components:      Result Value   SARS Coronavirus 2 by RT PCR POSITIVE (*)    All other components within normal limits  COMPREHENSIVE METABOLIC PANEL - Abnormal; Notable for the following components:   Glucose, Bld 116 (*)    BUN 29 (*)    Creatinine, Ser 2.29 (*)    GFR, Estimated 29 (*)    All other components within normal limits  CBC WITH DIFFERENTIAL/PLATELET - Abnormal; Notable for the following components:   RBC 2.75 (*)    Hemoglobin 8.2 (*)    HCT 24.3 (*)    Lymphs Abs 0.5 (*)    All other components within normal limits  URINALYSIS, COMPLETE (UACMP) WITH MICROSCOPIC - Abnormal; Notable for the following components:   Color, Urine STRAW (*)    APPearance CLEAR (*)    Hgb urine dipstick MODERATE (*)    All other components within normal limits  CULTURE, BLOOD  (ROUTINE X 2)  CULTURE, BLOOD (ROUTINE X 2)  URINE CULTURE  LACTIC ACID, PLASMA  PROTIME-INR  APTT  PROCALCITONIN  MAGNESIUM  LACTIC ACID, PLASMA  PROCALCITONIN  LAMOTRIGINE LEVEL   ____________________________________________  EKG  Sinus tachycardia with ventricular rate of 110, QTc interval 482 with otherwise unremarkable intervals ____________________________________________  RADIOLOGY  ED MD interpretation: CT head shows no evidence of acute intracranial process.  There are some remote lacunar infarcts noted in the left basal ganglia without evidence of subacute or acute CVA or other clear acute intracranial process.  Chest x-ray shows stable borderline cardiomegaly without edema, focal consolidation, effusion or any other clear acute intrathoracic process.  Official radiology report(s): CT Head Wo Contrast  Result Date: 09/15/2020 CLINICAL DATA:  Altered mental status EXAM: CT HEAD WITHOUT CONTRAST TECHNIQUE: Contiguous axial images were obtained from the base of the skull through the vertex without intravenous contrast. COMPARISON:  07/10/2017 FINDINGS: Brain: Normal anatomic configuration. Parenchymal volume loss is commensurate with the patient's age. Mild periventricular white matter changes are present likely reflecting the sequela of small vessel ischemia. Tiny remote lacunar infarcts are noted within the a left caudate and lentiform nucleus. No abnormal intra or extra-axial mass lesion or fluid collection. No abnormal mass effect or midline shift. No evidence of acute intracranial hemorrhage or infarct. Ventricular size is normal. Cerebellum unremarkable. Vascular: No asymmetric hyperdense vasculature at the skull base. Skull: Intact Sinuses/Orbits: Mild mucosal thickening within the right maxillary sinus with small layering fluid. Bilateral maxillary antrostomy has been performed. Minimal layering secretions within the left sphenoid sinus. Remaining paranasal sinuses are clear.  Orbits are unremarkable. Other: Mastoid air cells and middle ear cavities are clear. IMPRESSION: No acute intracranial abnormality. Mild senescent change. 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  Mild paranasal sinus disease. Electronically Signed   By: Fidela Salisbury MD   On: 09/15/2020 21:22   DG Chest Port 1 View  Result Date: 09/15/2020 CLINICAL DATA:  Questionable sepsis - evaluate for abnormality Fever today. EXAM: PORTABLE CHEST 1 VIEW COMPARISON:  Chest radiograph 06/25/2017 FINDINGS: Stable cardiomediastinal contours. Borderline cardiomegaly the lungs are clear. Pulmonary vasculature is normal. No consolidation, pleural effusion, or pneumothorax. No acute osseous abnormalities are seen. IMPRESSION: Stable borderline cardiomegaly.  No acute pulmonary process. Electronically Signed   By: Keith Rake M.D.   On: 09/15/2020 20:53    ____________________________________________   PROCEDURES  Procedure(s) performed (including Critical Care):  .1-3 Lead EKG Interpretation  Date/Time: 09/15/2020 10:42 PM Performed by: Lucrezia Starch, MD Authorized by: Lucrezia Starch, MD     Interpretation: non-specific     ECG rate assessment: tachycardic     Rhythm: sinus rhythm     Ectopy: none     Conduction: normal     ____________________________________________   INITIAL IMPRESSION / ASSESSMENT AND PLAN / ED COURSE      Patient presents with above-stated history exam for assessment of acute onset of altered mental status and confusion in the setting of some baseline dementia associate with fever.  On arrival patient is tachycardic at 117 with a temperature of 99.  Otherwise with stable vitals on room air.  He is awake and alert with a nonfocal exam although is not oriented to date but apparently he typically is.  He denies any acute complaints.  Initial differential includes possible SAH or CVA, sepsis, metabolic derangements less likely acute trauma or toxic  ingestion.  CT head shows no evidence of acute intracranial process.  There are some remote lacunar infarcts noted in the left basal ganglia without evidence of subacute or acute CVA or other clear acute intracranial process.  Chest x-ray shows stable borderline cardiomegaly without edema, focal consolidation, effusion or any other clear acute intrathoracic process.  CMP remarkable for creatinine of 2.29 likely representing AKI as it was last 1.312 years ago EHR.  No other significant electrolyte or metabolic derangements.  CBC shows no leukocytosis and hemoglobin 8.2 compared to 9.52 years ago.  Lactic acid is nonelevated 1.5.  COVID influenza PCR is positive for COVID.  Urinalysis is unremarkable for evidence of infection and only has some moderate hemoglobin.  Procalcitonin is undetectable.  Magnesium is WNL.   Overall suspect patient's presentation today and altered mental status is likely secondary to some significant dehydration in the setting of acute COVID illness.  He is not hypoxic and thus I will defer steroids but will order remdesivir.  Given absence of leukocytosis and undetectable procalcitonin have a lower suspicion for acute bacterial infection.  We will hydrate with IV fluids.  No findings on CT or exam to suggest acute CVA.  I will admit to medicine service for further evaluation management.   ____________________________________________   FINAL CLINICAL IMPRESSION(S) / ED DIAGNOSES  Final diagnoses:  COVID  AKI (acute kidney injury) ([place])  Altered mental status, unspecified altered mental status type    Medications  lactated ringers infusion ( Intravenous New Bag/Given 09/15/20 2026)  remdesivir 200 mg in sodium chloride 0.9% 250 mL IVPB (has no administration in time range)    Followed by  remdesivir 100 mg in sodium chloride 0.9 % 100 mL IVPB (has no administration in time range)  lactated ringers bolus 1,000 mL (1,000 mLs Intravenous New Bag/Given 09/15/20 2026)   acetaminophen (TYLENOL) tablet  1,000 mg (1,000 mg Oral Given 09/15/20 2029)     ED Discharge Orders     None        Note:  This document was prepared using Dragon voice recognition software and may include unintentional dictation errors.    Lucrezia Starch, MD 09/15/20 [phone]

## 2020-09-15 NOTE — ED Notes (Signed)
Jeremy [name] from pt's SNF notified of pt's admission status by this RN.

## 2020-09-15 NOTE — Consult Note (Signed)
Remdesivir - Pharmacy Brief Note   O:  ALT: 12 CXR: Stable borderline cardiomegaly.  No acute pulmonary process. SpO2: 97% on RA   A/P:  Remdesivir 200 mg IVPB once followed by 100 mg IVPB daily x 4 days.   .me 09/15/2020 10:01 PM

## 2020-09-15 NOTE — ED Triage Notes (Signed)
C/o AMS with fever today. Pt. From SNF. PT. Received 500LR via EMS en route.

## 2020-09-15 NOTE — ED Notes (Signed)
Spoke with Madelynn Done from pt's SNF, updated on pt. Condition, and POC. Will update with news of admit or d/c.

## 2020-09-16 ENCOUNTER — Encounter: Payer: Self-pay | Admitting: Internal Medicine

## 2020-09-16 DIAGNOSIS — U071 COVID-19: Principal | ICD-10-CM

## 2020-09-16 DIAGNOSIS — N179 Acute kidney failure, unspecified: Secondary | ICD-10-CM

## 2020-09-16 DIAGNOSIS — F05 Delirium due to known physiological condition: Secondary | ICD-10-CM

## 2020-09-16 LAB — LACTIC ACID, PLASMA: Lactic Acid, Venous: 1.1 mmol/L (ref 0.5–1.9)

## 2020-09-16 LAB — CBC
HCT: 24 % — ABNORMAL LOW (ref 39.0–52.0)
Hemoglobin: 8 g/dL — ABNORMAL LOW (ref 13.0–17.0)
MCH: 29.2 pg (ref 26.0–34.0)
MCHC: 33.3 g/dL (ref 30.0–36.0)
MCV: 87.6 fL (ref 80.0–100.0)
Platelets: 170 10*3/uL (ref 150–400)
RBC: 2.74 MIL/uL — ABNORMAL LOW (ref 4.22–5.81)
RDW: 14.7 % (ref 11.5–15.5)
WBC: 4.7 10*3/uL (ref 4.0–10.5)
nRBC: 0 % (ref 0.0–0.2)

## 2020-09-16 LAB — CREATININE, SERUM
Creatinine, Ser: 2.13 mg/dL — ABNORMAL HIGH (ref 0.61–1.24)
GFR, Estimated: 32 mL/min — ABNORMAL LOW (ref >60)

## 2020-09-16 LAB — PROCALCITONIN: Procalcitonin: <0.1 ng/mL

## 2020-09-16 MED ORDER — LAMOTRIGINE 25 MG PO TABS
50.0000 mg | ORAL_TABLET | Freq: Two times a day (BID) | ORAL | Status: DC
  Administered 2020-09-16 – 2020-09-20 (×9): 50 mg via ORAL
  Filled 2020-09-16 (×9): qty 2

## 2020-09-16 MED ORDER — ROSUVASTATIN CALCIUM 10 MG PO TABS
10.0000 mg | ORAL_TABLET | Freq: Every day | ORAL | Status: DC
  Administered 2020-09-16 – 2020-09-19 (×4): 10 mg via ORAL
  Filled 2020-09-16 (×4): qty 1

## 2020-09-16 MED ORDER — GUAIFENESIN 100 MG/5ML PO SOLN
200.0000 mg | Freq: Four times a day (QID) | ORAL | Status: DC | PRN
  Filled 2020-09-16: qty 10

## 2020-09-16 MED ORDER — ONDANSETRON HCL 4 MG PO TABS: 4.0000 mg | ORAL_TABLET | Freq: Four times a day (QID) | ORAL | Status: DC | PRN

## 2020-09-16 MED ORDER — RISPERIDONE 1 MG PO TABS
1.0000 mg | ORAL_TABLET | Freq: Three times a day (TID) | ORAL | Status: DC
  Administered 2020-09-16 – 2020-09-20 (×13): 1 mg via ORAL
  Filled 2020-09-16 (×16): qty 1

## 2020-09-16 MED ORDER — TAMSULOSIN HCL 0.4 MG PO CAPS
0.4000 mg | ORAL_CAPSULE | Freq: Every day | ORAL | Status: DC
  Administered 2020-09-16 – 2020-09-19 (×4): 0.4 mg via ORAL
  Filled 2020-09-16 (×4): qty 1

## 2020-09-16 MED ORDER — ONDANSETRON HCL 4 MG/2ML IJ SOLN: 4.0000 mg | Freq: Four times a day (QID) | INTRAMUSCULAR | Status: DC | PRN

## 2020-09-16 MED ORDER — ACETAMINOPHEN 325 MG PO TABS
650.0000 mg | ORAL_TABLET | Freq: Four times a day (QID) | ORAL | Status: DC | PRN
  Administered 2020-09-16: 650 mg via ORAL
  Filled 2020-09-16: qty 2

## 2020-09-16 MED ORDER — ENOXAPARIN SODIUM 30 MG/0.3ML IJ SOSY
30.0000 mg | PREFILLED_SYRINGE | INTRAMUSCULAR | Status: DC
  Administered 2020-09-16 – 2020-09-18 (×3): 30 mg via SUBCUTANEOUS
  Filled 2020-09-16 (×3): qty 0.3

## 2020-09-16 MED ORDER — ZINC SULFATE 220 (50 ZN) MG PO CAPS
220.0000 mg | ORAL_CAPSULE | Freq: Every day | ORAL | Status: DC
  Administered 2020-09-16 – 2020-09-20 (×5): 220 mg via ORAL
  Filled 2020-09-16 (×5): qty 1

## 2020-09-16 MED ORDER — ASCORBIC ACID 500 MG PO TABS
500.0000 mg | ORAL_TABLET | Freq: Every day | ORAL | Status: DC
  Administered 2020-09-16 – 2020-09-20 (×5): 500 mg via ORAL
  Filled 2020-09-16 (×5): qty 1

## 2020-09-16 MED ORDER — SENNOSIDES-DOCUSATE SODIUM 8.6-50 MG PO TABS: 1.0000 | ORAL_TABLET | Freq: Every evening | ORAL | Status: DC | PRN

## 2020-09-16 MED ORDER — LEVETIRACETAM 500 MG PO TABS
500.0000 mg | ORAL_TABLET | Freq: Two times a day (BID) | ORAL | Status: DC
  Administered 2020-09-16 – 2020-09-20 (×9): 500 mg via ORAL
  Filled 2020-09-16 (×9): qty 1

## 2020-09-16 NOTE — H&P (Signed)
History and Physical    Jeremy [name] [id number] DOB: May 28, 1945 DOA: 09/15/2020  PCP: Idelle Crouch, MD   Patient coming from: Facility  I have personally briefly reviewed patient's old medical records in [place]  Chief Complaint: Confusion  HPI: Jeremy [name] is a 75 y.o. male with medical history significant for seizure disorder, HTN, dementia who was sent to the ED for evaluation of confusion beyond his baseline for dementia associated with fever.  History is limited due to dementia, but patient is able to answer questions and states that he feels fine.  He is denying any cough or shortness of breath, chest pain.  Denies abdominal pain, nausea vomiting or diarrhea.  ED course: On arrival, temperature 100.7, BP 162/81 with pulse of 113 and O2 sat 99% on room air. WBC 5.4 with hemoglobin 8.2, with last Hb on record 9.5.  Creatinine 2.29, up from 1.3 a couple years prior.  Lactic acid normal at 1.5, procalcitonin less than 0.1.  Urinalysis unremarkable COVID PCR positive  EKG, personally viewed and interpreted: Sinus tachycardia at 110 with nonspecific ST-T wave changes  Imaging: Chest x-ray no acute cardio pulmonary process CT head with tiny remote lacunar infarcts within the basal ganglia on the left otherwise no acute intracranial abnormality  Patient initially started on sepsis fluids given fever and tachycardia.  Subsequently started on COVID protocol with remdesivir.  Hospitalist consulted for admission.  Review of Systems: Unable to obtain due to dementia  Past Medical History:  Diagnosis Date   Cancer ([place]) 2019   prostate /radiation   Dementia ([place])    Hypertension    Seizures ([place])    LAST 6 MO  AGO    Past Surgical History:  Procedure Laterality Date   none     PROSTATE BIOPSY N/A 12/15/2017   Procedure: BIOPSY TRANSRECTAL ULTRASONIC PROSTATE (TUBP);  Surgeon: [name], MD;  Location: [facility] ORS;  Service: Urology;   Laterality: N/A;   ROBOT ASSISTED INGUINAL HERNIA REPAIR Right 09/02/2018   Procedure: ROBOT ASSISTED INGUINAL HERNIA REPAIR AND UMBILICAL HERNIA, RIGHT;  Surgeon: Jules Husbands, MD;  Location: [facility] ORS;  Service: General;  Laterality: Right;     reports that he has quit smoking. He has never used smokeless tobacco. He reports previous alcohol use. He reports previous drug use. Drugs: Cocaine and IV.  No Known Allergies  Family history: Unable to obtain due to dementia   Prior to Admission medications   Medication Sig Start Date End Date Taking? Authorizing Provider  acetaminophen (TYLENOL) 325 MG tablet Take 650 mg by mouth every 4 (four) hours as needed for moderate pain, fever or headache.   Yes [provider]  alum & mag hydroxide-simeth (MAALOX/MYLANTA) 200-200-20 MG/5ML suspension Take 30 mLs by mouth daily as needed for indigestion or heartburn.   Yes [provider]  bismuth subsalicylate (PEPTO BISMOL) 262 MG/15ML suspension Take 15 mLs by mouth every 4 (four) hours as needed (for nausea).   Yes [provider]  CALCIUM + VITAMIN D3 600-10 MG-MCG TABS TAKE 1 TABLET BY MOUTH 2 TIMES DAILY 08/25/20  Yes Stoioff, Ronda Fairly, MD  guaiFENesin (ROBITUSSIN) 100 MG/5ML liquid Take 200 mg by mouth every 6 (six) hours as needed for cough.   Yes [provider]  lamoTRIgine (LAMICTAL) 25 MG tablet Take 50 mg by mouth 2 (two) times daily.  03/02/18  Yes [provider]  levETIRAcetam (KEPPRA) 500 MG tablet Take 500 mg by mouth 2 (  two) times daily. 01/27/19 09/15/20 Yes [provider]  loperamide (IMODIUM) 2 MG capsule Take 2 mg by mouth 4 (four) times daily as needed for diarrhea or loose stools.   Yes [provider]  losartan-hydrochlorothiazide (HYZAAR) 100-25 MG tablet Take 1 tablet by mouth daily.   Yes [provider]  magnesium hydroxide (MILK OF MAGNESIA) 400 MG/5ML suspension Take 30 mLs by mouth daily as needed for mild  constipation.   Yes [provider]  olmesartan (BENICAR) 40 MG tablet Take 40 mg by mouth daily. 08/22/20  Yes [provider]  risperiDONE (RISPERDAL) 2 MG tablet Take 1 mg by mouth 3 (three) times daily.   Yes [provider]  rosuvastatin (CRESTOR) 10 MG tablet Take 10 mg by mouth daily. 11/02/19 11/01/20 Yes [provider]  tamsulosin (FLOMAX) 0.4 MG CAPS capsule Take 0.4 mg by mouth daily after supper. 07/01/19  Yes [provider]    Physical Exam: Vitals:   09/15/20 2330 09/15/20 2332 09/15/20 2345 09/16/20 0005  BP: 121/72   (!) 149/74  Pulse: 82  87 84  Resp: '14  16 16  '$ Temp:  98.3 F (36.8 C)  98.6 F (37 C)  TempSrc:  Oral  Oral  SpO2: 98%  100% 100%  Weight:      Height:         Vitals:   09/15/20 2330 09/15/20 2332 09/15/20 2345 09/16/20 0005  BP: 121/72   (!) 149/74  Pulse: 82  87 84  Resp: '14  16 16  '$ Temp:  98.3 F (36.8 C)  98.6 F (37 C)  TempSrc:  Oral  Oral  SpO2: 98%  100% 100%  Weight:      Height:          Constitutional: Lethargic and oriented x 2 . Not in any apparent distress HEENT:      Head: Normocephalic and atraumatic.         Eyes: PERLA, EOMI, Conjunctivae are normal. Sclera is non-icteric.       Mouth/Throat: Mucous membranes are moist.       Neck: Supple with no signs of meningismus. Cardiovascular: Regular rate and rhythm. No murmurs, gallops, or rubs. 2+ symmetrical distal pulses are present . No JVD. No LE edema Respiratory: Respiratory effort normal .Lungs sounds clear bilaterally. No wheezes, crackles, or rhonchi.  Gastrointestinal: Soft, non tender, and non distended with positive bowel sounds.  Genitourinary: No CVA tenderness. Musculoskeletal: Nontender with normal range of motion in all extremities. No cyanosis, or erythema of extremities. Neurologic:  Face is symmetric. Moving all extremities. No gross focal neurologic deficits . Skin: Skin is warm, dry.  No rash or  ulcers Psychiatric: Mood and affect are appropriate    Labs on Admission: I have personally reviewed following labs and imaging studies  CBC: Recent Labs  Lab 09/15/20 2017  WBC 5.4  NEUTROABS 4.1  HGB 8.2*  HCT 24.3*  MCV 88.4  PLT 99991111   Basic Metabolic Panel: Recent Labs  Lab 09/15/20 2017  NA 140  K 3.7  CL 108  CO2 23  GLUCOSE 116*  BUN 29*  CREATININE 2.29*  CALCIUM 9.7  MG 1.9   GFR: Estimated Creatinine Clearance: 27.4 mL/min (A) (by C-G formula based on SCr of 2.29 mg/dL (H)). Liver Function Tests: Recent Labs  Lab 09/15/20 2017  AST 27  ALT 12  ALKPHOS 47  BILITOT 0.6  PROT 7.5  ALBUMIN 4.2   No results for input(s): LIPASE,  AMYLASE in the last 168 hours. No results for input(s): AMMONIA in the last 168 hours. Coagulation Profile: Recent Labs  Lab 09/15/20 2017  INR 1.1   Cardiac Enzymes: No results for input(s): CKTOTAL, CKMB, CKMBINDEX, TROPONINI in the last 168 hours. BNP (last 3 results) No results for input(s): PROBNP in the last 8760 hours. HbA1C: No results for input(s): HGBA1C in the last 72 hours. CBG: No results for input(s): GLUCAP in the last 168 hours. Lipid Profile: No results for input(s): CHOL, HDL, LDLCALC, TRIG, CHOLHDL, LDLDIRECT in the last 72 hours. Thyroid Function Tests: No results for input(s): TSH, T4TOTAL, FREET4, T3FREE, THYROIDAB in the last 72 hours. Anemia Panel: No results for input(s): VITAMINB12, FOLATE, FERRITIN, TIBC, IRON, RETICCTPCT in the last 72 hours. Urine analysis:    Component Value Date/Time   COLORURINE STRAW (A) 09/15/2020 2017   APPEARANCEUR CLEAR (A) 09/15/2020 2017   APPEARANCEUR Hazy 08/14/2013 2304   LABSPEC 1.011 09/15/2020 2017   LABSPEC 1.010 08/14/2013 2304   PHURINE 5.0 09/15/2020 2017   GLUCOSEU NEGATIVE 09/15/2020 2017   GLUCOSEU Negative 08/14/2013 2304   HGBUR MODERATE (A) 09/15/2020 2017   BILIRUBINUR NEGATIVE 09/15/2020 2017   BILIRUBINUR Negative 08/14/2013 2304    KETONESUR NEGATIVE 09/15/2020 2017   PROTEINUR NEGATIVE 09/15/2020 2017   NITRITE NEGATIVE 09/15/2020 2017   LEUKOCYTESUR NEGATIVE 09/15/2020 2017   LEUKOCYTESUR Negative 08/14/2013 2304    Radiological Exams on Admission: CT Head Wo Contrast  Result Date: 09/15/2020 CLINICAL DATA:  Altered mental status EXAM: CT HEAD WITHOUT CONTRAST TECHNIQUE: Contiguous axial images were obtained from the base of the skull through the vertex without intravenous contrast. COMPARISON:  07/10/2017 FINDINGS: Brain: Normal anatomic configuration. Parenchymal volume loss is commensurate with the patient's age. Mild periventricular white matter changes are present likely reflecting the sequela of small vessel ischemia. Tiny remote lacunar infarcts are noted within the a left caudate and lentiform nucleus. No abnormal intra or extra-axial mass lesion or fluid collection. No abnormal mass effect or midline shift. No evidence of acute intracranial hemorrhage or infarct. Ventricular size is normal. Cerebellum unremarkable. Vascular: No asymmetric hyperdense vasculature at the skull base. Skull: Intact Sinuses/Orbits: Mild mucosal thickening within the right maxillary sinus with small layering fluid. Bilateral maxillary antrostomy has been performed. Minimal layering secretions within the left sphenoid sinus. Remaining paranasal sinuses are clear. Orbits are unremarkable. Other: Mastoid air cells and middle ear cavities are clear. IMPRESSION: No acute intracranial abnormality. Mild senescent change. 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 Mild paranasal sinus disease. Electronically Signed   By: Fidela Salisbury MD   On: 09/15/2020 21:22   DG Chest Port 1 View  Result Date: 09/15/2020 CLINICAL DATA:  Questionable sepsis - evaluate for abnormality Fever today. EXAM: PORTABLE CHEST 1 VIEW COMPARISON:  Chest radiograph 06/25/2017 FINDINGS: Stable cardiomediastinal contours. Borderline cardiomegaly the lungs are  clear. Pulmonary vasculature is normal. No consolidation, pleural effusion, or pneumothorax. No acute osseous abnormalities are seen. IMPRESSION: Stable borderline cardiomegaly.  No acute pulmonary process. Electronically Signed   By: Keith Rake M.D.   On: 09/15/2020 20:53     Assessment/Plan 75 year old male with history of seizure disorder, HTN, dementia presenting with acute confusion in the setting of fever.      COVID-19 virus infection   Acute confusion due to infection - Confusion secondary to COVID infection symptomatic for confusion and fever - Continue remdesivir and vitamins - Airborne precautions - Patient got sepsis fluids in the ED but procalcitonin  less than 0.1 so sepsis unlikely    AKI (acute kidney injury) ([place]) - Creatinine 2.29.  Should improve with fluid bolus from ED - Continue to monitor    Dementia without behavioral disturbance -stable    Hypertension - Continue home antihypertensives    Seizures ([facility]) - Continue Keppra and Lamictal    DVT prophylaxis: Lovenox  Code Status: full code  Family Communication:  none  Disposition Plan: Back to previous home environment Consults called: none  Status:At the time of admission, it appears that the appropriate admission status for this patient is INPATIENT. This is judged to be reasonable and necessary in order to provide the required intensity of service to ensure the patient's safety given the presenting symptoms, physical exam findings, and initial radiographic and laboratory data in the context of their  Comorbid conditions.   Patient requires inpatient status due to high intensity of service, high risk for further deterioration and high frequency of surveillance required.   I certify that at the point of admission it is my clinical judgment that the patient will require inpatient hospital care spanning beyond [place] MD [facility]     09/16/2020, 1:20 AM

## 2020-09-16 NOTE — Progress Notes (Addendum)
PROGRESS NOTE  Jeremy [name]  DOB: 03/14/1945  PCP: Idelle Crouch, MD LQ:[id number]  DOA: 09/15/2020  LOS: 1 day  Hospital Day: 2   Chief Complaint  Patient presents with   Altered Mental Status    C/o AMS with fever today. Pt. From SNF. PT. Received 500LR via EMS en route.    Brief narrative: Jeremy [name] is a 75 y.o. male with PMH significant for HTN, dementia, seizure disorder.   Patient was brought from SNF to the ED for fever and confusion worse than baseline. In the ED, patient had a temperature of 100.7, heart rate 113, blood pressure elevated to 162/81, continue oxygen saturation at 90% on room air. Labs with WBC count at 5.4, creatinine at 2.29 which is up from 1.3 from 2019 COVID PCR positive EKG sinus tachycardia at 110 bpm Chest x-ray did not show any acute cardiopulmonary process CT head did not show any acute intracranial event but showed tiny remote lacunar infarcts within the basal ganglia on the left.   Admitted to hospitalist service  Subjective: Patient was seen and examined this morning.  Elderly African-American male, not in distress.  Feels very weak. Slow to respond but answers questions.  Oriented to place and person not to time. No recurrence of fever overnight.  Hemodynamically stable Labs this morning with creatinine gradually improving at 2.13, hemoglobin low at 8  Assessment/Plan: Acute metabolic encephalopathy Dementia -Presented with delirium on dementia likely due to COVID-19 infection, dehydration -This morning, patient is oriented to place and person, not to time.  Not restless or agitated -continue to monitor mental status change.  COVID-19 infection -PCR positive in the ED.  Chest x-ray without pneumonia. -Continue IV remdesivir.   -Not hypoxic, not on steroids -Airborne precautions. Recent Labs  Lab 09/15/20 2017 09/16/20 0034 09/16/20 0349  SARSCOV2NAA POSITIVE*  --   --   WBC 5.4  --  4.7  LATICACIDVEN 1.5  1.1  --   PROCALCITON <0.10  --  <0.10  ALT 12  --   --    SIRS -POA -Meets SIRS criteria on admission with fever, tachycardia numbers related to COVID infection.  -No evidence of bacterial infection at this time.  AKI on CKD 2 -Baseline creatinine 1.31 from 2020.  Presented with creatinine elevated to 2.1. Improving with IV fluid. -Continue LR at reduced rate of 75 mL mill per hour for now.  Essential hypertension -Home meds include losartan 100 mg daily, HCTZ 25 mg daily, unclear if the patient is also taking Benicar 40 mg daily. -Currently meds on hold because of AKI.  Continue to monitor blood pressure for next 24 hours.  Hydralazine as needed ordered.  Anemia -Baseline hemoglobin more than 10 from 2020.  Presented with a hemoglobin low at 8.2.  Continue to monitor.  No active bleeding. Recent Labs    09/15/20 2017 09/16/20 0349  HGB 8.2* 8.0*  MCV 88.4 87.6   History of seizure disorder -Continue Keppra and Lamictal.  BPH -Flomax 0.4 mg daily  Mobility: PT eval ordered. Code Status:   Code Status: Full Code  Nutritional status: Body mass index is 23.11 kg/m.     Diet:  Diet Order             Diet Heart Room service appropriate? Yes; Fluid consistency: Thin  Diet effective now                  DVT prophylaxis:  enoxaparin (LOVENOX) injection 30 mg Start:  09/16/20 0800 SCDs Start: 09/16/20 0119   Antimicrobials: IV remdesivir Fluid: LR'@75'$  mill per hour Consultants: None Family Communication: None at bedside.  Status is: Inpatient  Remains inpatient appropriate because: IV remdesivir, IV hydration  Dispo: The patient is from: Nursing facility              Anticipated d/c is to: Pending PT eval              Patient currently is not medically stable to d/c.   Difficult to place patient No     Infusions:   lactated ringers 75 mL/hr at 09/16/20 1058   remdesivir 100 mg in NS 100 mL 100 mg (09/16/20 1104)    Scheduled Meds:  vitamin C  500 mg  Oral Daily   enoxaparin (LOVENOX) injection  30 mg Subcutaneous Q24H   lamoTRIgine  50 mg Oral BID   levETIRAcetam  500 mg Oral BID   risperiDONE  1 mg Oral TID   rosuvastatin  10 mg Oral QHS   tamsulosin  0.4 mg Oral QPC supper   zinc sulfate  220 mg Oral Daily    Antimicrobials: Anti-infectives (From admission, onward)    Start     Dose/Rate Route Frequency Ordered Stop   09/16/20 1000  remdesivir 100 mg in sodium chloride 0.9 % 100 mL IVPB       See Hyperspace for full Linked Orders Report.   100 mg 200 mL/hr over 30 Minutes Intravenous Daily 09/15/20 2202 09/20/20 0959   09/15/20 2300  remdesivir 200 mg in sodium chloride 0.9% 250 mL IVPB       See Hyperspace for full Linked Orders Report.   200 mg 580 mL/hr over 30 Minutes Intravenous Once 09/15/20 2202 09/15/20 2337   09/15/20 2145  cefTRIAXone (ROCEPHIN) 1 g in sodium chloride 0.9 % 100 mL IVPB  Status:  Discontinued        1 g 200 mL/hr over 30 Minutes Intravenous  Once 09/15/20 2142 09/15/20 2155       PRN meds: acetaminophen, guaiFENesin, ondansetron **OR** ondansetron (ZOFRAN) IV, senna-docusate   Objective: Vitals:   09/16/20 0523 09/16/20 0837  BP: (!) 147/70 (!) 145/67  Pulse: 87 90  Resp: 18 16  Temp: 99 F (37.2 C)   SpO2: 99% 100%    Intake/Output Summary (Last 24 hours) at 09/16/2020 1108 Last data filed at 09/15/2020 2337 Gross per 24 hour  Intake 1788.01 ml  Output --  Net 1788.01 ml   Filed Weights   09/15/20 2010  Weight: 68.9 kg   Weight change:  Body mass index is 23.11 kg/m.   Physical Exam: General exam: Pleasant, elderly African-American male.  Not in physical distress Skin: No rashes, lesions or ulcers. HEENT: Atraumatic, normocephalic, no obvious bleeding Lungs: Clear to auscultation bilaterally CVS: Regular rate and rhythm, no murmur GI/Abd soft, nontender, nondistended, bowel sound present CNS: Alert, awake, slow to respond, oriented to place and person Psychiatry:  Depressed look Extremities: No pedal edema, no calf tenderness  Data Review: I have personally reviewed the laboratory data and studies available.  Recent Labs  Lab 09/15/20 2017 09/16/20 0349  WBC 5.4 4.7  NEUTROABS 4.1  --   HGB 8.2* 8.0*  HCT 24.3* 24.0*  MCV 88.4 87.6  PLT 189 170   Recent Labs  Lab 09/15/20 2017 09/16/20 0349  NA 140  --   K 3.7  --   CL 108  --   CO2 23  --  GLUCOSE 116*  --   BUN 29*  --   CREATININE 2.29* 2.13*  CALCIUM 9.7  --   MG 1.9  --     F/u labs ordered. Unresulted Labs (From admission, onward)     Start     Ordered   09/23/20 0500  Creatinine, serum  (enoxaparin (LOVENOX)    CrCl < 30 ml/min)  Weekly,   TIMED     Comments: while on enoxaparin therapy.    09/16/20 0120   09/17/20 0500  CBC with Differential/Platelet  Daily,   R      09/16/20 0120   09/17/20 0500  Comprehensive metabolic panel  Daily,   R      09/16/20 0120   09/17/20 0500  C-reactive protein  Daily,   R      09/16/20 0120   09/17/20 0500  D-dimer, quantitative  Daily,   R      09/16/20 0120   09/16/20 0500  Procalcitonin  Daily,   STAT      09/15/20 2009   09/15/20 2045  Lamotrigine level  Once,   STAT        09/15/20 2045   09/15/20 2009  Urine Culture  (Septic presentation on arrival (screening labs, nursing and treatment orders for obvious sepsis))  ONCE - STAT,   STAT       Question:  Indication  Answer:  Sepsis   09/15/20 2009            Signed, Terrilee Croak, MD [facility] 09/16/2020

## 2020-09-17 DIAGNOSIS — U071 COVID-19: Secondary | ICD-10-CM | POA: Diagnosis not present

## 2020-09-17 LAB — CBC WITH DIFFERENTIAL/PLATELET
Abs Immature Granulocytes: 0 10*3/uL (ref 0.00–0.07)
Basophils Absolute: 0 10*3/uL (ref 0.0–0.1)
Basophils Relative: 0 %
Eosinophils Absolute: 0 10*3/uL (ref 0.0–0.5)
Eosinophils Relative: 1 %
HCT: 22 % — ABNORMAL LOW (ref 39.0–52.0)
Hemoglobin: 7.4 g/dL — ABNORMAL LOW (ref 13.0–17.0)
Immature Granulocytes: 0 %
Lymphocytes Relative: 25 %
Lymphs Abs: 0.8 10*3/uL (ref 0.7–4.0)
MCH: 30 pg (ref 26.0–34.0)
MCHC: 33.6 g/dL (ref 30.0–36.0)
MCV: 89.1 fL (ref 80.0–100.0)
Monocytes Absolute: 0.5 10*3/uL (ref 0.1–1.0)
Monocytes Relative: 16 %
Neutro Abs: 1.8 10*3/uL (ref 1.7–7.7)
Neutrophils Relative %: 58 %
Platelets: 158 10*3/uL (ref 150–400)
RBC: 2.47 MIL/uL — ABNORMAL LOW (ref 4.22–5.81)
RDW: 14.5 % (ref 11.5–15.5)
WBC: 3 10*3/uL — ABNORMAL LOW (ref 4.0–10.5)
nRBC: 0 % (ref 0.0–0.2)

## 2020-09-17 LAB — COMPREHENSIVE METABOLIC PANEL
ALT: 15 U/L (ref 0–44)
AST: 47 U/L — ABNORMAL HIGH (ref 15–41)
Albumin: 3.2 g/dL — ABNORMAL LOW (ref 3.5–5.0)
Alkaline Phosphatase: 32 U/L — ABNORMAL LOW (ref 38–126)
Anion gap: 8 (ref 5–15)
BUN: 26 mg/dL — ABNORMAL HIGH (ref 8–23)
CO2: 24 mmol/L (ref 22–32)
Calcium: 8.4 mg/dL — ABNORMAL LOW (ref 8.9–10.3)
Chloride: 107 mmol/L (ref 98–111)
Creatinine, Ser: 2.11 mg/dL — ABNORMAL HIGH (ref 0.61–1.24)
GFR, Estimated: 32 mL/min — ABNORMAL LOW (ref >60)
Glucose, Bld: 93 mg/dL (ref 70–99)
Potassium: 3.7 mmol/L (ref 3.5–5.1)
Sodium: 139 mmol/L (ref 135–145)
Total Bilirubin: 0.4 mg/dL (ref 0.3–1.2)
Total Protein: 6.2 g/dL — ABNORMAL LOW (ref 6.5–8.1)

## 2020-09-17 LAB — C-REACTIVE PROTEIN: CRP: 9.1 mg/dL — ABNORMAL HIGH (ref <1.0)

## 2020-09-17 LAB — URINE CULTURE: Culture: NO GROWTH

## 2020-09-17 LAB — PROCALCITONIN: Procalcitonin: <0.1 ng/mL

## 2020-09-17 LAB — D-DIMER, QUANTITATIVE: D-Dimer, Quant: 0.47 ug/mL-FEU (ref 0.00–0.50)

## 2020-09-17 NOTE — Evaluation (Signed)
Physical Therapy Evaluation Patient Details Name: Jeremy [name] MRN: [id number] DOB: [birth year] Today's Date: 09/17/2020   History of Present Illness  Pt admitted to [facility] on 09/15/20 from nursing facility with concerns for acute confusion in setting of a fever secondary to (+) COVID test. Significant PMH includes: hx of prostate CA s/p radiation therapy, HTN, HDL, seizure disorder on Keppra and Lamictal.   Clinical Impression  Pt is a 75 year old M admitted to hospital on 09/15/20 for COVID. Pt questionable historian of health due to baseline dementia. Pt resides at [facility] and states he was Ind with all ADL's and ambulation without AD. Pt presents with generalized weakness, decreased activity tolerance, decreased standing balance, and confusion, resulting in impaired functional mobility. Due to deficits, pt required mod I for bed mobility, CGA for transfers, and CGA for short distance without AD. Due to decreased standing balance and gait deficits, pt may benefit from RW for energy conservation/improved balance with upright mobility. Deficits limit the pt's ability to safely and independently perform ADL's, transfer, and ambulate. Pt will benefit from acute skilled PT services to address deficits for return to baseline function. At this time, PT recommends return to facility with rehab, with RW for energy conservation/improved safety with upright mobility.     Follow Up Recommendations Supervision/Assistance - 24 hour;Home health PT (return to [facility])    Equipment Recommendations  Rolling walker with 5" wheels    Recommendations for Other Services       Precautions / Restrictions Precautions Precautions: Fall Restrictions Weight Bearing Restrictions: No      Mobility  Bed Mobility Overal bed mobility: Modified Independent             General bed mobility comments: Mod I to sit EOB with HOB flat and use of BUE for support     Transfers Overall transfer level: Needs assistance Equipment used: None Transfers: Sit to/from Stand Sit to Stand: Min guard         General transfer comment: CGA for safety to perform STS from EOB without AD; wide BOS noted with initial standing  Ambulation/Gait Ambulation/Gait assistance: Min guard Gait Distance (Feet): 15 Feet Assistive device: None       General Gait Details: CGA for safety to ambulate in room without AD. Pt demonstrates slowed cadence, early reciprocal gait, decreased step length/foot clearance bil, and imbalance requiring UE support from bed for steadying.      Balance Overall balance assessment: Needs assistance Sitting-balance support: Feet supported;No upper extremity supported Sitting balance-Leahy Scale: Fair Sitting balance - Comments: No LOB with anterior scooting   Standing balance support: During functional activity;No upper extremity supported Standing balance-Leahy Scale: Fair Standing balance comment: intermittent UE support on furniture for steadying          Pertinent Vitals/Pain Pain Assessment: No/denies pain    Home Living Family/patient expects to be discharged to:: Skilled nursing facility         Prior Function Level of Independence: Independent         Comments: Pt questionable historian of health secondary to baseline dementia. Reports being Ind with all ADL's and ambulation at facility.     Hand Dominance        Extremity/Trunk Assessment   Upper Extremity Assessment Upper Extremity Assessment: Generalized weakness (unable to formally assess due to cognition; observed to be at least 3+/5. Dupuytren's contracture R hand digits 4-5)    Lower Extremity Assessment Lower Extremity Assessment: Generalized  weakness (unable to formally assess due to cognition; observed to be at least 3+/5.)    Cervical / Trunk Assessment Cervical / Trunk Assessment: Kyphotic  Communication      Cognition Arousal/Alertness:  Awake/alert Behavior During Therapy: WFL for tasks assessed/performed Overall Cognitive Status: History of cognitive impairments - at baseline          General Comments: A&O x4; able to state month with choices. Able to follow 100% of simple 2-step commands, with increased time for processing         Exercises Other Exercises Other Exercises: Pt able to participate in bed mobility, transfers, and gait without AD. Required grossly CGA for safety for transfers/gait. May benefit from [place] for steadying. Other Exercises: Pt educated regarding: PT role/POC, DC recommendations, benefits of participation/OOB mobility, and safety with functional mobility.   Assessment/Plan    PT Assessment Patient needs continued PT services  PT Problem List Decreased strength;Decreased activity tolerance;Decreased balance;Decreased mobility;Decreased cognition;Decreased safety awareness       PT Treatment Interventions Gait training;Functional mobility training;Therapeutic activities;Therapeutic exercise;Balance training;Neuromuscular re-education    PT Goals (Current goals can be found in the Care Plan section)  Acute Rehab PT Goals PT Goal Formulation: Patient unable to participate in goal setting    Frequency Min 2X/week    AM-PAC PT "6 Clicks" Mobility  Outcome Measure Help needed turning from your back to your side while in a flat bed without using bedrails?: None Help needed moving from lying on your back to sitting on the side of a flat bed without using bedrails?: None Help needed moving to and from a bed to a chair (including a wheelchair)?: A Little Help needed standing up from a chair using your arms (e.g., wheelchair or bedside chair)?: A Little Help needed to walk in hospital room?: A Little Help needed climbing 3-5 steps with a railing? : A Lot 6 Click Score: 19    End of Session Equipment Utilized During Treatment: Gait belt Activity Tolerance: Patient tolerated treatment  well;Patient limited by fatigue Patient left: in bed;with call bell/phone within reach;with bed alarm set Nurse Communication: Mobility status PT Visit Diagnosis: Unsteadiness on feet (R26.81);Muscle weakness (generalized) (M62.81)    Time: [id number] PT Time Calculation (min) (ACUTE ONLY): 16 min   Charges:   PT Evaluation $PT Eval Low Complexity: 1 Low PT Treatments $Therapeutic Activity: 8-22 mins       Herminio Commons, PT, DPT 2:59 PM,09/17/20

## 2020-09-17 NOTE — Progress Notes (Addendum)
PROGRESS NOTE  Jeremy [name]  DOB: Nov 07, 1945  PCP: Idelle Crouch, MD NAT:[id number]  DOA: 09/15/2020  LOS: 2 days  Hospital Day: 3   Chief Complaint  Patient presents with   Altered Mental Status    C/o AMS with fever today. Pt. From SNF. PT. Received 500LR via EMS en route.    Brief narrative: Jeremy [name] is a 75 y.o. male with PMH significant for HTN, dementia, seizure disorder.   Patient was brought from a group home to the ED for fever and confusion worse than baseline. In the ED, patient had a temperature of 100.7, heart rate 113, blood pressure elevated to 162/81, continue oxygen saturation at 90% on room air. Labs with WBC count at 5.4, creatinine at 2.29 which is up from 1.3 from 2019 COVID PCR positive EKG sinus tachycardia at 110 bpm Chest x-ray did not show any acute cardiopulmonary process CT head did not show any acute intracranial event but showed tiny remote lacunar infarcts within the basal ganglia on the left.   Admitted to hospitalist service  Subjective: Patient was seen and examined this morning.  Elderly African-American male, not in distress.   Alert, awake, oriented to place and person.  Not to time.   Per RN, he was able to stand up earlier with staff.   T-max 100.2 in last 24 hours.   Labs this morning with creatinine down to 2.11 and hemoglobin down to 7.4.    Assessment/Plan: Acute metabolic encephalopathy Dementia -Presented with delirium on dementia likely due to COVID-19 infection, dehydration -At this time, patient is alert, awake, oriented to place and person, not to time.  Not restless or agitated.   -Continue to monitor mental status  COVID-19 infection -PCR positive in the ED.  Chest x-ray without pneumonia. -Currently on a short course of IV remdesivir.   -Not hypoxic, not on steroids -Airborne precautions. Recent Labs  Lab 09/15/20 2017 09/16/20 0034 09/16/20 0349 09/17/20 0343  SARSCOV2NAA POSITIVE*  --    --   --   WBC 5.4  --  4.7 3.0*  LATICACIDVEN 1.5 1.1  --   --   PROCALCITON <0.10  --  <0.10 <0.10  DDIMER  --   --   --  0.47  ALT 12  --   --  15    SIRS -POA -Met SIRS criteria on admission with fever, tachycardia numbers related to COVID infection.  -No evidence of bacterial infection at this time. -T-max 100.2 in last 24 hours.  But WBC count normal, lactic acid, procalcitonin level normal.  Not on antibiotics.  AKI on CKD 2 -Baseline creatinine 1.31 from 2020.  Presented with creatinine elevated to 2.3.  Gradually improving with IV fluid. -Continue LR at 75 mL mill per hour for now. Recent Labs    09/15/20 2017 09/16/20 0349 09/17/20 0343  BUN 29*  --  26*  CREATININE 2.29* 2.13* 2.11*   Essential hypertension -Home meds include losartan 100 mg daily, HCTZ 25 mg daily, unclear if the patient is also taking Benicar 40 mg daily. -Currently meds on hold because of AKI.  Continue to monitor blood pressure for next 24 hours.   -Blood pressure mostly 140s in last 24 hours.  Hydralazine as needed ordered.  Anemia -Baseline hemoglobin more than 10 from 2020.  Presented with a hemoglobin low at 8.2.  No evidence of active bleeding but hemoglobin is further down to 7.4 this morning.  Probably dilutional.  Continue to monitor. Recent  Labs    09/15/20 2017 09/16/20 0349 09/17/20 0343  HGB 8.2* 8.0* 7.4*  MCV 88.4 87.6 89.1   History of seizure disorder -Continue Keppra and Lamictal.  BPH -Flomax 0.4 mg daily  Mobility: PT eval waited for. Code Status:   Code Status: Full Code  Nutritional status: Body mass index is 23.11 kg/m.     Diet:  Diet Order             Diet Heart Room service appropriate? Yes; Fluid consistency: Thin  Diet effective now                  DVT prophylaxis:  enoxaparin (LOVENOX) injection 30 mg Start: 09/16/20 0800 SCDs Start: 09/16/20 0119   Antimicrobials: IV remdesivir Fluid: LR'@75'  mill per hour Consultants: None Family  Communication: None at bedside.  Status is: Inpatient  Remains inpatient appropriate because: IV remdesivir, IV hydration  Dispo: The patient is from: group home              Anticipated d/c is to: Back to group home              Patient currently is not medically stable to d/c.   Difficult to place patient No     Infusions:   remdesivir 100 mg in NS 100 mL 100 mg (09/17/20 0803)    Scheduled Meds:  vitamin C  500 mg Oral Daily   enoxaparin (LOVENOX) injection  30 mg Subcutaneous Q24H   lamoTRIgine  50 mg Oral BID   levETIRAcetam  500 mg Oral BID   risperiDONE  1 mg Oral TID   rosuvastatin  10 mg Oral QHS   tamsulosin  0.4 mg Oral QPC supper   zinc sulfate  220 mg Oral Daily    Antimicrobials: Anti-infectives (From admission, onward)    Start     Dose/Rate Route Frequency Ordered Stop   09/16/20 1000  remdesivir 100 mg in sodium chloride 0.9 % 100 mL IVPB       See Hyperspace for full Linked Orders Report.   100 mg 200 mL/hr over 30 Minutes Intravenous Daily 09/15/20 2202 09/20/20 0959   09/15/20 2300  remdesivir 200 mg in sodium chloride 0.9% 250 mL IVPB       See Hyperspace for full Linked Orders Report.   200 mg 580 mL/hr over 30 Minutes Intravenous Once 09/15/20 2202 09/15/20 2337   09/15/20 2145  cefTRIAXone (ROCEPHIN) 1 g in sodium chloride 0.9 % 100 mL IVPB  Status:  Discontinued        1 g 200 mL/hr over 30 Minutes Intravenous  Once 09/15/20 2142 09/15/20 2155       PRN meds: acetaminophen, guaiFENesin, ondansetron **OR** ondansetron (ZOFRAN) IV, senna-docusate   Objective: Vitals:   09/16/20 2043 09/17/20 0429  BP: (!) 143/73 110/64  Pulse: 81 79  Resp: 16 16  Temp: (!) 100.7 F (38.2 C) 100.2 F (37.9 C)  SpO2: 100% 97%    Intake/Output Summary (Last 24 hours) at 09/17/2020 0930 Last data filed at 09/16/2020 1705 Gross per 24 hour  Intake 1100 ml  Output --  Net 1100 ml    Filed Weights   09/15/20 2010  Weight: 68.9 kg   Weight change:   Body mass index is 23.11 kg/m.   Physical Exam: General exam: Pleasant, elderly African-American male.  Not in physical distress Skin: No rashes, lesions or ulcers. HEENT: Atraumatic, normocephalic, no obvious bleeding Lungs: Clear to auscultate bilaterally CVS: Regular rate and  rhythm, no murmur GI/Abd soft, nontender, nondistended, bowel sound present CNS: Alert, awake, slow to respond, oriented to place and person.  Not to time. Psychiatry: Depressed look Extremities: No pedal edema, no calf tenderness  Data Review: I have personally reviewed the laboratory data and studies available.  Recent Labs  Lab 09/15/20 2017 09/16/20 0349 09/17/20 0343  WBC 5.4 4.7 3.0*  NEUTROABS 4.1  --  1.8  HGB 8.2* 8.0* 7.4*  HCT 24.3* 24.0* 22.0*  MCV 88.4 87.6 89.1  PLT 189 170 158    Recent Labs  Lab 09/15/20 2017 09/16/20 0349 09/17/20 0343  NA 140  --  139  K 3.7  --  3.7  CL 108  --  107  CO2 23  --  24  GLUCOSE 116*  --  93  BUN 29*  --  26*  CREATININE 2.29* 2.13* 2.11*  CALCIUM 9.7  --  8.4*  MG 1.9  --   --      F/u labs ordered. Unresulted Labs (From admission, onward)     Start     Ordered   09/23/20 0500  Creatinine, serum  (enoxaparin (LOVENOX)    CrCl < 30 ml/min)  Weekly,   TIMED     Comments: while on enoxaparin therapy.    09/16/20 0120   09/17/20 0500  CBC with Differential/Platelet  Daily,   R      09/16/20 0120   09/17/20 0500  Comprehensive metabolic panel  Daily,   R      09/16/20 0120   09/17/20 0500  C-reactive protein  Daily,   R      09/16/20 0120   09/17/20 0500  D-dimer, quantitative  Daily,   R      09/16/20 0120   09/15/20 2045  Lamotrigine level  Once,   STAT        09/15/20 2045            Signed, Terrilee Croak, MD [facility] 09/17/2020

## 2020-09-17 NOTE — TOC Initial Note (Addendum)
Transition of Care [facility]) - Initial/Assessment Note    Patient Details  Name: Jeremy [name] MRN: [id number] Date of Birth: July 30, 1945  Transition of Care [facility] CM/SW Contact:    Jeremy Chroman, LCSW Phone Number: 09/17/2020, 10:42 AM  Clinical Narrative:   Patient not fully oriented. Patient is from [facility] in [place] the owner, Jeremy [name]. He is aware of positive COVID test and wants him to quarantine for at least 5 days and be asymptomatic before he returns. Patient was not receiving home health prior to admission and was able to walk around the facility. No further concerns. CSW encouraged Jeremy [name] to contact CSW as needed. CSW will continue to follow patient for support and facilitate return to [facility] once medically stable.             Expected Discharge Plan: Group Home Barriers to Discharge: Continued Medical Work up   Patient Goals and CMS Choice        Expected Discharge Plan and Services Expected Discharge Plan: Group Home       Living arrangements for the past 2 months: Group Home                                      Prior Living Arrangements/Services Living arrangements for the past 2 months: Group Home Lives with:: Facility Resident Patient language and need for interpreter reviewed:: Yes Do you feel safe going back to the place where you live?: Yes      Need for Family Participation in Patient Care: Yes (Comment) Care giver support system in place?: Yes (comment)   Criminal Activity/Legal Involvement Pertinent to Current Situation/Hospitalization: No - Comment as needed  Activities of Daily Living Home Assistive Devices/Equipment: Wheelchair ADL Screening (condition at time of admission) Patient's cognitive ability adequate to safely complete daily activities?: No Is the patient deaf or have difficulty hearing?: No Does the patient have difficulty seeing, even when wearing glasses/contacts?: Yes Does the  patient have difficulty concentrating, remembering, or making decisions?: Yes Patient able to express need for assistance with ADLs?: Yes Does the patient have difficulty dressing or bathing?: Yes Independently performs ADLs?: No Communication: Needs assistance Is this a change from baseline?: Pre-admission baseline Dressing (OT): Needs assistance Is this a change from baseline?: Pre-admission baseline Grooming: Needs assistance Is this a change from baseline?: Pre-admission baseline Feeding: Needs assistance Is this a change from baseline?: Pre-admission baseline Bathing: Needs assistance Is this a change from baseline?: Pre-admission baseline Toileting: Needs assistance Is this a change from baseline?: Pre-admission baseline In/Out Bed: Needs assistance Is this a change from baseline?: Pre-admission baseline Walks in Home: Needs assistance Is this a change from baseline?: Pre-admission baseline Does the patient have difficulty walking or climbing stairs?: Yes Weakness of Legs: Both Weakness of Arms/Hands: Both  Permission Sought/Granted Permission sought to share information with : Facility Arts administrator granted to share info w AGENCY: Family Care Home        Emotional Assessment Appearance:: Appears stated age Attitude/Demeanor/Rapport: Unable to Assess Affect (typically observed): Unable to Assess Orientation: : Oriented to Self, Oriented to Place Alcohol / Substance Use: Not Applicable Psych Involvement: No (comment)  Admission diagnosis:  AKI (acute kidney injury) ([place]) [N17.9] Altered mental status, unspecified altered mental status type [R41.82] COVID [U07.1] COVID-19 virus infection [U07.1] Patient Active Problem List  Diagnosis Date Noted   Acute confusion due to infection 09/16/2020   AKI (acute kidney injury) ([place]) 09/16/2020   COVID-19 virus infection 09/15/2020   Prostate cancer ([place]) 06/11/2018   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 [place] [address] Phone: [phone] Fax: [phone]     Social Determinants of Health (SDOH) Interventions    Readmission Risk Interventions No flowsheet data found.

## 2020-09-18 DIAGNOSIS — U071 COVID-19: Secondary | ICD-10-CM | POA: Diagnosis not present

## 2020-09-18 LAB — COMPREHENSIVE METABOLIC PANEL
ALT: 15 U/L (ref 0–44)
AST: 38 U/L (ref 15–41)
Albumin: 3.3 g/dL — ABNORMAL LOW (ref 3.5–5.0)
Alkaline Phosphatase: 36 U/L — ABNORMAL LOW (ref 38–126)
Anion gap: 8 (ref 5–15)
BUN: 22 mg/dL (ref 8–23)
CO2: 26 mmol/L (ref 22–32)
Calcium: 8.5 mg/dL — ABNORMAL LOW (ref 8.9–10.3)
Chloride: 107 mmol/L (ref 98–111)
Creatinine, Ser: 1.97 mg/dL — ABNORMAL HIGH (ref 0.61–1.24)
GFR, Estimated: 35 mL/min — ABNORMAL LOW (ref >60)
Glucose, Bld: 92 mg/dL (ref 70–99)
Potassium: 3.5 mmol/L (ref 3.5–5.1)
Sodium: 141 mmol/L (ref 135–145)
Total Bilirubin: 0.5 mg/dL (ref 0.3–1.2)
Total Protein: 6.2 g/dL — ABNORMAL LOW (ref 6.5–8.1)

## 2020-09-18 LAB — CBC WITH DIFFERENTIAL/PLATELET
Abs Immature Granulocytes: 0.01 10*3/uL (ref 0.00–0.07)
Basophils Absolute: 0 10*3/uL (ref 0.0–0.1)
Basophils Relative: 1 %
Eosinophils Absolute: 0.1 10*3/uL (ref 0.0–0.5)
Eosinophils Relative: 4 %
HCT: 23.3 % — ABNORMAL LOW (ref 39.0–52.0)
Hemoglobin: 7.9 g/dL — ABNORMAL LOW (ref 13.0–17.0)
Immature Granulocytes: 0 %
Lymphocytes Relative: 24 %
Lymphs Abs: 0.7 10*3/uL (ref 0.7–4.0)
MCH: 29.8 pg (ref 26.0–34.0)
MCHC: 33.9 g/dL (ref 30.0–36.0)
MCV: 87.9 fL (ref 80.0–100.0)
Monocytes Absolute: 0.4 10*3/uL (ref 0.1–1.0)
Monocytes Relative: 14 %
Neutro Abs: 1.6 10*3/uL — ABNORMAL LOW (ref 1.7–7.7)
Neutrophils Relative %: 57 %
Platelets: 176 10*3/uL (ref 150–400)
RBC: 2.65 MIL/uL — ABNORMAL LOW (ref 4.22–5.81)
RDW: 14.6 % (ref 11.5–15.5)
WBC: 2.8 10*3/uL — ABNORMAL LOW (ref 4.0–10.5)
nRBC: 0 % (ref 0.0–0.2)

## 2020-09-18 LAB — D-DIMER, QUANTITATIVE: D-Dimer, Quant: 0.38 ug/mL-FEU (ref 0.00–0.50)

## 2020-09-18 LAB — LAMOTRIGINE LEVEL: Lamotrigine Lvl: 7 ug/mL (ref 2.0–20.0)

## 2020-09-18 LAB — C-REACTIVE PROTEIN: CRP: 7.1 mg/dL — ABNORMAL HIGH (ref <1.0)

## 2020-09-18 MED ORDER — ENOXAPARIN SODIUM 40 MG/0.4ML IJ SOSY
40.0000 mg | PREFILLED_SYRINGE | INTRAMUSCULAR | Status: DC
  Administered 2020-09-20: 40 mg via SUBCUTANEOUS
  Filled 2020-09-18 (×2): qty 0.4

## 2020-09-18 NOTE — Progress Notes (Signed)
PT Cancellation Note  Patient Details Name: Jeremy [name] MRN: [id number] DOB: 27-Nov-1945   Cancelled Treatment:    Reason Eval/Treat Not Completed: Patient declined, no reason specified. Pt sidelying in bed, asleep, upon PT entry. Pt currently declining participation with PT due to being tired. PT educated pt extensively regarding benefits of mobility and exercise for lung health and maintenance of strength, balance, endurance, and safety at DC. However, pt continued to decline. Will follow up tomorrow with therapy intervention as appropriate.  Herminio Commons, PT, DPT 1:13 PM,09/18/20

## 2020-09-18 NOTE — TOC Progression Note (Signed)
Transition of Care [facility]) - Progression Note    Patient Details  Name: Jeremy [name] MRN: [id number] Date of Birth: [birth year]  Transition of Care [facility]) CM/SW [place], LCSW Phone Number: 09/18/2020, 9:25 AM  Clinical Narrative: Per Mr. Janeth Rase, they do not have a preferred home health agency. Patient does not have a rolling walker at the facility. Left voicemail for brother Jenny Reichmann. Will discuss HH recommendation and agency preference when he calls back.    Expected Discharge Plan: Group Home Barriers to Discharge: Continued Medical Work up  Expected Discharge Plan and Services Expected Discharge Plan: Group Home       Living arrangements for the past 2 months: Group Home                                       Social Determinants of Health (SDOH) Interventions    Readmission Risk Interventions No flowsheet data found.

## 2020-09-18 NOTE — Progress Notes (Signed)
PROGRESS NOTE  Jeremy [name]  DOB: Sep 20, 1945  PCP: Idelle Crouch, MD [id number]  DOA: 09/15/2020  LOS: 3 days  Hospital Day: 4   Chief Complaint  Patient presents with   Altered Mental Status    C/o AMS with fever today. Pt. From SNF. PT. Received 500LR via EMS en route.    Brief narrative: Jeremy [name] is a 75 y.o. male with PMH significant for HTN, dementia, seizure disorder.   Patient was brought from a group home to the ED for fever and confusion worse than baseline. In the ED, patient had a temperature of 100.7, heart rate 113, blood pressure elevated to 162/81, continue oxygen saturation at 90% on room air. Labs with WBC count at 5.4, creatinine at 2.29 which is up from 1.3 from 2019 COVID PCR positive EKG sinus tachycardia at 110 bpm Chest x-ray did not show any acute cardiopulmonary process CT head did not show any acute intracranial event but showed tiny remote lacunar infarcts within the basal ganglia on the left.   Admitted to hospitalist service  Subjective: Patient was seen and examined this morning.  Elderly African-American male, not in distress.   Alert, awake, oriented to place and person.  Not to time.   Per RN, he was able to stand up earlier with staff.   T-max 100.2 in last 24 hours.   Labs this morning with creatinine down to 2.11 and hemoglobin down to 7.4.    Assessment/Plan: Acute metabolic encephalopathy Dementia -Presented with delirium on dementia likely due to COVID-19 infection, dehydration -Mental status gradually improved.  Currently alert, awake, oriented to place and person.  Not restless or agitated.  COVID-19 infection -PCR positive in the ED.  Chest x-ray without pneumonia. -Currently on a 5-day course of IV remdesivir.   -Not hypoxic, not on steroids -Airborne precautions. Recent Labs  Lab 09/15/20 2017 09/16/20 0034 09/16/20 0349 09/17/20 0343 09/18/20 0620  SARSCOV2NAA POSITIVE*  --   --   --   --    WBC 5.4  --  4.7 3.0* 2.8*  LATICACIDVEN 1.5 1.1  --   --   --   PROCALCITON <0.10  --  <0.10 <0.10  --   DDIMER  --   --   --  0.47 0.38  CRP  --   --   --  9.1* 7.1*  ALT 12  --   --  15 15    SIRS -POA -Met SIRS criteria on admission with fever, tachycardia numbers related to COVID infection.   -No evidence of bacterial infection at this time.  AKI on CKD 2 -Baseline creatinine 1.31 from 2020.  Presented with creatinine elevated to 2.3.  Gradually improving with IV fluid.  Creatinine is 1.97 today.  Continue LR at 75 mill per hour. Recent Labs    09/15/20 2017 09/16/20 0349 09/17/20 0343 09/18/20 0620  BUN 29*  --  26* 22  CREATININE 2.29* 2.13* 2.11* 1.97*    Essential hypertension -Home meds include losartan 100 mg daily, HCTZ 25 mg daily, unclear if the patient is also taking Benicar 40 mg daily. -Currently meds on hold because of AKI and hypotension.  Continue to hold.  Anemia -Baseline hemoglobin more than 10 from 2020.  Presented with a hemoglobin low at 8.2.  No evidence of active bleeding but hemoglobin is currently running low between 7-8.  Probably dilutional.  Continue to monitor. Recent Labs    09/15/20 2017 09/16/20 3810 09/17/20 0343 09/18/20 1751  HGB 8.2* 8.0* 7.4* 7.9*  MCV 88.4 87.6 89.1 87.9   History of seizure disorder -Continue Keppra and Lamictal.  BPH -Flomax 0.4 mg daily  Mobility: PT eval pending Code Status:   Code Status: Full Code  Nutritional status: Body mass index is 23.11 kg/m.     Diet:  Diet Order             Diet Heart Room service appropriate? Yes; Fluid consistency: Thin  Diet effective now                  DVT prophylaxis:  enoxaparin (LOVENOX) injection 40 mg Start: 09/19/20 0800 SCDs Start: 09/16/20 0119   Antimicrobials: IV remdesivir Fluid: LR_0  mill per hour Consultants: None Family Communication: None at bedside.  Status is: Inpatient  Remains inpatient appropriate because: IV remdesivir, IV  hydration  Dispo: The patient is from: group home              Anticipated d/c is to: SNF versus back to group home.              Patient currently is not medically stable to d/c.   Difficult to place patient No     Infusions:   remdesivir 100 mg in NS 100 mL 100 mg (09/18/20 0830)    Scheduled Meds:  vitamin C  500 mg Oral Daily   [START ON 09/19/2020] enoxaparin (LOVENOX) injection  40 mg Subcutaneous Q24H   lamoTRIgine  50 mg Oral BID   levETIRAcetam  500 mg Oral BID   risperiDONE  1 mg Oral TID   rosuvastatin  10 mg Oral QHS   tamsulosin  0.4 mg Oral QPC supper   zinc sulfate  220 mg Oral Daily    Antimicrobials: Anti-infectives (From admission, onward)    Start     Dose/Rate Route Frequency Ordered Stop   09/16/20 1000  remdesivir 100 mg in sodium chloride 0.9 % 100 mL IVPB       See Hyperspace for full Linked Orders Report.   100 mg 200 mL/hr over 30 Minutes Intravenous Daily 09/15/20 2202 09/20/20 0959   09/15/20 2300  remdesivir 200 mg in sodium chloride 0.9% 250 mL IVPB       See Hyperspace for full Linked Orders Report.   200 mg 580 mL/hr over 30 Minutes Intravenous Once 09/15/20 2202 09/15/20 2337   09/15/20 2145  cefTRIAXone (ROCEPHIN) 1 g in sodium chloride 0.9 % 100 mL IVPB  Status:  Discontinued        1 g 200 mL/hr over 30 Minutes Intravenous  Once 09/15/20 2142 09/15/20 2155       PRN meds: acetaminophen, guaiFENesin, ondansetron **OR** ondansetron (ZOFRAN) IV, senna-docusate   Objective: Vitals:   09/18/20 0507 09/18/20 0818  BP: 94/62 (!) 97/59  Pulse: 69 72  Resp: 17 18  Temp: 98.3 F (36.8 C) 98.2 F (36.8 C)  SpO2: 98% 96%    Intake/Output Summary (Last 24 hours) at 09/18/2020 1421 Last data filed at 09/17/2020 2208 Gross per 24 hour  Intake 0 ml  Output 100 ml  Net -100 ml    Filed Weights   09/15/20 2010  Weight: 68.9 kg   Weight change:  Body mass index is 23.11 kg/m.   Physical Exam: General exam: Pleasant, elderly  African-American male.  Not in physical distress Skin: No rashes, lesions or ulcers. HEENT: Atraumatic, normocephalic, no obvious bleeding Lungs: Clear to auscultation bilaterally CVS: Regular rate and rhythm, no murmur GI/Abd soft,  nontender, nondistended, bowel sound present CNS: Alert, awake, slow to respond, oriented to place and person.  Not to time. Psychiatry: Depressed look Extremities: No pedal edema, no calf tenderness  Data Review: I have personally reviewed the laboratory data and studies available.  Recent Labs  Lab 09/15/20 2017 09/16/20 0349 09/17/20 0343 09/18/20 0620  WBC 5.4 4.7 3.0* 2.8*  NEUTROABS 4.1  --  1.8 1.6*  HGB 8.2* 8.0* 7.4* 7.9*  HCT 24.3* 24.0* 22.0* 23.3*  MCV 88.4 87.6 89.1 87.9  PLT 189 170 158 176    Recent Labs  Lab 09/15/20 2017 09/16/20 0349 09/17/20 0343 09/18/20 0620  NA 140  --  139 141  K 3.7  --  3.7 3.5  CL 108  --  107 107  CO2 23  --  24 26  GLUCOSE 116*  --  93 92  BUN 29*  --  26* 22  CREATININE 2.29* 2.13* 2.11* 1.97*  CALCIUM 9.7  --  8.4* 8.5*  MG 1.9  --   --   --      F/u labs ordered. Unresulted Labs (From admission, onward)     Start     Ordered   09/23/20 0500  Creatinine, serum  (enoxaparin (LOVENOX)    CrCl < 30 ml/min)  Weekly,   TIMED     Comments: while on enoxaparin therapy.    09/16/20 0120   09/17/20 0500  CBC with Differential/Platelet  Daily,   R      09/16/20 0120   09/17/20 0500  Comprehensive metabolic panel  Daily,   R      09/16/20 0120   09/17/20 0500  C-reactive protein  Daily,   R      09/16/20 0120   09/17/20 0500  D-dimer, quantitative  Daily,   R      09/16/20 0120   09/15/20 2045  Lamotrigine level  Once,   STAT        09/15/20 2045            Signed, Terrilee Croak, MD [facility] 09/18/2020

## 2020-09-18 NOTE — Progress Notes (Addendum)
PHARMACIST - PHYSICIAN COMMUNICATION  CONCERNING:  Enoxaparin (Lovenox) for DVT Prophylaxis    RECOMMENDATION: Increase enoxaparin to 40 mg every 24 hours Patient was prescribed enoxpain 30 mg daily due to CrCl < 30. Patients renal function has been improving. Today, CrCl > 30.  Filed Weights   09/15/20 2010  Weight: 68.9 kg (152 lb)    Body mass index is 23.11 kg/m.  Estimated Creatinine Clearance: 31.8 mL/min (A) (by C-G formula based on SCr of 1.97 mg/dL (H)).    Patient is candidate for enoxaparin 40 mg every 24 hours based on CrCl > 15m/min    DESCRIPTION: [facility]  Patient is now receiving enoxaparin 40 mg every 24 hours    CWynelle Cleveland PharmD Clinical Pharmacist  09/18/2020 11:39 AM

## 2020-09-19 ENCOUNTER — Other Ambulatory Visit: Payer: Self-pay | Admitting: Urology

## 2020-09-19 DIAGNOSIS — N179 Acute kidney failure, unspecified: Secondary | ICD-10-CM | POA: Diagnosis not present

## 2020-09-19 DIAGNOSIS — G309 Alzheimer's disease, unspecified: Secondary | ICD-10-CM | POA: Diagnosis not present

## 2020-09-19 DIAGNOSIS — F05 Delirium due to known physiological condition: Secondary | ICD-10-CM

## 2020-09-19 DIAGNOSIS — U071 COVID-19: Secondary | ICD-10-CM | POA: Diagnosis not present

## 2020-09-19 DIAGNOSIS — F0280 Dementia in other diseases classified elsewhere without behavioral disturbance: Secondary | ICD-10-CM

## 2020-09-19 LAB — CBC WITH DIFFERENTIAL/PLATELET
Abs Immature Granulocytes: 0.01 10*3/uL (ref 0.00–0.07)
Basophils Absolute: 0 10*3/uL (ref 0.0–0.1)
Basophils Relative: 0 %
Eosinophils Absolute: 0.1 10*3/uL (ref 0.0–0.5)
Eosinophils Relative: 5 %
HCT: 23.4 % — ABNORMAL LOW (ref 39.0–52.0)
Hemoglobin: 7.9 g/dL — ABNORMAL LOW (ref 13.0–17.0)
Immature Granulocytes: 0 %
Lymphocytes Relative: 27 %
Lymphs Abs: 0.8 10*3/uL (ref 0.7–4.0)
MCH: 29.7 pg (ref 26.0–34.0)
MCHC: 33.8 g/dL (ref 30.0–36.0)
MCV: 88 fL (ref 80.0–100.0)
Monocytes Absolute: 0.3 10*3/uL (ref 0.1–1.0)
Monocytes Relative: 10 %
Neutro Abs: 1.7 10*3/uL (ref 1.7–7.7)
Neutrophils Relative %: 58 %
Platelets: 184 10*3/uL (ref 150–400)
RBC: 2.66 MIL/uL — ABNORMAL LOW (ref 4.22–5.81)
RDW: 14.2 % (ref 11.5–15.5)
WBC: 3 10*3/uL — ABNORMAL LOW (ref 4.0–10.5)
nRBC: 0 % (ref 0.0–0.2)

## 2020-09-19 LAB — COMPREHENSIVE METABOLIC PANEL
ALT: 18 U/L (ref 0–44)
AST: 31 U/L (ref 15–41)
Albumin: 3.3 g/dL — ABNORMAL LOW (ref 3.5–5.0)
Alkaline Phosphatase: 41 U/L (ref 38–126)
Anion gap: 8 (ref 5–15)
BUN: 27 mg/dL — ABNORMAL HIGH (ref 8–23)
CO2: 23 mmol/L (ref 22–32)
Calcium: 8.6 mg/dL — ABNORMAL LOW (ref 8.9–10.3)
Chloride: 111 mmol/L (ref 98–111)
Creatinine, Ser: 2.03 mg/dL — ABNORMAL HIGH (ref 0.61–1.24)
GFR, Estimated: 34 mL/min — ABNORMAL LOW (ref >60)
Glucose, Bld: 101 mg/dL — ABNORMAL HIGH (ref 70–99)
Potassium: 3.7 mmol/L (ref 3.5–5.1)
Sodium: 142 mmol/L (ref 135–145)
Total Bilirubin: 0.4 mg/dL (ref 0.3–1.2)
Total Protein: 6.5 g/dL (ref 6.5–8.1)

## 2020-09-19 LAB — C-REACTIVE PROTEIN: CRP: 5.8 mg/dL — ABNORMAL HIGH (ref <1.0)

## 2020-09-19 LAB — D-DIMER, QUANTITATIVE: D-Dimer, Quant: 0.27 ug/mL-FEU (ref 0.00–0.50)

## 2020-09-19 MED ORDER — LACTATED RINGERS IV SOLN: INTRAVENOUS | Status: DC

## 2020-09-19 NOTE — Progress Notes (Signed)
Physical Therapy Treatment Patient Details Name: Jeremy [name] MRN: [id number] DOB: [birth year] Today's Date: 09/19/2020    History of Present Illness Pt admitted to [facility] on [facility]/15/[facility] from nursing facility with concerns for acute confusion in setting of a fever secondary to (+) COVID test. Significant PMH includes: hx of prostate CA s/p radiation therapy, HTN, HDL, seizure disorder on Keppra and Lamictal.    PT Comments    Pt tolerated treatment well today and was able to improve overall activity tolerance and ambulation distance. RW used today with pt demonstrating improved balance, however, due to baseline cognitive deficit and poor safety awareness he required increased cueing for safety. Increased assist required for power to stand from lower seat height compared to IE. Increased time/effort required during session for toileting, brief change, and pericare after voiding and BM (which was loose in consistency); RN notified. Pt able to perform static standing balance for ~43mn x2 with grossly CGA for safety with donning brief and pericare. Despite progress, pt continues to be limited with meeting goals secondary to dementia, generalized weakness, decreased activity tolerance, and decreased gross balance. Pt left seated in recliner with LE elevated at end of session. Pt will continue to benefit from skilled acute PT services to address deficits for return to baseline function. Will continue to recommend return home with HHPT and 24/7 care.    Follow Up Recommendations  Supervision/Assistance - 24 hour;[facility]     Equipment Recommendations  Rolling walker with 5" wheels       Precautions / Restrictions Precautions Precautions: Fall Restrictions Weight Bearing Restrictions: No    Mobility  Bed Mobility Overal bed mobility: Modified Independent             General bed mobility comments: Mod I to sit EOB with HOB flat and use of BUE  for support    Transfers Overall transfer level: Needs assistance Equipment used: Rolling walker (2 wheeled) Transfers: Sit to/from Stand Sit to Stand: Min assist         General transfer comment: Min assist for power to stand from EOB and BSC with RW. Verbal cues for hand placement and sequencing to ensure safety with mobility; wide BOS noted with initial standing.  Ambulation/Gait Ambulation/Gait assistance: Min guard Gait Distance (Feet): 65 Feet (555fx1, 1586f1) Assistive device: Rolling walker (2 wheeled)       General Gait Details: CGA for safety to ambulate in room with RW. Pt demonstrates slowed cadence, early reciprocal gait, decreased step length/foot clearance bil, and poor RW /proximity/negotiation. Verbal cues for RW proximity/negotiation and sequencing.     Balance Overall balance assessment: Needs assistance Sitting-balance support: Feet supported;No upper extremity supported Sitting balance-Leahy Scale: Fair Sitting balance - Comments: No LOB with anterior scooting   Standing balance support: During functional activity;No upper extremity supported Standing balance-Leahy Scale: Fair Standing balance comment: Grossly fair standing balance with BUE support on RW       Cognition Arousal/Alertness: Awake/alert Behavior During Therapy: WFL for tasks assessed/performed Overall Cognitive Status: History of cognitive impairments - at baseline            General Comments: Able to follow 100% of simple 2-step commands, with increased time for processing      Exercises Other Exercises Other Exercises: Pt able to participate in bed mobility, transfers, gait, and toileting with RW.  Required grossly min assist-CGA for safety for transfers/gait. Able to perform static standing for ~2mi25m2 for  pericare and brief change after toileting with and without UE support; no LOB but demonstrates wide BOS. Other Exercises: Pt educated regarding: PT role/POC, DC  recommendations, benefits of participation/OOB mobility, and safety with functional mobility.        Pertinent Vitals/Pain Pain Assessment: No/denies pain     PT Goals (current goals can now be found in the care plan section) Acute Rehab PT Goals PT Goal Formulation: Patient unable to participate in goal setting    Frequency    Min 2X/week      PT Plan Current plan remains appropriate       AM-PAC PT "6 Clicks" Mobility   Outcome Measure  Help needed turning from your back to your side while in a flat bed without using bedrails?: None Help needed moving from lying on your back to sitting on the side of a flat bed without using bedrails?: None Help needed moving to and from a bed to a chair (including a wheelchair)?: A Little Help needed standing up from a chair using your arms (e.g., wheelchair or bedside chair)?: A Little Help needed to walk in hospital room?: A Little Help needed climbing 3-5 steps with a railing? : A Lot 6 Click Score: 19    End of Session Equipment Utilized During Treatment: Gait belt Activity Tolerance: Patient tolerated treatment well;Patient limited by fatigue Patient left: in bed;with call bell/phone within reach;with bed alarm set Nurse Communication: Mobility status PT Visit Diagnosis: Unsteadiness on feet (R26.81);Muscle weakness (generalized) (M62.81)     Time: [id number] PT Time Calculation (min) (ACUTE ONLY): 39 min  Charges:  $Therapeutic Activity: 38-52 mins                     Herminio Commons, PT, DPT 12:19 PM,09/19/20

## 2020-09-19 NOTE — Progress Notes (Signed)
PROGRESS NOTE  Jeremy [name]  DOB: 12-18-1945  PCP: Idelle Crouch, MD [id number]  DOA: 09/15/2020  LOS: 4 days  Hospital Day: 5   Chief Complaint  Patient presents with   Altered Mental Status    C/o AMS with fever today. Pt. From SNF. PT. Received 500LR via EMS en route.    Brief narrative: Jeremy [name] is a 75 y.o. male with PMH significant for HTN, dementia, seizure disorder-who was brought from his group home for evaluation and fever and confusion-patient was found to have COVID-19 infection and related acute metabolic encephalopathy.   Subjective: Lying comfortably in bed-no major issues overnight.  Relatively awake and alert.  Assessment/Plan: Acute metabolic encephalopathy superimposed on dementia: Etiology of encephalopathy likely due to COVID-19 infection/dehydration-encephalopathy has improved-suspect patient is back to his baseline.  COVID-19 infection: Not hypoxic-imaging without pneumonia-on Remdesivir.  SIRS -POA: Due to COVID-19 infection-with supportive care SIRS physiology has resolved.  AKI on CKD 2: AKI probably hemodynamically mediated in the setting of ARB/HCTZ/COVID-19 infection.  Continues to have mild AKI-could have developed progressive CKD since last chemistry panel checked in 2020.  UA without proteinuria-check renal ultrasound-continue cautious hydration-avoid nephrotoxic agents.  Essential hypertension: BP stable without use of any antihypertensives-losartan/HCTZ on hold.  Normocytic Anemia: Due to acute illness-CKD-no evidence of bleeding-monitor periodically.  History of seizure disorder:Continue Keppra and Lamictal.  [id number] 0.4 mg daily  Mobility: PT eval pending Code Status:   Code Status: Full Code  Nutritional status: Body mass index is 23.11 kg/m.     Diet:  Diet Order             Diet Heart Room service appropriate? Yes; Fluid consistency: Thin  Diet effective now                  DVT  prophylaxis:  enoxaparin (LOVENOX) injection 40 mg Start: 09/19/20 0800 SCDs Start: 09/16/20 0119   Antimicrobials: IV remdesivir Fluid: LR'@75'$  mill per hour Consultants: None Family Communication: None at bedside.  Status is: Inpatient  Remains inpatient appropriate because: IV remdesivir, IV hydration  Dispo: The patient is from: group home              Anticipated d/c is to: SNF versus back to group home.              Patient currently is not medically stable to d/c.   Difficult to place patient No   Infusions:     Scheduled Meds:  vitamin C  500 mg Oral Daily   enoxaparin (LOVENOX) injection  40 mg Subcutaneous Q24H   lamoTRIgine  50 mg Oral BID   levETIRAcetam  500 mg Oral BID   risperiDONE  1 mg Oral TID   rosuvastatin  10 mg Oral QHS   tamsulosin  0.4 mg Oral QPC supper   zinc sulfate  220 mg Oral Daily    Antimicrobials: Anti-infectives (From admission, onward)    Start     Dose/Rate Route Frequency Ordered Stop   09/16/20 1000  remdesivir 100 mg in sodium chloride 0.9 % 100 mL IVPB       See Hyperspace for full Linked Orders Report.   100 mg 200 mL/hr over 30 Minutes Intravenous Daily 09/15/20 2202 09/19/20 1040   09/15/20 2300  remdesivir 200 mg in sodium chloride 0.9% 250 mL IVPB       See Hyperspace for full Linked Orders Report.   200 mg 580 mL/hr over 30 Minutes Intravenous Once 09/15/20 2202  09/15/20 2337   09/15/20 2145  cefTRIAXone (ROCEPHIN) 1 g in sodium chloride 0.9 % 100 mL IVPB  Status:  Discontinued        1 g 200 mL/hr over 30 Minutes Intravenous  Once 09/15/20 2142 09/15/20 2155       PRN meds: acetaminophen, guaiFENesin, ondansetron **OR** ondansetron (ZOFRAN) IV, senna-docusate   Objective: Vitals:   09/19/20 0440 09/19/20 0856  BP: (!) 90/46 106/68  Pulse: 69 68  Resp: 16   Temp: 98.2 F (36.8 C) 97.8 F (36.6 C)  SpO2: 99% 99%   No intake or output data in the 24 hours ending 09/19/20 1505  Filed Weights   09/15/20 2010   Weight: 68.9 kg   Weight change:  Body mass index is 23.11 kg/m.   Physical Exam: Gen Exam:Alert awake-not in any distress HEENT:atraumatic, normocephalic Chest: B/L clear to auscultation anteriorly CVS:S1S2 regular Abdomen:soft non tender, non distended Extremities:no edema Neurology: Non focal Skin: no rash   Data Review: I have personally reviewed the laboratory data and studies available.  Recent Labs  Lab 09/15/20 2017 09/16/20 0349 09/17/20 0343 09/18/20 0620 09/19/20 0444  WBC 5.4 4.7 3.0* 2.8* 3.0*  NEUTROABS 4.1  --  1.8 1.6* 1.7  HGB 8.2* 8.0* 7.4* 7.9* 7.9*  HCT 24.3* 24.0* 22.0* 23.3* 23.4*  MCV 88.4 87.6 89.1 87.9 88.0  PLT 189 170 158 176 184    Recent Labs  Lab 09/15/20 2017 09/16/20 0349 09/17/20 0343 09/18/20 0620 09/19/20 0444  NA 140  --  139 141 142  K 3.7  --  3.7 3.5 3.7  CL 108  --  107 107 111  CO2 23  --  '24 26 23  '$ GLUCOSE 116*  --  93 92 101*  BUN 29*  --  26* 22 27*  CREATININE 2.29* 2.13* 2.11* 1.97* 2.03*  CALCIUM 9.7  --  8.4* 8.5* 8.6*  MG 1.9  --   --   --   --      F/u labs ordered. Unresulted Labs (From admission, onward)     Start     Ordered   09/23/20 0500  Creatinine, serum  (enoxaparin (LOVENOX)    CrCl < 30 ml/min)  Weekly,   TIMED     Comments: while on enoxaparin therapy.    09/16/20 0120   09/17/20 0500  CBC with Differential/Platelet  Daily,   R      09/16/20 0120   09/17/20 0500  Comprehensive metabolic panel  Daily,   R      09/16/20 0120   09/17/20 0500  C-reactive protein  Daily,   R      09/16/20 0120   09/17/20 0500  D-dimer, quantitative  Daily,   R      09/16/20 0120            Signed, Oren Binet, MD [facility] 09/19/2020

## 2020-09-19 NOTE — Progress Notes (Signed)
Patient non-compliant with Telemetry monitoring.  Keeps taking leads off. Informed MD who stated if patient is compliant, leave monitoring on and if he is not discontinue. Letf monitoring on for about an hour after receiving MD's chat and patient went on and took the leads off, again. Therefore, have discontinued telemetry monitoring.

## 2020-09-19 NOTE — Care Management Important Message (Signed)
Important Message  Patient Details  Name: Jeremy [name] MRN: [id number] Date of Birth: 1945-03-21   Medicare Important Message Given:  N/A - LOS <3 / Initial given by admissions  Initial Medicare IM reviewed with patient via phone by Thornton Dales, Patient Access Associate on 09/18/2020 at 10:51am.     Dannette Barbara 09/19/2020, 8:05 AM

## 2020-09-19 NOTE — TOC Progression Note (Addendum)
Transition of Care [facility]) - Progression Note    Patient Details  Name: ADRIEL KAUN MRN: [id number] Date of Birth: [birth year]  Transition of Care [facility]) CM/SW [place], LCSW Phone Number: 09/19/2020, 11:31 AM  Clinical Narrative:  Received call back from patient's sister-in-law, Doris Vaeth. She said her husband (patient's brother), Jenny Reichmann, passed away in 20-Jan-2023. Will add her contact information into emergency contacts. His brother Dorothyann Peng lives in [place]. She is agreeable to home health and walker recommendations. Discussed agencies that accept [facility] and gave star ratings. She will discuss with patient's brother and call back with preference. Notified her of plan to discharge back to [facility] tomorrow.    3:07 pm: Called and spoke to sister-in-law. She prefers [facility]. Made referral to [facility] for PT and it was accepted. Asked MD for home health and DME orders.   3:29 pm: Ordered walker through [employer].  Expected Discharge Plan: Group Home Barriers to Discharge: Continued Medical Work up  Expected Discharge Plan and Services Expected Discharge Plan: Group Home       Living arrangements for the past 2 months: Group Home                                       Social Determinants of Health (SDOH) Interventions    Readmission Risk Interventions No flowsheet data found.

## 2020-09-20 ENCOUNTER — Inpatient Hospital Stay: Payer: Medicare HMO

## 2020-09-20 DIAGNOSIS — R32 Unspecified urinary incontinence: Secondary | ICD-10-CM | POA: Diagnosis not present

## 2020-09-20 DIAGNOSIS — G309 Alzheimer's disease, unspecified: Secondary | ICD-10-CM | POA: Diagnosis not present

## 2020-09-20 DIAGNOSIS — U071 COVID-19: Secondary | ICD-10-CM | POA: Diagnosis not present

## 2020-09-20 DIAGNOSIS — R569 Unspecified convulsions: Secondary | ICD-10-CM | POA: Diagnosis not present

## 2020-09-20 DIAGNOSIS — N179 Acute kidney failure, unspecified: Secondary | ICD-10-CM | POA: Diagnosis not present

## 2020-09-20 LAB — CBC WITH DIFFERENTIAL/PLATELET
Abs Immature Granulocytes: 0.01 10*3/uL (ref 0.00–0.07)
Basophils Absolute: 0 10*3/uL (ref 0.0–0.1)
Basophils Relative: 1 %
Eosinophils Absolute: 0.2 10*3/uL (ref 0.0–0.5)
Eosinophils Relative: 6 %
HCT: 23 % — ABNORMAL LOW (ref 39.0–52.0)
Hemoglobin: 7.9 g/dL — ABNORMAL LOW (ref 13.0–17.0)
Immature Granulocytes: 0 %
Lymphocytes Relative: 25 %
Lymphs Abs: 0.8 10*3/uL (ref 0.7–4.0)
MCH: 30.2 pg (ref 26.0–34.0)
MCHC: 34.3 g/dL (ref 30.0–36.0)
MCV: 87.8 fL (ref 80.0–100.0)
Monocytes Absolute: 0.3 10*3/uL (ref 0.1–1.0)
Monocytes Relative: 11 %
Neutro Abs: 1.8 10*3/uL (ref 1.7–7.7)
Neutrophils Relative %: 57 %
Platelets: 188 10*3/uL (ref 150–400)
RBC: 2.62 MIL/uL — ABNORMAL LOW (ref 4.22–5.81)
RDW: 14.2 % (ref 11.5–15.5)
WBC: 3.1 10*3/uL — ABNORMAL LOW (ref 4.0–10.5)
nRBC: 0 % (ref 0.0–0.2)

## 2020-09-20 LAB — COMPREHENSIVE METABOLIC PANEL
ALT: 14 U/L (ref 0–44)
AST: 26 U/L (ref 15–41)
Albumin: 3.1 g/dL — ABNORMAL LOW (ref 3.5–5.0)
Alkaline Phosphatase: 38 U/L (ref 38–126)
Anion gap: 7 (ref 5–15)
BUN: 25 mg/dL — ABNORMAL HIGH (ref 8–23)
CO2: 24 mmol/L (ref 22–32)
Calcium: 8.4 mg/dL — ABNORMAL LOW (ref 8.9–10.3)
Chloride: 113 mmol/L — ABNORMAL HIGH (ref 98–111)
Creatinine, Ser: 1.74 mg/dL — ABNORMAL HIGH (ref 0.61–1.24)
GFR, Estimated: 41 mL/min — ABNORMAL LOW (ref >60)
Glucose, Bld: 92 mg/dL (ref 70–99)
Potassium: 3.8 mmol/L (ref 3.5–5.1)
Sodium: 144 mmol/L (ref 135–145)
Total Bilirubin: 0.4 mg/dL (ref 0.3–1.2)
Total Protein: 6 g/dL — ABNORMAL LOW (ref 6.5–8.1)

## 2020-09-20 LAB — CULTURE, BLOOD (ROUTINE X 2)
Culture: NO GROWTH
Culture: NO GROWTH
Special Requests: ADEQUATE
Special Requests: ADEQUATE

## 2020-09-20 LAB — C-REACTIVE PROTEIN: CRP: 3.4 mg/dL — ABNORMAL HIGH (ref <1.0)

## 2020-09-20 NOTE — TOC Progression Note (Signed)
Transition of Care [facility]) - Progression Note    Patient Details  Name: Jeremy [name] MRN: [id number] Date of Birth: 05/14/1945  Transition of Care [facility]) CM/SW [place], LCSW Phone Number: 09/20/2020, 12:20 PM  Clinical Narrative: Notified group home owner of plan to discharge today. Will call him again once orders and summary are in to coordinate timing for transport.    Expected Discharge Plan: Group Home Barriers to Discharge: Continued Medical Work up  Expected Discharge Plan and Services Expected Discharge Plan: Group Home       Living arrangements for the past 2 months: Group Home                                       Social Determinants of Health (SDOH) Interventions    Readmission Risk Interventions No flowsheet data found.

## 2020-09-20 NOTE — Progress Notes (Signed)
Mobility Specialist - Progress Note   09/20/20 1238  Mobility  Activity Ambulated in room;Transferred:  Bed to chair  Range of Motion/Exercises Right leg;Left leg (AP, SLR, ISO)  Level of Assistance Standby assist, set-up cues, supervision of patient - no hands on  Assistive Device Front wheel walker  Distance Ambulated (ft) 12 ft  Mobility Ambulated with assistance in room;Out of bed to chair with meals  Mobility Response Tolerated well  Mobility performed by Mobility specialist  $Mobility charge 1 Mobility    Post-mobility: 73 HR, 100% SpO2   Pt lying in bed upon arrival, utilizing RA. Pt sat EOB independently, voiced need to urinate. Modified Independent doffing/donning socks with extra time needed. Stood to [employer] with supervision and extra time. No LOB ambulating to recliner where pt was left with needs in reach and alarm set. Denied SOB. Pt also participated in seated therex: ankle pumps, straight leg raises, and hip isometrics.    Kathee Delton Mobility Specialist 09/20/20, 12:45 PM

## 2020-09-20 NOTE — NC FL2 (Signed)
[place] LEVEL OF CARE SCREENING TOOL     IDENTIFICATION  Patient Name: Jeremy [name] Birthdate: 1945/07/06 Sex: male Admission Date (Current Location): 09/15/2020  [facility] and [place] Number:  Engineering geologist and Address:  [facility], [address], [place], [place] [address]      Provider Number: [id number]  Attending Physician Name and Address:  Jonetta Osgood, MD  Relative Name and Phone Number:       Current Level of Care: Hospital Recommended Level of Care: [facility] (with PT through [facility]) Prior Approval Number:    Date Approved/Denied:   PASRR Number:    Discharge Plan: Other (Comment) 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 Dementia.) Continent Diet (Low sodium heart healthy)  AMBULATORY STATUS COMMUNICATION OF NEEDS Skin   Limited Assist Verbally Other (Comment) (Cracking)                       Personal Care Assistance Level of Assistance              Functional Limitations Info  Sight, Hearing, Speech Sight Info: Adequate Hearing Info: Adequate Speech Info: Adequate    SPECIAL CARE  FACTORS FREQUENCY  PT (By licensed PT)     PT Frequency: 3 x week              Contractures Contractures Info: Not present    Additional Factors Info  Code Status, Allergies Code Status Info: Full code Allergies Info: NKDA           Current Medications (09/20/2020):  This is the current hospital active medication list Current Facility-Administered Medications  Medication Dose Route Frequency Provider Last Rate Last Admin   acetaminophen (TYLENOL) tablet 650 mg  650 mg Oral Q6H PRN Athena Masse, MD   650 mg at 09/16/20 2054   ascorbic acid (VITAMIN C) tablet 500 mg  500 mg Oral Daily Judd Gaudier V, MD   500 mg at 09/20/20 0903   enoxaparin (LOVENOX) injection 40 mg  40 mg Subcutaneous Q24H Wynelle Cleveland, RPH   40 mg at 09/20/20 [id number]   guaiFENesin (ROBITUSSIN) 100 MG/5ML solution 200 mg  200 mg Oral Q6H PRN Terrilee Croak, MD       lactated ringers infusion   Intravenous Continuous Jonetta Osgood, MD 75 mL/hr at 09/19/20 1641 New Bag at 09/19/20 1641   lamoTRIgine (LAMICTAL) tablet 50 mg  50 mg Oral BID Terrilee Croak, MD   50 mg at 09/20/20 0903   levETIRAcetam (KEPPRA) tablet 500 mg  500 mg Oral BID Athena Masse, MD   500 mg at 09/20/20 0903   ondansetron (ZOFRAN) tablet 4  mg  4 mg Oral Q6H PRN Athena Masse, MD       Or   ondansetron [facility]) injection 4 mg  4 mg Intravenous Q6H PRN Athena Masse, MD       risperiDONE (RISPERDAL) tablet 1 mg  1 mg Oral TID Terrilee Croak, MD   1 mg at 09/20/20 [id number]   rosuvastatin (CRESTOR) tablet 10 mg  10 mg Oral QHS Terrilee Croak, MD   10 mg at 09/19/20 2109   senna-docusate (Senokot-S) tablet 1 tablet  1 tablet Oral QHS PRN Athena Masse, MD       tamsulosin [facility]) capsule 0.4 mg  0.4 mg Oral QPC supper Dahal, Marlowe Aschoff, MD   0.4 mg at 09/19/20 2109   zinc sulfate capsule 220 mg  220 mg Oral Daily Athena Masse, MD   220 mg at 09/20/20 [id number]     Discharge Medications: STOP taking these medications     bismuth  subsalicylate [id number] suspension Commonly known as: PEPTO BISMOL    losartan-hydrochlorothiazide 100-25 MG tablet Commonly known as: HYZAAR    olmesartan 40 MG tablet Commonly known as: BENICAR           TAKE these medications     acetaminophen 325 MG tablet Commonly known as: TYLENOL Take 650 mg by mouth every 4 (four) hours as needed for moderate pain, fever or headache.    alum & mag hydroxide-simeth 200-200-20 MG/5ML suspension Commonly known as: MAALOX/MYLANTA Take 30 mLs by mouth daily as needed for indigestion or heartburn.    Calcium + Vitamin D3 600-400 MG-UNIT Tabs Generic drug: Calcium Carbonate-Vitamin D3 TAKE 1 TABLET BY MOUTH 2 TIMES DAILY    guaiFENesin 100 MG/5ML liquid Commonly known as: ROBITUSSIN Take 200 mg by mouth every 6 (six) hours as needed for cough.    lamoTRIgine 25 MG tablet Commonly known as: LAMICTAL Take 50 mg by mouth 2 (two) times daily.    levETIRAcetam 500 MG tablet Commonly known as: KEPPRA Take 500 mg by mouth 2 (two) times daily.    loperamide 2 MG capsule Commonly known as: IMODIUM Take 2 mg by mouth 4 (four) times daily as needed for diarrhea or loose stools.    magnesium hydroxide 400 MG/5ML suspension Commonly known as: MILK OF MAGNESIA Take 30 mLs by mouth daily as needed for mild constipation.    risperiDONE 2 MG tablet Commonly known as: RISPERDAL Take 1 mg by mouth 3 (three) times daily.    rosuvastatin 10 MG tablet Commonly known as: CRESTOR Take 10 mg by mouth daily.    tamsulosin 0.4 MG Caps capsule Commonly known as: FLOMAX Take 0.4 mg by mouth daily after supper.    Relevant Imaging Results:  Relevant Lab Results:   Additional Information SS#: [id number]  Candie Chroman, LCSW

## 2020-09-20 NOTE — TOC Transition Note (Signed)
Transition of Care [facility]) - CM/SW Discharge Note   Patient Details  Name: ERMINIO MOROCCO MRN: [id number] Date of Birth: December 02, 1945  [facility] of [facility]) CM/SW Contact:  Candie Chroman, LCSW Phone Number: 09/20/2020, 12:42 PM   Clinical Narrative:  Patient has orders to discharge back to [facility] today. They will pick him up around 1:15. Left message for [facility] rep to notify. No further concerns. CSW signing off.   Final next level of care: Group Home (with home health) Barriers to Discharge: Barriers Resolved   Patient Goals and CMS Choice        Discharge Placement                Patient to be transferred to facility by: Group home will take him Name of family member notified: Jeanice Lim Patient and family notified of of transfer: 09/20/20  Discharge Plan and Services                DME Arranged: Gilford Rile rolling DME Agency: [employer] Date DME Agency Contacted: 09/20/20   Representative spoke with at DME Agency: Adele Barthel Arranged: PT [place]: [facility] [place] Date [place]: 09/20/20   Representative spoke with at [place]: Judson Roch  Social Determinants of Health ([place]) Interventions     Readmission Risk Interventions No flowsheet data found.

## 2020-09-20 NOTE — Progress Notes (Signed)
Attempted to call group home, packet printed off for the group home, & family aware of discharge.

## 2020-09-20 NOTE — Discharge Summary (Addendum)
PATIENT DETAILS Name: Jeremy [name] Age: 75 y.o. Sex: male Date of Birth: 1945-10-08 MRN: [id number]. Admitting Physician: Athena Masse, MD [id number], Leonie Douglas, MD  Admit Date: 09/15/2020 Discharge date: 09/20/2020  Recommendations for Outpatient Follow-up:  Follow up with PCP in 1-2 weeks Please obtain CMP/CBC in one week   Admitted From:  Group home  Disposition: Group home with home health services   Home Health: No  Equipment/Devices: None  Discharge Condition: Stable  CODE STATUS: FULL CODE  Diet recommendation:  Diet Order             Diet - low sodium heart healthy           Diet Heart Room service appropriate? Yes; Fluid consistency: Thin  Diet effective now                    Brief Summary: Jeremy [name] is a 75 y.o. male with PMH significant for HTN, dementia, seizure disorder-who was brought from his group home for evaluation and fever and confusion-patient was found to have COVID-19 infection and related acute metabolic encephalopathy.  Brief Hospital Course: Acute metabolic encephalopathy superimposed on dementia: Etiology of encephalopathy likely due to COVID-19 infection/dehydration-encephalopathy has improved-suspect patient is back to his baseline.   COVID-19 infection: Not hypoxic-imaging without pneumonia-treated with a course of Remdesivir.  Is currently asymptomatic.   SIRS -POA: Due to COVID-19 infection-with supportive care SIRS physiology has resolved.   AKI on CKD 2: AKI probably hemodynamically mediated in the setting of ARB/HCTZ/COVID-19 infection.  Renal function has slowly improved-suspect he could have developed some mild progressive CKD since his last chemistry panel checked in 2020.  Please repeat chemistry panel in 1 week.  His volume status is stable-and has adequate oral intake.  Continue to avoid nephrotoxic agents in the interim.   Essential hypertension: BP stable without use of any  antihypertensives-losartan/HCTZ on hold.   Normocytic Anemia: Due to acute illness-CKD-no evidence of bleeding-monitor periodically.  History of seizure disorder:Continue Keppra and Lamictal.   [id number] 0.4 mg daily  Procedures None  Discharge Diagnoses:  Principal Problem:   COVID-19 virus infection Active Problems:   Dementia without behavioral disturbance ([place])   Hypertension   Seizures ([place])   Acute confusion due to infection   AKI (acute kidney injury) [facility])   Discharge Instructions:  Activity:  As tolerated with Full fall precautions use walker/cane & assistance as needed  Discharge Instructions     Call MD for:  difficulty breathing, headache or visual disturbances   Complete by: As directed    Diet - low sodium heart healthy   Complete by: As directed    Increase activity slowly   Complete by: As directed       Allergies as of 09/20/2020   No Known Allergies      Medication List     STOP taking these medications    bismuth subsalicylate [id number] suspension Commonly known as: PEPTO BISMOL   losartan-hydrochlorothiazide 100-25 MG tablet Commonly known as: HYZAAR   olmesartan 40 MG tablet Commonly known as: BENICAR       TAKE these medications    acetaminophen 325 MG tablet Commonly known as: TYLENOL Take 650 mg by mouth every 4 (four) hours as needed for moderate pain, fever or headache.   alum & mag hydroxide-simeth 200-200-20 MG/5ML suspension Commonly known as: MAALOX/MYLANTA Take 30 mLs by mouth daily as needed for indigestion or heartburn.   Calcium + Vitamin D3 600-400  MG-UNIT Tabs Generic drug: Calcium Carbonate-Vitamin D3 TAKE 1 TABLET BY MOUTH 2 TIMES DAILY   guaiFENesin 100 MG/5ML liquid Commonly known as: ROBITUSSIN Take 200 mg by mouth every 6 (six) hours as needed for cough.   lamoTRIgine 25 MG tablet Commonly known as: LAMICTAL Take 50 mg by mouth 2 (two) times daily.   levETIRAcetam 500 MG tablet Commonly known  as: KEPPRA Take 500 mg by mouth 2 (two) times daily.   loperamide 2 MG capsule Commonly known as: IMODIUM Take 2 mg by mouth 4 (four) times daily as needed for diarrhea or loose stools.   magnesium hydroxide 400 MG/5ML suspension Commonly known as: MILK OF MAGNESIA Take 30 mLs by mouth daily as needed for mild constipation.   risperiDONE 2 MG tablet Commonly known as: RISPERDAL Take 1 mg by mouth 3 (three) times daily.   rosuvastatin 10 MG tablet Commonly known as: CRESTOR Take 10 mg by mouth daily.   tamsulosin 0.4 MG Caps capsule Commonly known as: FLOMAX Take 0.4 mg by mouth daily after supper.               Durable Medical Equipment  (From admission, onward)           Start     Ordered   09/19/20 1518  For home use only DME Walker rolling  Once       Question Answer Comment  Walker: With 5 Inch Wheels   Patient needs a walker to treat with the following condition Physical deconditioning      09/19/20 1517            Follow-up Information     [facility] Follow [facility].   Specialty: Home Health Services Why: They will follow up with you for your home health needs: Physical therapy Contact information: [address] [place] [address] [phone]         Idelle Crouch, MD. Schedule an appointment as soon as possible for a visit in 1 week(s).   Specialty: Internal Medicine Contact information: [place] [address] [phone]                No Known Allergies   Consultations:  None   Other Procedures/Studies: CT Head Wo Contrast  Result Date: 09/15/2020 CLINICAL DATA:  Altered mental status EXAM: CT HEAD WITHOUT CONTRAST TECHNIQUE: Contiguous axial images were obtained from the base of the skull through the vertex without intravenous contrast. COMPARISON:  07/10/2017 FINDINGS: Brain: Normal anatomic configuration. Parenchymal volume loss is commensurate with the  patient's age. Mild periventricular white matter changes are present likely reflecting the sequela of small vessel ischemia. Tiny remote lacunar infarcts are noted within the a left caudate and lentiform nucleus. No abnormal intra or extra-axial mass lesion or fluid collection. No abnormal mass effect or midline shift. No evidence of acute intracranial hemorrhage or infarct. Ventricular size is normal. Cerebellum unremarkable. Vascular: No asymmetric hyperdense vasculature at the skull base. Skull: Intact Sinuses/Orbits: Mild mucosal thickening within the right maxillary sinus with small layering fluid. Bilateral maxillary antrostomy has been performed. Minimal layering secretions within the left sphenoid sinus. Remaining paranasal sinuses are clear. Orbits are unremarkable. Other: Mastoid air cells and middle ear cavities are clear. IMPRESSION: No acute intracranial abnormality. Mild senescent change. 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 Mild paranasal sinus disease. Electronically Signed   By: Fidela Salisbury MD   On: 09/15/2020 21:22  DG Chest Port 1 View  Result Date: 09/15/2020 CLINICAL DATA:  Questionable sepsis - evaluate for abnormality Fever today. EXAM: PORTABLE CHEST 1 VIEW COMPARISON:  Chest radiograph 06/25/2017 FINDINGS: Stable cardiomediastinal contours. Borderline cardiomegaly the lungs are clear. Pulmonary vasculature is normal. No consolidation, pleural effusion, or pneumothorax. No acute osseous abnormalities are seen. IMPRESSION: Stable borderline cardiomegaly.  No acute pulmonary process. Electronically Signed   By: Keith Rake M.D.   On: 09/15/2020 20:53     TODAY-DAY OF DISCHARGE:  Subjective:   Jeremy [name] today has no headache,no chest abdominal pain,no new weakness tingling or numbness, feels much better wants to go home today.   Objective:   Blood pressure 117/68, pulse 76, temperature 98.4 F (36.9 C), temperature source Oral, resp. rate  18, height '5\' 8"'$  (1.727 m), weight 68.9 kg, SpO2 99 %.  Intake/Output Summary (Last 24 hours) at 09/20/2020 1240 Last data filed at 09/20/2020 1000 Gross per 24 hour  Intake 848.58 ml  Output 850 ml  Net -1.42 ml   Filed Weights   09/15/20 2010  Weight: 68.9 kg    Exam: Awake Alert, Oriented *3, No new F.N deficits, Normal affect [place].AT,PERRAL Supple Neck,No JVD, No cervical lymphadenopathy appriciated.  Symmetrical Chest wall movement, Good air movement bilaterally, CTAB RRR,No Gallops,Rubs or new Murmurs, No Parasternal Heave +ve B.Sounds, Abd Soft, Non tender, No organomegaly appriciated, No rebound -guarding or rigidity. No Cyanosis, Clubbing or edema, No new Rash or bruise   PERTINENT RADIOLOGIC STUDIES: No results found.   PERTINENT LAB RESULTS: CBC: Recent Labs    09/19/20 0444 09/20/20 0522  WBC 3.0* 3.1*  HGB 7.9* 7.9*  HCT 23.4* 23.0*  PLT 184 188   CMET CMP     Component Value Date/Time   NA 144 09/20/2020 0522   NA 137 08/14/2013 2304   K 3.8 09/20/2020 0522   K 3.8 08/14/2013 2304   CL 113 (H) 09/20/2020 0522   CL 104 08/14/2013 2304   CO2 24 09/20/2020 0522   CO2 24 08/14/2013 2304   GLUCOSE 92 09/20/2020 0522   GLUCOSE 110 (H) 08/14/2013 2304   BUN 25 (H) 09/20/2020 0522   BUN 10 08/14/2013 2304   CREATININE 1.74 (H) 09/20/2020 0522   CREATININE 1.40 (H) 08/14/2013 2304   CALCIUM 8.4 (L) 09/20/2020 0522   CALCIUM 8.2 (L) 08/14/2013 2304   PROT 6.0 (L) 09/20/2020 0522   ALBUMIN 3.1 (L) 09/20/2020 0522   AST 26 09/20/2020 0522   ALT 14 09/20/2020 0522   ALKPHOS 38 09/20/2020 0522   BILITOT 0.4 09/20/2020 0522   GFRNONAA 41 (L) 09/20/2020 0522   GFRNONAA 52 (L) 08/14/2013 2304   GFRAA >60 08/30/2018 1147   GFRAA 60 (L) 08/14/2013 2304    GFR Estimated Creatinine Clearance: 36 mL/min (A) (by C-G formula based on SCr of 1.74 mg/dL (H)). No results for input(s): LIPASE, AMYLASE in the last 72 hours. No results for input(s): CKTOTAL, CKMB,  CKMBINDEX, TROPONINI in the last 72 hours. Invalid input(s): POCBNP Recent Labs    09/18/20 0620 09/19/20 0444  DDIMER 0.38 0.27   No results for input(s): HGBA1C in the last 72 hours. No results for input(s): CHOL, HDL, LDLCALC, TRIG, CHOLHDL, LDLDIRECT in the last 72 hours. No results for input(s): TSH, T4TOTAL, T3FREE, THYROIDAB in the last 72 hours.  Invalid input(s): FREET3 No results for input(s): VITAMINB12, FOLATE, FERRITIN, TIBC, IRON, RETICCTPCT in the last 72 hours. Coags: No results for input(s): INR in the last 72  hours.  Invalid input(s): PT Microbiology: Recent Results (from the past 240 hour(s))  Resp Panel by RT-PCR (Flu A&B, Covid) Nasopharyngeal Swab     Status: Abnormal   Collection Time: 09/15/20  8:17 PM   Specimen: Nasopharyngeal Swab; Nasopharyngeal(NP) swabs in vial transport medium  Result Value Ref Range Status   SARS Coronavirus 2 by RT PCR POSITIVE (A) NEGATIVE Final    Comment: CALLED TO ELANA GABORIAULT @ 2140 09/15/20 NJP (NOTE) SARS-CoV-2 target nucleic acids are DETECTED.  The SARS-CoV-2 RNA is generally detectable in upper respiratory specimens during the acute phase of infection. Positive results are indicative of the presence of the identified virus, but do not rule out bacterial infection or co-infection with other pathogens not detected by the test. Clinical correlation with patient history and other diagnostic information is necessary to determine patient infection status. The expected result is Negative.  Fact Sheet for Patients: EntrepreneurPulse.com.au  Fact Sheet for Healthcare Providers: IncredibleEmployment.be  This test is not yet approved or cleared by the [place] FDA and  has been authorized for detection and/or diagnosis of SARS-CoV-2 by FDA under an Emergency Use Authorization (EUA).  This EUA will remain in effect (meaning this test can be used) for the duration of  the COVID- 19  declaration under Section 564(b)(1) of the Act, 21 U.S.C. section 360bbb-3(b)(1), unless the authorization is terminated or revoked sooner.     Influenza A by PCR NEGATIVE NEGATIVE Final   Influenza B by PCR NEGATIVE NEGATIVE Final    Comment: (NOTE) The Xpert Xpress SARS-CoV-2/FLU/RSV plus assay is intended as an aid in the diagnosis of influenza from Nasopharyngeal swab specimens and should not be used as a sole basis for treatment. Nasal washings and aspirates are unacceptable for Xpert Xpress SARS-CoV-2/FLU/RSV testing.  Fact Sheet for Patients: EntrepreneurPulse.com.au  Fact Sheet for Healthcare Providers: IncredibleEmployment.be  This test is not yet approved or cleared by the [place] FDA and has been authorized for detection and/or diagnosis of SARS-CoV-2 by FDA under an Emergency Use Authorization (EUA). This EUA will remain in effect (meaning this test can be used) for the duration of the COVID-19 declaration under Section 564(b)(1) of the Act, 21 U.S.C. section 360bbb-3(b)(1), unless the authorization is terminated or revoked.  Performed at [facility], [address] [place], [place] [address]   Blood Culture (routine x 2)     Status: None   Collection Time: 09/15/20  8:17 PM   Specimen: BLOOD  Result Value Ref Range Status   Specimen Description BLOOD BLOOD LEFT FOREARM  Final   Special Requests   Final    BOTTLES DRAWN AEROBIC AND ANAEROBIC Blood Culture adequate volume   Culture   Final    NO GROWTH 5 DAYS Performed at [facility], [address], [place], [place] [address]    Report Status 09/20/2020 FINAL  Final  Urine Culture     Status: None   Collection Time: 09/15/20  8:17 PM   Specimen: In/Out Cath Urine  Result Value Ref Range Status   Specimen Description   Final    IN/OUT CATH URINE Performed at [facility], [address], [place], [place] [address]    Special  Requests   Final    NONE Performed at [facility], [address] [place], [place] [address]    Culture   Final    NO GROWTH Performed at [facility], [address], [place], [place] [address]  Report Status 09/17/2020 FINAL  Final  Blood Culture (routine x 2)     Status: None   Collection Time: 09/15/20  8:20 PM   Specimen: BLOOD  Result Value Ref Range Status   Specimen Description BLOOD BLOOD LEFT HAND  Final   Special Requests   Final    BOTTLES DRAWN AEROBIC AND ANAEROBIC Blood Culture adequate volume   Culture   Final    NO GROWTH 5 DAYS Performed at [facility], [address] [place], [place] [address]    Report Status 09/20/2020 FINAL  Final    FURTHER DISCHARGE INSTRUCTIONS:  Get Medicines reviewed and adjusted: Please take all your medications with you for your next visit with your Primary MD  Laboratory/radiological data: Please request your Primary MD to go over all hospital tests and procedure/radiological results at the follow up, please ask your Primary MD to get all Hospital records sent to his/her office.  In some cases, they will be blood work, cultures and biopsy results pending at the time of your discharge. Please request that your primary care M.D. goes through all the records of your hospital data and follows up on these results.  Also Note the following: If you experience worsening of your admission symptoms, develop shortness of breath, life threatening emergency, suicidal or homicidal thoughts you must seek medical attention immediately by calling 911 or calling your MD immediately  if symptoms less severe.  You must read complete instructions/literature along with all the possible adverse reactions/side effects for all the Medicines you take and that have been prescribed to you. Take any new Medicines after you have completely understood and accpet all the possible adverse reactions/side effects.   Do not drive when  taking Pain medications or sleeping medications (Benzodaizepines)  Do not take more than prescribed Pain, Sleep and Anxiety Medications. It is not advisable to combine anxiety,sleep and pain medications without talking with your primary care practitioner  Special Instructions: If you have smoked or chewed Tobacco  in the last 2 yrs please stop smoking, stop any regular Alcohol  and or any Recreational drug use.  Wear Seat belts while driving.  Please note: You were cared for by a hospitalist during your hospital stay. Once you are discharged, your primary care physician will handle any further medical issues. Please note that NO REFILLS for any discharge medications will be authorized once you are discharged, as it is imperative that you return to your primary care physician (or establish a relationship with a primary care physician if you do not have one) for your post hospital discharge needs so that they can reassess your need for medications and monitor your lab values.  Total Time spent coordinating discharge including counseling, education and face to face time equals 35 minutes.  SignedOren Binet 09/20/2020 12:40 PM

## 2020-09-21 DIAGNOSIS — G9341 Metabolic encephalopathy: Secondary | ICD-10-CM | POA: Diagnosis not present

## 2020-09-21 DIAGNOSIS — G40909 Epilepsy, unspecified, not intractable, without status epilepticus: Secondary | ICD-10-CM | POA: Diagnosis not present

## 2020-09-21 DIAGNOSIS — U071 COVID-19: Secondary | ICD-10-CM | POA: Diagnosis not present

## 2020-09-21 DIAGNOSIS — I129 Hypertensive chronic kidney disease with stage 1 through stage 4 chronic kidney disease, or unspecified chronic kidney disease: Secondary | ICD-10-CM | POA: Diagnosis not present

## 2020-09-21 DIAGNOSIS — N1832 Chronic kidney disease, stage 3b: Secondary | ICD-10-CM | POA: Diagnosis not present

## 2020-09-21 DIAGNOSIS — F0280 Dementia in other diseases classified elsewhere without behavioral disturbance: Secondary | ICD-10-CM | POA: Diagnosis not present

## 2020-09-21 DIAGNOSIS — D631 Anemia in chronic kidney disease: Secondary | ICD-10-CM | POA: Diagnosis not present

## 2020-09-21 DIAGNOSIS — G44229 Chronic tension-type headache, not intractable: Secondary | ICD-10-CM | POA: Diagnosis not present

## 2020-09-21 DIAGNOSIS — N179 Acute kidney failure, unspecified: Secondary | ICD-10-CM | POA: Diagnosis not present

## 2020-09-24 DIAGNOSIS — G9341 Metabolic encephalopathy: Secondary | ICD-10-CM | POA: Diagnosis not present

## 2020-09-24 DIAGNOSIS — F0280 Dementia in other diseases classified elsewhere without behavioral disturbance: Secondary | ICD-10-CM | POA: Diagnosis not present

## 2020-09-24 DIAGNOSIS — I129 Hypertensive chronic kidney disease with stage 1 through stage 4 chronic kidney disease, or unspecified chronic kidney disease: Secondary | ICD-10-CM | POA: Diagnosis not present

## 2020-09-24 DIAGNOSIS — N1832 Chronic kidney disease, stage 3b: Secondary | ICD-10-CM | POA: Diagnosis not present

## 2020-09-24 DIAGNOSIS — N179 Acute kidney failure, unspecified: Secondary | ICD-10-CM | POA: Diagnosis not present

## 2020-09-24 DIAGNOSIS — D631 Anemia in chronic kidney disease: Secondary | ICD-10-CM | POA: Diagnosis not present

## 2020-09-24 DIAGNOSIS — U071 COVID-19: Secondary | ICD-10-CM | POA: Diagnosis not present

## 2020-09-24 DIAGNOSIS — G40909 Epilepsy, unspecified, not intractable, without status epilepticus: Secondary | ICD-10-CM | POA: Diagnosis not present

## 2020-09-24 DIAGNOSIS — G44229 Chronic tension-type headache, not intractable: Secondary | ICD-10-CM | POA: Diagnosis not present

## 2020-09-26 DIAGNOSIS — G40909 Epilepsy, unspecified, not intractable, without status epilepticus: Secondary | ICD-10-CM | POA: Diagnosis not present

## 2020-09-26 DIAGNOSIS — D631 Anemia in chronic kidney disease: Secondary | ICD-10-CM | POA: Diagnosis not present

## 2020-09-26 DIAGNOSIS — U071 COVID-19: Secondary | ICD-10-CM | POA: Diagnosis not present

## 2020-09-26 DIAGNOSIS — G44229 Chronic tension-type headache, not intractable: Secondary | ICD-10-CM | POA: Diagnosis not present

## 2020-09-26 DIAGNOSIS — G9341 Metabolic encephalopathy: Secondary | ICD-10-CM | POA: Diagnosis not present

## 2020-09-26 DIAGNOSIS — F0280 Dementia in other diseases classified elsewhere without behavioral disturbance: Secondary | ICD-10-CM | POA: Diagnosis not present

## 2020-09-26 DIAGNOSIS — N179 Acute kidney failure, unspecified: Secondary | ICD-10-CM | POA: Diagnosis not present

## 2020-09-26 DIAGNOSIS — N1832 Chronic kidney disease, stage 3b: Secondary | ICD-10-CM | POA: Diagnosis not present

## 2020-09-26 DIAGNOSIS — I129 Hypertensive chronic kidney disease with stage 1 through stage 4 chronic kidney disease, or unspecified chronic kidney disease: Secondary | ICD-10-CM | POA: Diagnosis not present

## 2020-09-27 DIAGNOSIS — Z09 Encounter for follow-up examination after completed treatment for conditions other than malignant neoplasm: Secondary | ICD-10-CM | POA: Diagnosis not present

## 2020-09-27 DIAGNOSIS — D508 Other iron deficiency anemias: Secondary | ICD-10-CM | POA: Diagnosis not present

## 2020-09-27 DIAGNOSIS — U071 COVID-19: Secondary | ICD-10-CM | POA: Diagnosis not present

## 2020-09-28 DIAGNOSIS — U071 COVID-19: Secondary | ICD-10-CM | POA: Diagnosis not present

## 2020-09-28 DIAGNOSIS — N179 Acute kidney failure, unspecified: Secondary | ICD-10-CM | POA: Diagnosis not present

## 2020-09-28 DIAGNOSIS — G44229 Chronic tension-type headache, not intractable: Secondary | ICD-10-CM | POA: Diagnosis not present

## 2020-09-28 DIAGNOSIS — F0280 Dementia in other diseases classified elsewhere without behavioral disturbance: Secondary | ICD-10-CM | POA: Diagnosis not present

## 2020-09-28 DIAGNOSIS — I129 Hypertensive chronic kidney disease with stage 1 through stage 4 chronic kidney disease, or unspecified chronic kidney disease: Secondary | ICD-10-CM | POA: Diagnosis not present

## 2020-09-28 DIAGNOSIS — D631 Anemia in chronic kidney disease: Secondary | ICD-10-CM | POA: Diagnosis not present

## 2020-09-28 DIAGNOSIS — G40909 Epilepsy, unspecified, not intractable, without status epilepticus: Secondary | ICD-10-CM | POA: Diagnosis not present

## 2020-09-28 DIAGNOSIS — G9341 Metabolic encephalopathy: Secondary | ICD-10-CM | POA: Diagnosis not present

## 2020-09-28 DIAGNOSIS — N1832 Chronic kidney disease, stage 3b: Secondary | ICD-10-CM | POA: Diagnosis not present

## 2020-10-02 DIAGNOSIS — N179 Acute kidney failure, unspecified: Secondary | ICD-10-CM | POA: Diagnosis not present

## 2020-10-02 DIAGNOSIS — G40909 Epilepsy, unspecified, not intractable, without status epilepticus: Secondary | ICD-10-CM | POA: Diagnosis not present

## 2020-10-02 DIAGNOSIS — U071 COVID-19: Secondary | ICD-10-CM | POA: Diagnosis not present

## 2020-10-02 DIAGNOSIS — I129 Hypertensive chronic kidney disease with stage 1 through stage 4 chronic kidney disease, or unspecified chronic kidney disease: Secondary | ICD-10-CM | POA: Diagnosis not present

## 2020-10-02 DIAGNOSIS — D631 Anemia in chronic kidney disease: Secondary | ICD-10-CM | POA: Diagnosis not present

## 2020-10-02 DIAGNOSIS — G44229 Chronic tension-type headache, not intractable: Secondary | ICD-10-CM | POA: Diagnosis not present

## 2020-10-02 DIAGNOSIS — F0280 Dementia in other diseases classified elsewhere without behavioral disturbance: Secondary | ICD-10-CM | POA: Diagnosis not present

## 2020-10-02 DIAGNOSIS — G9341 Metabolic encephalopathy: Secondary | ICD-10-CM | POA: Diagnosis not present

## 2020-10-02 DIAGNOSIS — N1832 Chronic kidney disease, stage 3b: Secondary | ICD-10-CM | POA: Diagnosis not present

## 2020-10-03 DIAGNOSIS — N1832 Chronic kidney disease, stage 3b: Secondary | ICD-10-CM | POA: Diagnosis not present

## 2020-10-03 DIAGNOSIS — F0280 Dementia in other diseases classified elsewhere without behavioral disturbance: Secondary | ICD-10-CM | POA: Diagnosis not present

## 2020-10-03 DIAGNOSIS — U071 COVID-19: Secondary | ICD-10-CM | POA: Diagnosis not present

## 2020-10-03 DIAGNOSIS — G40909 Epilepsy, unspecified, not intractable, without status epilepticus: Secondary | ICD-10-CM | POA: Diagnosis not present

## 2020-10-03 DIAGNOSIS — N179 Acute kidney failure, unspecified: Secondary | ICD-10-CM | POA: Diagnosis not present

## 2020-10-03 DIAGNOSIS — G44229 Chronic tension-type headache, not intractable: Secondary | ICD-10-CM | POA: Diagnosis not present

## 2020-10-03 DIAGNOSIS — G9341 Metabolic encephalopathy: Secondary | ICD-10-CM | POA: Diagnosis not present

## 2020-10-03 DIAGNOSIS — D631 Anemia in chronic kidney disease: Secondary | ICD-10-CM | POA: Diagnosis not present

## 2020-10-03 DIAGNOSIS — I129 Hypertensive chronic kidney disease with stage 1 through stage 4 chronic kidney disease, or unspecified chronic kidney disease: Secondary | ICD-10-CM | POA: Diagnosis not present

## 2020-10-04 DIAGNOSIS — F0280 Dementia in other diseases classified elsewhere without behavioral disturbance: Secondary | ICD-10-CM | POA: Diagnosis not present

## 2020-10-04 DIAGNOSIS — N1832 Chronic kidney disease, stage 3b: Secondary | ICD-10-CM | POA: Diagnosis not present

## 2020-10-04 DIAGNOSIS — N179 Acute kidney failure, unspecified: Secondary | ICD-10-CM | POA: Diagnosis not present

## 2020-10-04 DIAGNOSIS — D631 Anemia in chronic kidney disease: Secondary | ICD-10-CM | POA: Diagnosis not present

## 2020-10-04 DIAGNOSIS — I129 Hypertensive chronic kidney disease with stage 1 through stage 4 chronic kidney disease, or unspecified chronic kidney disease: Secondary | ICD-10-CM | POA: Diagnosis not present

## 2020-10-04 DIAGNOSIS — G40909 Epilepsy, unspecified, not intractable, without status epilepticus: Secondary | ICD-10-CM | POA: Diagnosis not present

## 2020-10-04 DIAGNOSIS — G44229 Chronic tension-type headache, not intractable: Secondary | ICD-10-CM | POA: Diagnosis not present

## 2020-10-04 DIAGNOSIS — G9341 Metabolic encephalopathy: Secondary | ICD-10-CM | POA: Diagnosis not present

## 2020-10-04 DIAGNOSIS — U071 COVID-19: Secondary | ICD-10-CM | POA: Diagnosis not present

## 2020-10-08 DIAGNOSIS — U071 COVID-19: Secondary | ICD-10-CM | POA: Diagnosis not present

## 2020-10-08 DIAGNOSIS — G44229 Chronic tension-type headache, not intractable: Secondary | ICD-10-CM | POA: Diagnosis not present

## 2020-10-08 DIAGNOSIS — F0280 Dementia in other diseases classified elsewhere without behavioral disturbance: Secondary | ICD-10-CM | POA: Diagnosis not present

## 2020-10-08 DIAGNOSIS — N179 Acute kidney failure, unspecified: Secondary | ICD-10-CM | POA: Diagnosis not present

## 2020-10-08 DIAGNOSIS — G9341 Metabolic encephalopathy: Secondary | ICD-10-CM | POA: Diagnosis not present

## 2020-10-08 DIAGNOSIS — N1832 Chronic kidney disease, stage 3b: Secondary | ICD-10-CM | POA: Diagnosis not present

## 2020-10-08 DIAGNOSIS — G40909 Epilepsy, unspecified, not intractable, without status epilepticus: Secondary | ICD-10-CM | POA: Diagnosis not present

## 2020-10-08 DIAGNOSIS — D631 Anemia in chronic kidney disease: Secondary | ICD-10-CM | POA: Diagnosis not present

## 2020-10-08 DIAGNOSIS — I129 Hypertensive chronic kidney disease with stage 1 through stage 4 chronic kidney disease, or unspecified chronic kidney disease: Secondary | ICD-10-CM | POA: Diagnosis not present

## 2020-10-10 DIAGNOSIS — I129 Hypertensive chronic kidney disease with stage 1 through stage 4 chronic kidney disease, or unspecified chronic kidney disease: Secondary | ICD-10-CM | POA: Diagnosis not present

## 2020-10-10 DIAGNOSIS — G40909 Epilepsy, unspecified, not intractable, without status epilepticus: Secondary | ICD-10-CM | POA: Diagnosis not present

## 2020-10-10 DIAGNOSIS — G44229 Chronic tension-type headache, not intractable: Secondary | ICD-10-CM | POA: Diagnosis not present

## 2020-10-10 DIAGNOSIS — N179 Acute kidney failure, unspecified: Secondary | ICD-10-CM | POA: Diagnosis not present

## 2020-10-10 DIAGNOSIS — N1832 Chronic kidney disease, stage 3b: Secondary | ICD-10-CM | POA: Diagnosis not present

## 2020-10-10 DIAGNOSIS — G9341 Metabolic encephalopathy: Secondary | ICD-10-CM | POA: Diagnosis not present

## 2020-10-10 DIAGNOSIS — D631 Anemia in chronic kidney disease: Secondary | ICD-10-CM | POA: Diagnosis not present

## 2020-10-10 DIAGNOSIS — U071 COVID-19: Secondary | ICD-10-CM | POA: Diagnosis not present

## 2020-10-10 DIAGNOSIS — F0280 Dementia in other diseases classified elsewhere without behavioral disturbance: Secondary | ICD-10-CM | POA: Diagnosis not present

## 2020-10-11 DIAGNOSIS — G44229 Chronic tension-type headache, not intractable: Secondary | ICD-10-CM | POA: Diagnosis not present

## 2020-10-11 DIAGNOSIS — N1832 Chronic kidney disease, stage 3b: Secondary | ICD-10-CM | POA: Diagnosis not present

## 2020-10-11 DIAGNOSIS — N179 Acute kidney failure, unspecified: Secondary | ICD-10-CM | POA: Diagnosis not present

## 2020-10-11 DIAGNOSIS — D631 Anemia in chronic kidney disease: Secondary | ICD-10-CM | POA: Diagnosis not present

## 2020-10-11 DIAGNOSIS — U071 COVID-19: Secondary | ICD-10-CM | POA: Diagnosis not present

## 2020-10-11 DIAGNOSIS — G9341 Metabolic encephalopathy: Secondary | ICD-10-CM | POA: Diagnosis not present

## 2020-10-11 DIAGNOSIS — G40909 Epilepsy, unspecified, not intractable, without status epilepticus: Secondary | ICD-10-CM | POA: Diagnosis not present

## 2020-10-11 DIAGNOSIS — F0280 Dementia in other diseases classified elsewhere without behavioral disturbance: Secondary | ICD-10-CM | POA: Diagnosis not present

## 2020-10-11 DIAGNOSIS — I129 Hypertensive chronic kidney disease with stage 1 through stage 4 chronic kidney disease, or unspecified chronic kidney disease: Secondary | ICD-10-CM | POA: Diagnosis not present

## 2020-10-12 DIAGNOSIS — F0280 Dementia in other diseases classified elsewhere without behavioral disturbance: Secondary | ICD-10-CM | POA: Diagnosis not present

## 2020-10-12 DIAGNOSIS — G40909 Epilepsy, unspecified, not intractable, without status epilepticus: Secondary | ICD-10-CM | POA: Diagnosis not present

## 2020-10-12 DIAGNOSIS — N1832 Chronic kidney disease, stage 3b: Secondary | ICD-10-CM | POA: Diagnosis not present

## 2020-10-12 DIAGNOSIS — I129 Hypertensive chronic kidney disease with stage 1 through stage 4 chronic kidney disease, or unspecified chronic kidney disease: Secondary | ICD-10-CM | POA: Diagnosis not present

## 2020-10-12 DIAGNOSIS — N179 Acute kidney failure, unspecified: Secondary | ICD-10-CM | POA: Diagnosis not present

## 2020-10-12 DIAGNOSIS — G44229 Chronic tension-type headache, not intractable: Secondary | ICD-10-CM | POA: Diagnosis not present

## 2020-10-12 DIAGNOSIS — U071 COVID-19: Secondary | ICD-10-CM | POA: Diagnosis not present

## 2020-10-12 DIAGNOSIS — D631 Anemia in chronic kidney disease: Secondary | ICD-10-CM | POA: Diagnosis not present

## 2020-10-12 DIAGNOSIS — G9341 Metabolic encephalopathy: Secondary | ICD-10-CM | POA: Diagnosis not present

## 2020-10-17 DIAGNOSIS — G9341 Metabolic encephalopathy: Secondary | ICD-10-CM | POA: Diagnosis not present

## 2020-10-17 DIAGNOSIS — N1832 Chronic kidney disease, stage 3b: Secondary | ICD-10-CM | POA: Diagnosis not present

## 2020-10-17 DIAGNOSIS — F0280 Dementia in other diseases classified elsewhere without behavioral disturbance: Secondary | ICD-10-CM | POA: Diagnosis not present

## 2020-10-17 DIAGNOSIS — U071 COVID-19: Secondary | ICD-10-CM | POA: Diagnosis not present

## 2020-10-17 DIAGNOSIS — D631 Anemia in chronic kidney disease: Secondary | ICD-10-CM | POA: Diagnosis not present

## 2020-10-17 DIAGNOSIS — I129 Hypertensive chronic kidney disease with stage 1 through stage 4 chronic kidney disease, or unspecified chronic kidney disease: Secondary | ICD-10-CM | POA: Diagnosis not present

## 2020-10-17 DIAGNOSIS — N179 Acute kidney failure, unspecified: Secondary | ICD-10-CM | POA: Diagnosis not present

## 2020-10-17 DIAGNOSIS — G40909 Epilepsy, unspecified, not intractable, without status epilepticus: Secondary | ICD-10-CM | POA: Diagnosis not present

## 2020-10-17 DIAGNOSIS — G44229 Chronic tension-type headache, not intractable: Secondary | ICD-10-CM | POA: Diagnosis not present

## 2020-10-19 DIAGNOSIS — R32 Unspecified urinary incontinence: Secondary | ICD-10-CM | POA: Diagnosis not present

## 2020-10-24 DIAGNOSIS — G40909 Epilepsy, unspecified, not intractable, without status epilepticus: Secondary | ICD-10-CM | POA: Diagnosis not present

## 2020-10-24 DIAGNOSIS — U071 COVID-19: Secondary | ICD-10-CM | POA: Diagnosis not present

## 2020-10-24 DIAGNOSIS — D631 Anemia in chronic kidney disease: Secondary | ICD-10-CM | POA: Diagnosis not present

## 2020-10-24 DIAGNOSIS — I129 Hypertensive chronic kidney disease with stage 1 through stage 4 chronic kidney disease, or unspecified chronic kidney disease: Secondary | ICD-10-CM | POA: Diagnosis not present

## 2020-10-24 DIAGNOSIS — G9341 Metabolic encephalopathy: Secondary | ICD-10-CM | POA: Diagnosis not present

## 2020-10-24 DIAGNOSIS — N1832 Chronic kidney disease, stage 3b: Secondary | ICD-10-CM | POA: Diagnosis not present

## 2020-10-24 DIAGNOSIS — N179 Acute kidney failure, unspecified: Secondary | ICD-10-CM | POA: Diagnosis not present

## 2020-10-24 DIAGNOSIS — G44229 Chronic tension-type headache, not intractable: Secondary | ICD-10-CM | POA: Diagnosis not present

## 2020-10-24 DIAGNOSIS — F0280 Dementia in other diseases classified elsewhere without behavioral disturbance: Secondary | ICD-10-CM | POA: Diagnosis not present

## 2020-10-31 DIAGNOSIS — U071 COVID-19: Secondary | ICD-10-CM | POA: Diagnosis not present

## 2020-10-31 DIAGNOSIS — D631 Anemia in chronic kidney disease: Secondary | ICD-10-CM | POA: Diagnosis not present

## 2020-10-31 DIAGNOSIS — N179 Acute kidney failure, unspecified: Secondary | ICD-10-CM | POA: Diagnosis not present

## 2020-10-31 DIAGNOSIS — G44229 Chronic tension-type headache, not intractable: Secondary | ICD-10-CM | POA: Diagnosis not present

## 2020-10-31 DIAGNOSIS — G40909 Epilepsy, unspecified, not intractable, without status epilepticus: Secondary | ICD-10-CM | POA: Diagnosis not present

## 2020-10-31 DIAGNOSIS — N1832 Chronic kidney disease, stage 3b: Secondary | ICD-10-CM | POA: Diagnosis not present

## 2020-10-31 DIAGNOSIS — G9341 Metabolic encephalopathy: Secondary | ICD-10-CM | POA: Diagnosis not present

## 2020-10-31 DIAGNOSIS — I129 Hypertensive chronic kidney disease with stage 1 through stage 4 chronic kidney disease, or unspecified chronic kidney disease: Secondary | ICD-10-CM | POA: Diagnosis not present

## 2020-10-31 DIAGNOSIS — F0280 Dementia in other diseases classified elsewhere without behavioral disturbance: Secondary | ICD-10-CM | POA: Diagnosis not present

## 2020-11-12 ENCOUNTER — Ambulatory Visit (INDEPENDENT_AMBULATORY_CARE_PROVIDER_SITE_OTHER): Payer: Medicare HMO | Admitting: Family Medicine

## 2020-11-12 ENCOUNTER — Other Ambulatory Visit: Payer: Self-pay

## 2020-11-12 DIAGNOSIS — C61 Malignant neoplasm of prostate: Secondary | ICD-10-CM

## 2020-11-12 MED ORDER — LEUPROLIDE ACETATE (6 MONTH) 45 MG ~~LOC~~ KIT
45.0000 mg | PACK | Freq: Once | SUBCUTANEOUS | Status: AC
  Administered 2020-11-12: 45 mg via SUBCUTANEOUS

## 2020-11-12 NOTE — Progress Notes (Signed)
Eligard SubQ Injection   Due to Prostate Cancer patient is present today for a Eligard Injection.  Medication: Eligard 6 month Dose: 45 mg  Location: right  Lot: [id number] Exp: 04/2022  Patient tolerated well, no complications were noted  Performed by: Elberta Leatherwood, Ida  Per Dr. Bernardo Heater patient is to continue therapy for 2 years. He should be completed with therapy. Patient is scheduled on 01/23/2021. This appointment was scheduled using wheel and given to patient today along with reminder continue on Vitamin D 800-1000iu and Calium 1000-1200mg  daily while on Androgen Deprivation Therapy.  PA approval dates:  No approval needed.

## 2020-11-19 ENCOUNTER — Other Ambulatory Visit: Payer: Self-pay | Admitting: Urology

## 2020-12-13 DIAGNOSIS — R32 Unspecified urinary incontinence: Secondary | ICD-10-CM | POA: Diagnosis not present

## 2020-12-17 DIAGNOSIS — R2681 Unsteadiness on feet: Secondary | ICD-10-CM | POA: Diagnosis not present

## 2020-12-17 DIAGNOSIS — E7800 Pure hypercholesterolemia, unspecified: Secondary | ICD-10-CM | POA: Diagnosis not present

## 2020-12-17 DIAGNOSIS — R569 Unspecified convulsions: Secondary | ICD-10-CM | POA: Diagnosis not present

## 2020-12-17 DIAGNOSIS — Z125 Encounter for screening for malignant neoplasm of prostate: Secondary | ICD-10-CM | POA: Diagnosis not present

## 2020-12-17 DIAGNOSIS — F0390 Unspecified dementia without behavioral disturbance: Secondary | ICD-10-CM | POA: Diagnosis not present

## 2020-12-17 DIAGNOSIS — D508 Other iron deficiency anemias: Secondary | ICD-10-CM | POA: Diagnosis not present

## 2020-12-17 DIAGNOSIS — I10 Essential (primary) hypertension: Secondary | ICD-10-CM | POA: Diagnosis not present

## 2020-12-17 DIAGNOSIS — Z23 Encounter for immunization: Secondary | ICD-10-CM | POA: Diagnosis not present

## 2020-12-17 DIAGNOSIS — D513 Other dietary vitamin B12 deficiency anemia: Secondary | ICD-10-CM | POA: Diagnosis not present

## 2020-12-19 ENCOUNTER — Other Ambulatory Visit: Payer: Self-pay | Admitting: Urology

## 2021-01-11 ENCOUNTER — Other Ambulatory Visit: Payer: Self-pay | Admitting: Urology

## 2021-01-21 DIAGNOSIS — I700 Atherosclerosis of aorta: Secondary | ICD-10-CM | POA: Diagnosis not present

## 2021-01-21 DIAGNOSIS — M24541 Contracture, right hand: Secondary | ICD-10-CM | POA: Diagnosis not present

## 2021-01-23 ENCOUNTER — Other Ambulatory Visit: Payer: Self-pay

## 2021-01-23 ENCOUNTER — Other Ambulatory Visit: Payer: Self-pay | Admitting: Orthopedic Surgery

## 2021-01-23 ENCOUNTER — Ambulatory Visit (INDEPENDENT_AMBULATORY_CARE_PROVIDER_SITE_OTHER): Payer: Medicare HMO | Admitting: Urology

## 2021-01-23 ENCOUNTER — Encounter: Payer: Self-pay | Admitting: Urology

## 2021-01-23 VITALS — BP 150/75 | HR 76 | Ht 66.0 in | Wt 152.0 lb

## 2021-01-23 DIAGNOSIS — C61 Malignant neoplasm of prostate: Secondary | ICD-10-CM | POA: Diagnosis not present

## 2021-01-23 NOTE — Progress Notes (Signed)
01/23/2021 10:32 AM   Jeremy [name] [birth year] [id number]  Referring provider: Idelle Crouch, MD [facility] [place],  [place] [address]  Chief Complaint  Patient presents with   Prostate Cancer    Urologic history: 1. cT1c high risk prostate cancer  - diagnosed October 2019; PSA 11; 38 cc  -Multiple cores positive Gleason 3+4, 4+3 and 4+4 -Treated IMRT + ADT completed 5/20 -Initial leuprolide injection 04/2018; completed ADT 10/2020  HPI: 75 y.o. male presents for follow-up of prostate cancer.  Completed 2 years ADT 11/12/2020 No complaints since last years visit No bothersome LUTS PSA 12/07/2020 undetectable; <0.01 Caregiver today states he has an appointment scheduled in radiation oncology   PMH: Past Medical History:  Diagnosis Date   Cancer ([place]) 2019   prostate /radiation   Dementia ([place])    Hypertension    Seizures ([place])    LAST 6 MO  AGO    Surgical History: Past Surgical History:  Procedure Laterality Date   none     PROSTATE BIOPSY N/A 12/15/2017   Procedure: BIOPSY TRANSRECTAL ULTRASONIC PROSTATE (TUBP);  Surgeon: [name], MD;  Location: [facility] ORS;  Service: Urology;  Laterality: N/A;   ROBOT ASSISTED INGUINAL HERNIA REPAIR Right 09/02/2018   Procedure: ROBOT ASSISTED INGUINAL HERNIA REPAIR AND UMBILICAL HERNIA, RIGHT;  Surgeon: Jules Husbands, MD;  Location: [facility] ORS;  Service: General;  Laterality: Right;    Home Medications:  Allergies as of 01/23/2021   No Known Allergies      Medication List        Accurate as of January 23, 2021 10:32 AM. If you have any questions, ask your nurse or doctor.          acetaminophen 325 MG tablet Commonly known as: TYLENOL Take 650 mg by mouth every 4 (four) hours as needed for moderate pain, fever or headache.   alum & mag hydroxide-simeth 200-200-20 MG/5ML suspension Commonly known as: MAALOX/MYLANTA Take 30 mLs by mouth daily as needed for  indigestion or heartburn.   Calcium + Vitamin D3 600-10 MG-MCG Tabs Generic drug: Calcium Carb-Cholecalciferol TAKE 1 TABLET BY MOUTH 2 TIMES DAILY   guaiFENesin 100 MG/5ML liquid Commonly known as: ROBITUSSIN Take 200 mg by mouth every 6 (six) hours as needed for cough.   lamoTRIgine 25 MG tablet Commonly known as: LAMICTAL Take 50 mg by mouth 2 (two) times daily.   levETIRAcetam 500 MG tablet Commonly known as: KEPPRA Take 500 mg by mouth 2 (two) times daily.   loperamide 2 MG capsule Commonly known as: IMODIUM Take 2 mg by mouth 4 (four) times daily as needed for diarrhea or loose stools.   magnesium hydroxide 400 MG/5ML suspension Commonly known as: MILK OF MAGNESIA Take 30 mLs by mouth daily as needed for mild constipation.   risperiDONE 2 MG tablet Commonly known as: RISPERDAL Take 1 mg by mouth 3 (three) times daily.   rosuvastatin 10 MG tablet Commonly known as: CRESTOR Take 10 mg by mouth daily.   tamsulosin 0.4 MG Caps capsule Commonly known as: FLOMAX Take 0.4 mg by mouth daily after supper.        Allergies: No Known Allergies  Family History: No family history on file.  Social History:  reports that he has quit smoking. He has never used smokeless tobacco. He reports that he does not currently use alcohol. He reports that he does not currently use drugs after having used the following drugs: Cocaine and  IV.   Physical Exam: BP (!) 150/75   Pulse 76   Ht 5\' 6"  (1.676 m)   Wt 152 lb (68.9 kg)   BMI 24.53 kg/m   Constitutional:  Alert, No acute distress. HEENT: [place] AT, moist mucus membranes.  Trachea midline, no masses. Cardiovascular: No clubbing, cyanosis, or edema. Respiratory: Normal respiratory effort, no increased work of breathing.    Assessment & Plan:    1.  T1c high risk prostate cancer Status post IMRT + ADT Completed ADT 10/2020 PSA undetectable Follow-up 1 year with PSA Continue radiation oncology follow-up   [name], MD  [facility] [address], [place], [place] [address] [phone]

## 2021-02-04 ENCOUNTER — Other Ambulatory Visit: Payer: Self-pay

## 2021-02-04 ENCOUNTER — Other Ambulatory Visit
Admission: RE | Admit: 2021-02-04 | Discharge: 2021-02-04 | Disposition: A | Discharge status: Home / Self Care | Payer: Medicare HMO | Source: Ambulatory Visit | Attending: Orthopedic Surgery | Admitting: Orthopedic Surgery

## 2021-02-04 NOTE — Patient Instructions (Addendum)
Your procedure is scheduled on: Tuesday, December 27 Report to the Registration Desk on the 1st floor of the [facility]. To find out your arrival time, please call [phone] between 1PM - 3PM on: Monday, December 26  REMEMBER: Instructions that are not followed completely may result in serious medical risk, up to and including death; or upon the discretion of your surgeon and anesthesiologist your surgery may need to be rescheduled.  Do not eat food after midnight the night before surgery.  No gum chewing, lozengers or hard candies.  You may however, drink CLEAR liquids up to 2 hours before you are scheduled to arrive for your surgery. Do not drink anything within 2 hours of your scheduled arrival time.  Clear liquids include: - water  - apple juice without pulp - gatorade (not RED, PURPLE, OR BLUE) - black coffee or tea (Do NOT add milk or creamers to the coffee or tea) Do NOT drink anything that is not on this list.  In addition, your doctor has ordered for you to drink the provided  Ensure Pre-Surgery Clear Carbohydrate Drink  Drinking this carbohydrate drink up to two hours before surgery helps to reduce insulin resistance and improve patient outcomes. Please complete drinking 2 hours prior to scheduled arrival time.  TAKE THESE MEDICATIONS THE MORNING OF SURGERY WITH A SIP OF WATER:  Lamotrigine (Lamictal) Levetiracetam (Keppra) Risperidone (Risperdal) Rosuvastatin (Crestor)  One week prior to surgery: starting December 20 Stop Anti-inflammatories (NSAIDS) such as Advil, Aleve, Ibuprofen, Motrin, Naproxen, Naprosyn and Aspirin based products such as Excedrin, Goodys Powder, BC Powder. Stop ANY OVER THE COUNTER supplements until after surgery. Stop Calcium. You may however, continue to take Tylenol if needed for pain up until the day of surgery.  No Alcohol for 24 hours before or after surgery.  No Smoking including e-cigarettes for 24 hours prior to surgery.  No  chewable tobacco products for at least 6 hours prior to surgery.  No nicotine patches on the day of surgery.  Do not use any "recreational" drugs for at least a week prior to your surgery.  Please be advised that the combination of cocaine and anesthesia may have negative outcomes, up to and including death. If you test positive for cocaine, your surgery will be cancelled.  On the morning of surgery brush your teeth with toothpaste and water, you may rinse your mouth with mouthwash if you wish. Do not swallow any toothpaste or mouthwash.  Use CHG Soap as directed on instruction sheet.  Do not wear jewelry, make-up, hairpins, clips or nail polish.  Do not wear lotions, powders, or perfumes.   Do not shave body from the neck down 48 hours prior to surgery just in case you cut yourself which could leave a site for infection.  Also, freshly shaved skin may become irritated if using the CHG soap.  Contact lenses, hearing aids and dentures may not be worn into surgery.  Do not bring valuables to the hospital. [facility] is not responsible for any missing/lost belongings or valuables.   Notify your doctor if there is any change in your medical condition (cold, fever, infection).  Wear comfortable clothing (specific to your surgery type) to the hospital.  After surgery, you can help prevent lung complications by doing breathing exercises.  Take deep breaths and cough every 1-2 hours. Your doctor may order a device called an Incentive Spirometer to help you take deep breaths.  If you are being discharged the day of surgery, you  will not be allowed to drive home. You will need a responsible adult (18 years or older) to drive you home and stay with you that night.   If you are taking public transportation, you will need to have a responsible adult (18 years or older) with you. Please confirm with your physician that it is acceptable to use public transportation.   Please call the  [facility] at [phone] if you have any questions about these instructions.  Surgery Visitation Policy:  Patients undergoing a surgery or procedure may have one family member or support person with them as long as that person is not COVID-19 positive or experiencing its symptoms.  That person may remain in the waiting area during the procedure and may rotate out with other people.

## 2021-02-12 ENCOUNTER — Other Ambulatory Visit: Payer: Self-pay

## 2021-02-12 ENCOUNTER — Ambulatory Visit
Admission: RE | Admit: 2021-02-12 | Discharge: 2021-02-12 | Disposition: A | Discharge status: Home / Self Care | Payer: Medicare HMO | Attending: Orthopedic Surgery | Admitting: Orthopedic Surgery

## 2021-02-12 ENCOUNTER — Encounter: Payer: Self-pay | Admitting: Orthopedic Surgery

## 2021-02-12 ENCOUNTER — Ambulatory Visit: Payer: Medicare HMO | Admitting: Anesthesiology

## 2021-02-12 ENCOUNTER — Ambulatory Visit: Payer: Medicare HMO

## 2021-02-12 ENCOUNTER — Encounter
Admission: RE | Disposition: A | Discharge status: Home / Self Care | Payer: Self-pay | Source: Home / Self Care | Attending: Orthopedic Surgery

## 2021-02-12 DIAGNOSIS — Z87891 Personal history of nicotine dependence: Secondary | ICD-10-CM | POA: Diagnosis not present

## 2021-02-12 DIAGNOSIS — M24541 Contracture, right hand: Secondary | ICD-10-CM | POA: Diagnosis not present

## 2021-02-12 DIAGNOSIS — I10 Essential (primary) hypertension: Secondary | ICD-10-CM | POA: Diagnosis not present

## 2021-02-12 DIAGNOSIS — Z8546 Personal history of malignant neoplasm of prostate: Secondary | ICD-10-CM | POA: Insufficient documentation

## 2021-02-12 DIAGNOSIS — F0390 Unspecified dementia without behavioral disturbance: Secondary | ICD-10-CM | POA: Insufficient documentation

## 2021-02-12 DIAGNOSIS — I700 Atherosclerosis of aorta: Secondary | ICD-10-CM | POA: Diagnosis not present

## 2021-02-12 DIAGNOSIS — R569 Unspecified convulsions: Secondary | ICD-10-CM | POA: Insufficient documentation

## 2021-02-12 DIAGNOSIS — Z923 Personal history of irradiation: Secondary | ICD-10-CM | POA: Insufficient documentation

## 2021-02-12 HISTORY — PX: FLEXOR TENDON REPAIR: SHX6501

## 2021-02-12 SURGERY — REPAIR, TENDON, FLEXOR
Anesthesia: General | Site: Hand | Laterality: Right

## 2021-02-12 MED ORDER — SODIUM CHLORIDE FLUSH 0.9 % IV SOLN
INTRAVENOUS | Status: AC
  Filled 2021-02-12: qty 10

## 2021-02-12 MED ORDER — 0.9 % SODIUM CHLORIDE (POUR BTL) OPTIME
TOPICAL | Status: DC | PRN
  Administered 2021-02-12: 12:00:00 500 mL

## 2021-02-12 MED ORDER — FENTANYL CITRATE (PF) 100 MCG/2ML IJ SOLN
INTRAMUSCULAR | Status: DC | PRN
  Administered 2021-02-12 (×3): 25 ug via INTRAVENOUS

## 2021-02-12 MED ORDER — BUPIVACAINE HCL (PF) 0.5 % IJ SOLN
INTRAMUSCULAR | Status: DC | PRN
  Administered 2021-02-12: 15 mL

## 2021-02-12 MED ORDER — BUPIVACAINE-EPINEPHRINE (PF) 0.5% -1:200000 IJ SOLN
INTRAMUSCULAR | Status: AC
  Filled 2021-02-12: qty 30

## 2021-02-12 MED ORDER — LIDOCAINE HCL (PF) 1 % IJ SOLN
INTRAMUSCULAR | Status: AC
  Filled 2021-02-12: qty 30

## 2021-02-12 MED ORDER — LIDOCAINE HCL (CARDIAC) PF 100 MG/5ML IV SOSY
PREFILLED_SYRINGE | INTRAVENOUS | Status: DC | PRN
  Administered 2021-02-12: 60 mg via INTRAVENOUS

## 2021-02-12 MED ORDER — ACETAMINOPHEN 10 MG/ML IV SOLN
INTRAVENOUS | Status: AC
  Filled 2021-02-12: qty 100

## 2021-02-12 MED ORDER — FAMOTIDINE 20 MG PO TABS: 20.0000 mg | ORAL_TABLET | Freq: Once | ORAL | Status: AC

## 2021-02-12 MED ORDER — SODIUM CHLORIDE 0.9 % IV SOLN: INTRAVENOUS | Status: DC

## 2021-02-12 MED ORDER — LIDOCAINE HCL (PF) 2 % IJ SOLN
INTRAMUSCULAR | Status: AC
  Filled 2021-02-12: qty 5

## 2021-02-12 MED ORDER — PROPOFOL 10 MG/ML IV BOLUS
INTRAVENOUS | Status: AC
  Filled 2021-02-12: qty 20

## 2021-02-12 MED ORDER — ORAL CARE MOUTH RINSE: 15.0000 mL | Freq: Once | OROMUCOSAL | Status: AC

## 2021-02-12 MED ORDER — MIDAZOLAM HCL 2 MG/2ML IJ SOLN
INTRAMUSCULAR | Status: DC | PRN
  Administered 2021-02-12: 1 mg via INTRAVENOUS

## 2021-02-12 MED ORDER — LACTATED RINGERS IV SOLN: INTRAVENOUS | Status: DC

## 2021-02-12 MED ORDER — FENTANYL CITRATE (PF) 100 MCG/2ML IJ SOLN: 25.0000 ug | INTRAMUSCULAR | Status: DC | PRN

## 2021-02-12 MED ORDER — CHLORHEXIDINE GLUCONATE 0.12 % MT SOLN: 15.0000 mL | Freq: Once | OROMUCOSAL | Status: AC

## 2021-02-12 MED ORDER — CEFAZOLIN SODIUM-DEXTROSE 2-4 GM/100ML-% IV SOLN
2.0000 g | INTRAVENOUS | Status: AC
  Administered 2021-02-12: 12:00:00 2 g via INTRAVENOUS

## 2021-02-12 MED ORDER — CEFAZOLIN SODIUM-DEXTROSE 2-4 GM/100ML-% IV SOLN
INTRAVENOUS | Status: AC
  Filled 2021-02-12: qty 100

## 2021-02-12 MED ORDER — FAMOTIDINE 20 MG PO TABS
ORAL_TABLET | ORAL | Status: AC
  Administered 2021-02-12: 10:00:00 20 mg via ORAL
  Filled 2021-02-12: qty 1

## 2021-02-12 MED ORDER — PROPOFOL 10 MG/ML IV BOLUS
INTRAVENOUS | Status: DC | PRN
  Administered 2021-02-12: 100 mg via INTRAVENOUS

## 2021-02-12 MED ORDER — ACETAMINOPHEN 10 MG/ML IV SOLN
INTRAVENOUS | Status: DC | PRN
  Administered 2021-02-12: 1000 mg via INTRAVENOUS

## 2021-02-12 MED ORDER — CHLORHEXIDINE GLUCONATE 0.12 % MT SOLN
OROMUCOSAL | Status: AC
  Administered 2021-02-12: 10:00:00 15 mL via OROMUCOSAL
  Filled 2021-02-12: qty 15

## 2021-02-12 MED ORDER — MIDAZOLAM HCL 2 MG/2ML IJ SOLN
INTRAMUSCULAR | Status: AC
  Filled 2021-02-12: qty 2

## 2021-02-12 MED ORDER — HYDROCODONE-ACETAMINOPHEN 5-325 MG PO TABS: 1.0000 | ORAL_TABLET | Freq: Four times a day (QID) | ORAL | 0 refills | Status: DC | PRN

## 2021-02-12 MED ORDER — FENTANYL CITRATE (PF) 100 MCG/2ML IJ SOLN
INTRAMUSCULAR | Status: AC
  Filled 2021-02-12: qty 2

## 2021-02-12 MED ORDER — BUPIVACAINE HCL (PF) 0.5 % IJ SOLN
INTRAMUSCULAR | Status: AC
  Filled 2021-02-12: qty 30

## 2021-02-12 SURGICAL SUPPLY — 31 items
APL PRP STRL LF DISP 70% ISPRP (MISCELLANEOUS) ×2
BNDG CMPR STD VLCR NS LF 5.8X2 (GAUZE/BANDAGES/DRESSINGS) ×1
BNDG ELASTIC 2X5.8 VLCR NS LF (GAUZE/BANDAGES/DRESSINGS) ×3 IMPLANT
BNDG ELASTIC 2X5.8 VLCR STR LF (GAUZE/BANDAGES/DRESSINGS) ×3 IMPLANT
CAST PADDING 2X4YD ST 30245 (MISCELLANEOUS) ×4
CHLORAPREP W/TINT 26 (MISCELLANEOUS) ×5 IMPLANT
CUFF TOURN SGL QUICK 18X4 (TOURNIQUET CUFF) IMPLANT
GAUZE 4X4 16PLY ~~LOC~~+RFID DBL (SPONGE) ×3 IMPLANT
GAUZE SPONGE 4X4 12PLY STRL (GAUZE/BANDAGES/DRESSINGS) ×2 IMPLANT
GAUZE XEROFORM 1X8 LF (GAUZE/BANDAGES/DRESSINGS) ×5 IMPLANT
GLOVE SURG SYN 9.0  PF PI (GLOVE) ×2
GLOVE SURG SYN 9.0 PF PI (GLOVE) ×1 IMPLANT
GOWN SRG 2XL LVL 4 RGLN SLV (GOWNS) ×1 IMPLANT
GOWN STRL NON-REIN 2XL LVL4 (GOWNS) ×3
GOWN STRL REUS W/ TWL LRG LVL3 (GOWN DISPOSABLE) ×1 IMPLANT
GOWN STRL REUS W/TWL LRG LVL3 (GOWN DISPOSABLE) ×3
K-WIRE SMTH SNGL TROCAR .045X9 (WIRE) ×9
KIT TURNOVER KIT A (KITS) ×3 IMPLANT
KWIRE SMTH SNGL TROCAR .045X9 (WIRE) IMPLANT
MANIFOLD NEPTUNE II (INSTRUMENTS) ×3 IMPLANT
NDL HYPO 25X1 1.5 SAFETY (NEEDLE) ×1 IMPLANT
NEEDLE HYPO 25X1 1.5 SAFETY (NEEDLE) ×3 IMPLANT
NS IRRIG 500ML POUR BTL (IV SOLUTION) ×3 IMPLANT
PACK EXTREMITY ARMC (MISCELLANEOUS) ×3 IMPLANT
PADDING CAST COTTON 2X4 ST (MISCELLANEOUS) ×1 IMPLANT
SCALPEL PROTECTED #15 DISP (BLADE) ×6 IMPLANT
SPLINT CAST 1 STEP 3X12 (MISCELLANEOUS) ×2 IMPLANT
SPONGE GAUZE 2X2 8PLY STER LF (GAUZE/BANDAGES/DRESSINGS) ×1
SPONGE GAUZE 2X2 8PLY STRL LF (GAUZE/BANDAGES/DRESSINGS) ×2 IMPLANT
SUT ETHILON 4 0 P 3 18 (SUTURE) ×3 IMPLANT
Zimmer Smooth K Wire 1.1 ×6 IMPLANT

## 2021-02-12 NOTE — H&P (Signed)
Chief Complaint  Patient presents with   Right Hand - Pain    History of the Present Illness: Jeremy [name] is a 75 y.o. male here today.   The patient presents for evaluation of right-hand contracture. The patient is accompanied by an adult male.   The patient denies having suffered from a stroke.  The male companion states the patient's right hand draws up. He does use his hand. The patient is not able to stretch his right hand, and they have been putting lotion on his right hand.  The patient has dementia.  The patient lives in a group home.  I have reviewed past medical, surgical, social and family history, and allergies as documented in the EMR.  Past Medical History: Past Medical History:  Diagnosis Date   History of cancer  prostate cancer.   Seizure (CMS-HCC) 08/29/2013   Past Surgical History: History reviewed. No pertinent surgical history.  Past Family History: Family History  Problem Relation Age of Onset   Cancer Mother  breast   Cancer Brother   Medications: Current Outpatient Medications Ordered in Epic  Medication Sig Dispense Refill   acetaminophen (TYLENOL) 325 MG tablet Take 2 tablets (650 mg total) by mouth every 4 (four) hours as needed for Pain 100 tablet 5   aluminum-magnesium hydroxide-simethicone, DRAH, (MI-ACID) 200-200-20 mg/5 mL suspension Take 20 mLs by mouth every 6 (six) hours as needed for Indigestion 355 mL 5   bismuth subsalicylate (PEPTO BISMOL) 262 mg chewable tablet Take 2 tablets (524 mg total) by mouth 4 (four) times daily as needed 100 tablet 5   calcium carbonate-vit D3-min 600 mg calcium- 400 unit Tab Take 1 tablet by mouth 2 (two) times daily   calcium carbonate-vitamin D3 (CALTRATE 600+D) 600 mg-10 mcg (400 unit) tablet TAKE 1 TABLET BY MOUTH 2 TIMES DAILY 60 tablet 5   guaiFENesin (MUCINEX) 100 mg/5 mL solution Take 10 mLs (200 mg total) by mouth every 4 (four) hours as needed for Cough 120 mL 5   lamoTRIgine (LAMICTAL)  25 MG tablet Take 2 tablets (50 mg total) by mouth 2 (two) times daily 120 tablet 3   levETIRAcetam (KEPPRA) 500 MG tablet Take 1 tablet (500 mg total) by mouth 2 (two) times daily 60 tablet 3   loperamide (IMODIUM) 2 mg capsule Take 2 mg by mouth 4 (four) times daily as needed for Diarrhea   magnesium hydroxide (MILK OF MAGNESIA) 400 mg/5 mL suspension Take 30 mLs by mouth once daily as needed for Constipation 473 mL 5   risperiDONE (RISPERDAL) 2 MG tablet TAKE 1/2 TABLET BY MOUTH 3 TIMES A DAY 45 tablet 5   rosuvastatin (CRESTOR) 10 MG tablet TAKE 1 TABLET BY MOUTH ONCE DAILY 30 tablet 11   tamsulosin (FLOMAX) 0.4 mg capsule TAKE 1 CAPSULE BY MOUTH ONCE DAILY. TAKE30 MINUTES AFTER SAME MEAL EACH DAY 30 capsule 11   Current Facility-Administered Medications Ordered in Epic  Medication Dose Route Frequency Provider Last Rate Last Admin   cyanocobalamin (VITAMIN B12) injection 1,000 mcg 1,000 mcg Intramuscular Q30 Days Idelle Crouch, MD 1,000 mcg at 12/17/20 1150   Allergies: No Known Allergies   Body mass index is 25.05 kg/m.  Review of Systems: A comprehensive 14 point ROS was performed, reviewed, and the pertinent orthopaedic findings are documented in the HPI.  Vitals:  01/21/21 0942  BP: 120/66    General Physical Examination:   General/Constitutional: No apparent distress: well-nourished and well developed. Eyes: Pupils equal, round with synchronous  movement. Lungs: Clear to auscultation HEENT: Normal Vascular: No edema, swelling or tenderness, except as noted in detailed exam. Cardiac: Heart rate and rhythm is regular. Integumentary: No impressive skin lesions present, except as noted in detailed exam. Neuro/Psych: Normal mood and affect, oriented to person, place and time.  On exam, right index finger gets almost straight, flexion 30 degrees at the IP joint and full extension at the MCP. Right long finger has 90 degrees flexion at the PIP, 45 degrees at the DIP. Right  ring finger and fifth finger also have 90 degrees flexion at the PIP, 45 degrees at the DIP and PIP contractures.  Radiographs:  AP, lateral, and oblique x-rays of the right hand were ordered and personally reviewed today. These show contracture of all the fingers. No carpal deformity. No significant degenerative changes to the wrist. Significant flexion deformities to the IP joints of all fingers and thumb at the MCP joint.  X-ray Impression Right hand contracture. No evidence of bony abnormality.  Assessment: ICD-10-CM  1. 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 Aortic atherosclerosis (CMS-HCC) I70.0   Plan:  The patient has clinical findings of right-hand contracture.  We discussed the patient's x-ray findings. I explained he has PIP contractures of his right hand, which is likely from muscle tightness. I recommend flexor slide in the forearm and pinning of the PIP joints in extension, splinting for 2 weeks, and keeping him from touching the incisions with all fingers and wrist in extension, and then pin removal and postoperative splinting to prevent recurrence of contracture.  We will schedule the patient for surgery in the near future.  Surgical Risks:  The nature of the condition and the proposed procedure has been reviewed in detail with the patient. Surgical versus non-surgical options and prognosis for recovery have been reviewed and the inherent risks and benefits of each have been discussed including the risks of infection, bleeding, injury to nerves/blood vessels/tendons, incomplete relief of symptoms, persisting pain and/or stiffness, loss of function, complex regional pain syndrome, failure of the procedure, as appropriate.  I, Rama Burnell Blanks, Quality Documentation Specialist, completed documentation using DAX technology.  Electronically signed by Lauris Poag, MD at 01/21/2021 3:48 PM EST  Reviewed  H+P. No changes noted.

## 2021-02-12 NOTE — Progress Notes (Signed)
Since patient does not have HCPOA, patient's closest next of kin (Brother) Edd Arbour Gunnells over the phone/verbally consented patient's procedure  witnessed with 2 RN's; Jordan Hawks and [employer]. Additionally, Anesthesiologist has talked to the brother as well.

## 2021-02-12 NOTE — Discharge Instructions (Addendum)
Keep arm elevated is much as possible. Keep splint and dressing clean and dry. Pain medicine as directed Call office if he is having problems  AMBULATORY SURGERY  DISCHARGE INSTRUCTIONS   The drugs that you were given will stay in your system until tomorrow so for the next 24 hours you should not:  Drive an automobile Make any legal decisions Drink any alcoholic beverage   You may resume regular meals tomorrow.  Today it is better to start with liquids and gradually work up to solid foods.  You may eat anything you prefer, but it is better to start with liquids, then soup and crackers, and gradually work up to solid foods.   Please notify your doctor immediately if you have any unusual bleeding, trouble breathing, redness and pain at the surgery site, drainage, fever, or pain not relieved by medication.    Additional Instructions:        Please contact your physician with any problems or Same Day Surgery at [phone], Monday through Friday 6 am to 4 pm, or [address] at [facility] number at [phone].

## 2021-02-12 NOTE — Anesthesia Preprocedure Evaluation (Signed)
Anesthesia Evaluation  Patient identified by MRN, date of birth, ID band Patient awake and Patient confused    Reviewed: Allergy & Precautions, H&P , NPO status , Patient's Chart, lab work & pertinent test results  History of Anesthesia Complications Negative for: history of anesthetic complications  Airway Mallampati: III  TM Distance: >3 FB Neck ROM: limited    Dental  (+) Chipped, Poor Dentition, Missing   Pulmonary neg shortness of breath, COPD, neg recent URI, former smoker,           Cardiovascular hypertension, (-) angina(-) Past MI and (-) Cardiac Stents (-) dysrhythmias (-) Valvular Problems/Murmurs     Neuro/Psych  Headaches, Seizures -,  PSYCHIATRIC DISORDERS Dementia    GI/Hepatic negative GI ROS, Neg liver ROS, neg GERD  ,  Endo/Other  negative endocrine ROS  Renal/GU negative Renal ROS     Musculoskeletal   Abdominal   Peds  Hematology negative hematology ROS (+)   Anesthesia Other Findings Past Medical History: 2019: Cancer ([place])     Comment:  prostate /radiation No date: Dementia ([place]) No date: Hypertension No date: Seizures ([place])     Comment:  LAST 6 MO  AGO  Past Surgical History: No date: none 12/15/2017: PROSTATE BIOPSY; N/A     Comment:  Procedure: BIOPSY TRANSRECTAL ULTRASONIC PROSTATE               (TUBP);  Surgeon: [name], MD;  Location: [facility]               ORS;  Service: Urology;  Laterality: N/A;     Reproductive/Obstetrics negative OB ROS                             Anesthesia Physical  Anesthesia Plan  ASA: 3  Anesthesia Plan: General   Post-op Pain Management:    Induction: Intravenous  PONV Risk Score and Plan: Ondansetron, Dexamethasone and Treatment may vary due to age or medical condition  Airway Management Planned: LMA  Additional Equipment:   Intra-op Plan:   Post-operative Plan: Extubation in OR  Informed Consent: I  have reviewed the patients History and Physical, chart, labs and discussed the procedure including the risks, benefits and alternatives for the proposed anesthesia with the patient or authorized representative who has indicated his/her understanding and acceptance.     Dental Advisory Given  Plan Discussed with: Anesthesiologist, CRNA and Surgeon  Anesthesia Plan Comments: (Phone consent from brother, Jenny Reichmann at  [phone]  Brother consented for risks of anesthesia including but not limited to:  - adverse reactions to medications - damage to teeth, lips or other oral mucosa - sore throat or hoarseness - Damage to heart, brain, lungs or loss of life   He voiced understanding.)        Anesthesia Quick Evaluation

## 2021-02-12 NOTE — Anesthesia Procedure Notes (Signed)
Procedure Name: LMA Insertion Date/Time: 02/12/2021 11:44 AM Performed by: Jerrye Noble, CRNA Pre-anesthesia Checklist: Emergency Drugs available, Suction available and Patient being monitored Patient Re-evaluated:Patient Re-evaluated prior to induction Oxygen Delivery Method: Circle system utilized Preoxygenation: Pre-oxygenation with 100% oxygen Induction Type: IV induction Ventilation: Mask ventilation without difficulty LMA: LMA inserted LMA Size: 4.5 Number of attempts: 1 Tube secured with: Tape Dental Injury: Teeth and Oropharynx as per pre-operative assessment

## 2021-02-12 NOTE — Transfer of Care (Signed)
Immediate Anesthesia Transfer of Care Note  Patient: STORY VANVRANKEN  Procedure(s) Performed: Right hand contracture release - Flexor release, pinning of PIP joints (Right: Hand)  Patient Location: PACU  Anesthesia Type:General  Level of Consciousness: drowsy and patient cooperative  Airway & Oxygen Therapy: Patient Spontanous Breathing and Patient connected to face mask oxygen  Post-op Assessment: Report given to RN and Post -op Vital signs reviewed and stable  Post vital signs: Reviewed and stable  Last Vitals:  Vitals Value Taken Time  BP 135/74 02/12/21 1247  Temp 36.2 C 02/12/21 1247  Pulse 64 02/12/21 1249  Resp 9 02/12/21 1249  SpO2 100 % 02/12/21 1249  Vitals shown include unvalidated device data.  Last Pain:  Vitals:   02/12/21 0926  TempSrc: Oral         Complications: No notable events documented.

## 2021-02-12 NOTE — Op Note (Signed)
02/12/2021  12:45 PM  PATIENT:  [name]  75 y.o. male  PRE-OPERATIVE DIAGNOSIS:  Contracture of joint of right hand  M24.541  POST-OPERATIVE DIAGNOSIS:  Contracture of joint of right hand  M24.541  PROCEDURE:  Procedure(s): Right hand contracture release - Flexor release, pinning of PIP joints (Right)  SURGEON: Laurene Footman, MD  ASSISTANTS: None  ANESTHESIA:   general  EBL:  Total I/O In: 200 [IV Piggyback:200] Out: 10 [Blood:10]  BLOOD ADMINISTERED:none  DRAINS: none   LOCAL MEDICATIONS USED:  MARCAINE     SPECIMEN:  No Specimen  DISPOSITION OF SPECIMEN:  N/A  COUNTS:  YES  TOURNIQUET:   Total Tourniquet Time Documented: Upper Arm (Right) - 15 minutes Total: Upper Arm (Right) - 15 minutes   IMPLANTS: K wires x3  DICTATION: .Dragon Dictation patient was brought to the operating room and after adequate anesthesia was obtained the hand was prepped and draped in the usual sterile manner first scrubbing the hand and using fingernail cleaner as the was poor hygiene.  The tourniquet was applied to the upper arm and contracture of the little ring and middle fingers was significant and fixed and it did not appear within the sleep to involve the flexor tendons.  The appropriate patient identification timeout procedures were completed the tourniquet was raised and the long finger was approached first with a Alyse Low type incision over the PIP joint subcutaneous tissue spread and the pulley incised so that the tendons could be pulled out of the way and the volar aspect of the joint capsule could be identified was punctured multiple times with scissors and then the finger brought into full extension at the PIP joint.  There is still some soft tissue tightness from skin and adjacent tissues but full extension was possible.  Identical procedure carried out on the ring and little fingers with a little finger maximum extension of 30 degrees of flexion.  The wounds were then  irrigated and closed with simple erupted nylon suture the finger joints were then pinned with about 20 degrees flexion of each the PIP joint of the little finger at the start of the case was somewhat subluxed and with full extension it was subluxed as well and pinned in about again 30 degrees flexion.  5 cc of half percent Sensorcaine plain was infiltrated the base of each finger for postop analgesia.  The hand was then dressed with Xeroform 4 x 4 fluffs to keep the fingers apart after pins have been cut short and then a volar splint keeping the MCP joints and wrist also in extension.  There was good cap refill to all fingers at the close of the case the index finger could be brought out to near full extension and so was no release was carried out.  This patient was sent to recovery stable condition.  PLAN OF CARE: Discharge to home after PACU  PATIENT DISPOSITION:  PACU - hemodynamically stable.

## 2021-02-13 ENCOUNTER — Encounter: Payer: Self-pay | Admitting: Orthopedic Surgery

## 2021-02-13 NOTE — Anesthesia Postprocedure Evaluation (Signed)
Anesthesia Post Note  Patient: Jeremy [name]  Procedure(s) Performed: Right hand contracture release - Flexor release, pinning of PIP joints (Right: Hand)  Patient location during evaluation: PACU Anesthesia Type: General Level of consciousness: awake and alert Pain management: pain level controlled Vital Signs Assessment: post-procedure vital signs reviewed and stable Respiratory status: spontaneous breathing, nonlabored ventilation, respiratory function stable and patient connected to nasal cannula oxygen Cardiovascular status: blood pressure returned to baseline and stable Postop Assessment: no apparent nausea or vomiting Anesthetic complications: no   No notable events documented.   Last Vitals:  Vitals:   02/12/21 1323 02/12/21 1338  BP: (!) 160/84 (!) 161/72  Pulse: 74 75  Resp: 12 17  Temp: 36.7 C (!) 36.3 C  SpO2: 95% 97%    Last Pain:  Vitals:   02/12/21 1338  TempSrc: Temporal  PainSc: 0-No pain                 Martha Clan

## 2021-02-20 DIAGNOSIS — E538 Deficiency of other specified B group vitamins: Secondary | ICD-10-CM | POA: Diagnosis not present

## 2021-02-26 DIAGNOSIS — M4004 Postural kyphosis, thoracic region: Secondary | ICD-10-CM | POA: Diagnosis not present

## 2021-02-26 DIAGNOSIS — M24541 Contracture, right hand: Secondary | ICD-10-CM | POA: Diagnosis not present

## 2021-03-04 ENCOUNTER — Ambulatory Visit: Payer: Medicare HMO | Admitting: Occupational Therapy

## 2021-03-08 ENCOUNTER — Ambulatory Visit: Payer: Medicare HMO | Admitting: Occupational Therapy

## 2021-03-14 ENCOUNTER — Ambulatory Visit: Payer: Medicare HMO | Attending: Orthopedic Surgery | Admitting: Occupational Therapy

## 2021-03-14 ENCOUNTER — Other Ambulatory Visit: Payer: Self-pay

## 2021-03-14 ENCOUNTER — Encounter: Payer: Self-pay | Admitting: Occupational Therapy

## 2021-03-14 DIAGNOSIS — M25641 Stiffness of right hand, not elsewhere classified: Secondary | ICD-10-CM | POA: Insufficient documentation

## 2021-03-14 DIAGNOSIS — M62449 Contracture of muscle, unspecified hand: Secondary | ICD-10-CM

## 2021-03-14 NOTE — Therapy (Addendum)
[facility] [address] [place], [address] Phone: [phone]   Fax:  [phone]  Occupational Therapy Evaluation  Patient Details  Name: Jeremy [name] MRN: [id number] Date of Birth: 1945/04/03 Referring Provider (OT): Dr Rudene Christians   Encounter Date: 03/14/2021   OT End of Session - 03/14/21 1204     Visit Number 1    Number of Visits 4    Date for OT Re-Evaluation 05/30/21    OT Start Time 0947    OT Stop Time 1040    OT Time Calculation (min) 53 min    Activity Tolerance Patient tolerated treatment well    Behavior During Therapy [facility] for tasks assessed/performed             Past Medical History:  Diagnosis Date   Cancer ([place]) 2019   prostate /radiation   Dementia ([place])    Hypertension    Seizures ([place])    last seizure 2020    Past Surgical History:  Procedure Laterality Date   FLEXOR TENDON REPAIR Right 02/12/2021   Procedure: Right hand contracture release - Flexor release, pinning of PIP joints;  Surgeon: Hessie Knows, MD;  Location: [facility] ORS;  Service: Orthopedics;  Laterality: Right;   PROSTATE BIOPSY N/A 12/15/2017   Procedure: BIOPSY TRANSRECTAL ULTRASONIC PROSTATE (TUBP);  Surgeon: [name], MD;  Location: [facility] ORS;  Service: Urology;  Laterality: N/A;   ROBOT ASSISTED INGUINAL HERNIA REPAIR Right 09/02/2018   Procedure: ROBOT ASSISTED INGUINAL HERNIA REPAIR AND UMBILICAL HERNIA, RIGHT;  Surgeon: Jules Husbands, MD;  Location: [facility] ORS;  Service: General;  Laterality: Right;    There were no vitals filed for this visit.   Subjective Assessment - 03/14/21 1154     Subjective  Per care taker pt removed his soft cast or splint about 2 days ago -cannot find it - pt denies any pain and ask if he can write with that hand- he feeds himself and do upper body ADL's with R hand    Pertinent History Seen on 02/28/21 by PA at [place] office - status post right hand flexion  contracture release with Dr. Hessie Knows on 02/12/2021. Patient doing well. Pain well controlled. He is in a splint keeping the digits in extension. He has 3 pins placed throughout the third fourth and fifth digits of the right hand. No warmth redness or drainage. Patient doing well today with no complaints.Sutures removed, Steri-Strips applied. X-rays obtained show no retention of pins. All pins have been removed. Patient placed back into extension splint and will be referred to occupational therapy for resting extension splint.    Patient Stated Goals Want to wrist with R hand and feed himself - keep it cleaner easier    Currently in Pain? No/denies               [facility] OT Assessment - 03/14/21 0001       Assessment   Medical Diagnosis R hand flexor contractures    Referring Provider (OT) Dr Rudene Christians    Hand Dominance Right      Precautions   Required Braces or Orthoses --   Resting hand splint     Balance Screen   Has the patient fallen in the past 6 months No      Home  Environment   Lives With Other (Comment)   Family care home     Prior Function   Vocation Retired    Leisure read  newspaper, write, watch tv, play some board games      Right Hand AROM   R Index  MCP 0-90 90 Degrees    R Index PIP 0-100 -30 Degrees    R Long  MCP 0-90 90 Degrees    R Long PIP 0-100 -75 Degrees    R Ring  MCP 0-90 90 Degrees    R Ring PIP 0-100 -75 Degrees    R Little  MCP 0-90 90 Degrees    R Little PIP 0-100 -75 Degrees      Right Hand PROM   R Index  MCP 0-90 0 Degrees    R Index PIP 0-100 -30 Degrees    R Long  MCP 0-90 0 Degrees    R Long PIP 0-100 -60 Degrees    R Ring  MCP 0-90 0 Degrees    R Ring PIP 0-100 -60 Degrees    R Little  MCP 0-90 0 Degrees    R Little PIP 0-100 -65 Degrees                              OT Education - 03/14/21 1203     Education Details PROM HEP and splint wearing    Person(s) Educated Patient;Caregiver(s)    Methods  Explanation;Demonstration;Tactile cues;Verbal cues;Handout    Comprehension Verbal cues required;Returned demonstration;Verbalized understanding                 OT Long Term Goals - 03/14/21 1216       OT LONG TERM GOAL #1   Title Pt and caregivers to be ind in donning, doffing of splint and wearing schedule to maintain extention in digits from surgery    Baseline prior to surgery -90 PIP's - and coming in today -75 - in session was able to get -60 to -65 degrees    Time 8    Period Weeks    Status New    Target Date 05/09/21      OT LONG TERM GOAL #2   Title Pt to tolerate fitting and wearing of prefab resting  comfy hand splint without redness or pain to maintain extention from surgery    Baseline fabricated custom polyform forearm base resting hand splint -pt to wear and increase wearing time-but order prefab that will be lighter and softer for his hx of seizures    Time 12    Period Weeks    Status New    Target Date 05/30/21                   Plan - 03/14/21 1207     Clinical Impression Statement Pt had 90 degrees  Flexor contractures in R dominant hand - had 02/12/21 surgery by DR Rudene Christians to release flexor tendons and pinning of 3rd thru 5th digits - pins were removed on 02/28/21 and refer to OT for splinting - pt this date arrive with  PIP's digits at -75 degrees  3rd thru 5th but keeping them in more flexion that that. PROM for PIP's in session after heat were -60 to -65 degrees. Did fabricate pt resting hand splint this date -tolerated well and caretaker education done for ROM HEP -and increasing wearing time of splint over the next week starting 2 x day hour now to 2 x 4hrs - if no issues -can change to night time to increase use during day. With if needed at that time 2 hrs  wearing middle of day if extensors to weak.  Would recommend to order him prefab comfy resting hand splint that is softer, lighter for pt to wear long term and can be adjusted by OT  to increase  extention if indicated. Pt can benefit from cont skilled OT services for splint wearing , decrease risk for flexor contractures to return and increase use of his R dominant hand in light ADL's.    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   Modification or Assistance to Complete Evaluation  No modification of tasks or assist necessary to complete eval    OT Frequency --   3 visits   OT Duration 12 weeks    OT Treatment/Interventions Self-care/ADL training;Moist Heat;Splinting;Therapeutic exercise;Scar mobilization;Passive range of motion;Patient/family education    Consulted and Agree with Plan of Care Patient             Patient will benefit from skilled therapeutic intervention in order to improve the following deficits and impairments:   Body Structure / Function / Physical Skills: ADL, Dexterity, UE functional use, IADL, ROM, Flexibility, Scar mobility       Visit Diagnosis: Stiffness of  right hand, not elsewhere classified. 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 [place], [address] Phone: [phone]   Fax:  [phone]  Name: DALONTE HARDAGE MRN: [id number] Date of Birth: 05-May-1945

## 2021-03-18 DIAGNOSIS — R2681 Unsteadiness on feet: Secondary | ICD-10-CM | POA: Diagnosis not present

## 2021-03-18 DIAGNOSIS — R569 Unspecified convulsions: Secondary | ICD-10-CM | POA: Diagnosis not present

## 2021-03-20 DIAGNOSIS — I700 Atherosclerosis of aorta: Secondary | ICD-10-CM | POA: Diagnosis not present

## 2021-03-22 DIAGNOSIS — E785 Hyperlipidemia, unspecified: Secondary | ICD-10-CM | POA: Diagnosis not present

## 2021-03-22 DIAGNOSIS — F0390 Unspecified dementia without behavioral disturbance: Secondary | ICD-10-CM | POA: Diagnosis not present

## 2021-03-22 DIAGNOSIS — D509 Iron deficiency anemia, unspecified: Secondary | ICD-10-CM | POA: Diagnosis not present

## 2021-03-22 DIAGNOSIS — I10 Essential (primary) hypertension: Secondary | ICD-10-CM | POA: Diagnosis not present

## 2021-03-22 DIAGNOSIS — Z79899 Other long term (current) drug therapy: Secondary | ICD-10-CM | POA: Diagnosis not present

## 2021-03-22 DIAGNOSIS — R569 Unspecified convulsions: Secondary | ICD-10-CM | POA: Diagnosis not present

## 2021-03-22 DIAGNOSIS — Z0000 Encounter for general adult medical examination without abnormal findings: Secondary | ICD-10-CM | POA: Diagnosis not present

## 2021-03-22 DIAGNOSIS — E538 Deficiency of other specified B group vitamins: Secondary | ICD-10-CM | POA: Diagnosis not present

## 2021-03-27 DIAGNOSIS — E538 Deficiency of other specified B group vitamins: Secondary | ICD-10-CM | POA: Diagnosis not present

## 2021-04-11 NOTE — Addendum Note (Signed)
Addended by: Rosalyn Gess on: 04/11/2021 03:48 PM   Modules accepted: Orders

## 2021-04-22 DIAGNOSIS — M24541 Contracture, right hand: Secondary | ICD-10-CM | POA: Diagnosis not present

## 2021-07-01 DIAGNOSIS — E538 Deficiency of other specified B group vitamins: Secondary | ICD-10-CM | POA: Diagnosis not present

## 2021-08-08 DIAGNOSIS — Z79899 Other long term (current) drug therapy: Secondary | ICD-10-CM | POA: Diagnosis not present

## 2021-08-08 DIAGNOSIS — E538 Deficiency of other specified B group vitamins: Secondary | ICD-10-CM | POA: Diagnosis not present

## 2021-08-08 DIAGNOSIS — I10 Essential (primary) hypertension: Secondary | ICD-10-CM | POA: Diagnosis not present

## 2021-08-08 DIAGNOSIS — E7800 Pure hypercholesterolemia, unspecified: Secondary | ICD-10-CM | POA: Diagnosis not present

## 2021-08-08 DIAGNOSIS — R569 Unspecified convulsions: Secondary | ICD-10-CM | POA: Diagnosis not present

## 2021-08-08 DIAGNOSIS — F0390 Unspecified dementia without behavioral disturbance: Secondary | ICD-10-CM | POA: Diagnosis not present

## 2021-08-08 DIAGNOSIS — Z0000 Encounter for general adult medical examination without abnormal findings: Secondary | ICD-10-CM | POA: Diagnosis not present

## 2021-08-08 DIAGNOSIS — D519 Vitamin B12 deficiency anemia, unspecified: Secondary | ICD-10-CM | POA: Diagnosis not present

## 2021-09-11 DIAGNOSIS — E538 Deficiency of other specified B group vitamins: Secondary | ICD-10-CM | POA: Diagnosis not present

## 2021-09-13 DIAGNOSIS — R32 Unspecified urinary incontinence: Secondary | ICD-10-CM | POA: Diagnosis not present

## 2021-10-17 DIAGNOSIS — R32 Unspecified urinary incontinence: Secondary | ICD-10-CM | POA: Diagnosis not present

## 2021-11-05 DIAGNOSIS — D513 Other dietary vitamin B12 deficiency anemia: Secondary | ICD-10-CM | POA: Diagnosis not present

## 2021-11-05 DIAGNOSIS — E538 Deficiency of other specified B group vitamins: Secondary | ICD-10-CM | POA: Diagnosis not present

## 2021-11-15 DIAGNOSIS — R32 Unspecified urinary incontinence: Secondary | ICD-10-CM | POA: Diagnosis not present

## 2021-12-10 DIAGNOSIS — D513 Other dietary vitamin B12 deficiency anemia: Secondary | ICD-10-CM | POA: Diagnosis not present

## 2021-12-10 DIAGNOSIS — I10 Essential (primary) hypertension: Secondary | ICD-10-CM | POA: Diagnosis not present

## 2021-12-10 DIAGNOSIS — F0390 Unspecified dementia without behavioral disturbance: Secondary | ICD-10-CM | POA: Diagnosis not present

## 2021-12-10 DIAGNOSIS — R32 Unspecified urinary incontinence: Secondary | ICD-10-CM | POA: Diagnosis not present

## 2021-12-10 DIAGNOSIS — Z79899 Other long term (current) drug therapy: Secondary | ICD-10-CM | POA: Diagnosis not present

## 2021-12-10 DIAGNOSIS — R569 Unspecified convulsions: Secondary | ICD-10-CM | POA: Diagnosis not present

## 2021-12-10 DIAGNOSIS — E7800 Pure hypercholesterolemia, unspecified: Secondary | ICD-10-CM | POA: Diagnosis not present

## 2021-12-10 DIAGNOSIS — G40909 Epilepsy, unspecified, not intractable, without status epilepticus: Secondary | ICD-10-CM | POA: Diagnosis not present

## 2022-01-17 ENCOUNTER — Other Ambulatory Visit: Payer: Self-pay | Admitting: Family Medicine

## 2022-01-17 DIAGNOSIS — C61 Malignant neoplasm of prostate: Secondary | ICD-10-CM

## 2022-01-20 ENCOUNTER — Other Ambulatory Visit: Payer: Medicare HMO

## 2022-01-20 DIAGNOSIS — C61 Malignant neoplasm of prostate: Secondary | ICD-10-CM

## 2022-01-21 LAB — PSA: Prostate Specific Ag, Serum: <0.1 ng/mL (ref 0.0–4.0)

## 2022-01-23 ENCOUNTER — Ambulatory Visit (INDEPENDENT_AMBULATORY_CARE_PROVIDER_SITE_OTHER): Payer: Medicare HMO | Admitting: Urology

## 2022-01-23 ENCOUNTER — Encounter: Payer: Self-pay | Admitting: Urology

## 2022-01-23 VITALS — BP 159/90 | HR 97 | Ht 72.0 in | Wt 160.0 lb

## 2022-01-23 DIAGNOSIS — C61 Malignant neoplasm of prostate: Secondary | ICD-10-CM

## 2022-01-23 NOTE — Progress Notes (Signed)
01/23/2022 10:47 AM   Jeremy Jeremy [name] 05/31/1945 [id number]  Referring provider: Idelle Crouch, MD [facility] [place],  [place] [address]  Chief Complaint  Patient presents with   Prostate Cancer    Urologic history: 1. cT1c high risk prostate cancer  - diagnosed October 2019; PSA 11; 38 cc  -Multiple cores positive Gleason Jeremy+4, 4+Jeremy and 4+4 -Treated IMRT + ADT completed 5/20 -Initial leuprolide injection Jeremy/2020; completed ADT 10/2020  HPI: 76 y.o. Jeremy [name] presents for follow-up of prostate cancer.  He presents today with his caregiver  Completed 2 years ADT 11/12/2020 No complaints since last years visit No bothersome LUTS PSA 01/20/2022 undetectable; <0.01   PMH: Past Medical History:  Diagnosis Date   Cancer ([place]) 2019   prostate /radiation   Dementia ([place])    Hypertension    Seizures ([place])    last seizure 2020    Surgical History: Past Surgical History:  Procedure Laterality Date   FLEXOR TENDON REPAIR Right 02/12/2021   Procedure: Right hand contracture release - Flexor release, pinning of PIP joints;  Surgeon: Hessie Knows, MD;  Location: [facility] ORS;  Service: Orthopedics;  Laterality: Right;   PROSTATE BIOPSY N/A 12/15/2017   Procedure: BIOPSY TRANSRECTAL ULTRASONIC PROSTATE (TUBP);  Surgeon: [name], MD;  Location: [facility] ORS;  Service: Urology;  Laterality: N/A;   ROBOT ASSISTED INGUINAL HERNIA REPAIR Right 09/02/2018   Procedure: ROBOT ASSISTED INGUINAL HERNIA REPAIR AND UMBILICAL HERNIA, RIGHT;  Surgeon: Jules Husbands, MD;  Location: [facility] ORS;  Service: General;  Laterality: Right;    Home Medications:  Allergies as of 01/23/2022   No Known Allergies      Medication List        Accurate as of January 23, 2022 10:47 AM. If you have any questions, ask your nurse or doctor.          acetaminophen 325 MG tablet Commonly known as: TYLENOL Take 650 mg by mouth every 4 (four) hours as needed for moderate  pain, fever or headache.   alum & mag hydroxide-simeth 200-200-20 MG/5ML suspension Commonly known as: MAALOX/MYLANTA Take 30 mLs by mouth daily as needed for indigestion or heartburn.   Calcium + Vitamin D3 600-10 MG-MCG Tabs Generic drug: Calcium Carb-Cholecalciferol TAKE 1 TABLET BY MOUTH 2 TIMES DAILY   cyanocobalamin 1000 MCG/ML injection Commonly known as: VITAMIN B12 Inject 1,000 mcg into the muscle every 30 (thirty) days.   guaiFENesin 100 MG/5ML liquid Commonly known as: ROBITUSSIN Take 200 mg by mouth every 6 (six) hours as needed for cough.   HYDROcodone-acetaminophen 5-325 MG tablet Commonly known as: Norco Take 1-2 tablets by mouth every 6 (six) hours as needed for moderate pain.   lamoTRIgine 25 MG tablet Commonly known as: LAMICTAL Take 25 mg by mouth 2 (two) times daily.   levETIRAcetam 500 MG tablet Commonly known as: KEPPRA Take 500 mg by mouth 2 (two) times daily.   loperamide 2 MG capsule Commonly known as: IMODIUM Take 2 mg by mouth 4 (four) times daily as needed for diarrhea or loose stools.   magnesium hydroxide 400 MG/5ML suspension Commonly known as: MILK OF MAGNESIA Take 30 mLs by mouth daily as needed for mild constipation.   risperiDONE 2 MG tablet Commonly known as: RISPERDAL Take 1 mg by mouth Jeremy (three) times daily.   rosuvastatin 10 MG tablet Commonly known as: CRESTOR Take 10 mg by mouth daily.   tamsulosin 0.4 MG Caps capsule Commonly known as:  FLOMAX Take 0.4 mg by mouth daily after supper.        Allergies: No Known Allergies  Family History: No family history on file.  Social History:  reports that he has quit smoking. His smoking use included cigarettes. He has never used smokeless tobacco. He reports that he does not currently use alcohol. He reports that he does not currently use drugs after having used the following drugs: Cocaine and IV.   Physical Exam: BP (!) 159/90   Pulse 97   Ht 6' (1.829 m)   Wt 160 lb  (72.6 kg)   BMI 21.70 kg/m   Constitutional:  Alert, No acute distress. HEENT: [place] AT, moist mucus membranes.  Trachea midline, no masses. Cardiovascular: No clubbing, cyanosis, or edema. Respiratory: Normal respiratory effort, no increased work of breathing.    Assessment & Plan:    1.  T1c high risk prostate cancer Status post IMRT + ADT Completed ADT 10/2020 PSA undetectable Lab visit 6 months for PSA and 1 year office visit with [place], [place] [address] [place], [place] [address] [phone]

## 2022-01-27 DIAGNOSIS — E538 Deficiency of other specified B group vitamins: Secondary | ICD-10-CM | POA: Diagnosis not present

## 2022-02-04 ENCOUNTER — Emergency Department: Payer: Medicare HMO

## 2022-02-04 ENCOUNTER — Observation Stay: Payer: Medicare HMO

## 2022-02-04 ENCOUNTER — Other Ambulatory Visit: Payer: Self-pay

## 2022-02-04 ENCOUNTER — Inpatient Hospital Stay
Admission: EM | Admit: 2022-02-04 | Discharge: 2022-02-14 | DRG: 280 | Disposition: A | Discharge status: Skilled Nursing Facility | Payer: Medicare HMO | Source: Skilled Nursing Facility | Attending: Internal Medicine | Admitting: Internal Medicine

## 2022-02-04 DIAGNOSIS — R7989 Other specified abnormal findings of blood chemistry: Secondary | ICD-10-CM | POA: Diagnosis not present

## 2022-02-04 DIAGNOSIS — R69 Illness, unspecified: Secondary | ICD-10-CM | POA: Diagnosis not present

## 2022-02-04 DIAGNOSIS — G40909 Epilepsy, unspecified, not intractable, without status epilepticus: Secondary | ICD-10-CM | POA: Diagnosis present

## 2022-02-04 DIAGNOSIS — I10 Essential (primary) hypertension: Secondary | ICD-10-CM | POA: Diagnosis not present

## 2022-02-04 DIAGNOSIS — R319 Hematuria, unspecified: Secondary | ICD-10-CM | POA: Diagnosis present

## 2022-02-04 DIAGNOSIS — E860 Dehydration: Secondary | ICD-10-CM | POA: Diagnosis not present

## 2022-02-04 DIAGNOSIS — E611 Iron deficiency: Secondary | ICD-10-CM | POA: Diagnosis present

## 2022-02-04 DIAGNOSIS — R531 Weakness: Secondary | ICD-10-CM | POA: Diagnosis not present

## 2022-02-04 DIAGNOSIS — Z602 Problems related to living alone: Secondary | ICD-10-CM | POA: Diagnosis present

## 2022-02-04 DIAGNOSIS — E785 Hyperlipidemia, unspecified: Secondary | ICD-10-CM | POA: Diagnosis present

## 2022-02-04 DIAGNOSIS — R569 Unspecified convulsions: Secondary | ICD-10-CM | POA: Diagnosis not present

## 2022-02-04 DIAGNOSIS — G9340 Encephalopathy, unspecified: Secondary | ICD-10-CM | POA: Diagnosis present

## 2022-02-04 DIAGNOSIS — I498 Other specified cardiac arrhythmias: Secondary | ICD-10-CM

## 2022-02-04 DIAGNOSIS — F0390 Unspecified dementia without behavioral disturbance: Secondary | ICD-10-CM | POA: Diagnosis not present

## 2022-02-04 DIAGNOSIS — N1832 Chronic kidney disease, stage 3b: Secondary | ICD-10-CM

## 2022-02-04 DIAGNOSIS — I214 Non-ST elevation (NSTEMI) myocardial infarction: Secondary | ICD-10-CM | POA: Diagnosis not present

## 2022-02-04 DIAGNOSIS — M6281 Muscle weakness (generalized): Secondary | ICD-10-CM | POA: Diagnosis not present

## 2022-02-04 DIAGNOSIS — Z8546 Personal history of malignant neoplasm of prostate: Secondary | ICD-10-CM

## 2022-02-04 DIAGNOSIS — R000 Tachycardia, unspecified: Secondary | ICD-10-CM | POA: Diagnosis not present

## 2022-02-04 DIAGNOSIS — I130 Hypertensive heart and chronic kidney disease with heart failure and stage 1 through stage 4 chronic kidney disease, or unspecified chronic kidney disease: Secondary | ICD-10-CM | POA: Diagnosis present

## 2022-02-04 DIAGNOSIS — G9341 Metabolic encephalopathy: Secondary | ICD-10-CM | POA: Diagnosis not present

## 2022-02-04 DIAGNOSIS — R5381 Other malaise: Secondary | ICD-10-CM | POA: Diagnosis not present

## 2022-02-04 DIAGNOSIS — Z87891 Personal history of nicotine dependence: Secondary | ICD-10-CM | POA: Diagnosis not present

## 2022-02-04 DIAGNOSIS — G319 Degenerative disease of nervous system, unspecified: Secondary | ICD-10-CM | POA: Diagnosis not present

## 2022-02-04 DIAGNOSIS — R008 Other abnormalities of heart beat: Secondary | ICD-10-CM | POA: Diagnosis present

## 2022-02-04 DIAGNOSIS — J811 Chronic pulmonary edema: Secondary | ICD-10-CM | POA: Diagnosis not present

## 2022-02-04 DIAGNOSIS — M6282 Rhabdomyolysis: Secondary | ICD-10-CM | POA: Diagnosis not present

## 2022-02-04 DIAGNOSIS — R4182 Altered mental status, unspecified: Secondary | ICD-10-CM | POA: Diagnosis not present

## 2022-02-04 DIAGNOSIS — U071 COVID-19: Secondary | ICD-10-CM | POA: Diagnosis not present

## 2022-02-04 DIAGNOSIS — Z923 Personal history of irradiation: Secondary | ICD-10-CM

## 2022-02-04 DIAGNOSIS — Z79899 Other long term (current) drug therapy: Secondary | ICD-10-CM

## 2022-02-04 DIAGNOSIS — I63532 Cerebral infarction due to unspecified occlusion or stenosis of left posterior cerebral artery: Secondary | ICD-10-CM | POA: Diagnosis not present

## 2022-02-04 DIAGNOSIS — I5042 Chronic combined systolic (congestive) and diastolic (congestive) heart failure: Secondary | ICD-10-CM | POA: Diagnosis not present

## 2022-02-04 DIAGNOSIS — R32 Unspecified urinary incontinence: Secondary | ICD-10-CM | POA: Diagnosis not present

## 2022-02-04 DIAGNOSIS — R824 Acetonuria: Secondary | ICD-10-CM | POA: Diagnosis present

## 2022-02-04 DIAGNOSIS — Z751 Person awaiting admission to adequate facility elsewhere: Secondary | ICD-10-CM | POA: Diagnosis not present

## 2022-02-04 DIAGNOSIS — R509 Fever, unspecified: Secondary | ICD-10-CM | POA: Diagnosis not present

## 2022-02-04 DIAGNOSIS — R5383 Other fatigue: Secondary | ICD-10-CM | POA: Diagnosis not present

## 2022-02-04 DIAGNOSIS — Z743 Need for continuous supervision: Secondary | ICD-10-CM | POA: Diagnosis not present

## 2022-02-04 LAB — CBC WITH DIFFERENTIAL/PLATELET
Abs Immature Granulocytes: 0.02 10*3/uL (ref 0.00–0.07)
Basophils Absolute: 0 10*3/uL (ref 0.0–0.1)
Basophils Relative: 0 %
Eosinophils Absolute: 0 10*3/uL (ref 0.0–0.5)
Eosinophils Relative: 0 %
HCT: 32.7 % — ABNORMAL LOW (ref 39.0–52.0)
Hemoglobin: 10.8 g/dL — ABNORMAL LOW (ref 13.0–17.0)
Immature Granulocytes: 0 %
Lymphocytes Relative: 9 %
Lymphs Abs: 0.6 10*3/uL — ABNORMAL LOW (ref 0.7–4.0)
MCH: 28.4 pg (ref 26.0–34.0)
MCHC: 33 g/dL (ref 30.0–36.0)
MCV: 86.1 fL (ref 80.0–100.0)
Monocytes Absolute: 0.9 10*3/uL (ref 0.1–1.0)
Monocytes Relative: 14 %
Neutro Abs: 4.8 10*3/uL (ref 1.7–7.7)
Neutrophils Relative %: 77 %
Platelets: 168 10*3/uL (ref 150–400)
RBC: 3.8 MIL/uL — ABNORMAL LOW (ref 4.22–5.81)
RDW: 16.2 % — ABNORMAL HIGH (ref 11.5–15.5)
WBC: 6.3 10*3/uL (ref 4.0–10.5)
nRBC: 0 % (ref 0.0–0.2)

## 2022-02-04 LAB — URINALYSIS, ROUTINE W REFLEX MICROSCOPIC
Bacteria, UA: NONE SEEN
Bilirubin Urine: NEGATIVE
Glucose, UA: NEGATIVE mg/dL
Ketones, ur: 5 mg/dL — AB
Leukocytes,Ua: NEGATIVE
Nitrite: NEGATIVE
Protein, ur: >=300 mg/dL — AB
Specific Gravity, Urine: 1.044 — ABNORMAL HIGH (ref 1.005–1.030)
pH: 5 (ref 5.0–8.0)

## 2022-02-04 LAB — COMPREHENSIVE METABOLIC PANEL
ALT: 21 U/L (ref 0–44)
AST: 86 U/L — ABNORMAL HIGH (ref 15–41)
Albumin: 3.7 g/dL (ref 3.5–5.0)
Alkaline Phosphatase: 52 U/L (ref 38–126)
Anion gap: 9 (ref 5–15)
BUN: 16 mg/dL (ref 8–23)
CO2: 22 mmol/L (ref 22–32)
Calcium: 9.5 mg/dL (ref 8.9–10.3)
Chloride: 110 mmol/L (ref 98–111)
Creatinine, Ser: 1.98 mg/dL — ABNORMAL HIGH (ref 0.61–1.24)
GFR, Estimated: 35 mL/min — ABNORMAL LOW (ref >60)
Glucose, Bld: 118 mg/dL — ABNORMAL HIGH (ref 70–99)
Potassium: 3.9 mmol/L (ref 3.5–5.1)
Sodium: 141 mmol/L (ref 135–145)
Total Bilirubin: 0.7 mg/dL (ref 0.3–1.2)
Total Protein: 7.2 g/dL (ref 6.5–8.1)

## 2022-02-04 LAB — RESP PANEL BY RT-PCR (RSV, FLU A&B, COVID)  RVPGX2
Influenza A by PCR: NEGATIVE
Influenza B by PCR: NEGATIVE
Resp Syncytial Virus by PCR: NEGATIVE
SARS Coronavirus 2 by RT PCR: POSITIVE — AB

## 2022-02-04 LAB — BRAIN NATRIURETIC PEPTIDE: B Natriuretic Peptide: 358.6 pg/mL — ABNORMAL HIGH (ref 0.0–100.0)

## 2022-02-04 LAB — TROPONIN I (HIGH SENSITIVITY)
Troponin I (High Sensitivity): 1216 ng/L (ref <18)
Troponin I (High Sensitivity): 861 ng/L (ref <18)

## 2022-02-04 LAB — TSH: TSH: 0.921 u[IU]/mL (ref 0.350–4.500)

## 2022-02-04 LAB — CK: Total CK: 8343 U/L — ABNORMAL HIGH (ref 49–397)

## 2022-02-04 LAB — MAGNESIUM: Magnesium: 2 mg/dL (ref 1.7–2.4)

## 2022-02-04 LAB — AMMONIA: Ammonia: 20 umol/L (ref 9–35)

## 2022-02-04 MED ORDER — TAMSULOSIN HCL 0.4 MG PO CAPS
0.4000 mg | ORAL_CAPSULE | Freq: Every day | ORAL | Status: DC
  Administered 2022-02-05 – 2022-02-13 (×10): 0.4 mg via ORAL
  Filled 2022-02-04 (×10): qty 1

## 2022-02-04 MED ORDER — ENOXAPARIN SODIUM 40 MG/0.4ML IJ SOSY
40.0000 mg | PREFILLED_SYRINGE | INTRAMUSCULAR | Status: DC
  Administered 2022-02-05 – 2022-02-13 (×10): 40 mg via SUBCUTANEOUS
  Filled 2022-02-04 (×10): qty 0.4

## 2022-02-04 MED ORDER — THIAMINE MONONITRATE 100 MG PO TABS
100.0000 mg | ORAL_TABLET | Freq: Every day | ORAL | Status: DC
  Administered 2022-02-05 – 2022-02-14 (×10): 100 mg via ORAL
  Filled 2022-02-04 (×10): qty 1

## 2022-02-04 MED ORDER — ONDANSETRON HCL 4 MG PO TABS: 4.0000 mg | ORAL_TABLET | Freq: Four times a day (QID) | ORAL | Status: DC | PRN

## 2022-02-04 MED ORDER — IOHEXOL 350 MG/ML SOLN
80.0000 mL | Freq: Once | INTRAVENOUS | Status: AC | PRN
  Administered 2022-02-04: 80 mL via INTRAVENOUS

## 2022-02-04 MED ORDER — POLYETHYLENE GLYCOL 3350 17 G PO PACK: 17.0000 g | PACK | Freq: Every day | ORAL | Status: DC | PRN

## 2022-02-04 MED ORDER — ONDANSETRON HCL 4 MG/2ML IJ SOLN: 4.0000 mg | Freq: Four times a day (QID) | INTRAMUSCULAR | Status: DC | PRN

## 2022-02-04 MED ORDER — ADULT MULTIVITAMIN W/MINERALS CH
1.0000 | ORAL_TABLET | Freq: Every day | ORAL | Status: DC
  Administered 2022-02-05 – 2022-02-14 (×10): 1 via ORAL
  Filled 2022-02-04 (×10): qty 1

## 2022-02-04 MED ORDER — LEVETIRACETAM 500 MG PO TABS
500.0000 mg | ORAL_TABLET | Freq: Two times a day (BID) | ORAL | Status: DC
  Administered 2022-02-05 – 2022-02-14 (×20): 500 mg via ORAL
  Filled 2022-02-04 (×20): qty 1

## 2022-02-04 MED ORDER — ACETAMINOPHEN 325 MG PO TABS
650.0000 mg | ORAL_TABLET | Freq: Four times a day (QID) | ORAL | Status: DC | PRN
  Administered 2022-02-09: 650 mg via ORAL
  Filled 2022-02-04: qty 2

## 2022-02-04 MED ORDER — LACTATED RINGERS IV SOLN: INTRAVENOUS | Status: DC

## 2022-02-04 MED ORDER — SODIUM CHLORIDE 0.9% FLUSH
3.0000 mL | Freq: Two times a day (BID) | INTRAVENOUS | Status: DC
  Administered 2022-02-05 – 2022-02-14 (×15): 3 mL via INTRAVENOUS

## 2022-02-04 MED ORDER — LACTATED RINGERS IV BOLUS
1000.0000 mL | Freq: Once | INTRAVENOUS | Status: AC
  Administered 2022-02-05: 1000 mL via INTRAVENOUS

## 2022-02-04 MED ORDER — ACETAMINOPHEN 650 MG RE SUPP: 650.0000 mg | Freq: Four times a day (QID) | RECTAL | Status: DC | PRN

## 2022-02-04 MED ORDER — FOLIC ACID 1 MG PO TABS
1.0000 mg | ORAL_TABLET | Freq: Every day | ORAL | Status: DC
  Administered 2022-02-05 – 2022-02-14 (×10): 1 mg via ORAL
  Filled 2022-02-04 (×10): qty 1

## 2022-02-04 MED ORDER — RISPERIDONE 1 MG PO TABS
1.0000 mg | ORAL_TABLET | Freq: Three times a day (TID) | ORAL | Status: DC
  Administered 2022-02-05 – 2022-02-14 (×29): 1 mg via ORAL
  Filled 2022-02-04 (×31): qty 1

## 2022-02-04 MED ORDER — ASPIRIN 81 MG PO TBEC
81.0000 mg | DELAYED_RELEASE_TABLET | Freq: Every day | ORAL | Status: DC
  Administered 2022-02-05 – 2022-02-14 (×10): 81 mg via ORAL
  Filled 2022-02-04 (×10): qty 1

## 2022-02-04 MED ORDER — LAMOTRIGINE 25 MG PO TABS
25.0000 mg | ORAL_TABLET | Freq: Two times a day (BID) | ORAL | Status: DC
  Administered 2022-02-05 – 2022-02-14 (×20): 25 mg via ORAL
  Filled 2022-02-04 (×20): qty 1

## 2022-02-04 MED ORDER — LACTATED RINGERS IV SOLN: INTRAVENOUS | Status: AC

## 2022-02-04 MED ORDER — ROSUVASTATIN CALCIUM 20 MG PO TABS: 10.0000 mg | ORAL_TABLET | Freq: Every day | ORAL | Status: DC

## 2022-02-04 MED ORDER — ASPIRIN 81 MG PO CHEW
324.0000 mg | CHEWABLE_TABLET | Freq: Once | ORAL | Status: AC
  Administered 2022-02-04: 324 mg via ORAL
  Filled 2022-02-04: qty 4

## 2022-02-04 NOTE — Assessment & Plan Note (Addendum)
Patient presenting with 1 day history of altered mental status with fatigue of unknown etiology.  Given CK elevation, differential includes postictal state in the setting of known seizure disorder.  Per chart review, his last seizure was over 1 year ago.  Differential also includes metabolic encephalopathy in the setting of infection (COVID-19), however patient is otherwise asymptomatic from [place].  MRI brain without any evidence of acute abnormalities.  - Workup still pending includes vitamin B12, TSH, RPR - all negative - Restart home antiepileptics

## 2022-02-04 NOTE — ED Notes (Signed)
Patient transported to MRI 

## 2022-02-04 NOTE — Assessment & Plan Note (Signed)
EKG and telemetry with persistent ventricular bigeminy with no prior history of such.  Potassium is within normal limits.    - Telemetry monitoring - Cardiology consulted; appreciate their recommendations - Magnesium pending; maintain above 2 - Continue to monitor potassium; maintain above 4

## 2022-02-04 NOTE — ED Notes (Signed)
Patient remains confused, alert and oriented x 1. Has no complaints of chest pain or SOB.   Pt remains on cardiac monitor. BP cycling q30 min. Pt expresses no needs at this time.

## 2022-02-04 NOTE — ED Notes (Signed)
Called MRI staff and gave them pt's caregiver's number and name as they called while I&O cath being completed to try and screen pt and this RN doesn't think pt can provide all answers needed currently.

## 2022-02-04 NOTE — Assessment & Plan Note (Addendum)
Patient presenting with troponin elevation up to 1200 with downtrend.  He is asymptomatic with no chest pain currently.  EKG demonstrating new ventricular bigeminy.  EDP discussed with Dr. Humphrey Rolls who is not currently recommending heparin infusion.  - Cardiology consulted; appreciate their recommendations - Start aspirin - Hold home rosuvastatin in the setting of rhabdomyolysis - Stat EKG and troponin if chest pain should occur - Echocardiogram shows EF 40-45%, LAE, aortic regurg

## 2022-02-04 NOTE — ED Notes (Signed)
Pt's pants found to be wet as this Scientist, physiological to bedside to complete I&O cath. Pants removed and 2 briefs found to be in use at same time. Initial brief soaked in urine. Pt provided peri care and I&O cath completed. Urine sample sent to lab. Briefs and bed linens changed. Pt repositioned.

## 2022-02-04 NOTE — Assessment & Plan Note (Addendum)
Patient presenting with generalized weakness, lower extremity more so than upper extremity.  CK markedly elevated consistent with rhabdomyolysis.  Etiology uncertain at this time but differential includes breakthrough seizure in the setting of seizure disorder.  - Trend CK - dropping dramatically, still feels weak - IV fluid resuscitation

## 2022-02-04 NOTE — ED Notes (Signed)
Provider to bedside

## 2022-02-04 NOTE — ED Notes (Signed)
Other RN assisted this RN to reposition pt for swallow screen.

## 2022-02-04 NOTE — H&P (Signed)
History and Physical    Patient: Jeremy [name] WPY:[id number] DOB: Jan 22, 1946 DOA: 02/04/2022 DOS: the patient was seen and examined on 02/05/2022 PCP: Idelle Crouch, MD  Patient coming from: ALF/ILF  Chief Complaint:  Chief Complaint  Patient presents with   Fatigue   Altered Mental Status   HPI: Jeremy [name] is a 76 y.o. male with medical history significant of hypertension, hyperlipidemia, seizure disorder, dementia, prostate cancer, who presents to the ED with complaints of altered mental status.  History obtained through chart review due to patient's altered mental status.  Per chart review, patient presented via EMS from assisted living facility where he was experiencing lethargy and fatigue with altered mental status.  He is generally alert and oriented x 3 but is currently only oriented to self.  Per staff at group home, patient was within normal limits yesterday.    At this time, Jeremy [name] states she is unsure why he is in the ER and is unsure how he got here.  He denies any fever, chills, nausea, vomiting, diarrhea, abdominal pain.  He notes that he feels weak.  ED course: On arrival to the ED, patient was hypertensive at 170/71 with a heart rate of 87.  He was saturating at 99% on room air.  Initial workup remarkable for creatinine of 1.98, AST of 86, troponin of 1200 with downtrend to 861, hemoglobin of 10.8 and COVID-19 PCR positive.  Urinalysis with hematuria and ketonuria but no RBCs noted.  CT head and CTA chest with no abnormalities noted.  EDP discussed with cardiology, Dr. Chancy Milroy, who did not recommend initiating heparin infusion.  TR[facility] contacted for admission for acute encephalopathy and NSTEMI.  Review of Systems: As mentioned in the history of present illness. All other systems reviewed and are negative.  Past Medical History:  Diagnosis Date   Cancer ([place]) 2019   prostate /radiation   Dementia [facility])    Hypertension    Seizures ([place])     last seizure 2020   Past Surgical History:  Procedure Laterality Date   FLEXOR TENDON REPAIR Right 02/12/2021   Procedure: Right hand contracture release - Flexor release, pinning of PIP joints;  Surgeon: Hessie Knows, MD;  Location: [facility] ORS;  Service: Orthopedics;  Laterality: Right;   PROSTATE BIOPSY N/A 12/15/2017   Procedure: BIOPSY TRANSRECTAL ULTRASONIC PROSTATE (TUBP);  Surgeon: [name], MD;  Location: [facility] ORS;  Service: Urology;  Laterality: N/A;   ROBOT ASSISTED INGUINAL HERNIA REPAIR Right 09/02/2018   Procedure: ROBOT ASSISTED INGUINAL HERNIA REPAIR AND UMBILICAL HERNIA, RIGHT;  Surgeon: Jules Husbands, MD;  Location: [facility] ORS;  Service: General;  Laterality: Right;   Social History:  reports that he has quit smoking. His smoking use included cigarettes. He has never used smokeless tobacco. He reports that he does not currently use alcohol. He reports that he does not currently use drugs after having used the following drugs: Cocaine and IV.  No Known Allergies  No family history on file.  Prior to Admission medications   Medication Sig Start Date End Date Taking? Authorizing Provider  CALCIUM + VITAMIN D3 600-10 MG-MCG TABS TAKE 1 TABLET BY MOUTH 2 TIMES DAILY 01/14/21  Yes Stoioff, Ronda Fairly, MD  cyanocobalamin (,VITAMIN B-12,) 1000 MCG/ML injection Inject 1,000 mcg into the muscle every 30 (thirty) days.   Yes [provider]  lamoTRIgine (LAMICTAL) 25 MG tablet Take 25 mg by mouth 2 (two) times daily. 03/02/18  Yes [provider]  levETIRAcetam (  KEPPRA) 500 MG tablet Take 500 mg by mouth 2 (two) times daily. 01/27/19 02/04/22 Yes [provider]  risperiDONE (RISPERDAL) 2 MG tablet Take 1 mg by mouth 3 (three) times daily.   Yes [provider]  rosuvastatin (CRESTOR) 10 MG tablet Take 10 mg by mouth daily. 11/02/19 02/04/22 Yes [provider]  tamsulosin (FLOMAX) 0.4 MG CAPS capsule Take 0.4 mg by mouth daily after  supper. 07/01/19  Yes [provider]  acetaminophen (TYLENOL) 325 MG tablet Take 650 mg by mouth every 4 (four) hours as needed for moderate pain, fever or headache.    [provider]  alum & mag hydroxide-simeth (MAALOX/MYLANTA) 200-200-20 MG/5ML suspension Take 30 mLs by mouth daily as needed for indigestion or heartburn.    [provider]  guaiFENesin (ROBITUSSIN) 100 MG/5ML liquid Take 200 mg by mouth every 6 (six) hours as needed for cough.    [provider]  HYDROcodone-acetaminophen (NORCO) 5-325 MG tablet Take 1-2 tablets by mouth every 6 (six) hours as needed for moderate pain. 02/12/21   Hessie Knows, MD  loperamide (IMODIUM) 2 MG capsule Take 2 mg by mouth 4 (four) times daily as needed for diarrhea or loose stools.    [provider]  magnesium hydroxide (MILK OF MAGNESIA) 400 MG/5ML suspension Take 30 mLs by mouth daily as needed for mild constipation.    [provider]    Physical Exam: Vitals:   02/04/22 1930 02/04/22 2000 02/04/22 2030 02/04/22 2100  BP: (!) 172/61 (!) 178/58 (!) 171/59 (!) 162/62  Pulse:  (!) 48 (!) 45 (!) 45  Resp: '15 15 14 14  '$ Temp:      TempSrc:      SpO2: 96% 98% 96% 97%  Weight:       Physical Exam Vitals and nursing note reviewed.  Constitutional:      General: He is not in acute distress.    Appearance: He is normal weight. He is not toxic-appearing.  HENT:     Head: Normocephalic and atraumatic.     Mouth/Throat:     Mouth: Mucous membranes are moist.     Pharynx: Oropharynx is clear.  Eyes:     General: No scleral icterus.    Extraocular Movements: Extraocular movements intact.     Conjunctiva/sclera: Conjunctivae normal.     Pupils: Pupils are equal, round, and reactive to light.  Cardiovascular:     Rate and Rhythm: Normal rate. Rhythm irregular.     Heart sounds: No murmur heard.    No gallop.  Pulmonary:     Effort: Pulmonary effort is normal. No respiratory distress.      Breath sounds: Normal breath sounds. No wheezing, rhonchi or rales.  Abdominal:     General: Bowel sounds are normal. There is no distension.     Palpations: Abdomen is soft.     Tenderness: There is no abdominal tenderness. There is no guarding.  Musculoskeletal:     Cervical back: Neck supple.     Right lower leg: No edema.     Left lower leg: No edema.  Skin:    General: Skin is warm and dry.     Findings: No bruising, lesion or rash.  Neurological:     Mental Status: He is alert. He is disoriented.     Comments:  Patient alert and oriented to self and location (able to recognize we are in a hospital). He is disoriented to time and situation.   No cranial  nerve deficits noted. No dysarthria or facial asymmetry.   Sensation intact throughout  2/5 bilateral lower extremity strength, equal bilaterally.  3/5 bilateral upper extremity strength, equal bilaterally  Mild dysmetria noted on left upper extremity with finger to nose. Right WNL.   Psychiatric:        Mood and Affect: Mood normal.        Behavior: Behavior normal.    Data Reviewed: CBC with WBC of 6.3, hemoglobin of 10.8, MCV of 86, platelets of 168. CMP with potassium of 3.9, bicarb of 22, glucose of 118, BUN of 16, creatinine of 1.98, AST of 86 and GFR of 35. BNP elevated at 358 Troponin elevated at 1216 with downtrend to 861. CK elevated 8300. COVID-19 PCR positive Urinalysis with increased specific gravity, large hemoglobin, ketonuria, proteinuria, and no RBCs noted. Ammonia within normal limits.  EKG personally reviewed.  Sinus rhythm with rate of 102.  Ventricular bigeminy noted.  No other ST or T wave changes to suggest acute ischemia.  No prior history of EKG with ventricular bigeminy noted.  MR BRAIN WO CONTRAST  Result Date: 02/04/2022 CLINICAL DATA:  Mental status change EXAM: MRI HEAD WITHOUT CONTRAST TECHNIQUE: Multiplanar, multiecho pulse sequences of the brain and surrounding structures were obtained  without intravenous contrast. COMPARISON:  No prior MRI available, correlation is made with 02/04/2022 CT head FINDINGS: Evaluation is somewhat limited by motion artifact. Brain: No restricted diffusion to suggest acute or subacute infarct.No acute hemorrhage, mass, mass effect, or midline shift. No hydrocephalus or extra-axial collection.No hemosiderin deposition to suggest remote hemorrhage.Normal pituitary and craniocervical junction.Advanced cerebral atrophy for age. Scattered and confluent T2 hyperintense signal in the periventricular white matter, likely the sequela of moderate chronic small vessel ischemic disease. Vascular: Patent arterial flow voids. Skull and upper cervical spine: Normal marrow signal. Sinuses/Orbits: Clear paranasal sinuses.No acute finding in the orbits. Other: The mastoid air cells are well aerated. IMPRESSION: No acute intracranial process. No evidence of acute or subacute infarct. Electronically Signed   By: Merilyn Baba M.D.   On: 02/04/2022 22:59   CT Angio Chest PE W/Cm &/Or Wo Cm  Result Date: 02/04/2022 CLINICAL DATA:  Fatigue altered mental status EXAM: CT ANGIOGRAPHY CHEST WITH CONTRAST TECHNIQUE: Multidetector CT imaging of the chest was performed using the standard protocol during bolus administration of intravenous contrast. Multiplanar CT image reconstructions and MIPs were obtained to evaluate the vascular anatomy. RADIATION DOSE REDUCTION: This exam was performed according to the departmental dose-optimization program which includes automated exposure control, adjustment of the mA and/or kV according to patient size and/or use of iterative reconstruction technique. CONTRAST:  19m OMNIPAQUE IOHEXOL 350 MG/ML SOLN COMPARISON:  Chest x-ray 02/04/2022, CT chest 03/12/2017, 01/13/2015 FINDINGS: Cardiovascular: Satisfactory opacification of the pulmonary arteries to the segmental level. No evidence of pulmonary embolism. Normal heart size. No pericardial effusion. Mild  aortic atherosclerosis. No aneurysm. No dissection. Normal cardiac size. No pericardial effusion Mediastinum/Nodes: Midline trachea. No thyroid mass. No suspicious lymph nodes. Esophagus within normal limits. Lungs/Pleura: No acute airspace disease or effusion. Mild apical emphysema. 7 mm right middle lobe pulmonary nodule, series 6, image 87, slightly spiculated margin but no significant change since 2016 and therefore felt consistent with benign finding. Mild fibrosis in the medial right lung base. Upper Abdomen: No acute abnormality. Musculoskeletal: Degenerative changes of the spine. No acute osseous abnormality Review of the MIP images confirms the above findings. IMPRESSION: 1. Negative for acute pulmonary embolus or aortic dissection. 2. Mild emphysema. No acute  airspace disease. 3. Stable 7 mm right middle lobe pulmonary nodule since 2016 and therefore felt consistent with benign finding. No follow-up imaging is recommended 4. Aortic atherosclerosis. Aortic Atherosclerosis (ICD10-I70.0) and Emphysema (ICD10-J43.9). Electronically Signed   By: Donavan Foil M.D.   On: 02/04/2022 17:47   DG Chest Port 1 View  Result Date: 02/04/2022 CLINICAL DATA:  Altered mental status, fever. EXAM: PORTABLE CHEST 1 VIEW COMPARISON:  September 15, 2020. FINDINGS: Stable cardiomediastinal silhouette. Mild central pulmonary vascular congestion is noted. Minimal bibasilar subsegmental atelectasis or edema is noted. Bony thorax is unremarkable. IMPRESSION: Stable cardiomediastinal silhouette mild central pulmonary vascular congestion. Minimal bibasilar subsegmental atelectasis or edema. Electronically Signed   By: Marijo Conception M.D.   On: 02/04/2022 15:49   CT Head Wo Contrast  Result Date: 02/04/2022 CLINICAL DATA:  Dementia, lethargy EXAM: CT HEAD WITHOUT CONTRAST TECHNIQUE: Contiguous axial images were obtained from the base of the skull through the vertex without intravenous contrast. RADIATION DOSE REDUCTION: This  exam was performed according to the departmental dose-optimization program which includes automated exposure control, adjustment of the mA and/or kV according to patient size and/or use of iterative reconstruction technique. COMPARISON:  09/15/2020 FINDINGS: Brain: No acute infarct or hemorrhage. Stable hypodensities throughout the periventricular white matter consistent with chronic small vessel ischemic change. Lateral ventricles and midline structures are otherwise unremarkable. No acute extra-axial fluid collections. No mass effect. Vascular: No hyperdense vessel or unexpected calcification. Skull: Normal. Negative for fracture or focal lesion. Sinuses/Orbits: No acute finding. Other: None. IMPRESSION: 1. Stable head CT, no acute intracranial process. Electronically Signed   By: Randa Ngo M.D.   On: 02/04/2022 15:48    Results are pending, will review when available.  Assessment and Plan: * Acute encephalopathy Patient presenting with 1 day history of altered mental status with fatigue of unknown etiology.  Given CK elevation, differential includes postictal state in the setting of known seizure disorder.  Per chart review, his last seizure was over 1 year ago.  Differential also includes metabolic encephalopathy in the setting of infection (COVID-19), however patient is otherwise asymptomatic from [place].  MRI brain without any evidence of acute abnormalities.  - Workup still pending includes vitamin B12, TSH, RPR - Restart home antiepileptics  Rhabdomyolysis Patient presenting with generalized weakness, lower extremity more so than upper extremity.  CK markedly elevated consistent with rhabdomyolysis.  Etiology uncertain at this time but differential includes breakthrough seizure in the setting of seizure disorder.  - Trend CK - IV fluid resuscitation  NSTEMI (non-ST elevated myocardial infarction) [facility]) Patient presenting with troponin elevation up to 1200 with downtrend.  He is  asymptomatic with no chest pain currently.  EKG demonstrating new ventricular bigeminy.  EDP discussed with Dr. Humphrey Rolls who is not currently recommending heparin infusion.  - Cardiology consulted; appreciate their recommendations - Start aspirin - Hold home rosuvastatin in the setting of rhabdomyolysis - Stat EKG and troponin if chest pain should occur - Echocardiogram ordered  Ventricular bigeminy EKG and telemetry with persistent ventricular bigeminy with no prior history of such.  Potassium is within normal limits.    - Telemetry monitoring - Cardiology consulted; appreciate their recommendations - Magnesium pending; maintain above 2 - Continue to monitor potassium; maintain above 4  COVID-19 virus infection Patient denies any upper respiratory symptoms, shortness of breath or abdominal symptoms.  Given he is asymptomatic and borderline renal function, will hold off on any treatment at this time.  - Per CDC guidelines, patient should  isolate for 5 days if he remains asymptomatic.  If he should develop symptoms, plan to isolate for 10 days.  Seizures ([place]) Given acute encephalopathy and rhabdomyolysis, there is concern for breakthrough seizures.  At this time, no seizure activity noted on examination.  -Restart home antiepileptics  Hypertension Per chart review, patient is not currently taking any antihypertensives.  His blood pressure is elevated, however he is asymptomatic at this time.  Will monitor overnight and if remains elevated, would consider starting amlodipine.  Stage 3b chronic kidney disease (CKD) ([place]) Renal function at baseline at this time.  -Continue to monitor renal function while admitted  Dementia without behavioral disturbance [facility]) Patient has a history of dementia but is apparently alert and oriented x 3 at baseline.  - Restart home risperidone tomorrow  Advance Care Planning:   Code Status: Full Code.  Patient is altered and does not currently have  capacity for medical decision-making.  Consults: Cardiology  Family Communication: No family at bedside  Severity of Illness: The appropriate patient status for this patient is OBSERVATION. Observation status is judged to be reasonable and necessary in order to provide the required intensity of service to ensure the patient's safety. The patient's presenting symptoms, physical exam findings, and initial radiographic and laboratory data in the context of their medical condition is felt to place them at decreased risk for further clinical deterioration. Furthermore, it is anticipated that the patient will be medically stable for discharge from the hospital within 2 midnights of admission.   Author: Jose Persia, MD 02/05/2022 12:04 AM  For on call review [email or web address].

## 2022-02-04 NOTE — ED Notes (Signed)
(  [phone]. Jeremy [name] (caregiver).

## 2022-02-04 NOTE — ED Notes (Signed)
Called lab to add on ck; staff states they'll do so now.

## 2022-02-04 NOTE — ED Notes (Signed)
Cardiac monitor reveals rhythm change, new EKG obtained - given to Dr. Cheri Fowler.

## 2022-02-04 NOTE — ED Notes (Signed)
CRITICAL TROPONIN OF 1,216 REPORTED TO DR. EVAN BRADLER

## 2022-02-04 NOTE — ED Triage Notes (Addendum)
BIBA from care home for lethargy, fatigue and AMS. Pt with hx of dementia but is usually alert and oriented x 3. Upon arrival alert to self only.   Pt with no focal neuro deficits, he denies pain or sob. Per facility staff patient was his normal self yesterday. EMS reports no strong urine smell.    No known sick contacts. BG 144

## 2022-02-04 NOTE — ED Provider Notes (Signed)
[facility] Provider Note   Event Date/Time   First MD Initiated Contact with Patient 02/04/22 1458     (approximate) History  Fatigue and Altered Mental Status  HPI Jeremy [name] is a 76 y.o. male who arrives from a "care house" via EMS with concerns for altered mental status.  Per staff patient has been minimally responsive over the past 24 hours.  Patient is normally alert and oriented x 3 as well as able to care for himself on his own but has been unable to do so over the last 3 days.  Patient is only oriented to self and further history/review of systems are unable to be obtained at this time   Physical Exam  Triage Vital Signs: ED Triage Vitals  Enc Vitals Group     BP 02/04/22 1455 (!) 170/71     Pulse Rate 02/04/22 1455 87     Resp 02/04/22 1455 20     Temp 02/04/22 1455 99.5 F (37.5 C)     Temp Source 02/04/22 1455 Oral     SpO2 02/04/22 1455 99 %     Weight 02/04/22 1456 169 lb 8 oz (76.9 kg)     Height --      Head Circumference --      Peak Flow --      Pain Score 02/04/22 1456 0     Pain Loc --      Pain Edu? --      Excl. in [place]? --    Most recent vital signs: Vitals:   02/04/22 2030 02/04/22 2100  BP: (!) 171/59 (!) 162/62  Pulse: (!) 45 (!) 45  Resp: 14 14  Temp:    SpO2: 96% 97%   General: Awake, oriented only to self. CV:  Good peripheral perfusion.  Resp:  Normal effort.  Abd:  No distention.  Other:  Elderly Caucasian male laying in bed in no acute distress ED Results / Procedures / Treatments  Labs (all labs ordered are listed, but only abnormal results are displayed) Labs Reviewed  RESP PANEL BY RT-PCR (RSV, FLU A&B, COVID)  RVPGX2 - Abnormal; Notable for the following components:      Result Value   SARS Coronavirus 2 by RT PCR POSITIVE (*)    All other components within normal limits  URINALYSIS, ROUTINE W REFLEX MICROSCOPIC - Abnormal; Notable for the following components:   Color, Urine YELLOW (*)     APPearance HAZY (*)    Specific Gravity, Urine 1.044 (*)    Hgb urine dipstick LARGE (*)    Ketones, ur 5 (*)    Protein, ur >=300 (*)    All other components within normal limits  COMPREHENSIVE METABOLIC PANEL - Abnormal; Notable for the following components:   Glucose, Bld 118 (*)    Creatinine, Ser 1.98 (*)    AST 86 (*)    GFR, Estimated 35 (*)    All other components within normal limits  CBC WITH DIFFERENTIAL/PLATELET - Abnormal; Notable for the following components:   RBC 3.80 (*)    Hemoglobin 10.8 (*)    HCT 32.7 (*)    RDW 16.2 (*)    Lymphs Abs 0.6 (*)    All other components within normal limits  BRAIN NATRIURETIC PEPTIDE - Abnormal; Notable for the following components:   B Natriuretic Peptide 358.6 (*)    All other components within normal limits  CK - Abnormal; Notable for the following components:   Total  CK 8,343 (*)    All other components within normal limits  TROPONIN I (HIGH SENSITIVITY) - Abnormal; Notable for the following components:   Troponin I (High Sensitivity) 1,216 (*)    All other components within normal limits  TROPONIN I (HIGH SENSITIVITY) - Abnormal; Notable for the following components:   Troponin I (High Sensitivity) 861 (*)    All other components within normal limits  CULTURE, BLOOD (ROUTINE X 2)  CULTURE, BLOOD (ROUTINE X 2)  AMMONIA  MAGNESIUM  VITAMIN B12  TSH  RPR  HIV ANTIBODY (ROUTINE TESTING W REFLEX)  HEPATITIS PANEL, ACUTE  COMPREHENSIVE METABOLIC PANEL  CBC  MAGNESIUM   EKG ED ECG REPORT I, Naaman Plummer, the attending physician, personally viewed and interpreted this ECG. Date: 02/04/2022 EKG Time: 1607 Rate: 102 Rhythm: Ventricular bigeminy QRS Axis: normal Intervals: normal ST/T Wave abnormalities: normal Narrative Interpretation: Ventricular bigeminy.  No evidence of acute ischemia RADIOLOGY ED MD interpretation: MRI brain with contrast shows no evidence of acute abnormalities.  This imaging was interpreted  by me.  CT angiography of the chest is negative for acute pulmonary embolus or aortic dissection however there is no old emphysema with no acute airspace disease  Single view portable chest x-ray shows stable cardiomediastinal silhouette mild central pulmonary vascular congestion and minimal bibasilar subsegmental atelectasis or edema  CT of the head without contrast interpreted by me shows no evidence of acute abnormalities including no intracerebral hemorrhage, obvious masses, or significant edema -Agree with radiology assessment Official radiology report(s): MR BRAIN WO CONTRAST  Result Date: 02/04/2022 CLINICAL DATA:  Mental status change EXAM: MRI HEAD WITHOUT CONTRAST TECHNIQUE: Multiplanar, multiecho pulse sequences of the brain and surrounding structures were obtained without intravenous contrast. COMPARISON:  No prior MRI available, correlation is made with 02/04/2022 CT head FINDINGS: Evaluation is somewhat limited by motion artifact. Brain: No restricted diffusion to suggest acute or subacute infarct.No acute hemorrhage, mass, mass effect, or midline shift. No hydrocephalus or extra-axial collection.No hemosiderin deposition to suggest remote hemorrhage.Normal pituitary and craniocervical junction.Advanced cerebral atrophy for age. Scattered and confluent T2 hyperintense signal in the periventricular white matter, likely the sequela of moderate chronic small vessel ischemic disease. Vascular: Patent arterial flow voids. Skull and upper cervical spine: Normal marrow signal. Sinuses/Orbits: Clear paranasal sinuses.No acute finding in the orbits. Other: The mastoid air cells are well aerated. IMPRESSION: No acute intracranial process. No evidence of acute or subacute infarct. Electronically Signed   By: Merilyn Baba M.D.   On: 02/04/2022 22:59   CT Angio Chest PE W/Cm &/Or Wo Cm  Result Date: 02/04/2022 CLINICAL DATA:  Fatigue altered mental status EXAM: CT ANGIOGRAPHY CHEST WITH CONTRAST  TECHNIQUE: Multidetector CT imaging of the chest was performed using the standard protocol during bolus administration of intravenous contrast. Multiplanar CT image reconstructions and MIPs were obtained to evaluate the vascular anatomy. RADIATION DOSE REDUCTION: This exam was performed according to the departmental dose-optimization program which includes automated exposure control, adjustment of the mA and/or kV according to patient size and/or use of iterative reconstruction technique. CONTRAST:  46m OMNIPAQUE IOHEXOL 350 MG/ML SOLN COMPARISON:  Chest x-ray 02/04/2022, CT chest 03/12/2017, 01/13/2015 FINDINGS: Cardiovascular: Satisfactory opacification of the pulmonary arteries to the segmental level. No evidence of pulmonary embolism. Normal heart size. No pericardial effusion. Mild aortic atherosclerosis. No aneurysm. No dissection. Normal cardiac size. No pericardial effusion Mediastinum/Nodes: Midline trachea. No thyroid mass. No suspicious lymph nodes. Esophagus within normal limits. Lungs/Pleura: No acute airspace disease or effusion.  Mild apical emphysema. 7 mm right middle lobe pulmonary nodule, series 6, image 87, slightly spiculated margin but no significant change since 2016 and therefore felt consistent with benign finding. Mild fibrosis in the medial right lung base. Upper Abdomen: No acute abnormality. Musculoskeletal: Degenerative changes of the spine. No acute osseous abnormality Review of the MIP images confirms the above findings. IMPRESSION: 1. Negative for acute pulmonary embolus or aortic dissection. 2. Mild emphysema. No acute airspace disease. 3. Stable 7 mm right middle lobe pulmonary nodule since 2016 and therefore felt consistent with benign finding. No follow-up imaging is recommended 4. Aortic atherosclerosis. Aortic Atherosclerosis (ICD10-I70.0) and Emphysema (ICD10-J43.9). Electronically Signed   By: Donavan Foil M.D.   On: 02/04/2022 17:47   DG Chest Port 1 View  Result Date:  02/04/2022 CLINICAL DATA:  Altered mental status, fever. EXAM: PORTABLE CHEST 1 VIEW COMPARISON:  September 15, 2020. FINDINGS: Stable cardiomediastinal silhouette. Mild central pulmonary vascular congestion is noted. Minimal bibasilar subsegmental atelectasis or edema is noted. Bony thorax is unremarkable. IMPRESSION: Stable cardiomediastinal silhouette mild central pulmonary vascular congestion. Minimal bibasilar subsegmental atelectasis or edema. Electronically Signed   By: Marijo Conception M.D.   On: 02/04/2022 15:49   CT Head Wo Contrast  Result Date: 02/04/2022 CLINICAL DATA:  Dementia, lethargy EXAM: CT HEAD WITHOUT CONTRAST TECHNIQUE: Contiguous axial images were obtained from the base of the skull through the vertex without intravenous contrast. RADIATION DOSE REDUCTION: This exam was performed according to the departmental dose-optimization program which includes automated exposure control, adjustment of the mA and/or kV according to patient size and/or use of iterative reconstruction technique. COMPARISON:  09/15/2020 FINDINGS: Brain: No acute infarct or hemorrhage. Stable hypodensities throughout the periventricular white matter consistent with chronic small vessel ischemic change. Lateral ventricles and midline structures are otherwise unremarkable. No acute extra-axial fluid collections. No mass effect. Vascular: No hyperdense vessel or unexpected calcification. Skull: Normal. Negative for fracture or focal lesion. Sinuses/Orbits: No acute finding. Other: None. IMPRESSION: 1. Stable head CT, no acute intracranial process. Electronically Signed   By: Randa Ngo M.D.   On: 02/04/2022 15:48   PROCEDURES: Critical Care performed: No .1-3 Lead EKG Interpretation  Performed by: Naaman Plummer, MD Authorized by: Naaman Plummer, MD     Interpretation: abnormal     ECG rate:  103   ECG rate assessment: tachycardic     Rhythm: sinus rhythm     Ectopy: none     Conduction: normal   Comments:      Ventricular bigeminy  MEDICATIONS ORDERED IN ED: Medications  lactated ringers bolus 1,000 mL (has no administration in time range)    Followed by  lactated ringers infusion (has no administration in time range)  enoxaparin (LOVENOX) injection 40 mg (has no administration in time range)  sodium chloride flush (NS) 0.9 % injection 3 mL (has no administration in time range)  acetaminophen (TYLENOL) tablet 650 mg (has no administration in time range)    Or  acetaminophen (TYLENOL) suppository 650 mg (has no administration in time range)  polyethylene glycol (MIRALAX / GLYCOLAX) packet 17 g (has no administration in time range)  ondansetron (ZOFRAN) tablet 4 mg (has no administration in time range)    Or  ondansetron (ZOFRAN) injection 4 mg (has no administration in time range)  aspirin EC tablet 81 mg (has no administration in time range)  multivitamin with minerals tablet 1 tablet (has no administration in time range)  folic acid (FOLVITE) tablet 1 mg (  has no administration in time range)  thiamine (VITAMIN B1) tablet 100 mg (has no administration in time range)  levETIRAcetam (KEPPRA) tablet 500 mg (has no administration in time range)  lamoTRIgine (LAMICTAL) tablet 25 mg (has no administration in time range)  risperiDONE (RISPERDAL) tablet 1 mg (has no administration in time range)  rosuvastatin (CRESTOR) tablet 10 mg (has no administration in time range)  tamsulosin (FLOMAX) capsule 0.4 mg (has no administration in time range)  iohexol (OMNIPAQUE) 350 MG/ML injection 80 mL (80 mLs Intravenous Contrast Given 02/04/22 1717)  aspirin chewable tablet 324 mg (324 mg Oral Given 02/04/22 2012)   IMPRESSION / MDM / ASSESSMENT AND PLAN / ED COURSE  I reviewed the triage vital signs and the nursing notes.                             The patient is on the cardiac monitor to evaluate for evidence of arrhythmia and/or significant heart rate changes. Patient's presentation is most consistent  with acute presentation with potential threat to life or bodily function. Presentation most consistent with Viral Syndrome.  Patient has tested positive for COVID-19. At this time patient is not requiring submental oxygenation due to acute hypoxic respiratory failure.  Given History and Exam I have a lower suspicion for: Emergent CardioPulmonary causes [such as Acute Asthma or COPD Exacerbation, acute Heart Failure or exacerbation, PE, PTX, atypical ACS, PNA]. Emergent Otolaryngeal causes [such as PTA, RPA, Ludwigs, Epiglottitis, EBV].  Regarding Emergent Travel or Immunosuppressive related infectious: I have a low suspicion for acute HIV.  Given persistent altered mental status, elevated troponin, and need for further evaluation and management, patient will require admission Consult: I spoke to on-call cardiologist who recommended checking CK for possible rhabdo.  CK was indeed elevated at 8300.  Dispo: Admit to medicine   FINAL CLINICAL IMPRESSION(S) / ED DIAGNOSES   Final diagnoses:  Altered mental status, unspecified altered mental status type  Dehydration  COVID-19 virus infection  Elevated troponin  Non-traumatic rhabdomyolysis   Rx / DC Orders   ED Discharge Orders     None      Note:  This document was prepared using Dragon voice recognition software and may include unintentional dictation errors.   Naaman Plummer, MD 02/04/22 [phone]

## 2022-02-05 ENCOUNTER — Observation Stay
Admit: 2022-02-05 | Discharge: 2022-02-05 | Disposition: A | Discharge status: Home / Self Care | Payer: Medicare HMO | Attending: Internal Medicine | Admitting: Internal Medicine

## 2022-02-05 ENCOUNTER — Encounter: Payer: Self-pay | Admitting: Internal Medicine

## 2022-02-05 DIAGNOSIS — G9340 Encephalopathy, unspecified: Secondary | ICD-10-CM | POA: Diagnosis not present

## 2022-02-05 DIAGNOSIS — R7989 Other specified abnormal findings of blood chemistry: Secondary | ICD-10-CM | POA: Diagnosis not present

## 2022-02-05 DIAGNOSIS — I214 Non-ST elevation (NSTEMI) myocardial infarction: Secondary | ICD-10-CM | POA: Diagnosis not present

## 2022-02-05 LAB — HEPATITIS PANEL, ACUTE
HCV Ab: NONREACTIVE
Hep A IgM: NONREACTIVE
Hep B C IgM: NONREACTIVE
Hepatitis B Surface Ag: NONREACTIVE

## 2022-02-05 LAB — RPR: RPR Ser Ql: NONREACTIVE

## 2022-02-05 LAB — COMPREHENSIVE METABOLIC PANEL
ALT: 25 U/L (ref 0–44)
AST: 113 U/L — ABNORMAL HIGH (ref 15–41)
Albumin: 3.3 g/dL — ABNORMAL LOW (ref 3.5–5.0)
Alkaline Phosphatase: 46 U/L (ref 38–126)
Anion gap: 8 (ref 5–15)
BUN: 14 mg/dL (ref 8–23)
CO2: 23 mmol/L (ref 22–32)
Calcium: 8.8 mg/dL — ABNORMAL LOW (ref 8.9–10.3)
Chloride: 112 mmol/L — ABNORMAL HIGH (ref 98–111)
Creatinine, Ser: 1.81 mg/dL — ABNORMAL HIGH (ref 0.61–1.24)
GFR, Estimated: 39 mL/min — ABNORMAL LOW (ref >60)
Glucose, Bld: 100 mg/dL — ABNORMAL HIGH (ref 70–99)
Potassium: 3.6 mmol/L (ref 3.5–5.1)
Sodium: 143 mmol/L (ref 135–145)
Total Bilirubin: 1.1 mg/dL (ref 0.3–1.2)
Total Protein: 6.5 g/dL (ref 6.5–8.1)

## 2022-02-05 LAB — CBC
HCT: 32.9 % — ABNORMAL LOW (ref 39.0–52.0)
Hemoglobin: 10.8 g/dL — ABNORMAL LOW (ref 13.0–17.0)
MCH: 28.1 pg (ref 26.0–34.0)
MCHC: 32.8 g/dL (ref 30.0–36.0)
MCV: 85.7 fL (ref 80.0–100.0)
Platelets: 155 10*3/uL (ref 150–400)
RBC: 3.84 MIL/uL — ABNORMAL LOW (ref 4.22–5.81)
RDW: 16.3 % — ABNORMAL HIGH (ref 11.5–15.5)
WBC: 5.5 10*3/uL (ref 4.0–10.5)
nRBC: 0 % (ref 0.0–0.2)

## 2022-02-05 LAB — IRON AND TIBC
Iron: 23 ug/dL — ABNORMAL LOW (ref 45–182)
Saturation Ratios: 9 % — ABNORMAL LOW (ref 17.9–39.5)
TIBC: 248 ug/dL — ABNORMAL LOW (ref 250–450)
UIBC: 225 ug/dL

## 2022-02-05 LAB — ECHOCARDIOGRAM COMPLETE
AV Mean grad: 8 mmHg
AV Peak grad: 13 mmHg
Ao pk vel: 1.8 m/s
Area-P 1/2: 5.34 cm2
Calc EF: 38.3 %
P 1/2 time: 312 msec
Single Plane A2C EF: 41.3 %
Single Plane A4C EF: 39.8 %
Weight: 2712 oz

## 2022-02-05 LAB — HIV ANTIBODY (ROUTINE TESTING W REFLEX): HIV Screen 4th Generation wRfx: NONREACTIVE

## 2022-02-05 LAB — MAGNESIUM: Magnesium: 1.9 mg/dL (ref 1.7–2.4)

## 2022-02-05 LAB — FOLATE: Folate: 14 ng/mL (ref >5.9)

## 2022-02-05 LAB — VITAMIN B12: Vitamin B-12: 643 pg/mL (ref 180–914)

## 2022-02-05 MED ORDER — HYDRALAZINE HCL 20 MG/ML IJ SOLN
10.0000 mg | Freq: Four times a day (QID) | INTRAMUSCULAR | Status: DC | PRN
  Administered 2022-02-06: 10 mg via INTRAVENOUS
  Filled 2022-02-05 (×2): qty 1

## 2022-02-05 NOTE — Assessment & Plan Note (Addendum)
Patient has a history of dementia but is apparently alert and oriented x 3 at baseline.  - Continue risperidone

## 2022-02-05 NOTE — Assessment & Plan Note (Signed)
Patient denies any upper respiratory symptoms, shortness of breath or abdominal symptoms.  Given he is asymptomatic and borderline renal function, will hold off on any treatment at this time.  - Per CDC guidelines, patient should isolate for 5 days if he remains asymptomatic.  If he should develop symptoms, plan to isolate for 10 days.

## 2022-02-05 NOTE — Assessment & Plan Note (Addendum)
On coreg

## 2022-02-05 NOTE — Progress Notes (Signed)
*  PRELIMINARY RESULTS* Echocardiogram 2D Echocardiogram has been performed.  Jeremy [name] 02/05/2022, 1:48 PM

## 2022-02-05 NOTE — Consult Note (Signed)
Jeremy [name] is a 76 y.o. male  [id number]  Primary Cardiologist: Neoma Laming, MD Reason for Consultation: elevated troponin levels  HPI: Patient is a 76 year old male with past medical history significant for hypertension, hyperlipidemia, seizure disorder, dementia, prostate cancer. Patient presented to ED via EMS from assisted living facility on 02/04/22 with chief complaint of lethargy and fatigue with altered mental status.    Review of Systems: denies chest pain, shortness of breath   Past Medical History:  Diagnosis Date   Cancer ([place]) 2019   prostate /radiation   Dementia ([place])    Hypertension    Seizures ([place])    last seizure 2020    (Not in a hospital admission)     aspirin EC  81 mg Oral Daily   enoxaparin (LOVENOX) injection  40 mg Subcutaneous Q03K   folic acid  1 mg Oral Daily   lamoTRIgine  25 mg Oral BID   levETIRAcetam  500 mg Oral BID   multivitamin with minerals  1 tablet Oral Daily   risperiDONE  1 mg Oral TID   sodium chloride flush  3 mL Intravenous Q12H   tamsulosin  0.4 mg Oral QPC supper   thiamine  100 mg Oral Daily    Infusions:  lactated ringers 150 mL/hr at 02/05/22 0943    No Known Allergies  Social History   Socioeconomic History   Marital status: Single    Spouse name: Not on file   Number of children: Not on file   Years of education: Not on file   Highest education level: Not on file  Occupational History   Not on file  Tobacco Use   Smoking status: Former    Years: 25.00    Types: Cigarettes   Smokeless tobacco: Never  Vaping Use   Vaping Use: Never used  Substance and Sexual Activity   Alcohol use: Not Currently   Drug use: Not Currently    Types: Cocaine, IV    Comment: none for 3 years   Sexual activity: Not Currently  Other Topics Concern   Not on file  Social History Narrative   Lives in group home  [facility]. [phone]    Social Determinants of Health   Financial Resource  Strain: Not on file  Food Insecurity: Not on file  Transportation Needs: Not on file  Physical Activity: Not on file  Stress: Not on file  Social Connections: Not on file  Intimate Partner Violence: Not on file    History reviewed. No pertinent family history.  PHYSICAL EXAM: Vitals:   02/05/22 0754 02/05/22 0800  BP:  (!) 167/89  Pulse: 93 84  Resp: 18 15  Temp: (!) 100.7 F (38.2 C)   SpO2: 96% 96%     Intake/Output Summary (Last 24 hours) at 02/05/2022 1017 Last data filed at 02/05/2022 0125 Gross per 24 hour  Intake 1000 ml  Output 200 ml  Net 800 ml    General:  Well appearing. No respiratory difficulty HEENT: normal Neck: supple. no JVD. Carotids 2+ bilat; no bruits. No lymphadenopathy or thryomegaly appreciated. Cor: PMI nondisplaced. Regular rate & rhythm. No rubs, gallops or murmurs. Lungs: clear Abdomen: soft, nontender, nondistended. No hepatosplenomegaly. No bruits or masses. Good bowel sounds. Extremities: no cyanosis, clubbing, rash, edema Neuro: alert & oriented x 3, cranial nerves grossly intact. moves all 4 extremities w/o difficulty. Affect pleasant.  ECG: sinus tachycardia, HR 82 bpm, ventricular bigeminy, non-specific ST changes  Results for orders placed or performed during the hospital encounter of 02/04/22 (from the past 24 hour(s))  Resp panel by RT-PCR (RSV, Flu A&B, Covid) Anterior Nasal Swab     Status: Abnormal   Collection Time: 02/04/22  3:20 PM   Specimen: Anterior Nasal Swab  Result Value Ref Range   SARS Coronavirus 2 by RT PCR POSITIVE (A) NEGATIVE   Influenza A by PCR NEGATIVE NEGATIVE   Influenza B by PCR NEGATIVE NEGATIVE   Resp Syncytial Virus by PCR NEGATIVE NEGATIVE  Brain natriuretic peptide     Status: Abnormal   Collection Time: 02/04/22  3:20 PM  Result Value Ref Range   B Natriuretic Peptide 358.6 (H) 0.0 - 100.0 pg/mL  CK     Status: Abnormal   Collection Time: 02/04/22  3:20 PM  Result Value Ref Range   Total CK  8,343 (H) 49 - 397 U/L  Comprehensive metabolic panel     Status: Abnormal   Collection Time: 02/04/22  3:21 PM  Result Value Ref Range   Sodium 141 135 - 145 mmol/L   Potassium 3.9 3.5 - 5.1 mmol/L   Chloride 110 98 - 111 mmol/L   CO2 22 22 - 32 mmol/L   Glucose, Bld 118 (H) 70 - 99 mg/dL   BUN 16 8 - 23 mg/dL   Creatinine, Ser 1.98 (H) 0.61 - 1.24 mg/dL   Calcium 9.5 8.9 - 10.3 mg/dL   Total Protein 7.2 6.5 - 8.1 g/dL   Albumin 3.7 3.5 - 5.0 g/dL   AST 86 (H) 15 - 41 U/L   ALT 21 0 - 44 U/L   Alkaline Phosphatase 52 38 - 126 U/L   Total Bilirubin 0.7 0.3 - 1.2 mg/dL   GFR, Estimated 35 (L) >60 mL/min   Anion gap 9 5 - 15  CBC with Differential     Status: Abnormal   Collection Time: 02/04/22  3:21 PM  Result Value Ref Range   WBC 6.3 4.0 - 10.5 K/uL   RBC 3.80 (L) 4.22 - 5.81 MIL/uL   Hemoglobin 10.8 (L) 13.0 - 17.0 g/dL   HCT 32.7 (L) 39.0 - 52.0 %   MCV 86.1 80.0 - 100.0 fL   MCH 28.4 26.0 - 34.0 pg   MCHC 33.0 30.0 - 36.0 g/dL   RDW 16.2 (H) 11.5 - 15.5 %   Platelets 168 150 - 400 K/uL   nRBC 0.0 0.0 - 0.2 %   Neutrophils Relative % 77 %   Neutro Abs 4.8 1.7 - 7.7 K/uL   Lymphocytes Relative 9 %   Lymphs Abs 0.6 (L) 0.7 - 4.0 K/uL   Monocytes Relative 14 %   Monocytes Absolute 0.9 0.1 - 1.0 K/uL   Eosinophils Relative 0 %   Eosinophils Absolute 0.0 0.0 - 0.5 K/uL   Basophils Relative 0 %   Basophils Absolute 0.0 0.0 - 0.1 K/uL   Immature Granulocytes 0 %   Abs Immature Granulocytes 0.02 0.00 - 0.07 K/uL  Troponin I (High Sensitivity)     Status: Abnormal   Collection Time: 02/04/22  3:21 PM  Result Value Ref Range   Troponin I (High Sensitivity) 1,216 (HH) <18 ng/L  Culture, blood (routine x 2)     Status: None (Preliminary result)   Collection Time: 02/04/22  3:21 PM   Specimen: BLOOD  Result Value Ref Range   Specimen Description BLOOD BLOOD RIGHT ARM    Special Requests      BOTTLES DRAWN  AEROBIC AND ANAEROBIC Blood Culture adequate volume   Culture       NO GROWTH < 24 HOURS Performed at [facility], [address] [place], [place] [address]    Report Status PENDING   Troponin I (High Sensitivity)     Status: Abnormal   Collection Time: 02/04/22  5:24 PM  Result Value Ref Range   Troponin I (High Sensitivity) 861 (HH) <18 ng/L  Culture, blood (routine x 2)     Status: None (Preliminary result)   Collection Time: 02/04/22  5:47 PM   Specimen: BLOOD  Result Value Ref Range   Specimen Description BLOOD BLOOD RIGHT HAND    Special Requests      BOTTLES DRAWN AEROBIC AND ANAEROBIC Blood Culture adequate volume   Culture      NO GROWTH < 24 HOURS Performed at [facility], [address] [place], [place] [address]    Report Status PENDING   Urinalysis, Routine w reflex microscopic Urine, In & Out Cath     Status: Abnormal   Collection Time: 02/04/22  9:25 PM  Result Value Ref Range   Color, Urine YELLOW (A) YELLOW   APPearance HAZY (A) CLEAR   Specific Gravity, Urine 1.044 (H) 1.005 - 1.030   pH 5.0 5.0 - 8.0   Glucose, UA NEGATIVE NEGATIVE mg/dL   Hgb urine dipstick LARGE (A) NEGATIVE   Bilirubin Urine NEGATIVE NEGATIVE   Ketones, ur 5 (A) NEGATIVE mg/dL   Protein, ur >=300 (A) NEGATIVE mg/dL   Nitrite NEGATIVE NEGATIVE   Leukocytes,Ua NEGATIVE NEGATIVE   RBC / HPF 0-5 0 - 5 RBC/hpf   WBC, UA 0-5 0 - 5 WBC/hpf   Bacteria, UA NONE SEEN NONE SEEN   Squamous Epithelial / LPF 0-5 0 - 5   Mucus PRESENT   Magnesium     Status: None   Collection Time: 02/04/22  9:51 PM  Result Value Ref Range   Magnesium 2.0 1.7 - 2.4 mg/dL  Vitamin B12     Status: None   Collection Time: 02/04/22  9:51 PM  Result Value Ref Range   Vitamin B-12 643 180 - 914 pg/mL  TSH     Status: None   Collection Time: 02/04/22  9:51 PM  Result Value Ref Range   TSH 0.921 0.350 - 4.500 uIU/mL  RPR     Status: None   Collection Time: 02/04/22  9:51 PM  Result Value Ref Range   RPR Ser Ql NON REACTIVE NON REACTIVE  Ammonia      Status: None   Collection Time: 02/04/22 11:00 PM  Result Value Ref Range   Ammonia 20 9 - 35 umol/L  Comprehensive metabolic panel     Status: Abnormal   Collection Time: 02/05/22  5:05 AM  Result Value Ref Range   Sodium 143 135 - 145 mmol/L   Potassium 3.6 3.5 - 5.1 mmol/L   Chloride 112 (H) 98 - 111 mmol/L   CO2 23 22 - 32 mmol/L   Glucose, Bld 100 (H) 70 - 99 mg/dL   BUN 14 8 - 23 mg/dL   Creatinine, Ser 1.81 (H) 0.61 - 1.24 mg/dL   Calcium 8.8 (L) 8.9 - 10.3 mg/dL   Total Protein 6.5 6.5 - 8.1 g/dL   Albumin 3.3 (L) 3.5 - 5.0 g/dL   AST 113 (H) 15 - 41 U/L   ALT 25 0 - 44 U/L   Alkaline Phosphatase 46 38 - 126 U/L  Total Bilirubin 1.1 0.3 - 1.2 mg/dL   GFR, Estimated 39 (L) >60 mL/min   Anion gap 8 5 - 15  CBC     Status: Abnormal   Collection Time: 02/05/22  5:05 AM  Result Value Ref Range   WBC 5.5 4.0 - 10.5 K/uL   RBC 3.84 (L) 4.22 - 5.81 MIL/uL   Hemoglobin 10.8 (L) 13.0 - 17.0 g/dL   HCT 32.9 (L) 39.0 - 52.0 %   MCV 85.7 80.0 - 100.0 fL   MCH 28.1 26.0 - 34.0 pg   MCHC 32.8 30.0 - 36.0 g/dL   RDW 16.3 (H) 11.5 - 15.5 %   Platelets 155 150 - 400 K/uL   nRBC 0.0 0.0 - 0.2 %  Magnesium     Status: None   Collection Time: 02/05/22  5:05 AM  Result Value Ref Range   Magnesium 1.9 1.7 - 2.4 mg/dL  Folate     Status: None   Collection Time: 02/05/22  5:05 AM  Result Value Ref Range   Folate 14.0 >5.9 ng/mL  Iron and TIBC     Status: Abnormal   Collection Time: 02/05/22  5:05 AM  Result Value Ref Range   Iron 23 (L) 45 - 182 ug/dL   TIBC 248 (L) 250 - 450 ug/dL   Saturation Ratios 9 (L) 17.9 - 39.5 %   UIBC 225 ug/dL   MR BRAIN WO CONTRAST  Result Date: 02/04/2022 CLINICAL DATA:  Mental status change EXAM: MRI HEAD WITHOUT CONTRAST TECHNIQUE: Multiplanar, multiecho pulse sequences of the brain and surrounding structures were obtained without intravenous contrast. COMPARISON:  No prior MRI available, correlation is made with 02/04/2022 CT head FINDINGS:  Evaluation is somewhat limited by motion artifact. Brain: No restricted diffusion to suggest acute or subacute infarct.No acute hemorrhage, mass, mass effect, or midline shift. No hydrocephalus or extra-axial collection.No hemosiderin deposition to suggest remote hemorrhage.Normal pituitary and craniocervical junction.Advanced cerebral atrophy for age. Scattered and confluent T2 hyperintense signal in the periventricular white matter, likely the sequela of moderate chronic small vessel ischemic disease. Vascular: Patent arterial flow voids. Skull and upper cervical spine: Normal marrow signal. Sinuses/Orbits: Clear paranasal sinuses.No acute finding in the orbits. Other: The mastoid air cells are well aerated. IMPRESSION: No acute intracranial process. No evidence of acute or subacute infarct. Electronically Signed   By: Merilyn Baba M.D.   On: 02/04/2022 22:59   CT Angio Chest PE W/Cm &/Or Wo Cm  Result Date: 02/04/2022 CLINICAL DATA:  Fatigue altered mental status EXAM: CT ANGIOGRAPHY CHEST WITH CONTRAST TECHNIQUE: Multidetector CT imaging of the chest was performed using the standard protocol during bolus administration of intravenous contrast. Multiplanar CT image reconstructions and MIPs were obtained to evaluate the vascular anatomy. RADIATION DOSE REDUCTION: This exam was performed according to the departmental dose-optimization program which includes automated exposure control, adjustment of the mA and/or kV according to patient size and/or use of iterative reconstruction technique. CONTRAST:  47m OMNIPAQUE IOHEXOL 350 MG/ML SOLN COMPARISON:  Chest x-ray 02/04/2022, CT chest 03/12/2017, 01/13/2015 FINDINGS: Cardiovascular: Satisfactory opacification of the pulmonary arteries to the segmental level. No evidence of pulmonary embolism. Normal heart size. No pericardial effusion. Mild aortic atherosclerosis. No aneurysm. No dissection. Normal cardiac size. No pericardial effusion Mediastinum/Nodes:  Midline trachea. No thyroid mass. No suspicious lymph nodes. Esophagus within normal limits. Lungs/Pleura: No acute airspace disease or effusion. Mild apical emphysema. 7 mm right middle lobe pulmonary nodule, series 6, image 87, slightly spiculated margin but no  significant change since 2016 and therefore felt consistent with benign finding. Mild fibrosis in the medial right lung base. Upper Abdomen: No acute abnormality. Musculoskeletal: Degenerative changes of the spine. No acute osseous abnormality Review of the MIP images confirms the above findings. IMPRESSION: 1. Negative for acute pulmonary embolus or aortic dissection. 2. Mild emphysema. No acute airspace disease. 3. Stable 7 mm right middle lobe pulmonary nodule since 2016 and therefore felt consistent with benign finding. No follow-up imaging is recommended 4. Aortic atherosclerosis. Aortic Atherosclerosis (ICD10-I70.0) and Emphysema (ICD10-J43.9). Electronically Signed   By: Donavan Foil M.D.   On: 02/04/2022 17:47   DG Chest Port 1 View  Result Date: 02/04/2022 CLINICAL DATA:  Altered mental status, fever. EXAM: PORTABLE CHEST 1 VIEW COMPARISON:  September 15, 2020. FINDINGS: Stable cardiomediastinal silhouette. Mild central pulmonary vascular congestion is noted. Minimal bibasilar subsegmental atelectasis or edema is noted. Bony thorax is unremarkable. IMPRESSION: Stable cardiomediastinal silhouette mild central pulmonary vascular congestion. Minimal bibasilar subsegmental atelectasis or edema. Electronically Signed   By: Marijo Conception M.D.   On: 02/04/2022 15:49   CT Head Wo Contrast  Result Date: 02/04/2022 CLINICAL DATA:  Dementia, lethargy EXAM: CT HEAD WITHOUT CONTRAST TECHNIQUE: Contiguous axial images were obtained from the base of the skull through the vertex without intravenous contrast. RADIATION DOSE REDUCTION: This exam was performed according to the departmental dose-optimization program which includes automated exposure control,  adjustment of the mA and/or kV according to patient size and/or use of iterative reconstruction technique. COMPARISON:  09/15/2020 FINDINGS: Brain: No acute infarct or hemorrhage. Stable hypodensities throughout the periventricular white matter consistent with chronic small vessel ischemic change. Lateral ventricles and midline structures are otherwise unremarkable. No acute extra-axial fluid collections. No mass effect. Vascular: No hyperdense vessel or unexpected calcification. Skull: Normal. Negative for fracture or focal lesion. Sinuses/Orbits: No acute finding. Other: None. IMPRESSION: 1. Stable head CT, no acute intracranial process. Electronically Signed   By: Randa Ngo M.D.   On: 02/04/2022 15:48     ASSESSMENT AND PLAN: Patient presented to ED yesterday complaining of weakness, lethargy, and altered mental status. Troponin levels 1216>861, CK 8343. Patient tested positive for Covid. Echo to be completed. Elevated troponin levels and CK likely due to possible postictal state from a breakthrough seizure. No ischemic work up necessary at this time. Continue aspirin 81 mg daily. Will continue to follow.   Engineer, drilling FNP-C

## 2022-02-05 NOTE — Assessment & Plan Note (Addendum)
Renal function at baseline at this time. Avoid nephrotoxic agents  -Continue to monitor renal function while admitted

## 2022-02-05 NOTE — Progress Notes (Signed)
       CROSS COVER NOTE  NAME: Jeremy [name] MRN: [id number] DOB : 03-30-1945    HPI/Events of Note   Nurse reports BP 192/80 in setting of NSTEMI, elevated BNP. COVID 19 infection and rhabdomyolysis  Assessment and  Interventions   Assessment: Troponin down trending BNP elevated on admit 358.6. Chest xray on admit showed mild central pulmonary congestion. Last ck 8343 12/19 1520 pm He is currently without respiratory complaints and sats 97% on room air at rest Plan: Prn hydralazine ordered for systolic above 226 or diastolic above 333. Current fluid order  completed 2330. Repeat CK ordered for am. Resume fluids if indicated      Kathlene Cote NP [facility]

## 2022-02-05 NOTE — Assessment & Plan Note (Signed)
Given acute encephalopathy and rhabdomyolysis, there is concern for breakthrough seizures.  At this time, no seizure activity noted on examination.  -Restart home antiepileptics

## 2022-02-05 NOTE — Progress Notes (Signed)
[facility] Progress Note  Patient: Jeremy [name]    UEA:[id number]  DOA: 02/04/2022     Date of Service: the patient was seen and examined on 02/05/2022  Chief Complaint  Patient presents with   Fatigue   Altered Mental Status   Brief hospital course: Jeremy [name] is a 76 y.o. male with medical history significant of hypertension, hyperlipidemia, seizure disorder, dementia, prostate cancer, who presents to the ED with complaints of altered mental status.  History obtained through chart review due to patient's altered mental status.   Per chart review, patient presented via EMS from assisted living facility where he was experiencing lethargy and fatigue with altered mental status.  He is generally alert and oriented x 3 but is currently only oriented to self.  Per staff at group home, patient was within normal limits yesterday.     At this time, Jeremy [name] states she is unsure why he is in the ER and is unsure how he got here.  He denies any fever, chills, nausea, vomiting, diarrhea, abdominal pain.  He notes that he feels weak.   ED course: On arrival to the ED, patient was hypertensive at 170/71 with a heart rate of 87.  He was saturating at 99% on room air.  Initial workup remarkable for creatinine of 1.98, AST of 86, troponin of 1200 with downtrend to 861, hemoglobin of 10.8 and COVID-19 PCR positive.  Urinalysis with hematuria and ketonuria but no RBCs noted.  CT head and CTA chest with no abnormalities noted.  EDP discussed with cardiology, Dr. Chancy Milroy, who did not recommend initiating heparin infusion.  [facility] contacted for admission for acute encephalopathy and NSTEMI.  Consults: Cardiology  Assessment and Plan: * Acute encephalopathy Patient presenting with 1 day history of altered mental status with fatigue of unknown etiology.  Given CK elevation, differential includes postictal state in the setting of known seizure disorder.  Per chart review, his last  seizure was over 1 year ago.  Differential also includes metabolic encephalopathy in the setting of infection (COVID-19), however patient is otherwise asymptomatic from [place].   MRI brain without any evidence of acute abnormalities. - vitamin B12 wnl , TSH 0.9 wnl, RPR NR - Restarted home antiepileptics   Rhabdomyolysis Patient presenting with generalized weakness, lower extremity more so than upper extremity.  CK markedly elevated consistent with rhabdomyolysis.  Etiology uncertain at this time but differential includes breakthrough seizure in the setting of seizure disorder. CK 8343   - Trend CK - IV fluid resuscitation   NSTEMI (non-ST elevated myocardial infarction) [facility]) Patient presenting with troponin elevation up to 1200 with downtrend.  He is asymptomatic with no chest pain currently.  EKG demonstrating new ventricular bigeminy.  EDP discussed with Dr. Humphrey Rolls who is not currently recommending heparin infusion.   - Cardiology consulted; appreciate their recommendations - Start aspirin - Hold home rosuvastatin in the setting of rhabdomyolysis - Stat EKG and troponin if chest pain should occur - Echocardiogram ordered   Ventricular bigeminy EKG and telemetry with persistent ventricular bigeminy with no prior history of such.  Potassium is within normal limits.     - Telemetry monitoring - Cardiology consulted; appreciate their recommendations - maintain  Mag > 2 - Continue to monitor potassium; maintain above 4   COVID-19 virus infection Patient denies any upper respiratory symptoms, shortness of breath or abdominal symptoms.  Given he is asymptomatic and borderline renal function, will hold off on any treatment at this time.   -  Per CDC guidelines, patient should isolate for 5 days if he remains asymptomatic.  If he should develop symptoms, plan to isolate for 10 days.   Seizures Given acute encephalopathy and rhabdomyolysis, there is concern for breakthrough seizures.  At this  time, no seizure activity noted on examination.   -Restart home antiepileptics   Hypertension Per chart review, patient is not currently taking any antihypertensives.  His blood pressure is elevated, however he is asymptomatic at this time.  Will monitor overnight and if remains elevated, would consider starting amlodipine.   Stage 3b chronic kidney disease (CKD) Renal function at baseline at this time.   -Continue to monitor renal function while admitted   Dementia without behavioral disturbance  Patient has a history of dementia but is apparently alert and oriented x 3 at baseline.   - Restart home risperidone tomorrow  Body mass index is 22.99 kg/m.  Interventions:   Diet: Regular diet DVT Prophylaxis: Subcutaneous Lovenox   Advance goals of care discussion: Full code  Family Communication: family was NOT present at bedside, at the time of interview.  The pt provided permission to discuss medical plan with the family. Opportunity was given to ask question and all questions were answered satisfactorily.   Disposition:  Pt is from Home, admitted with encephalopathy, COVID-positive, rhabdomyolysis, non-STEMI, still has elevated CK on IV fluid, which precludes a safe discharge. Discharge to home, when CK will be trending down.  May require 2 to 3 days.  Subjective: No significant events overnight, patient presented with acute encephalopathy, currently patient is still sleepy and lethargic, unable to offer any complaint but resting comfortably.  No any chest pain or palpitation, no shortness of breath.  Physical Exam: General: NAD.  Lethargic and tired.    Appear in mild distress, affect flat in affect Eyes: PERRLA ENT: Oral Mucosa Clear, moist  Neck: no JVD,  Cardiovascular: S1 and S2 Present, no Murmur,  Respiratory: good respiratory effort, Bilateral Air entry equal and Decreased, no Crackles, no wheezes Abdomen: Bowel Sound present, Soft and no tenderness,  Skin: no  rashes Extremities: no Pedal edema, no calf tenderness Neurologic: without any new focal findings Gait not checked due to patient safety concerns  Vitals:   02/05/22 0800 02/05/22 1100 02/05/22 1120 02/05/22 1627  BP: (!) 167/89 (!) 172/85  (!) 175/88  Pulse: 84 87  87  Resp: '15 16  16  '$ Temp:   99.6 F (37.6 C)   TempSrc:   Oral   SpO2: 96% 96%  95%  Weight:        Intake/Output Summary (Last 24 hours) at 02/05/2022 1705 Last data filed at 02/05/2022 0125 Gross per 24 hour  Intake 1000 ml  Output 200 ml  Net 800 ml   Filed Weights   02/04/22 1456  Weight: 76.9 kg    Data Reviewed: I have personally reviewed and interpreted daily labs, tele strips, imagings as discussed above. I reviewed all nursing notes, pharmacy notes, vitals, pertinent old records I have discussed plan of care as described above with RN and patient/family.  CBC: Recent Labs  Lab 02/04/22 1521 02/05/22 0505  WBC 6.3 5.5  NEUTROABS 4.8  --   HGB 10.8* 10.8*  HCT 32.7* 32.9*  MCV 86.1 85.7  PLT 168 732   Basic Metabolic Panel: Recent Labs  Lab 02/04/22 1521 02/04/22 2151 02/05/22 0505  NA 141  --  143  K 3.9  --  3.6  CL 110  --  112*  CO2 22  --  23  GLUCOSE 118*  --  100*  BUN 16  --  14  CREATININE 1.98*  --  1.81*  CALCIUM 9.5  --  8.8*  MG  --  2.0 1.9    Studies: ECHOCARDIOGRAM COMPLETE  Result Date: 02/05/2022    ECHOCARDIOGRAM REPORT   Patient Name:   Jeremy [name] Date of Exam: 02/05/2022 Medical Rec #:  [id number]             Height:       72.0 in Accession #:    [id number]            Weight:       169.5 lb Date of Birth:  Aug 29, 1945             BSA:          1.986 m Patient Age:    89 years              BP:           167/89 mmHg Patient Gender: M                     HR:           91 bpm. Exam Location:  [facility] Procedure: 2D Echo, Color Doppler and Cardiac Doppler Indications:     I21.4 NSTEMI  History:         Patient has no prior history of Echocardiogram  examinations.                  Risk Factors:Hypertension. Pt tested positive for COVID-19 on                  02/04/22.  Sonographer:     Charmayne Sheer Referring Phys:  [id number] Jose Persia Diagnosing Phys: Neoma Laming  Sonographer Comments: No parasternal window. IMPRESSIONS  1. Left ventricular ejection fraction, by estimation, is 40 to 45%. The left ventricle has mildly decreased function. The left ventricle demonstrates global hypokinesis. There is moderate left ventricular hypertrophy. Left ventricular diastolic parameters are consistent with Grade I diastolic dysfunction (impaired relaxation).  2. Right ventricular systolic function is low normal. The right ventricular size is mildly enlarged.  3. Left atrial size was moderately dilated.  4. Right atrial size was mildly dilated.  5. The mitral valve is normal in structure. Mild mitral valve regurgitation. No evidence of mitral stenosis.  6. The aortic valve is calcified. Aortic valve regurgitation is moderate. Aortic valve sclerosis/calcification is present, without any evidence of aortic stenosis.  7. The inferior vena cava is normal in size with greater than 50% respiratory variability, suggesting right atrial pressure of 3 mmHg. FINDINGS  Left Ventricle: Left ventricular ejection fraction, by estimation, is 40 to 45%. The left ventricle has mildly decreased function. The left ventricle demonstrates global hypokinesis. The left ventricular internal cavity size was normal in size. There is  moderate left ventricular hypertrophy. Left ventricular diastolic parameters are consistent with Grade I diastolic dysfunction (impaired relaxation). Right Ventricle: The right ventricular size is mildly enlarged. No increase in right ventricular wall thickness. Right ventricular systolic function is low normal. Left Atrium: Left atrial size was moderately dilated. Right Atrium: Right atrial size was mildly dilated. Pericardium: There is no evidence of pericardial  effusion. Mitral Valve: The mitral valve is normal in structure. Mild mitral valve regurgitation. No evidence of mitral valve stenosis. Tricuspid Valve: The tricuspid valve is normal in structure. Tricuspid valve  regurgitation is mild . No evidence of tricuspid stenosis. Aortic Valve: The aortic valve is calcified. Aortic valve regurgitation is moderate. Aortic regurgitation PHT measures 312 msec. Aortic valve sclerosis/calcification is present, without any evidence of aortic stenosis. Aortic valve mean gradient measures  8.0 mmHg. Aortic valve peak gradient measures 13.0 mmHg. Pulmonic Valve: The pulmonic valve was normal in structure. Pulmonic valve regurgitation is not visualized. No evidence of pulmonic stenosis. Aorta: The aortic root is normal in size and structure. Venous: The inferior vena cava is normal in size with greater than 50% respiratory variability, suggesting right atrial pressure of 3 mmHg. IAS/Shunts: No atrial level shunt detected by color flow Doppler.   LV Volumes (MOD) LV vol d, MOD A2C: 78.7 ml  Diastology LV vol d, MOD A4C: 111.0 ml LV e' medial:    5.44 cm/s LV vol s, MOD A2C: 46.2 ml  LV E/e' medial:  9.7 LV vol s, MOD A4C: 66.8 ml  LV e' lateral:   8.92 cm/s LV SV MOD A2C:     32.5 ml  LV E/e' lateral: 5.9 LV SV MOD A4C:     111.0 ml LV SV MOD BP:      36.7 ml RIGHT VENTRICLE RV Basal diam:  4.10 cm LEFT ATRIUM             Index        RIGHT ATRIUM           Index LA Vol (A2C):   53.6 ml 26.99 ml/m  RA Area:     13.60 cm LA Vol (A4C):   40.9 ml 20.60 ml/m  RA Volume:   35.20 ml  17.73 ml/m LA Biplane Vol: 49.4 ml 24.88 ml/m  AORTIC VALVE AV Vmax:           180.00 cm/s AV Vmean:          130.000 cm/s AV VTI:            0.305 m AV Peak Grad:      13.0 mmHg AV Mean Grad:      8.0 mmHg LVOT Vmax:         110.00 cm/s LVOT Vmean:        77.300 cm/s LVOT VTI:          0.184 m LVOT/AV VTI ratio: 0.60 AI PHT:            312 msec MITRAL VALVE MV Area (PHT): 5.34 cm     SHUNTS MV Decel Time:  142 msec     Systemic VTI: 0.18 m MV E velocity: 52.80 cm/s MV A velocity: 122.00 cm/s MV E/A ratio:  0.43 [employer] signed by Neoma Laming Signature Date/Time: 02/05/2022/2:29:41 PM    Final    MR BRAIN WO CONTRAST  Result Date: 02/04/2022 CLINICAL DATA:  Mental status change EXAM: MRI HEAD WITHOUT CONTRAST TECHNIQUE: Multiplanar, multiecho pulse sequences of the brain and surrounding structures were obtained without intravenous contrast. COMPARISON:  No prior MRI available, correlation is made with 02/04/2022 CT head FINDINGS: Evaluation is somewhat limited by motion artifact. Brain: No restricted diffusion to suggest acute or subacute infarct.No acute hemorrhage, mass, mass effect, or midline shift. No hydrocephalus or extra-axial collection.No hemosiderin deposition to suggest remote hemorrhage.Normal pituitary and craniocervical junction.Advanced cerebral atrophy for age. Scattered and confluent T2 hyperintense signal in the periventricular white matter, likely the sequela of moderate chronic small vessel ischemic disease. Vascular: Patent arterial flow voids. Skull and upper cervical spine: Normal marrow  signal. Sinuses/Orbits: Clear paranasal sinuses.No acute finding in the orbits. Other: The mastoid air cells are well aerated. IMPRESSION: No acute intracranial process. No evidence of acute or subacute infarct. Electronically Signed   By: Merilyn Baba M.D.   On: 02/04/2022 22:59   CT Angio Chest PE W/Cm &/Or Wo Cm  Result Date: 02/04/2022 CLINICAL DATA:  Fatigue altered mental status EXAM: CT ANGIOGRAPHY CHEST WITH CONTRAST TECHNIQUE: Multidetector CT imaging of the chest was performed using the standard protocol during bolus administration of intravenous contrast. Multiplanar CT image reconstructions and MIPs were obtained to evaluate the vascular anatomy. RADIATION DOSE REDUCTION: This exam was performed according to the departmental dose-optimization program which includes  automated exposure control, adjustment of the mA and/or kV according to patient size and/or use of iterative reconstruction technique. CONTRAST:  34m OMNIPAQUE IOHEXOL 350 MG/ML SOLN COMPARISON:  Chest x-ray 02/04/2022, CT chest 03/12/2017, 01/13/2015 FINDINGS: Cardiovascular: Satisfactory opacification of the pulmonary arteries to the segmental level. No evidence of pulmonary embolism. Normal heart size. No pericardial effusion. Mild aortic atherosclerosis. No aneurysm. No dissection. Normal cardiac size. No pericardial effusion Mediastinum/Nodes: Midline trachea. No thyroid mass. No suspicious lymph nodes. Esophagus within normal limits. Lungs/Pleura: No acute airspace disease or effusion. Mild apical emphysema. 7 mm right middle lobe pulmonary nodule, series 6, image 87, slightly spiculated margin but no significant change since 2016 and therefore felt consistent with benign finding. Mild fibrosis in the medial right lung base. Upper Abdomen: No acute abnormality. Musculoskeletal: Degenerative changes of the spine. No acute osseous abnormality Review of the MIP images confirms the above findings. IMPRESSION: 1. Negative for acute pulmonary embolus or aortic dissection. 2. Mild emphysema. No acute airspace disease. 3. Stable 7 mm right middle lobe pulmonary nodule since 2016 and therefore felt consistent with benign finding. No follow-up imaging is recommended 4. Aortic atherosclerosis. Aortic Atherosclerosis (ICD10-I70.0) and Emphysema (ICD10-J43.9). Electronically Signed   By: KDonavan FoilM.D.   On: 02/04/2022 17:47    Scheduled Meds:  aspirin EC  81 mg Oral Daily   enoxaparin (LOVENOX) injection  40 mg Subcutaneous QD98P  folic acid  1 mg Oral Daily   lamoTRIgine  25 mg Oral BID   levETIRAcetam  500 mg Oral BID   multivitamin with minerals  1 tablet Oral Daily   risperiDONE  1 mg Oral TID   sodium chloride flush  3 mL Intravenous Q12H   tamsulosin  0.4 mg Oral QPC supper   thiamine  100 mg Oral  Daily   Continuous Infusions:  lactated ringers 150 mL/hr at 02/05/22 1141   PRN Meds: acetaminophen **OR** acetaminophen, ondansetron **OR** ondansetron (ZOFRAN) IV, polyethylene glycol  Time spent: 35 minutes  Author: DVal Riles MD [facility] 02/05/2022 5:05 PM  To reach On-call, see care teams to locate the attending and reach out to them via [email or web address] If 7PM-7AM, please contact night-coverage If you still have difficulty reaching the attending provider, please page the [facility] Call) for [facility] on amion for assistance.

## 2022-02-06 DIAGNOSIS — Z87891 Personal history of nicotine dependence: Secondary | ICD-10-CM | POA: Diagnosis not present

## 2022-02-06 DIAGNOSIS — G9341 Metabolic encephalopathy: Secondary | ICD-10-CM | POA: Diagnosis present

## 2022-02-06 DIAGNOSIS — Z923 Personal history of irradiation: Secondary | ICD-10-CM | POA: Diagnosis not present

## 2022-02-06 DIAGNOSIS — U071 COVID-19: Secondary | ICD-10-CM | POA: Diagnosis present

## 2022-02-06 DIAGNOSIS — E860 Dehydration: Secondary | ICD-10-CM | POA: Diagnosis present

## 2022-02-06 DIAGNOSIS — Z8546 Personal history of malignant neoplasm of prostate: Secondary | ICD-10-CM | POA: Diagnosis not present

## 2022-02-06 DIAGNOSIS — M6282 Rhabdomyolysis: Secondary | ICD-10-CM | POA: Diagnosis present

## 2022-02-06 DIAGNOSIS — I214 Non-ST elevation (NSTEMI) myocardial infarction: Secondary | ICD-10-CM | POA: Diagnosis present

## 2022-02-06 DIAGNOSIS — I130 Hypertensive heart and chronic kidney disease with heart failure and stage 1 through stage 4 chronic kidney disease, or unspecified chronic kidney disease: Secondary | ICD-10-CM | POA: Diagnosis present

## 2022-02-06 DIAGNOSIS — R569 Unspecified convulsions: Secondary | ICD-10-CM | POA: Diagnosis not present

## 2022-02-06 DIAGNOSIS — E785 Hyperlipidemia, unspecified: Secondary | ICD-10-CM | POA: Diagnosis present

## 2022-02-06 DIAGNOSIS — R824 Acetonuria: Secondary | ICD-10-CM | POA: Diagnosis present

## 2022-02-06 DIAGNOSIS — G9340 Encephalopathy, unspecified: Secondary | ICD-10-CM | POA: Diagnosis not present

## 2022-02-06 DIAGNOSIS — R7989 Other specified abnormal findings of blood chemistry: Secondary | ICD-10-CM | POA: Diagnosis present

## 2022-02-06 DIAGNOSIS — I10 Essential (primary) hypertension: Secondary | ICD-10-CM | POA: Diagnosis not present

## 2022-02-06 DIAGNOSIS — E611 Iron deficiency: Secondary | ICD-10-CM | POA: Diagnosis present

## 2022-02-06 DIAGNOSIS — R319 Hematuria, unspecified: Secondary | ICD-10-CM | POA: Diagnosis present

## 2022-02-06 DIAGNOSIS — Z602 Problems related to living alone: Secondary | ICD-10-CM | POA: Diagnosis present

## 2022-02-06 DIAGNOSIS — G40909 Epilepsy, unspecified, not intractable, without status epilepticus: Secondary | ICD-10-CM | POA: Diagnosis present

## 2022-02-06 DIAGNOSIS — N1832 Chronic kidney disease, stage 3b: Secondary | ICD-10-CM | POA: Diagnosis present

## 2022-02-06 DIAGNOSIS — R008 Other abnormalities of heart beat: Secondary | ICD-10-CM | POA: Diagnosis present

## 2022-02-06 DIAGNOSIS — I5042 Chronic combined systolic (congestive) and diastolic (congestive) heart failure: Secondary | ICD-10-CM | POA: Diagnosis present

## 2022-02-06 DIAGNOSIS — I498 Other specified cardiac arrhythmias: Secondary | ICD-10-CM | POA: Diagnosis not present

## 2022-02-06 DIAGNOSIS — Z79899 Other long term (current) drug therapy: Secondary | ICD-10-CM | POA: Diagnosis not present

## 2022-02-06 DIAGNOSIS — F0390 Unspecified dementia without behavioral disturbance: Secondary | ICD-10-CM | POA: Diagnosis present

## 2022-02-06 DIAGNOSIS — Z751 Person awaiting admission to adequate facility elsewhere: Secondary | ICD-10-CM | POA: Diagnosis not present

## 2022-02-06 LAB — BASIC METABOLIC PANEL
Anion gap: 11 (ref 5–15)
BUN: 14 mg/dL (ref 8–23)
CO2: 22 mmol/L (ref 22–32)
Calcium: 8.5 mg/dL — ABNORMAL LOW (ref 8.9–10.3)
Chloride: 112 mmol/L — ABNORMAL HIGH (ref 98–111)
Creatinine, Ser: 1.73 mg/dL — ABNORMAL HIGH (ref 0.61–1.24)
GFR, Estimated: 41 mL/min — ABNORMAL LOW (ref >60)
Glucose, Bld: 88 mg/dL (ref 70–99)
Potassium: 3.6 mmol/L (ref 3.5–5.1)
Sodium: 145 mmol/L (ref 135–145)

## 2022-02-06 LAB — VITAMIN D 25 HYDROXY (VIT D DEFICIENCY, FRACTURES): Vit D, 25-Hydroxy: 46.77 ng/mL (ref 30–100)

## 2022-02-06 LAB — CBC
HCT: 33.2 % — ABNORMAL LOW (ref 39.0–52.0)
Hemoglobin: 10.7 g/dL — ABNORMAL LOW (ref 13.0–17.0)
MCH: 27.8 pg (ref 26.0–34.0)
MCHC: 32.2 g/dL (ref 30.0–36.0)
MCV: 86.2 fL (ref 80.0–100.0)
Platelets: 135 10*3/uL — ABNORMAL LOW (ref 150–400)
RBC: 3.85 MIL/uL — ABNORMAL LOW (ref 4.22–5.81)
RDW: 16.3 % — ABNORMAL HIGH (ref 11.5–15.5)
WBC: 3.3 10*3/uL — ABNORMAL LOW (ref 4.0–10.5)
nRBC: 0 % (ref 0.0–0.2)

## 2022-02-06 LAB — HEPATIC FUNCTION PANEL
ALT: 27 U/L (ref 0–44)
AST: 87 U/L — ABNORMAL HIGH (ref 15–41)
Albumin: 3.2 g/dL — ABNORMAL LOW (ref 3.5–5.0)
Alkaline Phosphatase: 45 U/L (ref 38–126)
Bilirubin, Direct: <0.1 mg/dL (ref 0.0–0.2)
Total Bilirubin: 0.9 mg/dL (ref 0.3–1.2)
Total Protein: 6.6 g/dL (ref 6.5–8.1)

## 2022-02-06 LAB — CK: Total CK: 7014 U/L — ABNORMAL HIGH (ref 49–397)

## 2022-02-06 LAB — PHOSPHORUS: Phosphorus: 3.5 mg/dL (ref 2.5–4.6)

## 2022-02-06 LAB — MAGNESIUM: Magnesium: 2 mg/dL (ref 1.7–2.4)

## 2022-02-06 MED ORDER — SODIUM CHLORIDE 0.9 % IV SOLN: INTRAVENOUS | Status: DC

## 2022-02-06 NOTE — Progress Notes (Signed)
[facility] Progress Note  Patient: Jeremy [name]    FAO:[id number]  DOA: 02/04/2022     Date of Service: the patient was seen and examined on 02/06/2022  Chief Complaint  Patient presents with   Fatigue   Altered Mental Status   Brief hospital course: Jeremy [name] is a 76 y.o. male with medical history significant of hypertension, hyperlipidemia, seizure disorder, dementia, prostate cancer, who presents to the ED with complaints of altered mental status.  History obtained through chart review due to patient's altered mental status.   Per chart review, patient presented via EMS from assisted living facility where he was experiencing lethargy and fatigue with altered mental status.  He is generally alert and oriented x 3 but is currently only oriented to self.  Per staff at group home, patient was within normal limits yesterday.     At this time, Jeremy [name] states she is unsure why he is in the ER and is unsure how he got here.  He denies any fever, chills, nausea, vomiting, diarrhea, abdominal pain.  He notes that he feels weak.   ED course: On arrival to the ED, patient was hypertensive at 170/71 with a heart rate of 87.  He was saturating at 99% on room air.  Initial workup remarkable for creatinine of 1.98, AST of 86, troponin of 1200 with downtrend to 861, hemoglobin of 10.8 and COVID-19 PCR positive.  Urinalysis with hematuria and ketonuria but no RBCs noted.  CT head and CTA chest with no abnormalities noted.  EDP discussed with cardiology, Dr. Chancy Milroy, who did not recommend initiating heparin infusion.  TRH contacted for admission for acute encephalopathy and NSTEMI.  Consults: Cardiology  Assessment and Plan: * Acute encephalopathy Patient presenting with 1 day history of altered mental status with fatigue of unknown etiology.  Given CK elevation, differential includes postictal state in the setting of known seizure disorder.  Per chart review, his last  seizure was over 1 year ago.  Differential also includes metabolic encephalopathy in the setting of infection (COVID-19), however patient is otherwise asymptomatic from [place].   MRI brain without any evidence of acute abnormalities. - vitamin B12 wnl , TSH 0.9 wnl, RPR NR - Restarted home antiepileptics   Rhabdomyolysis Patient presenting with generalized weakness, lower extremity more so than upper extremity.  CK markedly elevated consistent with rhabdomyolysis.  Etiology uncertain at this time but differential includes breakthrough seizure in the setting of seizure disorder. CK 8343  ---6962 - Trend CK - IV fluid NS 100 ml/hr decreased to prevent from pulmonary edema 2/2 CHF   NSTEMI (non-ST elevated myocardial infarction) [facility]) Patient presenting with troponin elevation up to 1200 with downtrend.  He is asymptomatic with no chest pain currently.  EKG demonstrating new ventricular bigeminy.  EDP discussed with Dr. Humphrey Rolls who is not currently recommending heparin infusion.   - Cardiology consulted; appreciate their recommendations - Start aspirin - Hold home rosuvastatin in the setting of rhabdomyolysis - Stat EKG and troponin if chest pain should occur - Echocardiogram LVEF 40 to 95%, LV systolic function is mildly reduced, global hypokinesis, moderate LV hypertrophy, grade 1 diastolic dysfunction.   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 2D echo as above Continue Cautious hydration Check BNP   Ventricular bigeminy EKG and telemetry with persistent ventricular bigeminy with no prior history of such.  Potassium is within normal limits.     - Telemetry monitoring - Cardiology consulted; appreciate their recommendations - maintain  Mag > 2 - Continue to monitor potassium; maintain above 4   COVID-19 virus infection Patient denies any upper respiratory symptoms, shortness of breath or abdominal symptoms.  Given he is asymptomatic and borderline renal function,  will hold off on any treatment at this time.   - Per CDC guidelines, patient should isolate for 5 days if he remains asymptomatic.  If he should develop symptoms, plan to isolate for 10 days.   Seizures Given acute encephalopathy and rhabdomyolysis, there is concern for breakthrough seizures.  At this time, no seizure activity noted on examination.   -Restart home antiepileptics   Hypertension Per chart review, patient is not currently taking any antihypertensives.  His blood pressure is elevated, however he is asymptomatic at this time.  Will monitor overnight and if remains elevated, would consider starting amlodipine.   Stage 3b chronic kidney disease (CKD) Renal function at baseline at this time. -Continue to monitor renal function while admitted Cr 1.73 stable   Dementia without behavioral disturbance  Patient has a history of dementia but is apparently alert and oriented x 3 at baseline.    Iron deficiency, iron level 23, transferrin saturation 9%, started oral iron supplement.  folate within normal range  - Restart home risperidone tomorrow  Body mass index is 22.99 kg/m.  Interventions:   Diet: Regular diet DVT Prophylaxis: Subcutaneous Lovenox   Advance goals of care discussion: Full code  Family Communication: family was NOT present at bedside, at the time of interview.  The pt provided permission to discuss medical plan with the family. Opportunity was given to ask question and all questions were answered satisfactorily.   Disposition:  Pt is from Home, admitted with encephalopathy, COVID-positive, rhabdomyolysis, non-STEMI, still has elevated CK on IV fluid, which precludes a safe discharge. Discharge to home, when CK will be trending down.  May require 2 to 3 days.  Subjective: No significant events overnight, patient still very lethargic, does not know where is he and what happened to him.  Patient states that he is not able to walk now due to weakness, he lives  alone.  Unable to offer any specific complaints.   Physical Exam: General: NAD.  Lethargic and tired.    Appear in mild distress, affect flat in affect Eyes: PERRLA ENT: Oral Mucosa dry   Neck: no JVD,  Cardiovascular: S1 and S2 Present, no Murmur,  Respiratory: good respiratory effort, Bilateral Air entry equal and Decreased, no Crackles, no wheezes Abdomen: Bowel Sound present, Soft and no tenderness,  Skin: no rashes Extremities: no Pedal edema, no calf tenderness Neurologic: without any new focal findings Gait not checked due to patient safety concerns  Vitals:   02/05/22 2146 02/05/22 2246 02/06/22 0507 02/06/22 0520  BP: (!) 192/80 (!) 165/89 (!) 159/62   Pulse: 84 76 (!) 41   Resp: 16  20   Temp: 99.1 F (37.3 C)  99 F (37.2 C)   TempSrc: Oral  Oral   SpO2: 97%  98%   Weight:    77.2 kg  Height:    '6\' 1"'$  (1.854 m)    Intake/Output Summary (Last 24 hours) at 02/06/2022 1457 Last data filed at 02/06/2022 0900 Gross per 24 hour  Intake 240 ml  Output 500 ml  Net -260 ml   Filed Weights   02/04/22 1456 02/06/22 0520  Weight: 76.9 kg 77.2 kg    Data Reviewed: I have personally reviewed and interpreted daily labs, tele strips, imagings as discussed above. I  reviewed all nursing notes, pharmacy notes, vitals, pertinent old records I have discussed plan of care as described above with RN and patient/family.  CBC: Recent Labs  Lab 02/04/22 1521 02/05/22 0505 02/06/22 0615  WBC 6.3 5.5 3.3*  NEUTROABS 4.8  --   --   HGB 10.8* 10.8* 10.7*  HCT 32.7* 32.9* 33.2*  MCV 86.1 85.7 86.2  PLT 168 155 767*   Basic Metabolic Panel: Recent Labs  Lab 02/04/22 1521 02/04/22 2151 02/05/22 0505 02/06/22 0615  NA 141  --  143 145  K 3.9  --  3.6 3.6  CL 110  --  112* 112*  CO2 22  --  23 22  GLUCOSE 118*  --  100* 88  BUN 16  --  14 14  CREATININE 1.98*  --  1.81* 1.73*  CALCIUM 9.5  --  8.8* 8.5*  MG  --  2.0 1.9 2.0  PHOS  --   --   --  3.5     Studies: No results found.  Scheduled Meds:  aspirin EC  81 mg Oral Daily   enoxaparin (LOVENOX) injection  40 mg Subcutaneous H41P   folic acid  1 mg Oral Daily   lamoTRIgine  25 mg Oral BID   levETIRAcetam  500 mg Oral BID   multivitamin with minerals  1 tablet Oral Daily   risperiDONE  1 mg Oral TID   sodium chloride flush  3 mL Intravenous Q12H   tamsulosin  0.4 mg Oral QPC supper   thiamine  100 mg Oral Daily   Continuous Infusions:  sodium chloride 125 mL/hr at 02/06/22 1032   PRN Meds: acetaminophen **OR** acetaminophen, hydrALAZINE, ondansetron **OR** ondansetron (ZOFRAN) IV, polyethylene glycol  Time spent: 35 minutes  Author: Val Riles. MD [facility] 02/06/2022 2:57 PM  To reach On-call, see care teams to locate the attending and reach out to them via [email or web address]. If 7PM-7AM, please contact night-coverage If you still have difficulty reaching the attending provider, please page the [facility] (Director on Call) for [facility] on amion for assistance.

## 2022-02-06 NOTE — TOC Initial Note (Signed)
Transition of Care [facility]) - Initial/Assessment Note    Patient Details  Name: Jeremy [name] MRN: [id number] Date of Birth: April 28, 1945  [facility] CM/SW Contact:    Beverly Sessions, RN Phone Number: 02/06/2022, 3:13 PM  Clinical Narrative:                    Admitted for: covid [facility] WLN:[id number] Current home health/prior home health/DME: NA  Patient A&O x2. Spoke with Madelynn Done at care home. No family listed.  Madelynn Done states that patient has one brother which he has tried to contact, but was unable to reach  [place] request PT OT eval as patient has been weak at facility.  MD notified Last VS states that patient is on nasal cannula but does not say a liter flow.  Message sent to confirm if patient is on o2  Per Madelynn Done patient will not require quarantine at discharge.  TOC to coordinate with Madelynn Done at discharge       Patient Goals and CMS Choice            Expected Discharge Plan and Services                                                Prior Living Arrangements/Services                       Activities of Daily Living Home Assistive Devices/Equipment: None ADL Screening (condition at time of admission) Patient's cognitive ability adequate to safely complete daily activities?: No Is the patient deaf or have difficulty hearing?: No Does the patient have difficulty seeing, even when wearing glasses/contacts?: No Does the patient have difficulty concentrating, remembering, or making decisions?: Yes Patient able to express need for assistance with ADLs?: Yes Does the patient have difficulty dressing or bathing?: Yes Independently performs ADLs?: No Communication: Independent Dressing (OT): Needs assistance Is this a change from baseline?: Change from baseline, expected to last <3days Grooming: Needs assistance Is this a change from baseline?: Change from baseline, expected to last <3  days Feeding: Independent Bathing: Needs assistance Is this a change from baseline?: Pre-admission baseline Toileting: Needs assistance Is this a change from baseline?: Change from baseline, expected to last <3 days In/Out Bed: Needs assistance Is this a change from baseline?: Change from baseline, expected to last <3 days Walks in Home: Needs assistance Is this a change from baseline?: Change from baseline, expected to last >3 days Does the patient have difficulty walking or climbing stairs?: Yes Weakness of Legs: Both Weakness of Arms/Hands: None  Permission Sought/Granted                  Emotional Assessment              Admission diagnosis:  Dehydration [E86.0] Elevated troponin [R79.89] Acute encephalopathy [G93.40] Non-traumatic rhabdomyolysis [M62.82] Altered mental status, unspecified altered mental status type [R41.82] COVID-19 virus infection [U07.1] Patient Active Problem List   Diagnosis Date Noted   Acute encephalopathy 02/04/2022   Rhabdomyolysis 02/04/2022   Ventricular bigeminy 02/04/2022   NSTEMI (non-ST elevated myocardial infarction) ([place]) 02/04/2022   Stage 3b chronic kidney disease (CKD) ([place]) 02/04/2022   Acute confusion due to infection 09/16/2020   AKI (acute kidney injury) ([place]) 09/16/2020   COVID-19 virus infection 09/15/2020   Prostate  cancer ([place]) 06/11/2018   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 [place] [address] Phone: [phone] Fax: [place], [place] [place] [address] Phone: [phone]  Fax: [phone]     Social Determinants of Health (SDOH) Social History: SDOH Screenings   Tobacco Use: Medium Risk (02/05/2022)   SDOH Interventions:     Readmission Risk Interventions     No data to display

## 2022-02-06 NOTE — Plan of Care (Signed)
  Problem: Health Behavior/Discharge Planning: Goal: Ability to manage health-related needs will improve Outcome: Progressing   Problem: Clinical Measurements: Goal: Ability to maintain clinical measurements within normal limits will improve Outcome: Progressing   Problem: Clinical Measurements: Goal: Will remain free from infection Outcome: Progressing   

## 2022-02-07 DIAGNOSIS — R32 Unspecified urinary incontinence: Secondary | ICD-10-CM | POA: Diagnosis not present

## 2022-02-07 DIAGNOSIS — G9340 Encephalopathy, unspecified: Secondary | ICD-10-CM | POA: Diagnosis not present

## 2022-02-07 LAB — CBC
HCT: 32.8 % — ABNORMAL LOW (ref 39.0–52.0)
Hemoglobin: 11.2 g/dL — ABNORMAL LOW (ref 13.0–17.0)
MCH: 28.9 pg (ref 26.0–34.0)
MCHC: 34.1 g/dL (ref 30.0–36.0)
MCV: 84.5 fL (ref 80.0–100.0)
Platelets: 172 10*3/uL (ref 150–400)
RBC: 3.88 MIL/uL — ABNORMAL LOW (ref 4.22–5.81)
RDW: 16 % — ABNORMAL HIGH (ref 11.5–15.5)
WBC: 4.7 10*3/uL (ref 4.0–10.5)
nRBC: 0 % (ref 0.0–0.2)

## 2022-02-07 LAB — BASIC METABOLIC PANEL
Anion gap: 7 (ref 5–15)
BUN: 14 mg/dL (ref 8–23)
CO2: 22 mmol/L (ref 22–32)
Calcium: 8.5 mg/dL — ABNORMAL LOW (ref 8.9–10.3)
Chloride: 115 mmol/L — ABNORMAL HIGH (ref 98–111)
Creatinine, Ser: 1.48 mg/dL — ABNORMAL HIGH (ref 0.61–1.24)
GFR, Estimated: 49 mL/min — ABNORMAL LOW (ref >60)
Glucose, Bld: 112 mg/dL — ABNORMAL HIGH (ref 70–99)
Potassium: 3.3 mmol/L — ABNORMAL LOW (ref 3.5–5.1)
Sodium: 144 mmol/L (ref 135–145)

## 2022-02-07 LAB — HEPATIC FUNCTION PANEL
ALT: 32 U/L (ref 0–44)
AST: 85 U/L — ABNORMAL HIGH (ref 15–41)
Albumin: 3.4 g/dL — ABNORMAL LOW (ref 3.5–5.0)
Alkaline Phosphatase: 48 U/L (ref 38–126)
Bilirubin, Direct: <0.1 mg/dL (ref 0.0–0.2)
Total Bilirubin: 0.6 mg/dL (ref 0.3–1.2)
Total Protein: 7.1 g/dL (ref 6.5–8.1)

## 2022-02-07 LAB — PHOSPHORUS: Phosphorus: 3 mg/dL (ref 2.5–4.6)

## 2022-02-07 LAB — CK: Total CK: 5560 U/L — ABNORMAL HIGH (ref 49–397)

## 2022-02-07 LAB — MAGNESIUM: Magnesium: 2 mg/dL (ref 1.7–2.4)

## 2022-02-07 MED ORDER — HYDRALAZINE HCL 50 MG PO TABS
50.0000 mg | ORAL_TABLET | Freq: Four times a day (QID) | ORAL | Status: DC | PRN
  Administered 2022-02-07: 50 mg via ORAL
  Filled 2022-02-07: qty 1

## 2022-02-07 MED ORDER — HYDRALAZINE HCL 20 MG/ML IJ SOLN
10.0000 mg | Freq: Once | INTRAMUSCULAR | Status: AC
  Administered 2022-02-07: 10 mg via INTRAVENOUS
  Filled 2022-02-07: qty 1

## 2022-02-07 MED ORDER — SODIUM CHLORIDE 0.9 % IV SOLN
200.0000 mg | Freq: Every day | INTRAVENOUS | Status: AC
  Administered 2022-02-07 – 2022-02-11 (×5): 200 mg via INTRAVENOUS
  Filled 2022-02-07 (×5): qty 200

## 2022-02-07 MED ORDER — SODIUM CHLORIDE 0.45 % IV SOLN: INTRAVENOUS | Status: DC

## 2022-02-07 MED ORDER — CARVEDILOL 6.25 MG PO TABS
6.2500 mg | ORAL_TABLET | Freq: Two times a day (BID) | ORAL | Status: DC
  Administered 2022-02-07 – 2022-02-14 (×14): 6.25 mg via ORAL
  Filled 2022-02-07 (×15): qty 1

## 2022-02-07 MED ORDER — POTASSIUM CHLORIDE CRYS ER 20 MEQ PO TBCR
40.0000 meq | EXTENDED_RELEASE_TABLET | Freq: Once | ORAL | Status: AC
  Administered 2022-02-07: 40 meq via ORAL
  Filled 2022-02-07: qty 2

## 2022-02-07 MED ORDER — LOSARTAN POTASSIUM 25 MG PO TABS
25.0000 mg | ORAL_TABLET | Freq: Every day | ORAL | Status: DC
  Administered 2022-02-07 – 2022-02-14 (×8): 25 mg via ORAL
  Filled 2022-02-07 (×8): qty 1

## 2022-02-07 MED ORDER — HYDRALAZINE HCL 20 MG/ML IJ SOLN: 10.0000 mg | Freq: Four times a day (QID) | INTRAMUSCULAR | Status: DC | PRN

## 2022-02-07 MED ORDER — METOPROLOL TARTRATE 5 MG/5ML IV SOLN
5.0000 mg | Freq: Once | INTRAVENOUS | Status: AC
  Administered 2022-02-07: 5 mg via INTRAVENOUS
  Filled 2022-02-07: qty 5

## 2022-02-07 NOTE — TOC Progression Note (Signed)
Transition of Care [facility]) - Progression Note    Patient Details  Name: Jeremy [name] MRN: [id number] Date of Birth: [birth year]  [facility] CM/SW [place], LCSW Phone Number: 02/07/2022, 2:20 PM  Clinical Narrative:    SNF work up started. Patient would not be able to go to SNF until 10 days from [place] + test on 12/19.         Expected Discharge Plan and Services                                               Social Determinants of Health (SDOH) Interventions SDOH Screenings   Tobacco Use: Medium Risk (02/05/2022)    Readmission Risk Interventions    02/06/2022    3:18 PM  Readmission Risk Prevention Plan  Transportation Screening Complete  Medication Review (RN Care Manager) Complete  SW Recovery Care/Counseling Consult Complete  Palliative Care Screening Not Applicable

## 2022-02-07 NOTE — Evaluation (Signed)
Physical Therapy Evaluation Patient Details Name: Jeremy [name] MRN: [id number] DOB: [birth year] Today's Date: 02/07/2022  History of Present Illness  Pt is a 76 y.o. male presenting to hospital with fatigue and AMS.  Pt admitted with acute encephalopathy, (+) COVID, rhabdomyolysis, NSTEMI, ventricular bigeminy, seizures, and htn.  PMH includes htn, HLD, seizure disorder, dementia, prostate CA, and flexor tendon repair.  Clinical Impression  Prior to hospital admission, pt was independent with household ambulation; lives at a group home.  Currently pt is CGA with transfers and CGA to min assist to ambulate 20 feet with RW use.  Pt requiring assist to navigate walker and distance ambulated limited d/t pt fatigue/generalized weakness.  Pt would benefit from skilled PT to address noted impairments and functional limitations (see below for any additional details).  Per Madelynn Done at pt's group home, they can not provide physical assistance for mobility/walking (pt needs to be able to walk on his own).  D/t this, upon hospital discharge, pt would benefit from SNF.    Recommendations for follow up therapy are one component of a multi-disciplinary discharge planning process, led by the attending physician.  Recommendations may be updated based on patient status, additional functional criteria and insurance authorization.  Follow Up Recommendations Skilled nursing-short term rehab (<3 hours/day)      Assistance Recommended at Discharge Frequent or constant Supervision/Assistance  Patient can return home with the following  A little help with walking and/or transfers;A little help with bathing/dressing/bathroom;Assistance with cooking/housework;Direct supervision/assist for medications management;Assist for transportation;Help with stairs or ramp for entrance    Equipment Recommendations Rolling walker (2 wheels);BSC/3in1  Recommendations for Other Services  OT consult    Functional Status  Assessment Patient has had a recent decline in their functional status and demonstrates the ability to make significant improvements in function in a reasonable and predictable amount of time.     Precautions / Restrictions Precautions Precautions: Fall Precaution Comments: Seizure precautions Restrictions Weight Bearing Restrictions: No      Mobility  Bed Mobility               General bed mobility comments: Deferred (pt in recliner beginning/end of session)    Transfers Overall transfer level: Needs assistance Equipment used: None Transfers: Sit to/from Stand Sit to Stand: Min guard           General transfer comment: increased effort to stand (x2 trials)    Ambulation/Gait Ambulation/Gait assistance: Min guard, Min assist Gait Distance (Feet): 20 Feet Assistive device: Rolling walker (2 wheels)   Gait velocity: decreased     General Gait Details: decreased B LE step length; assist to navigate walker in room  Stairs            Wheelchair Mobility    Modified Rankin (Stroke Patients Only)       Balance Overall balance assessment: Needs assistance Sitting-balance support: No upper extremity supported, Feet supported Sitting balance-Leahy Scale: Good Sitting balance - Comments: steady sitting reaching within BOS   Standing balance support: Single extremity supported Standing balance-Leahy Scale: Fair Standing balance comment: steady standing with at least single UE support                             Pertinent Vitals/Pain Pain Assessment Pain Assessment: No/denies pain Vitals (HR and O2 on room air) stable and WFL throughout treatment session.    Home Living Family/patient expects to be discharged to:: Group home  Additional Comments: "[facility] from group home verified on phone" per OT eval    Prior Function Prior Level of Function : Needs assist  Cognitive Assist : ADLs (cognitive)   ADLs  (Cognitive): Set up cues Physical Assist : ADLs (physical)     Mobility Comments: Ambulates household distance (no AD use).  Per OT (who spoke with Madelynn Done at facility) pt does not get out much. ADLs Comments: Per OT eval "pt has supervision for all ADL, assist for all IADL; He is able to dress himself with SET UP, supervision for bathing seated in a shower chair, feeding/grooming with supervision".     Hand Dominance        Extremity/Trunk Assessment   Upper Extremity Assessment Upper Extremity Assessment: Generalized weakness    Lower Extremity Assessment Lower Extremity Assessment: Generalized weakness       Communication   Communication: No difficulties  Cognition Arousal/Alertness: Awake/alert Behavior During Therapy: WFL for tasks assessed/performed Overall Cognitive Status: Impaired/Different from baseline Area of Impairment: Orientation, Attention, Memory, Following commands, Safety/judgement, Awareness, Problem solving                 Orientation Level: Disoriented to, Time (Dec 23[birth year] 1924) Current Attention Level: Focused Memory: Decreased short-term memory Following Commands: Follows one step commands with increased time Safety/Judgement: Decreased awareness of deficits Awareness: Intellectual Problem Solving: Slow processing, Requires verbal cues, Requires tactile cues, Decreased initiation General Comments: Baseline cognitive deficits        General Comments Pt agreeable to PT session.    Exercises  Gait training with walker.   Assessment/Plan    PT Assessment Patient needs continued PT services  PT Problem List Decreased strength;Decreased activity tolerance;Decreased balance;Decreased mobility;Decreased knowledge of use of DME;Decreased knowledge of precautions;Decreased safety awareness       PT Treatment Interventions DME instruction;Gait training;Functional mobility training;Therapeutic activities;Therapeutic exercise;Balance  training;Patient/family education    PT Goals (Current goals can be found in the Care Plan section)  Acute Rehab PT Goals Patient Stated Goal: to improve strength and mobility PT Goal Formulation: With patient Time For Goal Achievement: 02/21/22 Potential to Achieve Goals: Good    Frequency Min 2X/week     Co-evaluation               AM-PAC PT "6 Clicks" Mobility  Outcome Measure Help needed turning from your back to your side while in a flat bed without using bedrails?: None Help needed moving from lying on your back to sitting on the side of a flat bed without using bedrails?: A Little Help needed moving to and from a bed to a chair (including a wheelchair)?: A Little Help needed standing up from a chair using your arms (e.g., wheelchair or bedside chair)?: A Little Help needed to walk in hospital room?: A Little Help needed climbing 3-5 steps with a railing? : A Little 6 Click Score: 19    End of Session Equipment Utilized During Treatment: Gait belt Activity Tolerance: Patient limited by fatigue Patient left: in chair;with call bell/phone within reach;with chair alarm set Nurse Communication: Mobility status;Precautions PT Visit Diagnosis: Unsteadiness on feet (R26.81);Muscle weakness (generalized) (M62.81);Other abnormalities of gait and mobility (R26.89)    Time: 8299-3716 PT Time Calculation (min) (ACUTE ONLY): 29 min   Charges:   PT Evaluation $PT Eval Low Complexity: 1 Low PT Treatments $Gait Training: 8-22 mins       Leitha Bleak, PT 02/07/22, 1:35 PM

## 2022-02-07 NOTE — Progress Notes (Addendum)
[facility] Progress Note  Patient: Jeremy [name]    XLK:[id number]  DOA: 02/04/2022     Date of Service: the patient was seen and examined on 02/07/2022  Chief Complaint  Patient presents with   Fatigue   Altered Mental Status   Brief [name] course: Jeremy [name] is a 76 y.o. male with medical history significant of hypertension, hyperlipidemia, seizure disorder, dementia, prostate cancer, who presents to the ED with complaints of altered mental status.  History obtained through chart review due to patient's altered mental status.   Per chart review, patient presented via EMS from assisted living facility where he was experiencing lethargy and fatigue with altered mental status.  He is generally alert and oriented x 3 but is currently only oriented to self.  Per staff at group home, patient was within normal limits yesterday.     At this time, Jeremy [name] states she is unsure why he is in the ER and is unsure how he got here.  He denies any fever, chills, nausea, vomiting, diarrhea, abdominal pain.  He notes that he feels weak.   ED course: On arrival to the ED, patient was hypertensive at 170/71 with a heart rate of 87.  He was saturating at 99% on room air.  Initial workup remarkable for creatinine of 1.98, AST of 86, troponin of 1200 with downtrend to 861, hemoglobin of 10.8 and COVID-19 PCR positive.  Urinalysis with hematuria and ketonuria but no RBCs noted.  CT head and CTA chest with no abnormalities noted.  EDP discussed with cardiology, Jeremy [name], who did not recommend initiating heparin infusion.  TR[facility] contacted for admission for acute encephalopathy and NSTEMI.  Consults: Cardiology  Assessment and Plan: * Acute encephalopathy Patient presenting with 1 day history of altered mental status with fatigue of unknown etiology.  Given CK elevation, differential includes postictal state in the setting of known seizure disorder.  Per chart review, his last  seizure was over 1 year ago.  Differential also includes metabolic encephalopathy in the setting of infection (COVID-19), however patient is otherwise asymptomatic from [place].   MRI brain without any evidence of acute abnormalities. - vitamin B12 wnl , TSH 0.9 wnl, RPR NR - Restarted home antiepileptics   Rhabdomyolysis Patient presenting with generalized weakness, lower extremity more so than upper extremity.  CK markedly elevated consistent with rhabdomyolysis.  Etiology uncertain at this time but differential includes breakthrough seizure in the setting of seizure disorder. CK 8343  ---3664--4034 - Trend CK - Changed IV 0.45 saline 75/hr    NSTEMI (non-ST elevated myocardial infarction) Jeremy [name]) Patient presenting with troponin elevation up to 1200 with downtrend.  He is asymptomatic with no chest pain currently.  EKG demonstrating new ventricular bigeminy.  EDP discussed with Jeremy [name] who is not currently recommending heparin infusion.   - Cardiology consulted; appreciate their recommendations - Start aspirin - Hold home rosuvastatin in the setting of rhabdomyolysis - Stat EKG and troponin if chest pain should occur - Echocardiogram LVEF 40 to 74%, LV systolic function is mildly reduced, global hypokinesis, moderate LV hypertrophy, grade 1 diastolic dysfunction.   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 2D echo as above Started Coreg 6.25 mg p.o. twice daily and losartan 25 mg p.o. daily Continue Cautious hydration BNP 358 slightly elevated    Hypertension Patient is not on any medication at home.,  Started Coreg 6.25 mg p.o. twice daily and losartan 25 mg p.o. daily We will continue to monitor  BP and titrate medications accordingly.   Ventricular bigeminy EKG and telemetry with persistent ventricular bigeminy with no prior history of such.  Potassium is within normal limits.     - Telemetry monitoring - Cardiology consulted; appreciate their  recommendations - maintain  Mag > 2 - Continue to monitor potassium; maintain above 4   COVID-19 virus infection Patient denies any upper respiratory symptoms, shortness of breath or abdominal symptoms.  Given he is asymptomatic and borderline renal function, will hold off on any treatment at this time.   - Per CDC guidelines, patient should isolate for 5 days if he remains asymptomatic.  If he should develop symptoms, plan to isolate for 10 days.   Seizures Given acute encephalopathy and rhabdomyolysis, there is concern for breakthrough seizures.  At this time, no seizure activity noted on examination. -Restart home antiepileptics     Stage 3b chronic kidney disease (CKD) Renal function at baseline at this time. -Continue to monitor renal function while admitted Cr 1.73--1.48 improved    Dementia without behavioral disturbance  Patient has a history of dementia but is apparently alert and oriented x 3 at baseline. - Restart home risperidone    Iron deficiency, iron level 23, transferrin saturation 9%.  Started Venofer 20 mg IV daily for 5 days.  Followed by oral iron supplement.  folate within normal range   Body mass index is 22.99 kg/m.  Interventions:   Diet: Regular diet DVT Prophylaxis: Subcutaneous Lovenox   Advance goals of care discussion: Full code  Family Communication: family was NOT present at bedside, at the time of interview.  The pt provided permission to discuss medical plan with the family. Opportunity was given to ask question and all questions were answered satisfactorily.   Disposition:  Pt is from group Home, admitted with encephalopathy, COVID-positive, rhabdomyolysis, non-STEMI, still has elevated CK on IV fluid, which precludes a safe discharge. Discharge to SNF, when CK will be trending down.  May require 2 to 3 days.  Subjective: No significant events overnight, today patient is more awake and alert, he was sitting on the bedside chair eating  breakfast.  Aox4, he feels improvement 75% but he still feels generalized weakness.  Denies any shortness of breath, no chest pain or palpitation, no any other active issues.    Physical Exam: General: NAD.  AAOX 4, awake and alert Appear in mild distress, affect flat in affect Eyes: PERRLA ENT: Oral Mucosa dry   Neck: no JVD,  Cardiovascular: S1 and S2 Present, no Murmur,  Respiratory: good respiratory effort, Bilateral Air entry equal and Decreased, no Crackles, no wheezes Abdomen: Bowel Sound present, Soft and no tenderness,  Skin: no rashes Extremities: no Pedal edema, no calf tenderness Neurologic: without any new focal findings Gait not checked due to patient safety concerns  Vitals:   02/07/22 0132 02/07/22 0145 02/07/22 0333 02/07/22 0606  BP: (!) 176/88  (!) 183/81 (!) 166/82  Pulse:  94 71   Resp:   20   Temp:   98.2 F (36.8 C)   TempSrc:      SpO2:   99%   Weight:      Height:        Intake/Output Summary (Last 24 hours) at 02/07/2022 1335 Last data filed at 02/07/2022 1202 Gross per 24 hour  Intake 2310.37 ml  Output 1600 ml  Net 710.37 ml   Filed Weights   02/04/22 1456 02/06/22 0520  Weight: 76.9 kg 77.2 kg    Data  Reviewed: I have personally reviewed and interpreted daily labs, tele strips, imagings as discussed above. I reviewed all nursing notes, pharmacy notes, vitals, pertinent old records I have discussed plan of care as described above with RN and patient/family.  CBC: Recent Labs  Lab 02/04/22 1521 02/05/22 0505 02/06/22 0615 02/07/22 0623  WBC 6.3 5.5 3.3* 4.7  NEUTROABS 4.8  --   --   --   HGB 10.8* 10.8* 10.7* 11.2*  HCT 32.7* 32.9* 33.2* 32.8*  MCV 86.1 85.7 86.2 84.5  PLT 168 155 135* 659   Basic Metabolic Panel: Recent Labs  Lab 02/04/22 1521 02/04/22 2151 02/05/22 0505 02/06/22 0615 02/07/22 0623  NA 141  --  143 145 144  K 3.9  --  3.6 3.6 3.3*  CL 110  --  112* 112* 115*  CO2 22  --  '23 22 22  '$ GLUCOSE 118*  --   100* 88 112*  BUN 16  --  '14 14 14  '$ CREATININE 1.98*  --  1.81* 1.73* 1.48*  CALCIUM 9.5  --  8.8* 8.5* 8.5*  MG  --  2.0 1.9 2.0 2.0  PHOS  --   --   --  3.5 3.0    Studies: No results found.  Scheduled Meds:  aspirin EC  81 mg Oral Daily   carvedilol  6.25 mg Oral BID WC   enoxaparin (LOVENOX) injection  40 mg Subcutaneous D35T   folic acid  1 mg Oral Daily   lamoTRIgine  25 mg Oral BID   levETIRAcetam  500 mg Oral BID   losartan  25 mg Oral Daily   multivitamin with minerals  1 tablet Oral Daily   risperiDONE  1 mg Oral TID   sodium chloride flush  3 mL Intravenous Q12H   tamsulosin  0.4 mg Oral QPC supper   thiamine  100 mg Oral Daily   Continuous Infusions:  sodium chloride 100 mL/hr at 02/07/22 1042   iron sucrose 200 mg (02/07/22 1334)   PRN Meds: acetaminophen **OR** acetaminophen, hydrALAZINE, hydrALAZINE, ondansetron **OR** ondansetron (ZOFRAN) IV, polyethylene glycol  Time spent: 50 minutes  Author: Val Riles. MD [facility] 02/07/2022 1:35 PM  To reach On-call, see care teams to locate the attending and reach out to them via [email or web address]. If 7PM-7AM, please contact night-coverage If you still have difficulty reaching the attending provider, please page the [place] [name] (Director on Call) for [facility] on amion for assistance.

## 2022-02-07 NOTE — Plan of Care (Signed)

## 2022-02-07 NOTE — NC FL2 (Signed)
[place] LEVEL OF CARE FORM     IDENTIFICATION  Patient Name: Jeremy [name] Birthdate: 03-20-1945 Sex: male Admission Date (Current Location): 02/04/2022  [facility] and [place] Number:  Engineering geologist and Address:  [facility], [address] [place], [place] [address]      Provider Number: [id number]  Attending Physician Name and Address:  Val Riles, MD  Relative Name and Phone Number:  [place],[name] Denman George) [phone] (Mobile)    Current Level of Care: Hospital Recommended Level of Care: [place] Prior Approval Number:    Date Approved/Denied:   PASRR Number: [id number]  Discharge Plan:      Current Diagnoses: Patient Active Problem List   Diagnosis Date Noted   Acute encephalopathy 02/04/2022   Rhabdomyolysis 02/04/2022   Ventricular bigeminy 02/04/2022   NSTEMI (non-ST elevated myocardial infarction) ([place]) 02/04/2022   Stage 3b chronic kidney disease (CKD) ([place]) 02/04/2022   Acute confusion due to infection 09/16/2020   AKI (acute kidney injury) ([place]) 09/16/2020   COVID-19 virus infection 09/15/2020   Prostate cancer ([place]) 06/11/2018   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   Dementia without behavioral disturbance ([place]) 11/02/2015   Hypertension 11/02/2015   Seizures ([place]) 11/02/2015   Incarcerated hernia    Crack cocaine use 12/19/2013   Chronic tension-type headache, not intractable 08/29/2013   Memory loss 08/29/2013   Numbness 08/29/2013    Orientation RESPIRATION BLADDER Height & Weight     Self, Place  Normal External catheter, Incontinent Weight: 170 lb 3.1 oz (77.2 kg) Height:  '6\' 1"'$  (185.4 cm)  BEHAVIORAL SYMPTOMS/MOOD NEUROLOGICAL BOWEL NUTRITION STATUS      Continent Diet (regular)  AMBULATORY STATUS COMMUNICATION OF NEEDS Skin   Limited Assist Verbally Normal                       Personal Care Assistance Level of Assistance  Bathing, Dressing, Feeding  Bathing Assistance: Limited assistance Feeding assistance: Limited assistance Dressing Assistance: Limited assistance     Functional Limitations Info             SPECIAL CARE FACTORS FREQUENCY  PT (By licensed PT), OT (By licensed OT)     PT Frequency: 5 times per week OT Frequency: 5 times per week            Contractures      Additional Factors Info  Code Status, Allergies Code Status Info: full Allergies Info: NKA           Current Medications (02/07/2022):  This is the current hospital active medication list Current Facility-Administered Medications  Medication Dose Route Frequency Provider Last Rate Last Admin   0.45 % sodium chloride infusion   Intravenous Continuous Val Riles, MD 75 mL/hr at 02/07/22 1345 Rate Change at 02/07/22 1345   acetaminophen (TYLENOL) tablet 650 mg  650 mg Oral Q6H PRN Jose Persia, MD       Or   acetaminophen (TYLENOL) suppository 650 mg  650 mg Rectal Q6H PRN Jose Persia, MD       aspirin EC tablet 81 mg  81 mg Oral Daily Jose Persia, MD   81 mg at 02/07/22 1025   carvedilol (COREG) tablet 6.25 mg  6.25 mg Oral BID WC Val Riles, MD   6.25 mg at 02/07/22 1025   enoxaparin (LOVENOX) injection 40 mg  40 mg Subcutaneous Q24H Jose Persia, MD   40 mg at 17/71/16 5790   folic acid (FOLVITE) tablet  1 mg  1 mg Oral Daily Jose Persia, MD   1 mg at 02/07/22 1025   hydrALAZINE (APRESOLINE) injection 10 mg  10 mg Intravenous Q6H PRN Val Riles, MD       hydrALAZINE (APRESOLINE) tablet 50 mg  50 mg Oral Q6H PRN Val Riles, MD       iron sucrose (VENOFER) 200 mg in sodium chloride 0.9 % 100 mL IVPB  200 mg Intravenous Q1200 Val Riles, MD 440 mL/hr at 02/07/22 1334 200 mg at 02/07/22 1334   lamoTRIgine (LAMICTAL) tablet 25 mg  25 mg Oral BID Jose Persia, MD   25 mg at 02/07/22 1025   levETIRAcetam (KEPPRA) tablet 500 mg  500 mg Oral BID Jose Persia, MD   500 mg at 02/07/22 1025   losartan (COZAAR) tablet  25 mg  25 mg Oral Daily Val Riles, MD   25 mg at 02/07/22 1025   multivitamin with minerals tablet 1 tablet  1 tablet Oral Daily Jose Persia, MD   1 tablet at 02/07/22 1025   ondansetron (ZOFRAN) tablet 4 mg  4 mg Oral Q6H PRN Jose Persia, MD       Or   ondansetron (ZOFRAN) injection 4 mg  4 mg Intravenous Q6H PRN Jose Persia, MD       polyethylene glycol (MIRALAX / GLYCOLAX) packet 17 g  17 g Oral Daily PRN Jose Persia, MD       risperiDONE (RISPERDAL) tablet 1 mg  1 mg Oral TID Jose Persia, MD   1 mg at 02/07/22 1027   sodium chloride flush (NS) 0.9 % injection 3 mL  3 mL Intravenous Q12H Jose Persia, MD   3 mL at 02/07/22 1027   tamsulosin (FLOMAX) capsule 0.4 mg  0.4 mg Oral QPC supper Jose Persia, MD   0.4 mg at 02/06/22 1813   thiamine (VITAMIN B1) tablet 100 mg  100 mg Oral Daily Jose Persia, MD   100 mg at 02/07/22 1025     Discharge Medications: Please see discharge summary for a list of discharge medications.  Relevant Imaging Results:  Relevant Lab Results:   Additional Information SS#: [id number] ; Seeking bed for after COVID quarantine, COVID + 02/04/22  Inara Dike E Lahari Suttles, LCSW

## 2022-02-07 NOTE — Evaluation (Addendum)
Occupational Therapy Evaluation Patient Details Name: Jeremy [name] MRN: [id number] DOB: 12/31/1945 Today's Date: 02/07/2022   History of Present Illness Pt is a 75 year old male admitted admitted with encephalopathy, COVID-positive, rhabdomyolysis, non-STEMI, still has elevated CK on IV fluid; PMH significant for  hypertension, hyperlipidemia, seizure disorder, dementia, prostate cancer   Clinical Impression   Chart reviewed, pt greeted in bed agreeable to OT evaluation. Pt is oriented to self, place, situation; oriented to month/day not oriented to year. Pt has baseline cognitive deficits however per chart was altered compared to baseline upon admission. Pt requires increased time for processing, is a poor historian. Madelynn Done, house admin contacted and endorses pt has assist for all ADL/IADL if he needs it, is typically able to dress himself, feed/perform grooming tasks; supervision for showering with a shower chair. Pt typically amb household distances with out AD. Pt presents with deficits in strength, endurance,a ctivity tolerance, cognition, balance affecting safe and optimal ADL completion. Recommend discharge home with [place], OT will continue to follow acutely.      Recommendations for follow up therapy are one component of a multi-disciplinary discharge planning process, led by the attending physician.  Recommendations may be updated based on patient status, additional functional criteria and insurance authorization.   Follow Up Recommendations  Home health OT     Assistance Recommended at Discharge Frequent or constant Supervision/Assistance  Patient can return home with the following A little help with walking and/or transfers;A little help with bathing/dressing/bathroom;Assistance with cooking/housework;Direct supervision/assist for medications management;Direct supervision/assist for financial management    Functional Status Assessment  Patient has had a recent decline in  their functional status and demonstrates the ability to make significant improvements in function in a reasonable and predictable amount of time.  Equipment Recommendations  2WW pending progress   Recommendations for Other Services       Precautions / Restrictions        Mobility Bed Mobility Overal bed mobility: Needs Assistance Bed Mobility: Supine to Sit     Supine to sit: Supervision, HOB elevated          Transfers Overall transfer level: Needs assistance Equipment used: None Transfers: Sit to/from Stand Sit to Stand: Min guard                  Balance Overall balance assessment: Needs assistance Sitting-balance support: Feet supported Sitting balance-Leahy Scale: Good     Standing balance support: Single extremity supported Standing balance-Leahy Scale: Fair                             ADL either performed or assessed with clinical judgement   ADL Overall ADL's : Needs assistance/impaired Eating/Feeding: Set up;Sitting   Grooming: Wash/dry face;Set up;Sitting;Cueing for sequencing       Lower Body Bathing: Maximal assistance;Sit to/from stand   Upper Body Dressing : Minimal assistance;Sitting Upper Body Dressing Details (indicate cue type and reason): gown Lower Body Dressing: Maximal assistance Lower Body Dressing Details (indicate cue type and reason): socks Toilet Transfer: Minimal assistance Toilet Transfer Details (indicate cue type and reason): short amb transfer approx 8' with HHA, intermittent vcs for safety in a novel environment         Functional mobility during ADLs: Minimal assistance (with HHA, anticipate supervision-CGA with RW)       Vision Patient Visual Report: No change from baseline       Perception     Praxis  Pertinent Vitals/Pain Pain Assessment Pain Assessment: No/denies pain     Hand Dominance     Extremity/Trunk Assessment Upper Extremity Assessment Upper Extremity Assessment:  Generalized weakness   Lower Extremity Assessment Lower Extremity Assessment: Generalized weakness       Communication Communication Communication: No difficulties   Cognition Arousal/Alertness: Awake/alert Behavior During Therapy: WFL for tasks assessed/performed Overall Cognitive Status: Impaired/Different from baseline Area of Impairment: Orientation, Attention, Memory, Following commands, Safety/judgement, Awareness, Problem solving                 Orientation Level: Disoriented to, Time (February 07 1922) Current Attention Level: Focused Memory: Decreased short-term memory Following Commands: Follows one step commands with increased time Safety/Judgement: Decreased awareness of deficits Awareness: Intellectual Problem Solving: Slow processing, Requires verbal cues, Requires tactile cues, Decreased initiation General Comments: baseline cognitive deficits     General Comments  BP 186/95, HR 90, spo2 100% on RA; Nurse notified of readings    Exercises Other Exercises Other Exercises: edu pt re: role of OT, role of rehab, discharge recommendations, home safety, DME use   Shoulder Instructions      Home Living Family/patient expects to be discharged to:: Group home                                 Additional Comments: [place] from group home verified on phone      Prior Functioning/Environment Prior Level of Function : Needs assist  Cognitive Assist : ADLs (cognitive)   ADLs (Cognitive): Set up cues Physical Assist : ADLs (physical)     Mobility Comments: amb with no AD household distances, Madelynn Done reports he does not get out much ADLs Comments: pt has supervision for all ADL, assist for all IADL; He is able to dress himself with SET UP, supervision for bathing seated in a shower chair, feeding/grooming with supervision        OT Problem List: Decreased strength;Decreased activity tolerance;Impaired balance (sitting and/or standing);Decreased  cognition;Decreased knowledge of use of DME or AE      OT Treatment/Interventions: Self-care/ADL training;Patient/family education;Therapeutic exercise;Balance training;Energy conservation;Therapeutic activities;DME and/or AE instruction    OT Goals(Current goals can be found in the care plan section) Acute Rehab OT Goals Patient Stated Goal: feel stronger OT Goal Formulation: With patient Time For Goal Achievement: 02/20/22 Potential to Achieve Goals: Good  OT Frequency: Min 2X/week    Co-evaluation              AM-PAC OT "6 Clicks" Daily Activity     Outcome Measure Help from another person eating meals?: None Help from another person taking care of personal grooming?: A Little Help from another person toileting, which includes using toliet, bedpan, or urinal?: A Lot Help from another person bathing (including washing, rinsing, drying)?: A Lot Help from another person to put on and taking off regular upper body clothing?: A Little Help from another person to put on and taking off regular lower body clothing?: A Lot 6 Click Score: 16   End of Session Nurse Communication: Mobility status;Other (comment) (vital signs; NT aware of pt status)  Activity Tolerance: Patient tolerated treatment well Patient left: in chair;with call bell/phone within reach;with chair alarm set  OT Visit Diagnosis: Unsteadiness on feet (R26.81);Muscle weakness (generalized) (M62.81)                Time: 1025-8527 OT Time Calculation (min): 35 min Charges:  OT General Charges $  OT Visit: 1 Visit OT Evaluation $OT Eval Moderate Complexity: 1 Mod  Shanon Payor, OTD OTR/L  02/07/22, 10:18 AM

## 2022-02-08 DIAGNOSIS — G9340 Encephalopathy, unspecified: Secondary | ICD-10-CM | POA: Diagnosis not present

## 2022-02-08 LAB — CBC
HCT: 30.3 % — ABNORMAL LOW (ref 39.0–52.0)
Hemoglobin: 10.3 g/dL — ABNORMAL LOW (ref 13.0–17.0)
MCH: 28.9 pg (ref 26.0–34.0)
MCHC: 34 g/dL (ref 30.0–36.0)
MCV: 84.9 fL (ref 80.0–100.0)
Platelets: 176 10*3/uL (ref 150–400)
RBC: 3.57 MIL/uL — ABNORMAL LOW (ref 4.22–5.81)
RDW: 16.1 % — ABNORMAL HIGH (ref 11.5–15.5)
WBC: 3.3 10*3/uL — ABNORMAL LOW (ref 4.0–10.5)
nRBC: 0 % (ref 0.0–0.2)

## 2022-02-08 LAB — BASIC METABOLIC PANEL
Anion gap: 9 (ref 5–15)
BUN: 12 mg/dL (ref 8–23)
CO2: 22 mmol/L (ref 22–32)
Calcium: 7.9 mg/dL — ABNORMAL LOW (ref 8.9–10.3)
Chloride: 111 mmol/L (ref 98–111)
Creatinine, Ser: 1.53 mg/dL — ABNORMAL HIGH (ref 0.61–1.24)
GFR, Estimated: 47 mL/min — ABNORMAL LOW (ref >60)
Glucose, Bld: 90 mg/dL (ref 70–99)
Potassium: 3.7 mmol/L (ref 3.5–5.1)
Sodium: 142 mmol/L (ref 135–145)

## 2022-02-08 LAB — HEPATIC FUNCTION PANEL
ALT: 24 U/L (ref 0–44)
AST: 50 U/L — ABNORMAL HIGH (ref 15–41)
Albumin: 3.1 g/dL — ABNORMAL LOW (ref 3.5–5.0)
Alkaline Phosphatase: 41 U/L (ref 38–126)
Bilirubin, Direct: <0.1 mg/dL (ref 0.0–0.2)
Total Bilirubin: 0.8 mg/dL (ref 0.3–1.2)
Total Protein: 6.1 g/dL — ABNORMAL LOW (ref 6.5–8.1)

## 2022-02-08 LAB — PHOSPHORUS: Phosphorus: 3.2 mg/dL (ref 2.5–4.6)

## 2022-02-08 LAB — MAGNESIUM: Magnesium: 2.1 mg/dL (ref 1.7–2.4)

## 2022-02-08 LAB — CK: Total CK: 2792 U/L — ABNORMAL HIGH (ref 49–397)

## 2022-02-08 MED ORDER — ENSURE ENLIVE PO LIQD
237.0000 mL | Freq: Three times a day (TID) | ORAL | Status: DC
  Administered 2022-02-08 – 2022-02-14 (×16): 237 mL via ORAL

## 2022-02-08 NOTE — Progress Notes (Signed)
[facility] Progress Note  Patient: Jeremy [name]    ZOX:[id number]  DOA: 02/04/2022     Date of Service: the patient was seen and examined on 02/08/2022  Chief Complaint  Patient presents with   Fatigue   Altered Mental Status   Brief hospital course: Jeremy [name] is a 75 y.o. male with medical history significant of hypertension, hyperlipidemia, seizure disorder, dementia, prostate cancer, who presents to the ED with complaints of altered mental status.  History obtained through chart review due to patient's altered mental status.   Per chart review, patient presented via EMS from assisted living facility where he was experiencing lethargy and fatigue with altered mental status.  He is generally alert and oriented x 3 but is currently only oriented to self.  Per staff at group home, patient was within normal limits yesterday.     At this time, Jeremy [name] states she is unsure why he is in the ER and is unsure how he got here.  He denies any fever, chills, nausea, vomiting, diarrhea, abdominal pain.  He notes that he feels weak.   ED course: On arrival to the ED, patient was hypertensive at 170/71 with a heart rate of 87.  He was saturating at 99% on room air.  Initial workup remarkable for creatinine of 1.98, AST of 86, troponin of 1200 with downtrend to 861, hemoglobin of 10.8 and COVID-19 PCR positive.  Urinalysis with hematuria and ketonuria but no RBCs noted.  CT head and CTA chest with no abnormalities noted.  EDP discussed with cardiology, Dr. Chancy Milroy, who did not recommend initiating heparin infusion.  [facility] contacted for admission for acute encephalopathy and NSTEMI.  Consults: Cardiology  Assessment and Plan: * Acute encephalopathy Patient presenting with 1 day history of altered mental status with fatigue of unknown etiology.  Given CK elevation, differential includes postictal state in the setting of known seizure disorder.  Per chart review, his last  seizure was over 1 year ago.  Differential also includes metabolic encephalopathy in the setting of infection (COVID-19), however patient is otherwise asymptomatic from [place].   MRI brain without any evidence of acute abnormalities. - vitamin B12 wnl , TSH 0.9 wnl, RPR NR - Restarted home antiepileptics 12/23 encephalopathy resolved, patient is back to his baseline   Rhabdomyolysis Patient presenting with generalized weakness, lower extremity more so than upper extremity.  CK markedly elevated consistent with rhabdomyolysis.  Etiology uncertain at this time but differential includes breakthrough seizure in the setting of seizure disorder. CK 8343  ---7014--5560--2792 - Trend CK - Changed IV 0.45 saline 75/hr    NSTEMI (non-ST elevated myocardial infarction) [facility]) Patient presenting with troponin elevation up to 1200 with downtrend.  He is asymptomatic with no chest pain currently.  EKG demonstrating new ventricular bigeminy.  EDP discussed with Dr. Humphrey Rolls who is not currently recommending heparin infusion.   - Cardiology consulted; appreciate their recommendations - Start aspirin - Hold home rosuvastatin in the setting of rhabdomyolysis - Stat EKG and troponin if chest pain should occur - Echocardiogram LVEF 40 to 81%, LV systolic function is mildly reduced, global hypokinesis, moderate LV hypertrophy, grade 1 diastolic dysfunction.   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 2D echo as above, LVEF 40 to 45% Started Coreg 6.25 mg p.o. twice daily and losartan 25 mg p.o. daily Continue Cautious hydration BNP 358 slightly elevated    Hypertension Patient is not on any medication at home.,  Started Coreg 6.25 mg p.o.  twice daily and losartan 25 mg p.o. daily We will continue to monitor BP and titrate medications accordingly.   Ventricular bigeminy EKG and telemetry with persistent ventricular bigeminy with no prior history of such.  Potassium is within normal  limits.     - Telemetry monitoring - Cardiology consulted; demand ischemia, recommended no intervention. - maintain  Mag > 2 - Continue to monitor potassium; maintain above 4   COVID-19 virus infection Patient denies any upper respiratory symptoms, shortness of breath or abdominal symptoms.  Given he is asymptomatic and borderline renal function, will hold off on any treatment at this time.  - Per CDC guidelines, patient should isolate for 5 days if he remains asymptomatic.  If he should develop symptoms, plan to isolate for 10 days.    Seizures Given acute encephalopathy and rhabdomyolysis, there is concern for breakthrough seizures.  At this time, no seizure activity noted on examination. -Restart home antiepileptics     Stage 3b chronic kidney disease (CKD) Creatinine was slightly elevated due to dehydration -Continue to monitor renal function Cr 1.81--1.53 improved     Dementia without behavioral disturbance  Patient has a history of dementia but is apparently alert and oriented x 3 at baseline. - Restart home risperidone    Iron deficiency, iron level 23, transferrin saturation 9%.  Started Venofer 200 mg IV daily for 5 days.  Followed by oral iron supplement.  folate within normal range   Body mass index is 22.99 kg/m.  Interventions:   Diet: Regular diet DVT Prophylaxis: Subcutaneous Lovenox   Advance goals of care discussion: Full code  Family Communication: family was NOT present at bedside, at the time of interview.  The pt provided permission to discuss medical plan with the family. Opportunity was given to ask question and all questions were answered satisfactorily.   Disposition:  Pt is from group Home, admitted with encephalopathy, COVID-positive, rhabdomyolysis, non-STEMI, still has elevated CK on IV fluid, which precludes a safe discharge. Discharge to SNF vs Home w/HH, when CK will be trending down.  May require 2 to 3 days.  Subjective: No significant  events overnight, patient is AOx4, feels improvement but is still has mild generalized weakness, no any other specific complaints.  Patient was explained regarding rhabdomyolysis and being on IV fluid.  Patient may require few more days.  Patient stated that he would like to go home and does not want to go to SNF.   Physical Exam: General: NAD.  AAOX 4, awake and alert Appear in mild distress, affect flat in affect Eyes: PERRLA ENT: Oral Mucosa dry   Neck: no JVD,  Cardiovascular: S1 and S2 Present, no Murmur,  Respiratory: good respiratory effort, Bilateral Air entry equal and Decreased, no Crackles, no wheezes Abdomen: Bowel Sound present, Soft and no tenderness,  Skin: no rashes Extremities: no Pedal edema, no calf tenderness Neurologic: without any new focal findings Gait not checked due to patient safety concerns  Vitals:   02/07/22 1600 02/07/22 1947 02/08/22 0531 02/08/22 0922  BP: (!) 173/81 (!) 152/88 137/76 (!) 162/80  Pulse: 66 72 69 65  Resp: '19 20 16 16  '$ Temp: 98.7 F (37.1 C) 98.2 F (36.8 C) 98 F (36.7 C) 98.1 F (36.7 C)  TempSrc: Axillary Oral Oral   SpO2: 99% 99% 98% 97%  Weight:      Height:        Intake/Output Summary (Last 24 hours) at 02/08/2022 1412 Last data filed at 02/08/2022 0602 Gross per  24 hour  Intake 1712.75 ml  Output 750 ml  Net 962.75 ml   Filed Weights   02/04/22 1456 02/06/22 0520  Weight: 76.9 kg 77.2 kg    Data Reviewed: I have personally reviewed and interpreted daily labs, tele strips, imagings as discussed above. I reviewed all nursing notes, pharmacy notes, vitals, pertinent old records I have discussed plan of care as described above with RN and patient/family.  CBC: Recent Labs  Lab 02/04/22 1521 02/05/22 0505 02/06/22 0615 02/07/22 0623 02/08/22 0450  WBC 6.3 5.5 3.3* 4.7 3.3*  NEUTROABS 4.8  --   --   --   --   HGB 10.8* 10.8* 10.7* 11.2* 10.3*  HCT 32.7* 32.9* 33.2* 32.8* 30.3*  MCV 86.1 85.7 86.2 84.5  84.9  PLT 168 155 135* 172 086   Basic Metabolic Panel: Recent Labs  Lab 02/04/22 1521 02/04/22 2151 02/05/22 0505 02/06/22 0615 02/07/22 0623 02/08/22 0450  NA 141  --  143 145 144 142  K 3.9  --  3.6 3.6 3.3* 3.7  CL 110  --  112* 112* 115* 111  CO2 22  --  '23 22 22 22  '$ GLUCOSE 118*  --  100* 88 112* 90  BUN 16  --  '14 14 14 12  '$ CREATININE 1.98*  --  1.81* 1.73* 1.48* 1.53*  CALCIUM 9.5  --  8.8* 8.5* 8.5* 7.9*  MG  --  2.0 1.9 2.0 2.0 2.1  PHOS  --   --   --  3.5 3.0 3.2    Studies: No results found.  Scheduled Meds:  aspirin EC  81 mg Oral Daily   carvedilol  6.25 mg Oral BID WC   enoxaparin (LOVENOX) injection  40 mg Subcutaneous Q24H   feeding supplement  237 mL Oral TID BM   folic acid  1 mg Oral Daily   lamoTRIgine  25 mg Oral BID   levETIRAcetam  500 mg Oral BID   losartan  25 mg Oral Daily   multivitamin with minerals  1 tablet Oral Daily   risperiDONE  1 mg Oral TID   sodium chloride flush  3 mL Intravenous Q12H   tamsulosin  0.4 mg Oral QPC supper   thiamine  100 mg Oral Daily   Continuous Infusions:  sodium chloride 75 mL/hr at 02/08/22 0523   iron sucrose 200 mg (02/08/22 1238)   PRN Meds: acetaminophen **OR** acetaminophen, hydrALAZINE, hydrALAZINE, ondansetron **OR** ondansetron (ZOFRAN) IV, polyethylene glycol  Time spent: 40 minutes  Author: Val Riles. MD [facility] 02/08/2022 2:12 PM  To reach On-call, see care teams to locate the attending and reach out to them via [email or web address]. If 7PM-7AM, please contact night-coverage If you still have difficulty reaching the attending provider, please page the [facility] (Director on Call) for [facility] on amion for assistance.

## 2022-02-08 NOTE — Plan of Care (Signed)

## 2022-02-09 DIAGNOSIS — G9340 Encephalopathy, unspecified: Secondary | ICD-10-CM | POA: Diagnosis not present

## 2022-02-09 DIAGNOSIS — I214 Non-ST elevation (NSTEMI) myocardial infarction: Secondary | ICD-10-CM | POA: Diagnosis not present

## 2022-02-09 DIAGNOSIS — M6282 Rhabdomyolysis: Secondary | ICD-10-CM | POA: Diagnosis not present

## 2022-02-09 DIAGNOSIS — I498 Other specified cardiac arrhythmias: Secondary | ICD-10-CM | POA: Diagnosis not present

## 2022-02-09 LAB — CULTURE, BLOOD (ROUTINE X 2)
Culture: NO GROWTH
Culture: NO GROWTH
Special Requests: ADEQUATE
Special Requests: ADEQUATE

## 2022-02-09 LAB — CK: Total CK: 1407 U/L — ABNORMAL HIGH (ref 49–397)

## 2022-02-09 LAB — CBC
HCT: 30.3 % — ABNORMAL LOW (ref 39.0–52.0)
Hemoglobin: 10.1 g/dL — ABNORMAL LOW (ref 13.0–17.0)
MCH: 28.1 pg (ref 26.0–34.0)
MCHC: 33.3 g/dL (ref 30.0–36.0)
MCV: 84.4 fL (ref 80.0–100.0)
Platelets: 184 10*3/uL (ref 150–400)
RBC: 3.59 MIL/uL — ABNORMAL LOW (ref 4.22–5.81)
RDW: 16.1 % — ABNORMAL HIGH (ref 11.5–15.5)
WBC: 4.4 10*3/uL (ref 4.0–10.5)
nRBC: 0 % (ref 0.0–0.2)

## 2022-02-09 LAB — BASIC METABOLIC PANEL
Anion gap: 6 (ref 5–15)
BUN: 16 mg/dL (ref 8–23)
CO2: 21 mmol/L — ABNORMAL LOW (ref 22–32)
Calcium: 8.2 mg/dL — ABNORMAL LOW (ref 8.9–10.3)
Chloride: 114 mmol/L — ABNORMAL HIGH (ref 98–111)
Creatinine, Ser: 1.42 mg/dL — ABNORMAL HIGH (ref 0.61–1.24)
GFR, Estimated: 52 mL/min — ABNORMAL LOW (ref >60)
Glucose, Bld: 95 mg/dL (ref 70–99)
Potassium: 3.7 mmol/L (ref 3.5–5.1)
Sodium: 141 mmol/L (ref 135–145)

## 2022-02-09 MED ORDER — SODIUM CHLORIDE 0.9 % IV BOLUS
500.0000 mL | Freq: Once | INTRAVENOUS | Status: AC
  Administered 2022-02-09: 500 mL via INTRAVENOUS

## 2022-02-09 NOTE — Progress Notes (Signed)
[facility] Progress Note  Patient: Jeremy [name]    ZOX:[id number]  DOA: 02/04/2022     Date of Service: the patient was seen and examined on 02/09/2022  Chief Complaint  Patient presents with   Fatigue   Altered Mental Status   Brief hospital course: Jeremy [name] is a 76 y.o. male with medical history significant of hypertension, hyperlipidemia, seizure disorder, dementia, prostate cancer, who presents to the ED with complaints of altered mental status.  History obtained through chart review due to patient's altered mental status.   Per chart review, patient presented via EMS from assisted living facility where he was experiencing lethargy and fatigue with altered mental status.  He is generally alert and oriented x 3 but is currently only oriented to self.  Per staff at group home, patient was within normal limits yesterday.     At this time, Jeremy [name] states she is unsure why he is in the ER and is unsure how he got here.  He denies any fever, chills, nausea, vomiting, diarrhea, abdominal pain.  He notes that he feels weak.   ED course: On arrival to the ED, patient was hypertensive at 170/71 with a heart rate of 87.  He was saturating at 99% on room air.  Initial workup remarkable for creatinine of 1.98, AST of 86, troponin of 1200 with downtrend to 861, hemoglobin of 10.8 and COVID-19 PCR positive.  Urinalysis with hematuria and ketonuria but no RBCs noted.  CT head and CTA chest with no abnormalities noted.  EDP discussed with cardiology, Dr. Chancy Milroy, who did not recommend initiating heparin infusion.  [facility] contacted for admission for acute encephalopathy and NSTEMI.  Consults: Cardiology  Assessment and Plan: * Acute encephalopathy Patient presenting with 1 day history of altered mental status with fatigue of unknown etiology.  Given CK elevation, differential includes postictal state in the setting of known seizure disorder.  Per chart review, his last  seizure was over 1 year ago.  Differential also includes metabolic encephalopathy in the setting of infection (COVID-19), however patient is otherwise asymptomatic from [place].   MRI brain without any evidence of acute abnormalities. - vitamin B12 wnl , TSH 0.9 wnl, RPR NR - Restarted home antiepileptics 12/23 encephalopathy resolved, patient is back to his baseline   Rhabdomyolysis Patient presenting with generalized weakness, lower extremity more so than upper extremity.  CK markedly elevated consistent with rhabdomyolysis.  Etiology uncertain at this time but differential includes breakthrough seizure in the setting of seizure disorder. CK 8343  ---7014--5560--2792--> 1,407 Improving but not ready for DC< would like to see a substantial drop in CK and improved renal recovery.  - Trend CK - Changed IV 0.45 saline 75/hr    NSTEMI (non-ST elevated myocardial infarction) [facility]) Patient presenting with troponin elevation up to 1200 with downtrend.  He is asymptomatic with no chest pain currently.  EKG demonstrating new ventricular bigeminy.  EDP discussed with Dr. Humphrey Rolls who is not currently recommending heparin infusion.   - Cardiology consulted; appreciate their recommendations - Start aspirin - Hold home rosuvastatin in the setting of rhabdomyolysis - Stat EKG and troponin if chest pain should occur - Echocardiogram LVEF 40 to 81%, LV systolic function is mildly reduced, global hypokinesis, moderate LV hypertrophy, G1DD.  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 2D echo as above, LVEF 40 to 45% Started Coreg 6.25 mg p.o. twice daily and losartan 25 mg p.o. daily Continue Cautious hydration BNP 358 slightly elevated  Hypertension Patient is not on any medication at home.,  Started Coreg 6.25 mg p.o. twice daily and losartan 25 mg p.o. daily We will continue to monitor BP and titrate medications accordingly.   Ventricular bigeminy EKG and telemetry with  persistent ventricular bigeminy with no prior history of such.  Potassium is within normal limits.     - Telemetry monitoring - Cardiology consulted; demand ischemia, recommended no intervention. - maintain  Mag > 2 - Continue to monitor potassium; maintain above 4   COVID-19 virus infection Patient denies any upper respiratory symptoms, shortness of breath or abdominal symptoms.  Given he is asymptomatic and borderline renal function, will hold off on any treatment at this time.  - Per CDC guidelines, patient should isolate for 5 days if he remains asymptomatic.  If he should develop symptoms, plan to isolate for 10 days.    Seizures Given acute encephalopathy and rhabdomyolysis, there is concern for breakthrough seizures.  At this time, no seizure activity noted on examination. -Restart home antiepileptics     Stage 3b chronic kidney disease (CKD) Creatinine was slightly elevated due to dehydration -Continue to monitor renal function Cr 1.81--1.53 improved     Dementia without behavioral disturbance  Patient has a history of dementia but is apparently alert and oriented x 3 at baseline. - Restart home risperidone    Iron deficiency, iron level 23, transferrin saturation 9%.  Started Venofer 200 mg IV daily for 5 days.  Followed by oral iron supplement.  folate within normal range   Body mass index is 22.99 kg/m.  Interventions:   Diet: Regular diet DVT Prophylaxis: Subcutaneous Lovenox   Advance goals of care discussion: Full code  Family Communication: family was NOT present at bedside, at the time of interview.  The pt provided permission to discuss medical plan with the family. Opportunity was given to ask question and all questions were answered satisfactorily.   Disposition:  Pt is from group Home, admitted with encephalopathy, COVID-positive, rhabdomyolysis, non-STEMI, still has elevated CK on IV fluid, which precludes a safe discharge. Discharge to SNF vs Home  w/HH, when CK will be trending down.  May require 2 to 3 days.  Subjective:  NAEON; He is Aox4; Reports his muscle cramps; Urine clearing up Patient stated that he would like to go home and does not want to go to SNF. Will recheck in 24H to determine preparedness  Physical Exam: General: NAD.  AAOX 4, awake and alert Appear in mild distress, affect flat in affect Eyes: PERRLA ENT: Oral Mucosa dry   Neck: no JVD,  Cardiovascular: S1 and S2 Present, no Murmur,  Respiratory: good respiratory effort, Bilateral Air entry equal and Decreased, no Crackles, no wheezes Abdomen: Bowel Sound present, Soft and no tenderness,  Skin: no rashes Extremities: no Pedal edema, no calf tenderness Neurologic: without any new focal findings Gait not checked due to patient safety concerns  Vitals:   02/08/22 1622 02/08/22 2012 02/09/22 0433 02/09/22 0759  BP: (!) 141/77 (!) 160/74 135/75 125/70  Pulse: 62 (!) 56 66 67  Resp: '16 20 16 16  '$ Temp: 98.7 F (37.1 C) 98.9 F (37.2 C) 98.7 F (37.1 C) 98.3 F (36.8 C)  TempSrc: Oral Oral Oral Oral  SpO2: 97% 99% 96% 97%  Weight:      Height:        Intake/Output Summary (Last 24 hours) at 02/09/2022 1250 Last data filed at 02/09/2022 1029 Gross per 24 hour  Intake 1247.07 ml  Output 800 ml  Net 447.07 ml    Filed Weights   02/04/22 1456 02/06/22 0520  Weight: 76.9 kg 77.2 kg    Data Reviewed: I have personally reviewed and interpreted daily labs, tele strips, imagings as discussed above. I reviewed all nursing notes, pharmacy notes, vitals, pertinent old records I have discussed plan of care as described above with RN and patient/family.  CBC: Recent Labs  Lab 02/04/22 1521 02/05/22 0505 02/06/22 0615 02/07/22 0623 02/08/22 0450 02/09/22 0746  WBC 6.3 5.5 3.3* 4.7 3.3* 4.4  NEUTROABS 4.8  --   --   --   --   --   HGB 10.8* 10.8* 10.7* 11.2* 10.3* 10.1*  HCT 32.7* 32.9* 33.2* 32.8* 30.3* 30.3*  MCV 86.1 85.7 86.2 84.5 84.9 84.4   PLT 168 155 135* 172 176 536    Basic Metabolic Panel: Recent Labs  Lab 02/04/22 2151 02/05/22 0505 02/06/22 0615 02/07/22 0623 02/08/22 0450 02/09/22 0746  NA  --  143 145 144 142 141  K  --  3.6 3.6 3.3* 3.7 3.7  CL  --  112* 112* 115* 111 114*  CO2  --  '23 22 22 22 '$ 21*  GLUCOSE  --  100* 88 112* 90 95  BUN  --  '14 14 14 12 16  '$ CREATININE  --  1.81* 1.73* 1.48* 1.53* 1.42*  CALCIUM  --  8.8* 8.5* 8.5* 7.9* 8.2*  MG 2.0 1.9 2.0 2.0 2.1  --   PHOS  --   --  3.5 3.0 3.2  --      Studies: No results found.  Scheduled Meds:  aspirin EC  81 mg Oral Daily   carvedilol  6.25 mg Oral BID WC   enoxaparin (LOVENOX) injection  40 mg Subcutaneous Q24H   feeding supplement  237 mL Oral TID BM   folic acid  1 mg Oral Daily   lamoTRIgine  25 mg Oral BID   levETIRAcetam  500 mg Oral BID   losartan  25 mg Oral Daily   multivitamin with minerals  1 tablet Oral Daily   risperiDONE  1 mg Oral TID   sodium chloride flush  3 mL Intravenous Q12H   tamsulosin  0.4 mg Oral QPC supper   thiamine  100 mg Oral Daily   Continuous Infusions:  sodium chloride 75 mL/hr at 02/09/22 1029   iron sucrose 200 mg (02/09/22 1150)   PRN Meds: acetaminophen **OR** acetaminophen, hydrALAZINE, hydrALAZINE, ondansetron **OR** ondansetron (ZOFRAN) IV, polyethylene glycol  Time spent: 40 minutes  Author: Vanna Scotland, MD [facility] 02/09/2022 12:50 PM  To reach On-call, see care teams to locate the attending and reach out to them via [email or web address]. If 7PM-7AM, please contact night-coverage If you still have difficulty reaching the attending provider, please page the [facility] (Director on Call) for [facility] on amion for assistance.

## 2022-02-10 DIAGNOSIS — I214 Non-ST elevation (NSTEMI) myocardial infarction: Secondary | ICD-10-CM | POA: Diagnosis not present

## 2022-02-10 DIAGNOSIS — G9340 Encephalopathy, unspecified: Secondary | ICD-10-CM | POA: Diagnosis not present

## 2022-02-10 DIAGNOSIS — M6282 Rhabdomyolysis: Secondary | ICD-10-CM | POA: Diagnosis not present

## 2022-02-10 DIAGNOSIS — U071 COVID-19: Secondary | ICD-10-CM | POA: Diagnosis not present

## 2022-02-10 LAB — CBC
HCT: 30.5 % — ABNORMAL LOW (ref 39.0–52.0)
Hemoglobin: 10 g/dL — ABNORMAL LOW (ref 13.0–17.0)
MCH: 28.2 pg (ref 26.0–34.0)
MCHC: 32.8 g/dL (ref 30.0–36.0)
MCV: 86.2 fL (ref 80.0–100.0)
Platelets: 212 10*3/uL (ref 150–400)
RBC: 3.54 MIL/uL — ABNORMAL LOW (ref 4.22–5.81)
RDW: 16.2 % — ABNORMAL HIGH (ref 11.5–15.5)
WBC: 4.9 10*3/uL (ref 4.0–10.5)
nRBC: 0 % (ref 0.0–0.2)

## 2022-02-10 LAB — BASIC METABOLIC PANEL
Anion gap: 5 (ref 5–15)
BUN: 15 mg/dL (ref 8–23)
CO2: 21 mmol/L — ABNORMAL LOW (ref 22–32)
Calcium: 8.2 mg/dL — ABNORMAL LOW (ref 8.9–10.3)
Chloride: 115 mmol/L — ABNORMAL HIGH (ref 98–111)
Creatinine, Ser: 1.46 mg/dL — ABNORMAL HIGH (ref 0.61–1.24)
GFR, Estimated: 50 mL/min — ABNORMAL LOW (ref >60)
Glucose, Bld: 95 mg/dL (ref 70–99)
Potassium: 4.1 mmol/L (ref 3.5–5.1)
Sodium: 141 mmol/L (ref 135–145)

## 2022-02-10 LAB — CK: Total CK: 744 U/L — ABNORMAL HIGH (ref 49–397)

## 2022-02-10 NOTE — Progress Notes (Signed)
[facility] Progress Note  Patient: Jeremy [name]    EXB:[id number]  DOA: 02/04/2022     Date of Service: the patient was seen and examined on 02/10/2022  Chief Complaint  Patient presents with   Fatigue   Altered Mental Status   Brief hospital course: Jeremy [name] is a 76 y.o. male with medical history significant of hypertension, hyperlipidemia, seizure disorder, dementia, prostate cancer, who presents to the ED with complaints of altered mental status.  History obtained through chart review due to patient's altered mental status.   Per chart review, patient presented via EMS from assisted living facility where he was experiencing lethargy and fatigue with altered mental status.  He is generally alert and oriented x 3 but is currently only oriented to self.  Per staff at group home, patient was within normal limits yesterday.     At this time, Jeremy [name].  He denies any fever, chills, nausea, vomiting, diarrhea, abdominal pain.  He notes that he feels weak.   ED course: On arrival to the ED, patient was hypertensive at 170/71 with a heart rate of 87.  He was saturating at 99% on room air.  Initial workup remarkable for creatinine of 1.98, AST of 86, troponin of 1200 with downtrend to 861, hemoglobin of 10.8 and COVID-19 PCR positive.  Urinalysis with hematuria and ketonuria but no RBCs noted.  CT head and CTA chest with no abnormalities noted.  EDP discussed with cardiology, Dr. Chancy Milroy, who did not recommend initiating heparin infusion.  [facility] contacted for admission for acute encephalopathy and NSTEMI.  Consults: Cardiology  Assessment and Plan: * Acute encephalopathy Patient presenting with 1 day history of altered mental status with fatigue of unknown etiology.  Given CK elevation, differential includes postictal state in the setting of known seizure disorder.  Per chart review, his last  seizure was over 1 year ago.  Differential also includes metabolic encephalopathy in the setting of infection (COVID-19), however patient is otherwise asymptomatic from [place].   MRI brain without any evidence of acute abnormalities. - vitamin B12 wnl , TSH 0.9 wnl, RPR NR - Restarted home antiepileptics 12/23 encephalopathy resolved, patient is back to his baseline   Rhabdomyolysis Patient presenting with generalized weakness, lower extremity more so than upper extremity.  CK markedly elevated consistent with rhabdomyolysis.  Etiology uncertain at this time but differential includes breakthrough seizure in the setting of seizure disorder. CK 8343  ---7014-->1,407-->744 Improving, medically ready for DC Encourage oral hydration     NSTEMI (non-ST elevated myocardial infarction) [facility]) Patient presenting with troponin elevation up to 1200 with downtrend.  He is asymptomatic with no chest pain currently.  EKG demonstrating new ventricular bigeminy.  EDP discussed with Dr. Humphrey Rolls who is not currently recommending heparin infusion.  - Cardiology consulted; appreciate their recommendations - currently on aspirin - Holding home rosuvastatin in the setting of rhabdomyolysis, will need to re discuss restarting when clinically appropriate - Stat EKG and troponin if chest pain should occur - Echocardiogram LVEF 40 to 10%, LV systolic function is mildly reduced, global hypokinesis, moderate LV hypertrophy, G1DD.  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 2D echo as above, LVEF 40 to 45% Started Coreg 6.25 mg p.o. twice daily and losartan 25 mg p.o. daily Continue Cautious hydration BNP 358 slightly elevated    Hypertension Patient is not on any medication at home.,  Started Coreg 6.25 mg p.o. twice daily and losartan 25 mg p.o. daily We will continue to monitor BP and titrate medications accordingly.   Ventricular bigeminy EKG and telemetry with persistent ventricular  bigeminy with no prior history of such.  Potassium is within normal limits.     - Telemetry monitoring - Cardiology consulted; demand ischemia, recommended no intervention. - maintain  Mag > 2 - Continue to monitor potassium; maintain above 4   COVID-19 virus infection Patient denies any upper respiratory symptoms, shortness of breath or abdominal symptoms.  Given he is asymptomatic and borderline renal function, will hold off on any treatment at this time.  - Per CDC guidelines, patient should isolate for 5 days if he remains asymptomatic.  If he should develop symptoms, plan to isolate for 10 days.    Seizures Given acute encephalopathy and rhabdomyolysis, there is concern for breakthrough seizures.  At this time, no seizure activity noted on examination. -Restart home antiepileptics     Stage 3b chronic kidney disease (CKD) Creatinine was slightly elevated due to dehydration -Continue to monitor renal function Cr 1.81--1.53 improved     Dementia without behavioral disturbance  Patient has a history of dementia but is apparently alert and oriented x 3 at baseline. - Restart home risperidone    Iron deficiency, iron level 23, transferrin saturation 9%.  Started Venofer 200 mg IV daily for 5 days.  Followed by oral iron supplement.  folate within normal range   Body mass index is 22.99 kg/m.  Interventions:   Diet: Regular diet DVT Prophylaxis: Subcutaneous Lovenox   Advance goals of care discussion: Full code  Family Communication: family was NOT present at bedside, at the time of interview.  The pt provided permission to discuss medical plan with the family. Opportunity was given to ask question and all questions were answered satisfactorily.   Disposition:  Pt is from group Home, admitted with encephalopathy, COVID-positive, rhabdomyolysis, non-STEMI, improving CK on IV fluid, which precludes a safe discharge. Discharge to SNF vs Home w/HH, CCM following  Subjective:   NAEON; He is in good spirits today. His CK has trended down. UOP has been OK He is medically ready, awaiting SNF placement. Likely T/Wednesday per CCM  Physical Exam: General: NAD.  AAOX 4, awake and alert Appear in mild distress, affect flat in affect Eyes: PERRLA ENT: Oral Mucosa dry   Neck: no JVD,  Cardiovascular: S1 and S2 Present, no Murmur,  Respiratory: good respiratory effort, Bilateral Air entry equal and Decreased, no Crackles, no wheezes Abdomen: Bowel Sound present, Soft and no tenderness,  Skin: no rashes Extremities: no Pedal edema, no calf tenderness Neurologic: without any new focal findings Gait not checked due to patient safety concerns  Vitals:   02/09/22 1705 02/09/22 2105 02/10/22 0509 02/10/22 0830  BP: (!) 143/65 (!) 168/67 138/72 (!) 160/72  Pulse: (!) 57 (!) 56 71 (!) 58  Resp: '16 20 16 18  '$ Temp: 98 F (36.7 C) 98.9 F (37.2 C) 98.7 F (37.1 C) 98.2 F (36.8 C)  TempSrc:  Oral Oral Oral  SpO2: 99% 99% 94% 97%  Weight:      Height:        Intake/Output Summary (Last 24 hours) at 02/10/2022 1229 Last data filed at 02/10/2022 0500 Gross per 24 hour  Intake 1144.62 ml  Output 1700 ml  Net -555.38 ml    Filed Weights   02/04/22 1456 02/06/22 0520  Weight: 76.9 kg 77.2 kg    Data  Reviewed: I have personally reviewed and interpreted daily labs, tele strips, imagings as discussed above. I reviewed all nursing notes, pharmacy notes, vitals, pertinent old records I have discussed plan of care as described above with RN and patient/family.  CBC: Recent Labs  Lab 02/04/22 1521 02/05/22 0505 02/06/22 0615 02/07/22 0623 02/08/22 0450 02/09/22 0746 02/10/22 0546  WBC 6.3   < > 3.3* 4.7 3.3* 4.4 4.9  NEUTROABS 4.8  --   --   --   --   --   --   HGB 10.8*   < > 10.7* 11.2* 10.3* 10.1* 10.0*  HCT 32.7*   < > 33.2* 32.8* 30.3* 30.3* 30.5*  MCV 86.1   < > 86.2 84.5 84.9 84.4 86.2  PLT 168   < > 135* 172 176 184 212   < > = values in this  interval not displayed.    Basic Metabolic Panel: Recent Labs  Lab 02/04/22 2151 02/05/22 0505 02/06/22 0615 02/07/22 0623 02/08/22 0450 02/09/22 0746 02/10/22 0546  NA  --  143 145 144 142 141 141  K  --  3.6 3.6 3.3* 3.7 3.7 4.1  CL  --  112* 112* 115* 111 114* 115*  CO2  --  '23 22 22 22 '$ 21* 21*  GLUCOSE  --  100* 88 112* 90 95 95  BUN  --  '14 14 14 12 16 15  '$ CREATININE  --  1.81* 1.73* 1.48* 1.53* 1.42* 1.46*  CALCIUM  --  8.8* 8.5* 8.5* 7.9* 8.2* 8.2*  MG 2.0 1.9 2.0 2.0 2.1  --   --   PHOS  --   --  3.5 3.0 3.2  --   --      Studies: No results found.  Scheduled Meds:  aspirin EC  81 mg Oral Daily   carvedilol  6.25 mg Oral BID WC   enoxaparin (LOVENOX) injection  40 mg Subcutaneous Q24H   feeding supplement  237 mL Oral TID BM   folic acid  1 mg Oral Daily   lamoTRIgine  25 mg Oral BID   levETIRAcetam  500 mg Oral BID   losartan  25 mg Oral Daily   multivitamin with minerals  1 tablet Oral Daily   risperiDONE  1 mg Oral TID   sodium chloride flush  3 mL Intravenous Q12H   tamsulosin  0.4 mg Oral QPC supper   thiamine  100 mg Oral Daily   Continuous Infusions:  sodium chloride 75 mL/hr at 02/10/22 0317   iron sucrose Stopped (02/09/22 1345)   PRN Meds: acetaminophen **OR** acetaminophen, hydrALAZINE, hydrALAZINE, ondansetron **OR** ondansetron (ZOFRAN) IV, polyethylene glycol  Time spent: 40 minutes  Author: Vanna Scotland, MD [facility] 02/10/2022 12:29 PM  To reach On-call, see care teams to locate the attending and reach out to them via [email or web address]. If 7PM-7AM, please contact night-coverage If you still have difficulty reaching the attending provider, please page the [facility] (Director on Call) for [facility] on amion for assistance.

## 2022-02-11 DIAGNOSIS — G9340 Encephalopathy, unspecified: Secondary | ICD-10-CM | POA: Diagnosis not present

## 2022-02-11 LAB — BASIC METABOLIC PANEL
Anion gap: 8 (ref 5–15)
BUN: 13 mg/dL (ref 8–23)
CO2: 20 mmol/L — ABNORMAL LOW (ref 22–32)
Calcium: 8.6 mg/dL — ABNORMAL LOW (ref 8.9–10.3)
Chloride: 112 mmol/L — ABNORMAL HIGH (ref 98–111)
Creatinine, Ser: 1.56 mg/dL — ABNORMAL HIGH (ref 0.61–1.24)
GFR, Estimated: 46 mL/min — ABNORMAL LOW (ref >60)
Glucose, Bld: 116 mg/dL — ABNORMAL HIGH (ref 70–99)
Potassium: 4.2 mmol/L (ref 3.5–5.1)
Sodium: 140 mmol/L (ref 135–145)

## 2022-02-11 LAB — CBC
HCT: 32.3 % — ABNORMAL LOW (ref 39.0–52.0)
Hemoglobin: 10.7 g/dL — ABNORMAL LOW (ref 13.0–17.0)
MCH: 28.2 pg (ref 26.0–34.0)
MCHC: 33.1 g/dL (ref 30.0–36.0)
MCV: 85 fL (ref 80.0–100.0)
Platelets: 255 10*3/uL (ref 150–400)
RBC: 3.8 MIL/uL — ABNORMAL LOW (ref 4.22–5.81)
RDW: 16.2 % — ABNORMAL HIGH (ref 11.5–15.5)
WBC: 7 10*3/uL (ref 4.0–10.5)
nRBC: 0 % (ref 0.0–0.2)

## 2022-02-11 LAB — CK: Total CK: 535 U/L — ABNORMAL HIGH (ref 49–397)

## 2022-02-11 MED ORDER — ORAL CARE MOUTH RINSE: 15.0000 mL | OROMUCOSAL | Status: DC | PRN

## 2022-02-11 NOTE — Progress Notes (Signed)
Physical Therapy Treatment Patient Details Name: Jeremy [name] MRN: [id number] DOB: [birth year] Today's Date: 02/11/2022   History of Present Illness Pt is a 76 y.o. male presenting to hospital with fatigue and AMS.  Pt admitted with acute encephalopathy, (+) COVID, rhabdomyolysis, NSTEMI, ventricular bigeminy, seizures, and htn.  PMH includes htn, HLD, seizure disorder, dementia, prostate CA, and flexor tendon repair.    PT Comments    Pt resting in bed upon PT arrival; agreeable to PT session.  During session pt SBA semi-supine to sitting edge of bed; CGA to min assist with transfers using RW; and CGA to min assist to ambulate 20 feet with RW use (mod assist initially 1x d/t initial loss of balance to R requiring assist to prevent fall).  Limited distance ambulating d/t fatigue/generalized weakness.  Will continue to focus on strengthening, balance, and progressive functional mobility during hospitalization.    Recommendations for follow up therapy are one component of a multi-disciplinary discharge planning process, led by the attending physician.  Recommendations may be updated based on patient status, additional functional criteria and insurance authorization.  Follow Up Recommendations  Skilled nursing-short term rehab (<3 hours/day) Can patient physically be transported by private vehicle: Yes   Assistance Recommended at Discharge Frequent or constant Supervision/Assistance  Patient can return home with the following A little help with walking and/or transfers;A little help with bathing/dressing/bathroom;Assistance with cooking/housework;Direct supervision/assist for medications management;Assist for transportation;Help with stairs or ramp for entrance   Equipment Recommendations  Rolling walker (2 wheels);BSC/3in1    Recommendations for Other Services OT consult     Precautions / Restrictions Precautions Precautions: Fall Precaution Comments: Seizure  precautions Restrictions Weight Bearing Restrictions: No     Mobility  Bed Mobility Overal bed mobility: Needs Assistance Bed Mobility: Supine to Sit     Supine to sit: Supervision, HOB elevated     General bed mobility comments: increased effort/time to perform on own    Transfers Overall transfer level: Needs assistance Equipment used: Rolling walker (2 wheels) Transfers: Sit to/from Stand Sit to Stand: Min assist, Min guard           General transfer comment: min assist to stand from bed; CGA to stand from recliner; vc's for UE placement    Ambulation/Gait Ambulation/Gait assistance: Min guard, Min assist Gait Distance (Feet): 20 Feet Assistive device: Rolling walker (2 wheels)   Gait velocity: decreased     General Gait Details: pt with 1 loss of balance to R initially when walking requiring mod assist to prevent fall; pt then intermittent CGA to min assist for balance; decreased B LE step length   Stairs             Wheelchair Mobility    Modified Rankin (Stroke Patients Only)       Balance Overall balance assessment: Needs assistance Sitting-balance support: No upper extremity supported, Feet supported Sitting balance-Leahy Scale: Good Sitting balance - Comments: steady sitting reaching within BOS   Standing balance support: Single extremity supported Standing balance-Leahy Scale: Fair Standing balance comment: steady standing with at least single UE support                            Cognition Arousal/Alertness: Awake/alert Behavior During Therapy: WFL for tasks assessed/performed Overall Cognitive Status: Impaired/Different from baseline Area of Impairment: Orientation, Attention, Memory, Following commands, Safety/judgement, Awareness, Problem solving  Orientation Level: Disoriented to, Time (year) Current Attention Level: Focused Memory: Decreased short-term memory Following Commands: Follows one  step commands with increased time Safety/Judgement: Decreased awareness of deficits Awareness: Intellectual Problem Solving: Slow processing, Requires verbal cues, Requires tactile cues, Decreased initiation General Comments: Baseline cognitive deficits        Exercises      General Comments  Nursing cleared pt for participation in physical therapy.  Pt agreeable to PT session.      Pertinent Vitals/Pain Pain Assessment Pain Assessment: No/denies pain Vitals (HR and O2 on room air) stable and WFL throughout treatment session.    Home Living                          Prior Function            PT Goals (current goals can now be found in the care plan section) Acute Rehab PT Goals Patient Stated Goal: to improve strength and mobility PT Goal Formulation: With patient Time For Goal Achievement: 02/21/22 Potential to Achieve Goals: Good Progress towards PT goals: Progressing toward goals    Frequency    Min 2X/week      PT Plan Current plan remains appropriate    Co-evaluation              AM-PAC PT "6 Clicks" Mobility   Outcome Measure  Help needed turning from your back to your side while in a flat bed without using bedrails?: None Help needed moving from lying on your back to sitting on the side of a flat bed without using bedrails?: A Little Help needed moving to and from a bed to a chair (including a wheelchair)?: A Little Help needed standing up from a chair using your arms (e.g., wheelchair or bedside chair)?: A Little Help needed to walk in hospital room?: A Little Help needed climbing 3-5 steps with a railing? : A Little 6 Click Score: 19    End of Session Equipment Utilized During Treatment: Gait belt Activity Tolerance: Patient limited by fatigue Patient left: in chair;with call bell/phone within reach;with chair alarm set Nurse Communication: Mobility status;Precautions;Other (comment) (pt's bed wet (bed needing total linen change  and pt could use bath); therapist changed pt into dry gown) PT Visit Diagnosis: Unsteadiness on feet (R26.81);Muscle weakness (generalized) (M62.81);Other abnormalities of gait and mobility (R26.89)     Time: 3762-8315 PT Time Calculation (min) (ACUTE ONLY): 32 min  Charges:  $Gait Training: 8-22 mins $Therapeutic Activity: 8-22 mins                     Leitha Bleak, PT 02/11/22, 12:34 PM

## 2022-02-11 NOTE — TOC Progression Note (Addendum)
Transition of Care [facility]) - Progression Note    Patient Details  Name: SANUEL LADNIER MRN: [id number] Date of Birth: 1946/01/11  Transition of Care [facility]) CM/SW Contact  Gerilyn Pilgrim, LCSW Phone Number: 02/11/2022, 3:45 PM  Clinical Narrative:   LVM for patients brother Edd Arbour [phone] regarding bed offers. SW also reached out to brother Ruby Cola [phone] and LVM. TOC will follow up tomorrow.      3:53pm Spoke with brother ronnie who stated he would like patient to go to [facility]. Due to pt's positive covid test on 12/19, would not be able to admit until 12/30. SW will start auth closer to time to discharge.     Expected Discharge Plan and Services                                               Social Determinants of Health (SDOH) Interventions SDOH Screenings   Tobacco Use: Medium Risk (02/05/2022)    Readmission Risk Interventions    02/06/2022    3:18 PM  Readmission Risk Prevention Plan  Transportation Screening Complete  Medication Review (RN Care Manager) Complete  SW Recovery Care/Counseling Consult Complete  Palliative Care Screening Not Applicable

## 2022-02-11 NOTE — Plan of Care (Signed)
  Problem: Health Behavior/Discharge Planning: Goal: Ability to manage health-related needs will improve Outcome: Progressing   Problem: Clinical Measurements: Goal: Ability to maintain clinical measurements within normal limits will improve Outcome: Progressing Goal: Will remain free from infection Outcome: Progressing   Problem: Activity: Goal: Risk for activity intolerance will decrease Outcome: Progressing   

## 2022-02-11 NOTE — Hospital Course (Signed)
76 year old male with PMH significant for HTN, HLD, seizure disorder, prostate cancer, dementia who presents with altered mental status.  Patient is from assisted living facility and he was experiencing lethargy and fatigue prior to admission.  On admission found to be positive for COVID, have a Non-STEMI felt to be related to demand ischemia and rhabdomyolysis possibly due to unwitnessed seizure activity.

## 2022-02-11 NOTE — Progress Notes (Signed)
Progress Note   Patient: Jeremy [name] HDQ:[id number] DOB: 1945/03/05 DOA: 02/04/2022     5 DOS: the patient was seen and examined on 02/11/2022   Brief hospital course: 76 year old male with PMH significant for HTN, HLD, seizure disorder, prostate cancer, dementia who presents with altered mental status.  Patient is from assisted living facility and he was experiencing lethargy and fatigue prior to admission.  On admission found to be positive for COVID, have a Non-STEMI felt to be related to demand ischemia and rhabdomyolysis possibly due to unwitnessed seizure activity.  Assessment and Plan: * Acute encephalopathy Patient presenting with 1 day history of altered mental status with fatigue of unknown etiology.  Given CK elevation, differential includes postictal state in the setting of known seizure disorder.  Per chart review, his last seizure was over 1 year ago.  Differential also includes metabolic encephalopathy in the setting of infection (COVID-19), however patient is otherwise asymptomatic from [place].  MRI brain without any evidence of acute abnormalities.  - Workup still pending includes vitamin B12, TSH, RPR - all negative - Restart home antiepileptics  Rhabdomyolysis Patient presenting with generalized weakness, lower extremity more so than upper extremity.  CK markedly elevated consistent with rhabdomyolysis.  Etiology uncertain at this time but differential includes breakthrough seizure in the setting of seizure disorder.  - Trend CK - dropping dramatically, still feels weak - IV fluid resuscitation  NSTEMI (non-ST elevated myocardial infarction) [facility]) Patient presenting with troponin elevation up to 1200 with downtrend.  He is asymptomatic with no chest pain currently.  EKG demonstrating new ventricular bigeminy.  EDP discussed with Dr. Humphrey Rolls who is not currently recommending heparin infusion.  - Cardiology consulted; appreciate their recommendations - Start  aspirin - Hold home rosuvastatin in the setting of rhabdomyolysis - Stat EKG and troponin if chest pain should occur - Echocardiogram shows EF 40-45%, LAE, aortic regurg  Ventricular bigeminy EKG and telemetry with persistent ventricular bigeminy with no prior history of such.  Potassium is within normal limits.    - Telemetry monitoring - Cardiology consulted; appreciate their recommendations - Magnesium pending; maintain above 2 - Continue to monitor potassium; maintain above 4  COVID-19 virus infection Patient denies any upper respiratory symptoms, shortness of breath or abdominal symptoms.  Given he is asymptomatic and borderline renal function, will hold off on any treatment at this time.  - Per CDC guidelines, patient should isolate for 5 days if he remains asymptomatic.  If he should develop symptoms, plan to isolate for 10 days.  Seizures ([place]) Given acute encephalopathy and rhabdomyolysis, there is concern for breakthrough seizures.  At this time, no seizure activity noted on examination.  -Restart home antiepileptics  Hypertension On coreg  Stage 3b chronic kidney disease (CKD) ([place]) Renal function at baseline at this time. Avoid nephrotoxic agents  -Continue to monitor renal function while admitted  Dementia without behavioral disturbance ([place]) Patient has a history of dementia but is apparently alert and oriented x 3 at baseline.  - Continue risperidone         Subjective: feels weak and tired  Physical Exam: Vitals:   02/10/22 2025 02/11/22 0631 02/11/22 0822 02/11/22 1440  BP: (!) 158/95 138/79 128/76 130/72  Pulse: (!) 54 72 77 62  Resp: '16 20 16 16  '$ Temp: 98 F (36.7 C) 98.5 F (36.9 C) 98.5 F (36.9 C) 98 F (36.7 C)  TempSrc: Oral Oral    SpO2: 100% 94% 97% 97%  Weight:  Height:       Physical Examination: General appearance - alert, well appearing, and in no distress Chest - normal effort Heart - normal rate and regular  rhythm  Data Reviewed: Results for orders placed or performed during the hospital encounter of 02/04/22 (from the past 24 hour(s))  CK     Status: Abnormal   Collection Time: 02/11/22  3:45 AM  Result Value Ref Range   Total CK 535 (H) 49 - 397 U/L  Basic metabolic panel     Status: Abnormal   Collection Time: 02/11/22  3:45 AM  Result Value Ref Range   Sodium 140 135 - 145 mmol/L   Potassium 4.2 3.5 - 5.1 mmol/L   Chloride 112 (H) 98 - 111 mmol/L   CO2 20 (L) 22 - 32 mmol/L   Glucose, Bld 116 (H) 70 - 99 mg/dL   BUN 13 8 - 23 mg/dL   Creatinine, Ser 1.56 (H) 0.61 - 1.24 mg/dL   Calcium 8.6 (L) 8.9 - 10.3 mg/dL   GFR, Estimated 46 (L) >60 mL/min   Anion gap 8 5 - 15  CBC     Status: Abnormal   Collection Time: 02/11/22  3:45 AM  Result Value Ref Range   WBC 7.0 4.0 - 10.5 K/uL   RBC 3.80 (L) 4.22 - 5.81 MIL/uL   Hemoglobin 10.7 (L) 13.0 - 17.0 g/dL   HCT 32.3 (L) 39.0 - 52.0 %   MCV 85.0 80.0 - 100.0 fL   MCH 28.2 26.0 - 34.0 pg   MCHC 33.1 30.0 - 36.0 g/dL   RDW 16.2 (H) 11.5 - 15.5 %   Platelets 255 150 - 400 K/uL   nRBC 0.0 0.0 - 0.2 %     Family Communication: patient at bedside  Disposition: Status is: Inpatient Remains inpatient appropriate because: unsafe discharge plan, ongoing IVF hydration needs  Planned Discharge Destination:  assisted living facility DVT prophylaxis: Lovenox Time spent: 36 minutes  Author: Donnamae Jude, MD 02/11/2022 5:09 PM  For on call review [email or web address].

## 2022-02-12 DIAGNOSIS — G9340 Encephalopathy, unspecified: Secondary | ICD-10-CM | POA: Diagnosis not present

## 2022-02-12 LAB — CBC
HCT: 29.3 % — ABNORMAL LOW (ref 39.0–52.0)
Hemoglobin: 9.8 g/dL — ABNORMAL LOW (ref 13.0–17.0)
MCH: 28.6 pg (ref 26.0–34.0)
MCHC: 33.4 g/dL (ref 30.0–36.0)
MCV: 85.4 fL (ref 80.0–100.0)
Platelets: 236 10*3/uL (ref 150–400)
RBC: 3.43 MIL/uL — ABNORMAL LOW (ref 4.22–5.81)
RDW: 16.6 % — ABNORMAL HIGH (ref 11.5–15.5)
WBC: 6.6 10*3/uL (ref 4.0–10.5)
nRBC: 0 % (ref 0.0–0.2)

## 2022-02-12 LAB — CK: Total CK: 323 U/L (ref 49–397)

## 2022-02-12 LAB — BASIC METABOLIC PANEL
Anion gap: 6 (ref 5–15)
BUN: 16 mg/dL (ref 8–23)
CO2: 21 mmol/L — ABNORMAL LOW (ref 22–32)
Calcium: 8.4 mg/dL — ABNORMAL LOW (ref 8.9–10.3)
Chloride: 113 mmol/L — ABNORMAL HIGH (ref 98–111)
Creatinine, Ser: 1.52 mg/dL — ABNORMAL HIGH (ref 0.61–1.24)
GFR, Estimated: 47 mL/min — ABNORMAL LOW (ref >60)
Glucose, Bld: 99 mg/dL (ref 70–99)
Potassium: 4 mmol/L (ref 3.5–5.1)
Sodium: 140 mmol/L (ref 135–145)

## 2022-02-12 NOTE — Progress Notes (Signed)
PROGRESS NOTE  Jeremy [name]:[id number] DOB: 04/23/1945 DOA: 02/04/2022 PCP: Idelle Crouch, MD   LOS: 6 days   Brief Narrative / Interim history: 76 year old male with HTN, HLD, dementia, seizure disorder comes into the emergency room with altered mental status, he comes from ALF where he was experiencing lethargy, fatigue, confusion and was found to be COVID-positive.  Subjective / 24h Interval events: No significant complaints, no shortness of breath, no cough or chest congestion  Assesement and Plan: Principal Problem:   Acute encephalopathy Active Problems:   Rhabdomyolysis   NSTEMI (non-ST elevated myocardial infarction) (HCC)   Ventricular bigeminy   COVID-19 virus infection   Seizures (HCC)   Hypertension   Stage 3b chronic kidney disease (CKD) (HCC)   Dementia without behavioral disturbance ([place])   Principal problem Acute metabolic encephalopathy -Patient presenting with 1 day history of altered mental status with fatigue of unknown etiology.  Given CK elevation, differential includes postictal state in the setting of known seizure disorder.  Per chart review, his last seizure was over 1 year ago.  Differential also includes metabolic encephalopathy in the setting of infection (COVID-19), however patient is otherwise asymptomatic from [place].  Due to underlying possible dementia unclear medication compliance. MRI brain without any evidence of acute abnormalities. Vitamin B12 wn, TSH 0.9 wnl, RPR NR. Restarted home antiepileptics.  Appears back to baseline  Active problems Rhabdomyolysis -Patient presenting with generalized weakness, lower extremity more so than upper extremity.  CK markedly elevated consistent with rhabdomyolysis.  Was given fluids, CK now normalized    NSTEMI (non-ST elevated myocardial infarction) ([place]) -Patient presenting with troponin elevation up to 1200 with downtrend.  He is asymptomatic with no chest pain currently.  EKG demonstrating  new ventricular bigeminy.  Cardiology consulted, recommend heparin infusion but outpatient workup for CAD. Echocardiogram LVEF 40 to 44%, LV systolic function is mildly reduced, global hypokinesis, moderate LV hypertrophy, G1DD.   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 Started Coreg 6.25 mg p.o. twice daily and losartan 25 mg p.o. daily   Hypertension -Patient is not on any medication at home.,  Started Coreg 6.25 mg p.o. twice daily and losartan 25 mg p.o. daily We will continue to monitor BP and titrate medications accordingly.   Ventricular bigeminy -EKG and telemetry with persistent ventricular bigeminy with no prior history of such.  Monitor potassium and magnesium   COVID-19 virus infection -Patient denies any upper respiratory symptoms, shortness of breath or abdominal symptoms.  Given he is asymptomatic and borderline renal function, will hold off on any treatment at this time.  Needs SNF, will be discharged after 10 days of isolation per SW   Seizures -continue home antiepileptics  Stage 3b chronic kidney disease (CKD) -baseline 1.3-1.6, currently at baseline   Dementia without behavioral disturbance  -Patient has a history of dementia but is apparently alert and oriented x 3 at baseline.   Iron deficiency - iron level 23, transferrin saturation 9%.  Started Venofer 200 mg IV daily for 5 days.  Followed by oral iron supplement. Folate within normal range    Scheduled Meds:  aspirin EC  81 mg Oral Daily   carvedilol  6.25 mg Oral BID WC   enoxaparin (LOVENOX) injection  40 mg Subcutaneous Q24H   feeding supplement  237 mL Oral TID BM   folic acid  1 mg Oral Daily   lamoTRIgine  25 mg Oral BID   levETIRAcetam  500 mg Oral BID   losartan  25 mg Oral Daily   multivitamin with minerals  1 tablet Oral Daily   risperiDONE  1 mg Oral TID   sodium chloride flush  3 mL Intravenous Q12H   tamsulosin  0.4 mg Oral QPC supper    thiamine  100 mg Oral Daily   Continuous Infusions:  sodium chloride 75 mL/hr at 02/12/22 0417   PRN Meds:.acetaminophen **OR** acetaminophen, hydrALAZINE, hydrALAZINE, ondansetron **OR** ondansetron (ZOFRAN) IV, mouth rinse, polyethylene glycol  Current Outpatient Medications  Medication Instructions   acetaminophen (TYLENOL) 650 mg, Oral, Every 4 hours PRN   alum & mag hydroxide-simeth (MAALOX/MYLANTA) 200-200-20 MG/5ML suspension 30 mLs, Oral, Daily PRN   CALCIUM + VITAMIN D3 600-10 MG-MCG TABS TAKE 1 TABLET BY MOUTH 2 TIMES DAILY   cyanocobalamin (VITAMIN B12) 1,000 mcg, Intramuscular, Every 30 days   guaiFENesin (ROBITUSSIN) 200 mg, Oral, Every 6 hours PRN   HYDROcodone-acetaminophen (NORCO) 5-325 MG tablet 1-2 tablets, Oral, Every 6 hours PRN   lamoTRIgine (LAMICTAL) 25 mg, Oral, 2 times daily   levETIRAcetam (KEPPRA) 500 mg, Oral, 2 times daily   loperamide (IMODIUM) 2 mg, Oral, 4 times daily PRN   magnesium hydroxide (MILK OF MAGNESIA) 400 MG/5ML suspension 30 mLs, Oral, Daily PRN   risperiDONE (RISPERDAL) 1 mg, Oral, 3 times daily   rosuvastatin (CRESTOR) 10 mg, Oral, Daily   tamsulosin (FLOMAX) 0.4 mg, Oral, Daily after supper    Diet Orders (From admission, onward)     Start     Ordered   02/04/22 2324  Diet regular Room service appropriate? Yes; Fluid consistency: Thin  Diet effective now       Question Answer Comment  Room service appropriate? Yes   Fluid consistency: Thin      02/04/22 2324            DVT prophylaxis: enoxaparin (LOVENOX) injection 40 mg Start: 02/04/22 2330   Lab Results  Component Value Date   PLT 236 02/12/2022      Code Status: Full Code  Family Communication: no family at bedside   Status is: Inpatient  Remains inpatient appropriate because: waiting SNF  Level of care: Telemetry Medical  Consultants:  none  Objective: Vitals:   02/11/22 1440 02/11/22 1919 02/12/22 0500 02/12/22 0835  BP: 130/72 129/64 126/72 (!)  146/80  Pulse: 62 69 85 64  Resp: '16 20  16  '$ Temp: 98 F (36.7 C) 98.3 F (36.8 C) 97.9 F (36.6 C) 98.2 F (36.8 C)  TempSrc:   Oral   SpO2: 97% 98% 98% 95%  Weight:      Height:        Intake/Output Summary (Last 24 hours) at 02/12/2022 1151 Last data filed at 02/12/2022 0417 Gross per 24 hour  Intake 1800.42 ml  Output --  Net 1800.42 ml   Wt Readings from Last 3 Encounters:  02/06/22 77.2 kg  01/23/22 72.6 kg  02/12/21 70.3 kg    Examination:  Constitutional: NAD Eyes: no scleral icterus ENMT: Mucous membranes are moist.  Neck: normal, supple Respiratory: clear to auscultation bilaterally, no wheezing, no crackles. Normal respiratory effort. No accessory muscle use.  Cardiovascular: Regular rate and rhythm, no murmurs / rubs / gallops. No LE edema.  Abdomen: non distended, no tenderness. Bowel sounds positive.  Musculoskeletal: no clubbing / cyanosis.   Data Reviewed: I have independently reviewed following labs and imaging studies   CBC Recent Labs  Lab 02/08/22 0450 02/09/22 0746 02/10/22 0546  02/11/22 0345 02/12/22 0432  WBC 3.3* 4.4 4.9 7.0 6.6  HGB 10.3* 10.1* 10.0* 10.7* 9.8*  HCT 30.3* 30.3* 30.5* 32.3* 29.3*  PLT 176 184 212 255 236  MCV 84.9 84.4 86.2 85.0 85.4  MCH 28.9 28.1 28.2 28.2 28.6  MCHC 34.0 33.3 32.8 33.1 33.4  RDW 16.1* 16.1* 16.2* 16.2* 16.6*    Recent Labs  Lab 02/06/22 0615 02/07/22 0623 02/08/22 0450 02/09/22 0746 02/10/22 0546 02/11/22 0345 02/12/22 0432  NA 145 144 142 141 141 140 140  K 3.6 3.3* 3.7 3.7 4.1 4.2 4.0  CL 112* 115* 111 114* 115* 112* 113*  CO2 '22 22 22 '$ 21* 21* 20* 21*  GLUCOSE 88 112* 90 95 95 116* 99  BUN '14 14 12 16 15 13 16  '$ CREATININE 1.73* 1.48* 1.53* 1.42* 1.46* 1.56* 1.52*  CALCIUM 8.5* 8.5* 7.9* 8.2* 8.2* 8.6* 8.4*  AST 87* 85* 50*  --   --   --   --   ALT 27 32 24  --   --   --   --   ALKPHOS 45 48 41  --   --   --   --   BILITOT 0.9 0.6 0.8  --   --   --   --   ALBUMIN 3.2* 3.4* 3.1*   --   --   --   --   MG 2.0 2.0 2.1  --   --   --   --     ------------------------------------------------------------------------------------------------------------------ No results for input(s): "CHOL", "HDL", "LDLCALC", "TRIG", "CHOLHDL", "LDLDIRECT" in the last 72 hours.  No results found for: "HGBA1C" ------------------------------------------------------------------------------------------------------------------ No results for input(s): "TSH", "T4TOTAL", "T3FREE", "THYROIDAB" in the last 72 hours.  Invalid input(s): "FREET3"  Cardiac Enzymes No results for input(s): "CKMB", "TROPONINI", "MYOGLOBIN" in the last 168 hours.  Invalid input(s): "CK" ------------------------------------------------------------------------------------------------------------------    Component Value Date/Time   BNP 358.6 (H) 02/04/2022 1520    CBG: No results for input(s): "GLUCAP" in the last 168 hours.  Recent Results (from the past 240 hour(s))  Resp panel by RT-PCR (RSV, Flu A&B, Covid) Anterior Nasal Swab     Status: Abnormal   Collection Time: 02/04/22  3:20 PM   Specimen: Anterior Nasal Swab  Result Value Ref Range Status   SARS Coronavirus 2 by RT PCR POSITIVE (A) NEGATIVE Final    Comment: (NOTE) SARS-CoV-2 target nucleic acids are DETECTED.  The SARS-CoV-2 RNA is generally detectable in upper respiratory specimens during the acute phase of infection. Positive results are indicative of the presence of the identified virus, but do not rule out bacterial infection or co-infection with other pathogens not detected by the test. Clinical correlation with patient history and other diagnostic information is necessary to determine patient infection status. The expected result is Negative.  Fact Sheet for Patients: EntrepreneurPulse.com.au  Fact Sheet for Healthcare Providers: IncredibleEmployment.be  This test is not yet approved or cleared by the  [place] FDA and  has been authorized for detection and/or diagnosis of SARS-CoV-2 by FDA under an Emergency Use Authorization (EUA).  This EUA will remain in effect (meaning this test can be used) for the duration of  the COVID-19 declaration under Section 564(b)(1) of the A ct, 21 U.S.C. section 360bbb-3(b)(1), unless the authorization is terminated or revoked sooner.     Influenza A by PCR NEGATIVE NEGATIVE Final   Influenza B by PCR NEGATIVE NEGATIVE Final    Comment: (NOTE) The Xpert Xpress SARS-CoV-2/FLU/RSV plus assay is  intended as an aid in the diagnosis of influenza from Nasopharyngeal swab specimens and should not be used as a sole basis for treatment. Nasal washings and aspirates are unacceptable for Xpert Xpress SARS-CoV-2/FLU/RSV testing.  Fact Sheet for Patients: EntrepreneurPulse.com.au  Fact Sheet for Healthcare Providers: IncredibleEmployment.be  This test is not yet approved or cleared by the [place] FDA and has been authorized for detection and/or diagnosis of SARS-CoV-2 by FDA under an Emergency Use Authorization (EUA). This EUA will remain in effect (meaning this test can be used) for the duration of the COVID-19 declaration under Section 564(b)(1) of the Act, 21 U.S.C. section 360bbb-3(b)(1), unless the authorization is terminated or revoked.     Resp Syncytial Virus by PCR NEGATIVE NEGATIVE Final    Comment: (NOTE) Fact Sheet for Patients: EntrepreneurPulse.com.au  Fact Sheet for Healthcare Providers: IncredibleEmployment.be  This test is not yet approved or cleared by the [place] FDA and has been authorized for detection and/or diagnosis of SARS-CoV-2 by FDA under an Emergency Use Authorization (EUA). This EUA will remain in effect (meaning this test can be used) for the duration of the COVID-19 declaration under Section 564(b)(1) of the Act, 21 U.S.C. section  360bbb-3(b)(1), unless the authorization is terminated or revoked.  Performed at [facility], [address] [place], [place] [address]   Culture, blood (routine x 2)     Status: None   Collection Time: 02/04/22  3:21 PM   Specimen: BLOOD  Result Value Ref Range Status   Specimen Description BLOOD BLOOD RIGHT ARM  Final   Special Requests   Final    BOTTLES DRAWN AEROBIC AND ANAEROBIC Blood Culture adequate volume   Culture   Final    NO GROWTH 5 DAYS Performed at [facility], [address] [place], [place] [address]    Report Status 02/09/2022 FINAL  Final  Culture, blood (routine x 2)     Status: None   Collection Time: 02/04/22  5:47 PM   Specimen: BLOOD  Result Value Ref Range Status   Specimen Description BLOOD BLOOD RIGHT HAND  Final   Special Requests   Final    BOTTLES DRAWN AEROBIC AND ANAEROBIC Blood Culture adequate volume   Culture   Final    NO GROWTH 5 DAYS Performed at [facility], [address] [place], [place] [address]    Report Status 02/09/2022 FINAL  Final     Radiology Studies: No results found.   Marzetta Board, MD, PhD [facility]  Between 7 am - 7 pm I am available, please contact me via Amion (for emergencies) or Securechat (non urgent messages)  Between 7 pm - 7 am I am not available, please contact night coverage MD/APP via Amion

## 2022-02-12 NOTE — Progress Notes (Signed)
Occupational Therapy Treatment Patient Details Name: Jeremy [name] MRN: [id number] DOB: 10/19/1945 Today's Date: 02/12/2022   History of present illness Pt is a 76 year old male admitted admitted with encephalopathy, COVID-positive, rhabdomyolysis, non-STEMI, still has elevated CK on IV fluid; PMH significant for  hypertension, hyperlipidemia, seizure disorder, dementia, prostate cancer   OT comments  Chart reviewed, pt greeted in room, agreeable to OT tx session. Pt is oriented x3, not oriented to date, increased time for processing. Tx session targeted improving functional activity tolerance in preparation for ADL tasks. Pt requires increased assist for mobility as compared to evaluation, therefore discharge recommendation changed to SNF. Poor use of RW on this date with poor carry over of use of AD. If pt had minimal physical assist at home, would be beneficial for pt to return home. If unable to provide that level of assist, SNF is indicated. OT will continue to follow acutely.    Recommendations for follow up therapy are one component of a multi-disciplinary discharge planning process, led by the attending physician.  Recommendations may be updated based on patient status, additional functional criteria and insurance authorization.    Follow Up Recommendations  Skilled nursing-short term rehab (<3 hours/day)     Assistance Recommended at Discharge Frequent or constant Supervision/Assistance  Patient can return home with the following  A little help with walking and/or transfers;A little help with bathing/dressing/bathroom;Assistance with cooking/housework;Direct supervision/assist for medications management;Direct supervision/assist for financial management   Equipment Recommendations  Other (comment) (per next venue of care)    Recommendations for Other Services      Precautions / Restrictions Precautions Precautions: Fall Precaution Comments: Seizure  precautions Restrictions Weight Bearing Restrictions: No       Mobility Bed Mobility Overal bed mobility: Needs Assistance Bed Mobility: Supine to Sit     Supine to sit: Supervision          Transfers Overall transfer level: Needs assistance Equipment used: Rolling walker (2 wheels) Transfers: Sit to/from Stand Sit to Stand: Min guard                 Balance Overall balance assessment: Needs assistance Sitting-balance support: No upper extremity supported, Feet supported Sitting balance-Leahy Scale: Good     Standing balance support: Bilateral upper extremity supported, During functional activity Standing balance-Leahy Scale: Fair Standing balance comment: static standing with supervision however one posterior LOB                           ADL either performed or assessed with clinical judgement   ADL Overall ADL's : Needs assistance/impaired Eating/Feeding: Set up;Sitting   Grooming: Wash/dry face;Sitting;Cueing for sequencing               Lower Body Dressing: Maximal assistance   Toilet Transfer: Minimal assistance;Rolling walker (2 wheels);Cueing for safety;Cueing for sequencing   Toileting- Clothing Manipulation and Hygiene: Maximal assistance;Sit to/from stand;Cueing for sequencing       Functional mobility during ADLs: Minimal assistance;Rolling walker (2 wheels);Cueing for safety;Cueing for sequencing      Extremity/Trunk Assessment              Vision       Perception     Praxis      Cognition Arousal/Alertness: Awake/alert Behavior During Therapy: WFL for tasks assessed/performed Overall Cognitive Status: No family/caregiver present to determine baseline cognitive functioning Area of Impairment: Orientation, Attention, Memory, Following commands, Safety/judgement, Awareness, Problem solving  Orientation Level: Disoriented to, Time Current Attention Level: Focused Memory: Decreased  short-term memory Following Commands: Follows one step commands with increased time Safety/Judgement: Decreased awareness of deficits Awareness: Intellectual Problem Solving: Slow processing, Requires verbal cues, Requires tactile cues, Decreased initiation          Exercises      Shoulder Instructions       General Comments spo2 >90% on RA throughout    Pertinent Vitals/ Pain       Pain Assessment Pain Assessment: No/denies pain  Home Living                                          Prior Functioning/Environment              Frequency  Min 2X/week        Progress Toward Goals  OT Goals(current goals can now be found in the care plan section)  Progress towards OT goals: Progressing toward goals     Plan Discharge plan remains appropriate    Co-evaluation                 AM-PAC OT "6 Clicks" Daily Activity     Outcome Measure   Help from another person eating meals?: None Help from another person taking care of personal grooming?: A Little Help from another person toileting, which includes using toliet, bedpan, or urinal?: A Lot Help from another person bathing (including washing, rinsing, drying)?: A Lot Help from another person to put on and taking off regular upper body clothing?: A Little Help from another person to put on and taking off regular lower body clothing?: A Lot 6 Click Score: 16    End of Session Equipment Utilized During Treatment: Rolling walker (2 wheels)  OT Visit Diagnosis: Unsteadiness on feet (R26.81);Muscle weakness (generalized) (M62.81)   Activity Tolerance Patient tolerated treatment well   Patient Left in chair;with call bell/phone within reach;with chair alarm set   Nurse Communication Mobility status (NT aware of pt status)        Time: 0211-1735 OT Time Calculation (min): 15 min  Charges: OT General Charges $OT Visit: 1 Visit OT Treatments $Therapeutic Activity: 8-22 mins  Shanon Payor, OTD OTR/L  02/12/22, 3:53 PM

## 2022-02-12 NOTE — Progress Notes (Signed)
Physical Therapy Treatment Patient Details Name: Jeremy [name] MRN: [id number] DOB: [birth year] Today's Date: 02/12/2022   History of Present Illness Jeremy [name] is a 76 y.o. male presenting to hospital with fatigue and AMS.  Pt admitted with acute encephalopathy, (+) COVID, rhabdomyolysis, NSTEMI, ventricular bigeminy, seizures, and htn.  PMH htn, HLD, seizure disorder, dementia, prostate CA.    PT Comments    Pt in recliner on entry, agreeable to session. Attemped entire session without assistive device to facilitate rehabilitation of balance impairment.Pt able to partake in several transfers, standing pericare, standing hand hygiene, and backward step practice, all at min guard assist. Balance in low in confidence, showing good safety awareness. Pt has 1 LOB while turning 180 degrees prior to sitting. Cues needed to assure lineup with chair prior to sit. VSS in session, no signs of exertion. NA in room at EOS.     Recommendations for follow up therapy are one component of a multi-disciplinary discharge planning process, led by the attending physician.  Recommendations may be updated based on patient status, additional functional criteria and insurance authorization.  Follow Up Recommendations  Skilled nursing-short term rehab (<3 hours/day) Can patient physically be transported by private vehicle: Yes   Assistance Recommended at Discharge Frequent or constant Supervision/Assistance  Patient can return home with the following A little help with walking and/or transfers;A little help with bathing/dressing/bathroom;Assistance with cooking/housework;Direct supervision/assist for medications management;Assist for transportation;Help with stairs or ramp for entrance   Equipment Recommendations  Rolling walker (2 wheels);BSC/3in1    Recommendations for Other Services OT consult     Precautions / Restrictions Precautions Precautions: Fall Precaution Comments: Seizure  precautions Restrictions Weight Bearing Restrictions: No     Mobility  Bed Mobility               General bed mobility comments: in recliner on entry    Transfers Overall transfer level: Needs assistance Equipment used: None Transfers: Sit to/from Stand Sit to Stand: Min guard           General transfer comment: low confidence balance    Ambulation/Gait Ambulation/Gait assistance: Min guard Gait Distance (Feet): 36 Feet Assistive device: None   Gait velocity: increased         Stairs             Wheelchair Mobility    Modified Rankin (Stroke Patients Only)       Balance                                            Cognition                                                Exercises Other Exercises Other Exercises: -fwd/backward stepping without UE support x12 Other Exercises: STS from recliner x5, no device Other Exercises: SPT transfer bed to [facility] to bed Other Exercises: static standing for RUE pericare s/p BM x2 minutes Other Exercises: AMB to/from sink with unsupported handwashing    General Comments        Pertinent Vitals/Pain      Home Living                          Prior  Function            PT Goals (current goals can now be found in the care plan section) Acute Rehab PT Goals Patient Stated Goal: to improve strength and mobility PT Goal Formulation: With patient Time For Goal Achievement: 02/21/22 Potential to Achieve Goals: Good Progress towards PT goals: Progressing toward goals    Frequency    Min 2X/week      PT Plan Current plan remains appropriate    Co-evaluation              AM-PAC PT "6 Clicks" Mobility   Outcome Measure  Help needed turning from your back to your side while in a flat bed without using bedrails?: None Help needed moving from lying on your back to sitting on the side of a flat bed without using bedrails?: A Little Help needed  moving to and from a bed to a chair (including a wheelchair)?: A Little Help needed standing up from a chair using your arms (e.g., wheelchair or bedside chair)?: A Little Help needed to walk in hospital room?: A Little Help needed climbing 3-5 steps with a railing? : A Little 6 Click Score: 19    End of Session   Activity Tolerance: Patient tolerated treatment well Patient left: with call bell/phone within reach;with chair alarm set;in chair   PT Visit Diagnosis: Unsteadiness on feet (R26.81);Muscle weakness (generalized) (M62.81);Other abnormalities of gait and mobility (R26.89)     Time: 9753-0051 PT Time Calculation (min) (ACUTE ONLY): 33 min  Charges:  $Therapeutic Activity: 23-37 mins                    11:57 AM, 02/12/22 Etta Grandchild, PT, DPT Physical Therapist - [facility]  [phone] ([place])     Amaia Lavallie C 02/12/2022, 11:54 AM

## 2022-02-13 DIAGNOSIS — G9340 Encephalopathy, unspecified: Secondary | ICD-10-CM | POA: Diagnosis not present

## 2022-02-13 LAB — CBC
HCT: 28.4 % — ABNORMAL LOW (ref 39.0–52.0)
Hemoglobin: 9.5 g/dL — ABNORMAL LOW (ref 13.0–17.0)
MCH: 28.8 pg (ref 26.0–34.0)
MCHC: 33.5 g/dL (ref 30.0–36.0)
MCV: 86.1 fL (ref 80.0–100.0)
Platelets: 259 10*3/uL (ref 150–400)
RBC: 3.3 MIL/uL — ABNORMAL LOW (ref 4.22–5.81)
RDW: 16.9 % — ABNORMAL HIGH (ref 11.5–15.5)
WBC: 5.9 10*3/uL (ref 4.0–10.5)
nRBC: 0 % (ref 0.0–0.2)

## 2022-02-13 LAB — BASIC METABOLIC PANEL
Anion gap: 5 (ref 5–15)
BUN: 16 mg/dL (ref 8–23)
CO2: 21 mmol/L — ABNORMAL LOW (ref 22–32)
Calcium: 8.2 mg/dL — ABNORMAL LOW (ref 8.9–10.3)
Chloride: 115 mmol/L — ABNORMAL HIGH (ref 98–111)
Creatinine, Ser: 1.6 mg/dL — ABNORMAL HIGH (ref 0.61–1.24)
GFR, Estimated: 45 mL/min — ABNORMAL LOW (ref >60)
Glucose, Bld: 97 mg/dL (ref 70–99)
Potassium: 4.1 mmol/L (ref 3.5–5.1)
Sodium: 141 mmol/L (ref 135–145)

## 2022-02-13 NOTE — TOC Progression Note (Signed)
Transition of Care [facility]) - Progression Note    Patient Details  Name: Jeremy [name] MRN: [id number] Date of Birth: 11-25-1945  Transition of Care [facility]) CM/SW Contact  Beverly Sessions, RN Phone Number: 02/13/2022, 3:13 PM  Clinical Narrative:      Per Hilda Blades at [facility] they can take patient tomorrow on day 10 of quarantine pending insurance Juda with [facility] has started [place] If auth not obtained by tomorrow they can admit on Saturday pending auth but would need dc summary by 3 pm 12/29.  MD updated       Expected Discharge Plan and Services                                               Social Determinants of Health (SDOH) Interventions SDOH Screenings   Tobacco Use: Medium Risk (02/05/2022)    Readmission Risk Interventions    02/06/2022    3:18 PM  Readmission Risk Prevention Plan  Transportation Screening Complete  Medication Review (RN Care Manager) Complete  SW Recovery Care/Counseling Consult Complete  Palliative Care Screening Not Applicable

## 2022-02-13 NOTE — TOC Progression Note (Signed)
Transition of Care [facility]) - Progression Note    Patient Details  Name: [name] MRN: [id number] Date of Birth: 12-07-1945  Transition of Care [facility]) CM/SW Contact  Beverly Sessions, RN Phone Number: 02/13/2022, 10:27 AM  Clinical Narrative:     Day 11 after covid positive 12/30 Messaged sent to Hilda Blades at [facility] to determine if they can accept on 12/30 since it falls on a weekend.  If they can then [facility] to start auth        Expected Discharge Plan and Services                                               Social Determinants of Health (SDOH) Interventions SDOH Screenings   Tobacco Use: Medium Risk (02/05/2022)    Readmission Risk Interventions    02/06/2022    3:18 PM  Readmission Risk Prevention Plan  Transportation Screening Complete  Medication Review (RN Care Manager) Complete  SW Recovery Care/Counseling Consult Complete  Palliative Care Screening Not Applicable

## 2022-02-13 NOTE — Progress Notes (Signed)
PROGRESS NOTE  Jeremy [name] OMV:[id number] DOB: Jan 14, 1946 DOA: 02/04/2022 PCP: Idelle Crouch, MD   LOS: 7 days   Brief Narrative / Interim history: 76 year old male with HTN, HLD, dementia, seizure disorder comes into the emergency room with altered mental status, he comes from ALF where he was experiencing lethargy, fatigue, confusion and was found to be COVID-positive.  Subjective / 24h Interval events: No complaints, no shortness of breath, cough or chest congestion  Assesement and Plan: Principal Problem:   Acute encephalopathy Active Problems:   Rhabdomyolysis   NSTEMI (non-ST elevated myocardial infarction) (HCC)   Ventricular bigeminy   COVID-19 virus infection   Seizures (HCC)   Hypertension   Stage 3b chronic kidney disease (CKD) (HCC)   Dementia without behavioral disturbance ([place])   Principal problem Acute metabolic encephalopathy -Patient presenting with 1 day history of altered mental status with fatigue of unknown etiology.  Given CK elevation, differential includes postictal state in the setting of known seizure disorder.  Per chart review, his last seizure was over 1 year ago.  Differential also includes metabolic encephalopathy in the setting of infection (COVID-19), however patient is otherwise asymptomatic from [place].  Due to underlying possible dementia unclear medication compliance. MRI brain without any evidence of acute abnormalities. Vitamin B12 wn, TSH 0.9 wnl, RPR NR. Restarted home antiepileptics.  Appears back to baseline  Active problems Rhabdomyolysis -Patient presenting with generalized weakness, lower extremity more so than upper extremity.  CK markedly elevated consistent with rhabdomyolysis.  Was given fluids, CK normalized, discontinue fluids today   NSTEMI (non-ST elevated myocardial infarction) ([place]) -Patient presenting with troponin elevation up to 1200 with downtrend.  He is asymptomatic with no chest pain currently.  EKG  demonstrating new ventricular bigeminy.  Cardiology consulted, recommend heparin infusion but outpatient workup for CAD. Echocardiogram LVEF 40 to 62%, LV systolic function is mildly reduced, global hypokinesis, moderate LV hypertrophy, G1DD.   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 Started Coreg 6.25 mg p.o. twice daily and losartan 25 mg p.o. daily   Hypertension -Patient is not on any medication at home.,  Started Coreg 6.25 mg p.o. twice daily and losartan 25 mg p.o. daily. BP sable today   Ventricular bigeminy -EKG and telemetry with persistent ventricular bigeminy with no prior history of such.  Monitor potassium and magnesium   COVID-19 virus infection -Patient denies any upper respiratory symptoms, shortness of breath or abdominal symptoms.  Given he is asymptomatic and borderline renal function, will hold off on any treatment at this time.  Needs SNF, will be discharged after 10 days of isolation per SW, from my standpoint he could go tomorrow, 12/29   Seizures -continue home antiepileptics  Stage 3b chronic kidney disease (CKD) -baseline 1.3-1.6, at baseline this morning   Dementia without behavioral disturbance  -Patient has a history of dementia but is apparently alert and oriented x 3 at baseline.   Iron deficiency - iron level 23, transferrin saturation 9%.  Started Venofer 200 mg IV daily for 5 days.  Followed by oral iron supplement. Folate within normal range    Scheduled Meds:  aspirin EC  81 mg Oral Daily   carvedilol  6.25 mg Oral BID WC   enoxaparin (LOVENOX) injection  40 mg Subcutaneous Q24H   feeding supplement  237 mL Oral TID BM   folic acid  1 mg Oral Daily   lamoTRIgine  25 mg Oral BID  levETIRAcetam  500 mg Oral BID   losartan  25 mg Oral Daily   multivitamin with minerals  1 tablet Oral Daily   risperiDONE  1 mg Oral TID   sodium chloride flush  3 mL Intravenous Q12H   tamsulosin  0.4 mg Oral QPC  supper   thiamine  100 mg Oral Daily   Continuous Infusions:  sodium chloride 75 mL/hr at 02/13/22 0630   PRN Meds:.acetaminophen **OR** acetaminophen, hydrALAZINE, hydrALAZINE, ondansetron **OR** ondansetron (ZOFRAN) IV, mouth rinse, polyethylene glycol  Current Outpatient Medications  Medication Instructions   acetaminophen (TYLENOL) 650 mg, Oral, Every 4 hours PRN   alum & mag hydroxide-simeth (MAALOX/MYLANTA) 200-200-20 MG/5ML suspension 30 mLs, Oral, Daily PRN   CALCIUM + VITAMIN D3 600-10 MG-MCG TABS TAKE 1 TABLET BY MOUTH 2 TIMES DAILY   cyanocobalamin (VITAMIN B12) 1,000 mcg, Intramuscular, Every 30 days   guaiFENesin (ROBITUSSIN) 200 mg, Oral, Every 6 hours PRN   HYDROcodone-acetaminophen (NORCO) 5-325 MG tablet 1-2 tablets, Oral, Every 6 hours PRN   lamoTRIgine (LAMICTAL) 25 mg, Oral, 2 times daily   levETIRAcetam (KEPPRA) 500 mg, Oral, 2 times daily   loperamide (IMODIUM) 2 mg, Oral, 4 times daily PRN   magnesium hydroxide (MILK OF MAGNESIA) 400 MG/5ML suspension 30 mLs, Oral, Daily PRN   risperiDONE (RISPERDAL) 1 mg, Oral, 3 times daily   rosuvastatin (CRESTOR) 10 mg, Oral, Daily   tamsulosin (FLOMAX) 0.4 mg, Oral, Daily after supper    Diet Orders (From admission, onward)     Start     Ordered   02/04/22 2324  Diet regular Room service appropriate? Yes; Fluid consistency: Thin  Diet effective now       Question Answer Comment  Room service appropriate? Yes   Fluid consistency: Thin      02/04/22 2324            DVT prophylaxis: enoxaparin (LOVENOX) injection 40 mg Start: 02/04/22 2330   Lab Results  Component Value Date   PLT 259 02/13/2022      Code Status: Full Code  Family Communication: no family at bedside   Status is: Inpatient  Remains inpatient appropriate because: waiting SNF  Level of care: Telemetry Medical  Consultants:  none  Objective: Vitals:   02/12/22 2006 02/13/22 0331 02/13/22 0631 02/13/22 0822  BP: 131/62 117/61 124/65  111/76  Pulse: 61 60 62 (!) 56  Resp: '16 16 16 20  '$ Temp: 98 F (36.7 C) 98.2 F (36.8 C) 98.3 F (36.8 C) 98.3 F (36.8 C)  TempSrc:  Oral Oral   SpO2: 95% 96% 96% 97%  Weight:      Height:        Intake/Output Summary (Last 24 hours) at 02/13/2022 0954 Last data filed at 02/13/2022 6644 Gross per 24 hour  Intake --  Output 1050 ml  Net -1050 ml    Wt Readings from Last 3 Encounters:  02/06/22 77.2 kg  01/23/22 72.6 kg  02/12/21 70.3 kg    Examination:  Constitutional: NAD Eyes: lids and conjunctivae normal, no scleral icterus ENMT: mmm Neck: normal, supple Respiratory: clear to auscultation bilaterally, no wheezing, no crackles. Normal respiratory effort.  Cardiovascular: Regular rate and rhythm, no murmurs / rubs / gallops. No LE edema. Abdomen: soft, no distention, no tenderness. Bowel sounds positive.  Skin: no rashes Neurologic: no focal deficits, equal strength  Data Reviewed: I have independently reviewed following labs and imaging studies   CBC Recent Labs  Lab 02/09/22 0746 02/10/22 0546  02/11/22 0345 02/12/22 0432 02/13/22 0515  WBC 4.4 4.9 7.0 6.6 5.9  HGB 10.1* 10.0* 10.7* 9.8* 9.5*  HCT 30.3* 30.5* 32.3* 29.3* 28.4*  PLT 184 212 255 236 259  MCV 84.4 86.2 85.0 85.4 86.1  MCH 28.1 28.2 28.2 28.6 28.8  MCHC 33.3 32.8 33.1 33.4 33.5  RDW 16.1* 16.2* 16.2* 16.6* 16.9*     Recent Labs  Lab 02/07/22 0623 02/08/22 0450 02/09/22 0746 02/10/22 0546 02/11/22 0345 02/12/22 0432 02/13/22 0515  NA 144 142 141 141 140 140 141  K 3.3* 3.7 3.7 4.1 4.2 4.0 4.1  CL 115* 111 114* 115* 112* 113* 115*  CO2 22 22 21* 21* 20* 21* 21*  GLUCOSE 112* 90 95 95 116* 99 97  BUN '14 12 16 15 13 16 16  '$ CREATININE 1.48* 1.53* 1.42* 1.46* 1.56* 1.52* 1.60*  CALCIUM 8.5* 7.9* 8.2* 8.2* 8.6* 8.4* 8.2*  AST 85* 50*  --   --   --   --   --   ALT 32 24  --   --   --   --   --   ALKPHOS 48 41  --   --   --   --   --   BILITOT 0.6 0.8  --   --   --   --   --    ALBUMIN 3.4* 3.1*  --   --   --   --   --   MG 2.0 2.1  --   --   --   --   --      ------------------------------------------------------------------------------------------------------------------ No results for input(s): "CHOL", "HDL", "LDLCALC", "TRIG", "CHOLHDL", "LDLDIRECT" in the last 72 hours.  No results found for: "HGBA1C" ------------------------------------------------------------------------------------------------------------------ No results for input(s): "TSH", "T4TOTAL", "T3FREE", "THYROIDAB" in the last 72 hours.  Invalid input(s): "FREET3"  Cardiac Enzymes No results for input(s): "CKMB", "TROPONINI", "MYOGLOBIN" in the last 168 hours.  Invalid input(s): "CK" ------------------------------------------------------------------------------------------------------------------    Component Value Date/Time   BNP 358.6 (H) 02/04/2022 1520    CBG: No results for input(s): "GLUCAP" in the last 168 hours.  Recent Results (from the past 240 hour(s))  Resp panel by RT-PCR (RSV, Flu A&B, Covid) Anterior Nasal Swab     Status: Abnormal   Collection Time: 02/04/22  3:20 PM   Specimen: Anterior Nasal Swab  Result Value Ref Range Status   SARS Coronavirus 2 by RT PCR POSITIVE (A) NEGATIVE Final    Comment: (NOTE) SARS-CoV-2 target nucleic acids are DETECTED.  The SARS-CoV-2 RNA is generally detectable in upper respiratory specimens during the acute phase of infection. Positive results are indicative of the presence of the identified virus, but do not rule out bacterial infection or co-infection with other pathogens not detected by the test. Clinical correlation with patient history and other diagnostic information is necessary to determine patient infection status. The expected result is Negative.  Fact Sheet for Patients: EntrepreneurPulse.com.au  Fact Sheet for Healthcare Providers: IncredibleEmployment.be  This test is not  yet approved or cleared by the [place] FDA and  has been authorized for detection and/or diagnosis of SARS-CoV-2 by FDA under an Emergency Use Authorization (EUA).  This EUA will remain in effect (meaning this test can be used) for the duration of  the COVID-19 declaration under Section 564(b)(1) of the A ct, 21 U.S.C. section 360bbb-3(b)(1), unless the authorization is terminated or revoked sooner.     Influenza A by PCR NEGATIVE NEGATIVE Final   Influenza B by  PCR NEGATIVE NEGATIVE Final    Comment: (NOTE) The Xpert Xpress SARS-CoV-2/FLU/RSV plus assay is intended as an aid in the diagnosis of influenza from Nasopharyngeal swab specimens and should not be used as a sole basis for treatment. Nasal washings and aspirates are unacceptable for Xpert Xpress SARS-CoV-2/FLU/RSV testing.  Fact Sheet for Patients: EntrepreneurPulse.com.au  Fact Sheet for Healthcare Providers: IncredibleEmployment.be  This test is not yet approved or cleared by the [place] FDA and has been authorized for detection and/or diagnosis of SARS-CoV-2 by FDA under an Emergency Use Authorization (EUA). This EUA will remain in effect (meaning this test can be used) for the duration of the COVID-19 declaration under Section 564(b)(1) of the Act, 21 U.S.C. section 360bbb-3(b)(1), unless the authorization is terminated or revoked.     Resp Syncytial Virus by PCR NEGATIVE NEGATIVE Final    Comment: (NOTE) Fact Sheet for Patients: EntrepreneurPulse.com.au  Fact Sheet for Healthcare Providers: IncredibleEmployment.be  This test is not yet approved or cleared by the [place] FDA and has been authorized for detection and/or diagnosis of SARS-CoV-2 by FDA under an Emergency Use Authorization (EUA). This EUA will remain in effect (meaning this test can be used) for the duration of the COVID-19 declaration under Section 564(b)(1)  of the Act, 21 U.S.C. section 360bbb-3(b)(1), unless the authorization is terminated or revoked.  Performed at [facility], [address] [place], [place] [address]   Culture, blood (routine x 2)     Status: None   Collection Time: 02/04/22  3:21 PM   Specimen: BLOOD  Result Value Ref Range Status   Specimen Description BLOOD BLOOD RIGHT ARM  Final   Special Requests   Final    BOTTLES DRAWN AEROBIC AND ANAEROBIC Blood Culture adequate volume   Culture   Final    NO GROWTH 5 DAYS Performed at [facility], [address], [place], [place] [address]    Report Status 02/09/2022 FINAL  Final  Culture, blood (routine x 2)     Status: None   Collection Time: 02/04/22  5:47 PM   Specimen: BLOOD  Result Value Ref Range Status   Specimen Description BLOOD BLOOD RIGHT HAND  Final   Special Requests   Final    BOTTLES DRAWN AEROBIC AND ANAEROBIC Blood Culture adequate volume   Culture   Final    NO GROWTH 5 DAYS Performed at [facility], [address] [place],  [address]    Report Status 02/09/2022 FINAL  Final     Radiology Studies: No results found.   Marzetta Board, MD, PhD [facility]  Between 7 am - 7 pm I am available, please contact me via Amion (for emergencies) or Securechat (non urgent messages)  Between 7 pm - 7 am I am not available, please contact night coverage MD/APP via Amion

## 2022-02-14 DIAGNOSIS — I10 Essential (primary) hypertension: Secondary | ICD-10-CM | POA: Diagnosis not present

## 2022-02-14 DIAGNOSIS — E785 Hyperlipidemia, unspecified: Secondary | ICD-10-CM | POA: Diagnosis not present

## 2022-02-14 DIAGNOSIS — E611 Iron deficiency: Secondary | ICD-10-CM | POA: Diagnosis not present

## 2022-02-14 DIAGNOSIS — N1832 Chronic kidney disease, stage 3b: Secondary | ICD-10-CM | POA: Diagnosis not present

## 2022-02-14 DIAGNOSIS — I214 Non-ST elevation (NSTEMI) myocardial infarction: Secondary | ICD-10-CM | POA: Diagnosis not present

## 2022-02-14 DIAGNOSIS — M6281 Muscle weakness (generalized): Secondary | ICD-10-CM | POA: Diagnosis not present

## 2022-02-14 DIAGNOSIS — R569 Unspecified convulsions: Secondary | ICD-10-CM | POA: Diagnosis not present

## 2022-02-14 DIAGNOSIS — U071 COVID-19: Secondary | ICD-10-CM | POA: Diagnosis not present

## 2022-02-14 DIAGNOSIS — R5381 Other malaise: Secondary | ICD-10-CM | POA: Diagnosis not present

## 2022-02-14 DIAGNOSIS — G9341 Metabolic encephalopathy: Secondary | ICD-10-CM | POA: Diagnosis not present

## 2022-02-14 DIAGNOSIS — D509 Iron deficiency anemia, unspecified: Secondary | ICD-10-CM | POA: Diagnosis not present

## 2022-02-14 DIAGNOSIS — G9340 Encephalopathy, unspecified: Secondary | ICD-10-CM | POA: Diagnosis not present

## 2022-02-14 DIAGNOSIS — C61 Malignant neoplasm of prostate: Secondary | ICD-10-CM | POA: Diagnosis not present

## 2022-02-14 DIAGNOSIS — R69 Illness, unspecified: Secondary | ICD-10-CM | POA: Diagnosis not present

## 2022-02-14 DIAGNOSIS — M6282 Rhabdomyolysis: Secondary | ICD-10-CM | POA: Diagnosis not present

## 2022-02-14 DIAGNOSIS — Z743 Need for continuous supervision: Secondary | ICD-10-CM | POA: Diagnosis not present

## 2022-02-14 DIAGNOSIS — I5042 Chronic combined systolic (congestive) and diastolic (congestive) heart failure: Secondary | ICD-10-CM | POA: Diagnosis not present

## 2022-02-14 DIAGNOSIS — R32 Unspecified urinary incontinence: Secondary | ICD-10-CM | POA: Diagnosis not present

## 2022-02-14 DIAGNOSIS — I498 Other specified cardiac arrhythmias: Secondary | ICD-10-CM | POA: Diagnosis not present

## 2022-02-14 DIAGNOSIS — F0390 Unspecified dementia without behavioral disturbance: Secondary | ICD-10-CM | POA: Diagnosis not present

## 2022-02-14 MED ORDER — CARVEDILOL 6.25 MG PO TABS: 6.2500 mg | ORAL_TABLET | Freq: Two times a day (BID) | ORAL | Status: DC

## 2022-02-14 MED ORDER — FERROUS SULFATE 325 (65 FE) MG PO TBEC: 325.0000 mg | DELAYED_RELEASE_TABLET | Freq: Two times a day (BID) | ORAL | 3 refills | Status: DC

## 2022-02-14 MED ORDER — HYDROCODONE-ACETAMINOPHEN 5-325 MG PO TABS: 1.0000 | ORAL_TABLET | Freq: Four times a day (QID) | ORAL | 0 refills | Status: DC | PRN

## 2022-02-14 MED ORDER — LOSARTAN POTASSIUM 25 MG PO TABS: 25.0000 mg | ORAL_TABLET | Freq: Every day | ORAL | Status: DC

## 2022-02-14 MED ORDER — ASPIRIN 81 MG PO TBEC: 81.0000 mg | DELAYED_RELEASE_TABLET | Freq: Every day | ORAL | 12 refills | Status: DC

## 2022-02-14 NOTE — TOC Progression Note (Addendum)
Transition of Care [facility]) - Progression Note    Patient Details  Name: Jeremy [name] MRN: [id number] Date of Birth: October 01, 1945  Transition of Care [facility]) CM/SW Contact  Beverly Sessions, RN Phone Number: 02/14/2022, 11:29 AM  Clinical Narrative:     Insurance auth still pending    125pm - auth still pending     Expected Discharge Plan and Services                                               Social Determinants of Health (SDOH) Interventions SDOH Screenings   Tobacco Use: Medium Risk (02/05/2022)    Readmission Risk Interventions    02/06/2022    3:18 PM  Readmission Risk Prevention Plan  Transportation Screening Complete  Medication Review (RN Care Manager) Complete  SW Recovery Care/Counseling Consult Complete  Palliative Care Screening Not Applicable

## 2022-02-14 NOTE — TOC Transition Note (Signed)
Transition of Care [facility]) - CM/SW Discharge Note   Patient Details  Name: Jeremy [name] MRN: [id number] Date of Birth: 1945-11-24  Transition of Care [facility]) CM/SW Contact:  Beverly Sessions, RN Phone Number: 02/14/2022, 2:21 PM   Clinical Narrative:     Patient will DC to:[place] Anticipated DC date:02/14/22  Family notified: Brother Artist [id number]  Per MD patient ready for DC to . RN, patient's family, and facility notified of DC. Discharge Summary sent to facility. RN given number for report. DC packet on chart. Ambulance transport requested for patient.  TOC signing off.  Isaias Cowman [facility] [phone]         Patient Goals and CMS Choice      Discharge Placement                         Discharge Plan and Services Additional resources added to the After Visit Summary for                                       Social Determinants of Health (SDOH) Interventions SDOH Screenings   Tobacco Use: Medium Risk (02/05/2022)     Readmission Risk Interventions    02/06/2022    3:18 PM  Readmission Risk Prevention Plan  Transportation Screening Complete  Medication Review (RN Care Manager) Complete  SW Recovery Care/Counseling Consult Complete  Palliative Care Screening Not Applicable

## 2022-02-14 NOTE — Care Management Important Message (Signed)
Important Message  Patient Details  Name: Jeremy [name] MRN: [id number] Date of Birth: Dec 23, 1945   Medicare Important Message Given:  Yes     Dannette Barbara 02/14/2022, 11:09 AM

## 2022-02-14 NOTE — TOC Progression Note (Signed)
Transition of Care [facility]) - Progression Note    Patient Details  Name: ARTEMIO DOBIE MRN: [id number] Date of Birth: 1945-11-11  Transition of Care [facility]) CM/SW Contact  Beverly Sessions, RN Phone Number: 02/14/2022, 9:45 AM  Clinical Narrative:    Insurance auth pending in [place] portal         Expected Discharge Plan and Services                                               Social Determinants of Health (SDOH) Interventions SDOH Screenings   Tobacco Use: Medium Risk (02/05/2022)    Readmission Risk Interventions    02/06/2022    3:18 PM  Readmission Risk Prevention Plan  Transportation Screening Complete  Medication Review (RN Care Manager) Complete  SW Recovery Care/Counseling Consult Complete  Palliative Care Screening Not Applicable

## 2022-02-14 NOTE — Discharge Summary (Signed)
Physician Discharge Summary  Jeremy [name]:[id number] DOB: 01-19-1946 DOA: 02/04/2022  PCP: Idelle Crouch, MD  Admit date: 02/04/2022 Discharge date: 02/14/2022  Admitted From: home Disposition:  SNF  Recommendations for Outpatient Follow-up:  Follow up with PCP in 1-2 weeks Please obtain BMP/CBC in one week  Home Health: none Equipment/Devices: none  Discharge Condition: stable CODE STATUS: Full code Diet Orders (From admission, onward)     Start     Ordered   02/04/22 2324  Diet regular Room service appropriate? Yes; Fluid consistency: Thin  Diet effective now       Question Answer Comment  Room service appropriate? Yes   Fluid consistency: Thin      02/04/22 2324            HPI: Per admitting MD, Jeremy [name] is a 76 y.o. male with medical history significant of hypertension, hyperlipidemia, seizure disorder, dementia, prostate cancer, who presents to the ED with complaints of altered mental status.  History obtained through chart review due to patient's altered mental status. Per chart review, patient presented via EMS from assisted living facility where he was experiencing lethargy and fatigue with altered mental status.  He is generally alert and oriented x 3 but is currently only oriented to self.  Per staff at group home, patient was within normal limits yesterday. At this time, Jeremy [name] states she is unsure why he is in the ER and is unsure how he got here.  He denies any fever, chills, nausea, vomiting, diarrhea, abdominal pain.  He notes that he feels weak.  Hospital Course / Discharge diagnoses: Principal Problem:   Acute encephalopathy Active Problems:   Rhabdomyolysis   NSTEMI (non-ST elevated myocardial infarction) (HCC)   Ventricular bigeminy   COVID-19 virus infection   Seizures (HCC)   Hypertension   Stage 3b chronic kidney disease (CKD) (HCC)   Dementia without behavioral disturbance ([place])   Principal  problem Acute metabolic encephalopathy -Patient presenting with 1 day history of altered mental status with fatigue of unknown etiology.  Given CK elevation, differential includes postictal state in the setting of known seizure disorder.  Per chart review, his last seizure was over 1 year ago, and his medication compliance is not clear.  Differential also includes metabolic encephalopathy in the setting of infection (COVID-19), however patient is otherwise asymptomatic from [place].  MRI brain without any evidence of acute abnormalities. Vitamin B12 wn, TSH 0.9 wnl, RPR NR. Restarted home antiepileptics.  Appears back to baseline   Active problems Rhabdomyolysis -Patient presenting with generalized weakness, lower extremity more so than upper extremity.  CK markedly elevated consistent with rhabdomyolysis.  Was given fluids, CK normalized NSTEMI (non-ST elevated myocardial infarction) ([place]) -Patient presenting with troponin elevation up to 1200 with downtrend.  He is asymptomatic with no chest pain currently.  EKG demonstrating new ventricular bigeminy.  Cardiology consulted, recommend heparin infusion but outpatient workup for CAD. Echocardiogram LVEF 40 to 36%, LV systolic function is mildly reduced, global hypokinesis, moderate LV hypertrophy, G1DD.  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  Continue Coreg, aspirin, statin, ARB  Hypertension -Patient is not on any medication at home.  Continue medications as below, blood pressure stable Ventricular bigeminy -EKG and telemetry with persistent ventricular bigeminy with no prior history of such.  COVID-19 virus infection -Patient denies any upper respiratory symptoms, shortness of breath or  abdominal symptoms.  Was not treated, completed 10 days of isolation Seizures -continue home antiepileptics Stage 3b chronic kidney disease (CKD) -baseline 1.3-1.6, at  baseline Dementia without behavioral disturbance  -Patient has a history of dementia but is mild, answers orientation questions Iron deficiency - iron level 23, transferrin saturation 9%.  Started Venofer 200 mg IV daily for 5 days.  Followed by oral iron supplement. Folate within normal range  Sepsis ruled out   Discharge Instructions   Allergies as of 02/14/2022   No Known Allergies      Medication List     TAKE these medications    acetaminophen 325 MG tablet Commonly known as: TYLENOL Take 650 mg by mouth every 4 (four) hours as needed for moderate pain, fever or headache.   alum & mag hydroxide-simeth 200-200-20 MG/5ML suspension Commonly known as: MAALOX/MYLANTA Take 30 mLs by mouth daily as needed for indigestion or heartburn.   aspirin EC 81 MG tablet Take 1 tablet (81 mg total) by mouth daily. Swallow whole. Start taking on: February 15, 2022   Calcium + Vitamin D3 600-10 MG-MCG Tabs Generic drug: Calcium Carb-Cholecalciferol TAKE 1 TABLET BY MOUTH 2 TIMES DAILY   carvedilol 6.25 MG tablet Commonly known as: COREG Take 1 tablet (6.25 mg total) by mouth 2 (two) times daily with a meal.   cyanocobalamin 1000 MCG/ML injection Commonly known as: VITAMIN B12 Inject 1,000 mcg into the muscle every 30 (thirty) days.   ferrous sulfate 325 (65 FE) MG EC tablet Take 1 tablet (325 mg total) by mouth 2 (two) times daily.   guaiFENesin 100 MG/5ML liquid Commonly known as: ROBITUSSIN Take 200 mg by mouth every 6 (six) hours as needed for cough.   HYDROcodone-acetaminophen 5-325 MG tablet Commonly known as: Norco Take 1-2 tablets by mouth every 6 (six) hours as needed for moderate pain.   lamoTRIgine 25 MG tablet Commonly known as: LAMICTAL Take 25 mg by mouth 2 (two) times daily.   levETIRAcetam 500 MG tablet Commonly known as: KEPPRA Take 500 mg by mouth 2 (two) times daily.   loperamide 2 MG capsule Commonly known as: IMODIUM Take 2 mg by mouth 4 (four)  times daily as needed for diarrhea or loose stools.   losartan 25 MG tablet Commonly known as: COZAAR Take 1 tablet (25 mg total) by mouth daily. Start taking on: February 15, 2022   magnesium hydroxide 400 MG/5ML suspension Commonly known as: MILK OF MAGNESIA Take 30 mLs by mouth daily as needed for mild constipation.   risperiDONE 2 MG tablet Commonly known as: RISPERDAL Take 1 mg by mouth 3 (three) times daily.   rosuvastatin 10 MG tablet Commonly known as: CRESTOR Take 10 mg by mouth daily.   tamsulosin 0.4 MG Caps capsule Commonly known as: FLOMAX Take 0.4 mg by mouth daily after supper.        Consultations: Cardiology   Procedures/Studies:  ECHOCARDIOGRAM COMPLETE  Result Date: 02/05/2022    ECHOCARDIOGRAM REPORT   Patient Name:   ZYSHAWN BOHNENKAMP Date of Exam: 02/05/2022 Medical Rec #:  [id number]             Height:       72.0 in Accession #:    [id number]            Weight:       169.5 lb Date of Birth:  [birth year]             BSA:  1.986 m Patient Age:    73 years              BP:           167/89 mmHg Patient Gender: M                     HR:           91 bpm. Exam Location:  [facility] Procedure: 2D Echo, Color Doppler and Cardiac Doppler Indications:     I21.4 NSTEMI  History:         Patient has no prior history of Echocardiogram examinations.                  Risk Factors:Hypertension. Pt tested positive for COVID-19 on                  02/04/22.  Sonographer:     Charmayne Sheer Referring Phys:  [id number] Jose Persia Diagnosing Phys: Neoma Laming  Sonographer Comments: No parasternal window. IMPRESSIONS  1. Left ventricular ejection fraction, by estimation, is 40 to 45%. The left ventricle has mildly decreased function. The left ventricle demonstrates global hypokinesis. There is moderate left ventricular hypertrophy. Left ventricular diastolic parameters are consistent with Grade I diastolic dysfunction (impaired relaxation).  2. Right ventricular systolic  function is low normal. The right ventricular size is mildly enlarged.  3. Left atrial size was moderately dilated.  4. Right atrial size was mildly dilated.  5. The mitral valve is normal in structure. Mild mitral valve regurgitation. No evidence of mitral stenosis.  6. The aortic valve is calcified. Aortic valve regurgitation is moderate. Aortic valve sclerosis/calcification is present, without any evidence of aortic stenosis.  7. The inferior vena cava is normal in size with greater than 50% respiratory variability, suggesting right atrial pressure of 3 mmHg. FINDINGS  Left Ventricle: Left ventricular ejection fraction, by estimation, is 40 to 45%. The left ventricle has mildly decreased function. The left ventricle demonstrates global hypokinesis. The left ventricular internal cavity size was normal in size. There is  moderate left ventricular hypertrophy. Left ventricular diastolic parameters are consistent with Grade I diastolic dysfunction (impaired relaxation). Right Ventricle: The right ventricular size is mildly enlarged. No increase in right ventricular wall thickness. Right ventricular systolic function is low normal. Left Atrium: Left atrial size was moderately dilated. Right Atrium: Right atrial size was mildly dilated. Pericardium: There is no evidence of pericardial effusion. Mitral Valve: The mitral valve is normal in structure. Mild mitral valve regurgitation. No evidence of mitral valve stenosis. Tricuspid Valve: The tricuspid valve is normal in structure. Tricuspid valve regurgitation is mild . No evidence of tricuspid stenosis. Aortic Valve: The aortic valve is calcified. Aortic valve regurgitation is moderate. Aortic regurgitation PHT measures 312 msec. Aortic valve sclerosis/calcification is present, without any evidence of aortic stenosis. Aortic valve mean gradient measures  8.0 mmHg. Aortic valve peak gradient measures 13.0 mmHg. Pulmonic Valve: The pulmonic valve was normal in structure.  Pulmonic valve regurgitation is not visualized. No evidence of pulmonic stenosis. Aorta: The aortic root is normal in size and structure. Venous: The inferior vena cava is normal in size with greater than 50% respiratory variability, suggesting right atrial pressure of 3 mmHg. IAS/Shunts: No atrial level shunt detected by color flow Doppler.   LV Volumes (MOD) LV vol d, MOD A2C: 78.7 ml  Diastology LV vol d, MOD A4C: 111.0 ml LV e' medial:    5.44 cm/s LV vol s, MOD  A2C: 46.2 ml  LV E/e' medial:  9.7 LV vol s, MOD A4C: 66.8 ml  LV e' lateral:   8.92 cm/s LV SV MOD A2C:     32.5 ml  LV E/e' lateral: 5.9 LV SV MOD A4C:     111.0 ml LV SV MOD BP:      36.7 ml RIGHT VENTRICLE RV Basal diam:  4.10 cm LEFT ATRIUM             Index        RIGHT ATRIUM           Index LA Vol (A2C):   53.6 ml 26.99 ml/m  RA Area:     13.60 cm LA Vol (A4C):   40.9 ml 20.60 ml/m  RA Volume:   35.20 ml  17.73 ml/m LA Biplane Vol: 49.4 ml 24.88 ml/m  AORTIC VALVE AV Vmax:           180.00 cm/s AV Vmean:          130.000 cm/s AV VTI:            0.305 m AV Peak Grad:      13.0 mmHg AV Mean Grad:      8.0 mmHg LVOT Vmax:         110.00 cm/s LVOT Vmean:        77.300 cm/s LVOT VTI:          0.184 m LVOT/AV VTI ratio: 0.60 AI PHT:            312 msec MITRAL VALVE MV Area (PHT): 5.34 cm     SHUNTS MV Decel Time: 142 msec     Systemic VTI: 0.18 m MV E velocity: 52.80 cm/s MV A velocity: 122.00 cm/s MV E/A ratio:  0.43 [employer] signed by Neoma Laming Signature Date/Time: 02/05/2022/2:29:41 PM    Final    MR BRAIN WO CONTRAST  Result Date: 02/04/2022 CLINICAL DATA:  Mental status change EXAM: MRI HEAD WITHOUT CONTRAST TECHNIQUE: Multiplanar, multiecho pulse sequences of the brain and surrounding structures were obtained without intravenous contrast. COMPARISON:  No prior MRI available, correlation is made with 02/04/2022 CT head FINDINGS: Evaluation is somewhat limited by motion artifact. Brain: No restricted diffusion to  suggest acute or subacute infarct.No acute hemorrhage, mass, mass effect, or midline shift. No hydrocephalus or extra-axial collection.No hemosiderin deposition to suggest remote hemorrhage.Normal pituitary and craniocervical junction.Advanced cerebral atrophy for age. Scattered and confluent T2 hyperintense signal in the periventricular white matter, likely the sequela of moderate chronic small vessel ischemic disease. Vascular: Patent arterial flow voids. Skull and upper cervical spine: Normal marrow signal. Sinuses/Orbits: Clear paranasal sinuses.No acute finding in the orbits. Other: The mastoid air cells are well aerated. IMPRESSION: No acute intracranial process. No evidence of acute or subacute infarct. Electronically Signed   By: Merilyn Baba M.D.   On: 02/04/2022 22:59   CT Angio Chest PE W/Cm &/Or Wo Cm  Result Date: 02/04/2022 CLINICAL DATA:  Fatigue altered mental status EXAM: CT ANGIOGRAPHY CHEST WITH CONTRAST TECHNIQUE: Multidetector CT imaging of the chest was performed using the standard protocol during bolus administration of intravenous contrast. Multiplanar CT image reconstructions and MIPs were obtained to evaluate the vascular anatomy. RADIATION DOSE REDUCTION: This exam was performed according to the departmental dose-optimization program which includes automated exposure control, adjustment of the mA and/or kV according to patient size and/or use of iterative reconstruction technique. CONTRAST:  60m OMNIPAQUE IOHEXOL 350 MG/ML SOLN COMPARISON:  Chest  x-ray 02/04/2022, CT chest 03/12/2017, 01/13/2015 FINDINGS: Cardiovascular: Satisfactory opacification of the pulmonary arteries to the segmental level. No evidence of pulmonary embolism. Normal heart size. No pericardial effusion. Mild aortic atherosclerosis. No aneurysm. No dissection. Normal cardiac size. No pericardial effusion Mediastinum/Nodes: Midline trachea. No thyroid mass. No suspicious lymph nodes. Esophagus within normal  limits. Lungs/Pleura: No acute airspace disease or effusion. Mild apical emphysema. 7 mm right middle lobe pulmonary nodule, series 6, image 87, slightly spiculated margin but no significant change since 2016 and therefore felt consistent with benign finding. Mild fibrosis in the medial right lung base. Upper Abdomen: No acute abnormality. Musculoskeletal: Degenerative changes of the spine. No acute osseous abnormality Review of the MIP images confirms the above findings. IMPRESSION: 1. Negative for acute pulmonary embolus or aortic dissection. 2. Mild emphysema. No acute airspace disease. 3. Stable 7 mm right middle lobe pulmonary nodule since 2016 and therefore felt consistent with benign finding. No follow-up imaging is recommended 4. Aortic atherosclerosis. Aortic Atherosclerosis (ICD10-I70.0) and Emphysema (ICD10-J43.9). Electronically Signed   By: Donavan Foil M.D.   On: 02/04/2022 17:47   DG Chest Port 1 View  Result Date: 02/04/2022 CLINICAL DATA:  Altered mental status, fever. EXAM: PORTABLE CHEST 1 VIEW COMPARISON:  September 15, 2020. FINDINGS: Stable cardiomediastinal silhouette. Mild central pulmonary vascular congestion is noted. Minimal bibasilar subsegmental atelectasis or edema is noted. Bony thorax is unremarkable. IMPRESSION: Stable cardiomediastinal silhouette mild central pulmonary vascular congestion. Minimal bibasilar subsegmental atelectasis or edema. Electronically Signed   By: Marijo Conception M.D.   On: 02/04/2022 15:49   CT Head Wo Contrast  Result Date: 02/04/2022 CLINICAL DATA:  Dementia, lethargy EXAM: CT HEAD WITHOUT CONTRAST TECHNIQUE: Contiguous axial images were obtained from the base of the skull through the vertex without intravenous contrast. RADIATION DOSE REDUCTION: This exam was performed according to the departmental dose-optimization program which includes automated exposure control, adjustment of the mA and/or kV according to patient size and/or use of iterative  reconstruction technique. COMPARISON:  09/15/2020 FINDINGS: Brain: No acute infarct or hemorrhage. Stable hypodensities throughout the periventricular white matter consistent with chronic small vessel ischemic change. Lateral ventricles and midline structures are otherwise unremarkable. No acute extra-axial fluid collections. No mass effect. Vascular: No hyperdense vessel or unexpected calcification. Skull: Normal. Negative for fracture or focal lesion. Sinuses/Orbits: No acute finding. Other: None. IMPRESSION: 1. Stable head CT, no acute intracranial process. Electronically Signed   By: Randa Ngo M.D.   On: 02/04/2022 15:48     Subjective: - no chest pain, shortness of breath, no abdominal pain, nausea or vomiting.   Discharge Exam: BP 136/69 (BP Location: Right Arm)   Pulse 69   Temp 98.2 F (36.8 C)   Resp 18   Ht '6\' 1"'$  (1.854 m)   Wt 77.2 kg   SpO2 94%   BMI 22.45 kg/m   General: Pt is alert, awake, not in acute distress Cardiovascular: RRR, S1/S2 +, no rubs, no gallops Respiratory: CTA bilaterally, no wheezing, no rhonchi Abdominal: Soft, NT, ND, bowel sounds + Extremities: no edema, no cyanosis    The results of significant diagnostics from this hospitalization (including imaging, microbiology, ancillary and laboratory) are listed below for reference.     Microbiology: Recent Results (from the past 240 hour(s))  Resp panel by RT-PCR (RSV, Flu A&B, Covid) Anterior Nasal Swab     Status: Abnormal   Collection Time: 02/04/22  3:20 PM   Specimen: Anterior Nasal Swab  Result Value Ref Range  Status   SARS Coronavirus 2 by RT PCR POSITIVE (A) NEGATIVE Final    Comment: (NOTE) SARS-CoV-2 target nucleic acids are DETECTED.  The SARS-CoV-2 RNA is generally detectable in upper respiratory specimens during the acute phase of infection. Positive results are indicative of the presence of the identified virus, but do not rule out bacterial infection or co-infection with other  pathogens not detected by the test. Clinical correlation with patient history and other diagnostic information is necessary to determine patient infection status. The expected result is Negative.  Fact Sheet for Patients: EntrepreneurPulse.com.au  Fact Sheet for Healthcare Providers: IncredibleEmployment.be  This test is not yet approved or cleared by the [place] FDA and  has been authorized for detection and/or diagnosis of SARS-CoV-2 by FDA under an Emergency Use Authorization (EUA).  This EUA will remain in effect (meaning this test can be used) for the duration of  the COVID-19 declaration under Section 564(b)(1) of the A ct, 21 U.S.C. section 360bbb-3(b)(1), unless the authorization is terminated or revoked sooner.     Influenza A by PCR NEGATIVE NEGATIVE Final   Influenza B by PCR NEGATIVE NEGATIVE Final    Comment: (NOTE) The Xpert Xpress SARS-CoV-2/FLU/RSV plus assay is intended as an aid in the diagnosis of influenza from Nasopharyngeal swab specimens and should not be used as a sole basis for treatment. Nasal washings and aspirates are unacceptable for Xpert Xpress SARS-CoV-2/FLU/RSV testing.  Fact Sheet for Patients: EntrepreneurPulse.com.au  Fact Sheet for Healthcare Providers: IncredibleEmployment.be  This test is not yet approved or cleared by the [place] FDA and has been authorized for detection and/or diagnosis of SARS-CoV-2 by FDA under an Emergency Use Authorization (EUA). This EUA will remain in effect (meaning this test can be used) for the duration of the COVID-19 declaration under Section 564(b)(1) of the Act, 21 U.S.C. section 360bbb-3(b)(1), unless the authorization is terminated or revoked.     Resp Syncytial Virus by PCR NEGATIVE NEGATIVE Final    Comment: (NOTE) Fact Sheet for Patients: EntrepreneurPulse.com.au  Fact Sheet for Healthcare  Providers: IncredibleEmployment.be  This test is not yet approved or cleared by the [place] FDA and has been authorized for detection and/or diagnosis of SARS-CoV-2 by FDA under an Emergency Use Authorization (EUA). This EUA will remain in effect (meaning this test can be used) for the duration of the COVID-19 declaration under Section 564(b)(1) of the Act, 21 U.S.C. section 360bbb-3(b)(1), unless the authorization is terminated or revoked.  Performed at [facility], [address] [place], [place] [address]   Culture, blood (routine x 2)     Status: None   Collection Time: 02/04/22  3:21 PM   Specimen: BLOOD  Result Value Ref Range Status   Specimen Description BLOOD BLOOD RIGHT ARM  Final   Special Requests   Final    BOTTLES DRAWN AEROBIC AND ANAEROBIC Blood Culture adequate volume   Culture   Final    NO GROWTH 5 DAYS Performed at [facility], [address], [place], [place] [address]    Report Status 02/09/2022 FINAL  Final  Culture, blood (routine x 2)     Status: None   Collection Time: 02/04/22  5:47 PM   Specimen: BLOOD  Result Value Ref Range Status   Specimen Description BLOOD BLOOD RIGHT HAND  Final   Special Requests   Final    BOTTLES DRAWN AEROBIC AND ANAEROBIC Blood Culture adequate volume   Culture   Final    NO GROWTH  5 DAYS Performed at [facility], [address] [place],  [address]    Report Status 02/09/2022 FINAL  Final     Labs: Basic Metabolic Panel: Recent Labs  Lab 02/08/22 0450 02/09/22 0746 02/10/22 0546 02/11/22 0345 02/12/22 0432 02/13/22 0515  NA 142 141 141 140 140 141  K 3.7 3.7 4.1 4.2 4.0 4.1  CL 111 114* 115* 112* 113* 115*  CO2 22 21* 21* 20* 21* 21*  GLUCOSE 90 95 95 116* 99 97  BUN '12 16 15 13 16 16  '$ CREATININE 1.53* 1.42* 1.46* 1.56* 1.52* 1.60*  CALCIUM 7.9* 8.2* 8.2* 8.6* 8.4* 8.2*  MG 2.1  --   --   --   --   --   PHOS 3.2  --   --   --   --   --     Liver Function Tests: Recent Labs  Lab 02/08/22 0450  AST 50*  ALT 24  ALKPHOS 41  BILITOT 0.8  PROT 6.1*  ALBUMIN 3.1*   CBC: Recent Labs  Lab 02/09/22 0746 02/10/22 0546 02/11/22 0345 02/12/22 0432 02/13/22 0515  WBC 4.4 4.9 7.0 6.6 5.9  HGB 10.1* 10.0* 10.7* 9.8* 9.5*  HCT 30.3* 30.5* 32.3* 29.3* 28.4*  MCV 84.4 86.2 85.0 85.4 86.1  PLT 184 212 255 236 259   CBG: No results for input(s): "GLUCAP" in the last 168 hours. Hgb A1c No results for input(s): "HGBA1C" in the last 72 hours. Lipid Profile No results for input(s): "CHOL", "HDL", "LDLCALC", "TRIG", "CHOLHDL", "LDLDIRECT" in the last 72 hours. Thyroid function studies No results for input(s): "TSH", "T4TOTAL", "T3FREE", "THYROIDAB" in the last 72 hours.  Invalid input(s): "FREET3" Urinalysis    Component Value Date/Time   COLORURINE YELLOW (A) 02/04/2022 2125   APPEARANCEUR HAZY (A) 02/04/2022 2125   APPEARANCEUR Hazy 08/14/2013 2304   LABSPEC 1.044 (H) 02/04/2022 2125   LABSPEC 1.010 08/14/2013 2304   PHURINE 5.0 02/04/2022 2125   GLUCOSEU NEGATIVE 02/04/2022 2125   GLUCOSEU Negative 08/14/2013 2304   HGBUR LARGE (A) 02/04/2022 2125   BILIRUBINUR NEGATIVE 02/04/2022 2125   BILIRUBINUR Negative 08/14/2013 2304   KETONESUR 5 (A) 02/04/2022 2125   PROTEINUR >=300 (A) 02/04/2022 2125   NITRITE NEGATIVE 02/04/2022 2125   LEUKOCYTESUR NEGATIVE 02/04/2022 2125   LEUKOCYTESUR Negative 08/14/2013 2304    FURTHER DISCHARGE INSTRUCTIONS:   Get Medicines reviewed and adjusted: Please take all your medications with you for your next visit with your Primary MD   Laboratory/radiological data: Please request your Primary MD to go over all hospital tests and procedure/radiological results at the follow up, please ask your Primary MD to get all Hospital records sent to his/her office.   In some cases, they will be blood work, cultures and biopsy results pending at the time of your discharge. Please request  that your primary care M.D. goes through all the records of your hospital data and follows up on these results.   Also Note the following: If you experience worsening of your admission symptoms, develop shortness of breath, life threatening emergency, suicidal or homicidal thoughts you must seek medical attention immediately by calling 911 or calling your MD immediately  if symptoms less severe.   You must read complete instructions/literature along with all the possible adverse reactions/side effects for all the Medicines you take and that have been prescribed to you. Take any new Medicines after you have completely understood and accpet all the possible adverse reactions/side effects.  Do not drive when taking Pain medications or sleeping medications (Benzodaizepines)   Do not take more than prescribed Pain, Sleep and Anxiety Medications. It is not advisable to combine anxiety,sleep and pain medications without talking with your primary care practitioner   Special Instructions: If you have smoked or chewed Tobacco  in the last 2 yrs please stop smoking, stop any regular Alcohol  and or any Recreational drug use.   Wear Seat belts while driving.   Please note: You were cared for by a hospitalist during your hospital stay. Once you are discharged, your primary care physician will handle any further medical issues. Please note that NO REFILLS for any discharge medications will be authorized once you are discharged, as it is imperative that you return to your primary care physician (or establish a relationship with a primary care physician if you do not have one) for your post hospital discharge needs so that they can reassess your need for medications and monitor your lab values.  Time coordinating discharge: 40 minutes  SIGNED:  Marzetta Board, MD, PhD 02/14/2022, 1:55 PM

## 2022-02-18 DIAGNOSIS — M6282 Rhabdomyolysis: Secondary | ICD-10-CM | POA: Diagnosis not present

## 2022-02-18 DIAGNOSIS — U071 COVID-19: Secondary | ICD-10-CM | POA: Diagnosis not present

## 2022-02-18 DIAGNOSIS — E785 Hyperlipidemia, unspecified: Secondary | ICD-10-CM | POA: Diagnosis not present

## 2022-02-18 DIAGNOSIS — F0390 Unspecified dementia without behavioral disturbance: Secondary | ICD-10-CM | POA: Diagnosis not present

## 2022-02-18 DIAGNOSIS — C61 Malignant neoplasm of prostate: Secondary | ICD-10-CM | POA: Diagnosis not present

## 2022-02-18 DIAGNOSIS — I498 Other specified cardiac arrhythmias: Secondary | ICD-10-CM | POA: Diagnosis not present

## 2022-02-18 DIAGNOSIS — I10 Essential (primary) hypertension: Secondary | ICD-10-CM | POA: Diagnosis not present

## 2022-02-18 DIAGNOSIS — I5042 Chronic combined systolic (congestive) and diastolic (congestive) heart failure: Secondary | ICD-10-CM | POA: Diagnosis not present

## 2022-02-19 DIAGNOSIS — D509 Iron deficiency anemia, unspecified: Secondary | ICD-10-CM | POA: Diagnosis not present

## 2022-02-19 DIAGNOSIS — C61 Malignant neoplasm of prostate: Secondary | ICD-10-CM | POA: Diagnosis not present

## 2022-02-19 DIAGNOSIS — U071 COVID-19: Secondary | ICD-10-CM | POA: Diagnosis not present

## 2022-02-19 DIAGNOSIS — M6282 Rhabdomyolysis: Secondary | ICD-10-CM | POA: Diagnosis not present

## 2022-02-19 DIAGNOSIS — I10 Essential (primary) hypertension: Secondary | ICD-10-CM | POA: Diagnosis not present

## 2022-02-19 DIAGNOSIS — E785 Hyperlipidemia, unspecified: Secondary | ICD-10-CM | POA: Diagnosis not present

## 2022-02-19 DIAGNOSIS — I214 Non-ST elevation (NSTEMI) myocardial infarction: Secondary | ICD-10-CM | POA: Diagnosis not present

## 2022-02-19 DIAGNOSIS — G9341 Metabolic encephalopathy: Secondary | ICD-10-CM | POA: Diagnosis not present

## 2022-02-27 DIAGNOSIS — I5042 Chronic combined systolic (congestive) and diastolic (congestive) heart failure: Secondary | ICD-10-CM | POA: Diagnosis not present

## 2022-02-27 DIAGNOSIS — E785 Hyperlipidemia, unspecified: Secondary | ICD-10-CM | POA: Diagnosis not present

## 2022-02-27 DIAGNOSIS — E611 Iron deficiency: Secondary | ICD-10-CM | POA: Diagnosis not present

## 2022-02-27 DIAGNOSIS — I10 Essential (primary) hypertension: Secondary | ICD-10-CM | POA: Diagnosis not present

## 2022-02-27 DIAGNOSIS — F0390 Unspecified dementia without behavioral disturbance: Secondary | ICD-10-CM | POA: Diagnosis not present

## 2022-02-27 DIAGNOSIS — I498 Other specified cardiac arrhythmias: Secondary | ICD-10-CM | POA: Diagnosis not present

## 2022-02-27 DIAGNOSIS — R569 Unspecified convulsions: Secondary | ICD-10-CM | POA: Diagnosis not present

## 2022-02-27 DIAGNOSIS — C61 Malignant neoplasm of prostate: Secondary | ICD-10-CM | POA: Diagnosis not present

## 2022-02-27 DIAGNOSIS — R32 Unspecified urinary incontinence: Secondary | ICD-10-CM | POA: Diagnosis not present

## 2022-03-10 DIAGNOSIS — Z79899 Other long term (current) drug therapy: Secondary | ICD-10-CM | POA: Diagnosis not present

## 2022-03-10 DIAGNOSIS — Z8616 Personal history of COVID-19: Secondary | ICD-10-CM | POA: Diagnosis not present

## 2022-03-10 DIAGNOSIS — R569 Unspecified convulsions: Secondary | ICD-10-CM | POA: Diagnosis not present

## 2022-03-10 DIAGNOSIS — F0390 Unspecified dementia without behavioral disturbance: Secondary | ICD-10-CM | POA: Diagnosis not present

## 2022-03-10 DIAGNOSIS — R4182 Altered mental status, unspecified: Secondary | ICD-10-CM | POA: Diagnosis not present

## 2022-03-10 DIAGNOSIS — E538 Deficiency of other specified B group vitamins: Secondary | ICD-10-CM | POA: Diagnosis not present

## 2022-03-10 DIAGNOSIS — M6282 Rhabdomyolysis: Secondary | ICD-10-CM | POA: Diagnosis not present

## 2022-04-02 DIAGNOSIS — R32 Unspecified urinary incontinence: Secondary | ICD-10-CM | POA: Diagnosis not present

## 2022-04-15 DIAGNOSIS — Z79899 Other long term (current) drug therapy: Secondary | ICD-10-CM | POA: Diagnosis not present

## 2022-04-15 DIAGNOSIS — D509 Iron deficiency anemia, unspecified: Secondary | ICD-10-CM | POA: Diagnosis not present

## 2022-04-15 DIAGNOSIS — Z0000 Encounter for general adult medical examination without abnormal findings: Secondary | ICD-10-CM | POA: Diagnosis not present

## 2022-04-15 DIAGNOSIS — R569 Unspecified convulsions: Secondary | ICD-10-CM | POA: Diagnosis not present

## 2022-04-15 DIAGNOSIS — F0280 Dementia in other diseases classified elsewhere without behavioral disturbance: Secondary | ICD-10-CM | POA: Diagnosis not present

## 2022-04-15 DIAGNOSIS — I10 Essential (primary) hypertension: Secondary | ICD-10-CM | POA: Diagnosis not present

## 2022-04-15 DIAGNOSIS — E785 Hyperlipidemia, unspecified: Secondary | ICD-10-CM | POA: Diagnosis not present

## 2022-04-15 DIAGNOSIS — E538 Deficiency of other specified B group vitamins: Secondary | ICD-10-CM | POA: Diagnosis not present

## 2022-04-24 DIAGNOSIS — R32 Unspecified urinary incontinence: Secondary | ICD-10-CM | POA: Diagnosis not present

## 2022-05-29 DIAGNOSIS — R32 Unspecified urinary incontinence: Secondary | ICD-10-CM | POA: Diagnosis not present

## 2022-06-19 DIAGNOSIS — R32 Unspecified urinary incontinence: Secondary | ICD-10-CM | POA: Diagnosis not present

## 2022-07-21 ENCOUNTER — Other Ambulatory Visit: Payer: Self-pay | Admitting: Family Medicine

## 2022-07-24 DIAGNOSIS — E7800 Pure hypercholesterolemia, unspecified: Secondary | ICD-10-CM | POA: Diagnosis not present

## 2022-07-24 DIAGNOSIS — D519 Vitamin B12 deficiency anemia, unspecified: Secondary | ICD-10-CM | POA: Diagnosis not present

## 2022-07-24 DIAGNOSIS — I10 Essential (primary) hypertension: Secondary | ICD-10-CM | POA: Diagnosis not present

## 2022-07-24 DIAGNOSIS — D508 Other iron deficiency anemias: Secondary | ICD-10-CM | POA: Diagnosis not present

## 2022-07-24 DIAGNOSIS — R2681 Unsteadiness on feet: Secondary | ICD-10-CM | POA: Diagnosis not present

## 2022-07-24 DIAGNOSIS — R569 Unspecified convulsions: Secondary | ICD-10-CM | POA: Diagnosis not present

## 2022-07-24 DIAGNOSIS — Z79899 Other long term (current) drug therapy: Secondary | ICD-10-CM | POA: Diagnosis not present

## 2022-07-25 ENCOUNTER — Other Ambulatory Visit: Payer: Medicare HMO

## 2022-07-25 DIAGNOSIS — C61 Malignant neoplasm of prostate: Secondary | ICD-10-CM | POA: Diagnosis not present

## 2022-07-25 DIAGNOSIS — R32 Unspecified urinary incontinence: Secondary | ICD-10-CM | POA: Diagnosis not present

## 2022-07-26 LAB — PSA: Prostate Specific Ag, Serum: <0.1 ng/mL (ref 0.0–4.0)

## 2022-07-28 ENCOUNTER — Telehealth: Payer: Self-pay | Admitting: *Deleted

## 2022-07-28 NOTE — Telephone Encounter (Signed)
Left message per DPR 

## 2022-07-28 NOTE — Telephone Encounter (Signed)
-----   Message from Riki Altes, MD sent at 07/27/2022 11:22 AM EDT ----- PSA remains undetectable at <0.1.  Keep follow-up December 2024 as scheduled

## 2022-08-28 DIAGNOSIS — E538 Deficiency of other specified B group vitamins: Secondary | ICD-10-CM | POA: Diagnosis not present

## 2022-09-12 DIAGNOSIS — R32 Unspecified urinary incontinence: Secondary | ICD-10-CM | POA: Diagnosis not present

## 2022-09-29 DIAGNOSIS — E538 Deficiency of other specified B group vitamins: Secondary | ICD-10-CM | POA: Diagnosis not present

## 2022-10-31 DIAGNOSIS — E7800 Pure hypercholesterolemia, unspecified: Secondary | ICD-10-CM | POA: Diagnosis not present

## 2022-10-31 DIAGNOSIS — Z79899 Other long term (current) drug therapy: Secondary | ICD-10-CM | POA: Diagnosis not present

## 2022-10-31 DIAGNOSIS — I10 Essential (primary) hypertension: Secondary | ICD-10-CM | POA: Diagnosis not present

## 2022-10-31 DIAGNOSIS — R569 Unspecified convulsions: Secondary | ICD-10-CM | POA: Diagnosis not present

## 2022-10-31 DIAGNOSIS — Z0000 Encounter for general adult medical examination without abnormal findings: Secondary | ICD-10-CM | POA: Diagnosis not present

## 2022-11-14 DIAGNOSIS — R32 Unspecified urinary incontinence: Secondary | ICD-10-CM | POA: Diagnosis not present

## 2022-12-16 DIAGNOSIS — R32 Unspecified urinary incontinence: Secondary | ICD-10-CM | POA: Diagnosis not present

## 2023-01-22 DIAGNOSIS — R32 Unspecified urinary incontinence: Secondary | ICD-10-CM | POA: Diagnosis not present

## 2023-01-23 ENCOUNTER — Other Ambulatory Visit: Payer: Medicare HMO

## 2023-01-26 ENCOUNTER — Ambulatory Visit: Payer: Medicare HMO | Admitting: Urology

## 2023-02-02 DIAGNOSIS — Z87891 Personal history of nicotine dependence: Secondary | ICD-10-CM | POA: Diagnosis not present

## 2023-02-02 DIAGNOSIS — N1832 Chronic kidney disease, stage 3b: Secondary | ICD-10-CM | POA: Diagnosis not present

## 2023-02-02 DIAGNOSIS — Z79899 Other long term (current) drug therapy: Secondary | ICD-10-CM | POA: Diagnosis not present

## 2023-02-02 DIAGNOSIS — R569 Unspecified convulsions: Secondary | ICD-10-CM | POA: Diagnosis not present

## 2023-02-02 DIAGNOSIS — E7800 Pure hypercholesterolemia, unspecified: Secondary | ICD-10-CM | POA: Diagnosis not present

## 2023-02-02 DIAGNOSIS — I129 Hypertensive chronic kidney disease with stage 1 through stage 4 chronic kidney disease, or unspecified chronic kidney disease: Secondary | ICD-10-CM | POA: Diagnosis not present

## 2023-03-28 ENCOUNTER — Other Ambulatory Visit: Payer: Self-pay

## 2023-03-28 ENCOUNTER — Emergency Department
Admission: EM | Admit: 2023-03-28 | Discharge: 2023-03-28 | Disposition: A | Discharge status: Home / Self Care | Payer: Medicare HMO | Attending: Emergency Medicine | Admitting: Emergency Medicine

## 2023-03-28 DIAGNOSIS — R569 Unspecified convulsions: Secondary | ICD-10-CM | POA: Insufficient documentation

## 2023-03-28 DIAGNOSIS — I10 Essential (primary) hypertension: Secondary | ICD-10-CM | POA: Insufficient documentation

## 2023-03-28 DIAGNOSIS — R5383 Other fatigue: Secondary | ICD-10-CM

## 2023-03-28 DIAGNOSIS — Z20822 Contact with and (suspected) exposure to covid-19: Secondary | ICD-10-CM | POA: Insufficient documentation

## 2023-03-28 DIAGNOSIS — F0390 Unspecified dementia without behavioral disturbance: Secondary | ICD-10-CM | POA: Diagnosis not present

## 2023-03-28 DIAGNOSIS — R410 Disorientation, unspecified: Secondary | ICD-10-CM | POA: Diagnosis not present

## 2023-03-28 DIAGNOSIS — R000 Tachycardia, unspecified: Secondary | ICD-10-CM | POA: Diagnosis not present

## 2023-03-28 DIAGNOSIS — R531 Weakness: Secondary | ICD-10-CM | POA: Diagnosis not present

## 2023-03-28 DIAGNOSIS — R0689 Other abnormalities of breathing: Secondary | ICD-10-CM | POA: Diagnosis not present

## 2023-03-28 LAB — BASIC METABOLIC PANEL
Anion gap: 10 (ref 5–15)
BUN: 19 mg/dL (ref 8–23)
CO2: 20 mmol/L — ABNORMAL LOW (ref 22–32)
Calcium: 8.6 mg/dL — ABNORMAL LOW (ref 8.9–10.3)
Chloride: 108 mmol/L (ref 98–111)
Creatinine, Ser: 1.81 mg/dL — ABNORMAL HIGH (ref 0.61–1.24)
GFR, Estimated: 38 mL/min — ABNORMAL LOW (ref >60)
Glucose, Bld: 154 mg/dL — ABNORMAL HIGH (ref 70–99)
Potassium: 4 mmol/L (ref 3.5–5.1)
Sodium: 138 mmol/L (ref 135–145)

## 2023-03-28 LAB — CBC
HCT: 26.4 % — ABNORMAL LOW (ref 39.0–52.0)
Hemoglobin: 8.5 g/dL — ABNORMAL LOW (ref 13.0–17.0)
MCH: 28.9 pg (ref 26.0–34.0)
MCHC: 32.2 g/dL (ref 30.0–36.0)
MCV: 89.8 fL (ref 80.0–100.0)
Platelets: 356 10*3/uL (ref 150–400)
RBC: 2.94 MIL/uL — ABNORMAL LOW (ref 4.22–5.81)
RDW: 15.9 % — ABNORMAL HIGH (ref 11.5–15.5)
WBC: 15.2 10*3/uL — ABNORMAL HIGH (ref 4.0–10.5)
nRBC: 0 % (ref 0.0–0.2)

## 2023-03-28 LAB — URINALYSIS, ROUTINE W REFLEX MICROSCOPIC
Bacteria, UA: NONE SEEN
Bilirubin Urine: NEGATIVE
Glucose, UA: NEGATIVE mg/dL
Ketones, ur: NEGATIVE mg/dL
Leukocytes,Ua: NEGATIVE
Nitrite: NEGATIVE
Protein, ur: 30 mg/dL — AB
Specific Gravity, Urine: 1.013 (ref 1.005–1.030)
pH: 5 (ref 5.0–8.0)

## 2023-03-28 LAB — RESP PANEL BY RT-PCR (RSV, FLU A&B, COVID)  RVPGX2
Influenza A by PCR: NEGATIVE
Influenza B by PCR: NEGATIVE
Resp Syncytial Virus by PCR: NEGATIVE
SARS Coronavirus 2 by RT PCR: NEGATIVE

## 2023-03-28 MED ORDER — ACETAMINOPHEN 500 MG PO TABS
1000.0000 mg | ORAL_TABLET | Freq: Once | ORAL | Status: AC
  Administered 2023-03-28: 1000 mg via ORAL
  Filled 2023-03-28: qty 2

## 2023-03-28 MED ORDER — ONDANSETRON 4 MG PO TBDP: 4.0000 mg | ORAL_TABLET | Freq: Three times a day (TID) | ORAL | 0 refills | Status: DC | PRN

## 2023-03-28 MED ORDER — ONDANSETRON 4 MG PO TBDP
4.0000 mg | ORAL_TABLET | Freq: Once | ORAL | Status: AC
  Administered 2023-03-28: 4 mg via ORAL
  Filled 2023-03-28: qty 1

## 2023-03-28 NOTE — ED Notes (Signed)
 Spoke with [facility] staff member and he advised a staff member will be here in 35 minutes to transport him back to the group home.

## 2023-03-28 NOTE — ED Provider Notes (Signed)
 [facility] Provider Note    Event Date/Time   First MD Initiated Contact with Patient 03/28/23 1418     (approximate)   History   Chief Complaint: Weakness   HPI  Jeremy [name] is a 78 y.o. male with a history of seizures, hypertension, dementia who was sent to the ED from his group home due to decreased energy level and decreased oral intake over the last 3 days.  Patient denies any pain.  Denies any shortness of breath or cough or fever, no complaints.  Denies nausea or throwing up.  States that he feels fine          Physical Exam   Triage Vital Signs: ED Triage Vitals  Encounter Vitals Group     BP 03/28/23 1222 125/80     Systolic BP Percentile --      Diastolic BP Percentile --      Pulse Rate 03/28/23 1222 100     Resp 03/28/23 1222 18     Temp 03/28/23 1222 98.2 F (36.8 C)     Temp Source 03/28/23 1222 Oral     SpO2 03/28/23 1222 100 %     Weight 03/28/23 1223 175 lb (79.4 kg)     Height 03/28/23 1223 6' 1 (1.854 m)     Head Circumference --      Peak Flow --      Pain Score --      Pain Loc --      Pain Education --      Exclude from Growth Chart --     Most recent vital signs: Vitals:   03/28/23 1222  BP: 125/80  Pulse: 100  Resp: 18  Temp: 98.2 F (36.8 C)  SpO2: 100%    General: Awake, no distress.  CV:  Good peripheral perfusion.  Regular rate rhythm Resp:  Normal effort.  Clear to auscultation bilaterally Abd:  No distention.  Soft nontender Other:  No lower extremity edema.  Moist oral mucosa   ED Results / Procedures / Treatments   Labs (all labs ordered are listed, but only abnormal results are displayed) Labs Reviewed  BASIC METABOLIC PANEL - Abnormal; Notable for the following components:      Result Value   CO2 20 (*)    Glucose, Bld 154 (*)    Creatinine, Ser 1.81 (*)    Calcium  8.6 (*)    GFR, Estimated 38 (*)    All other components within normal limits  CBC - Abnormal; Notable  for the following components:   WBC 15.2 (*)    RBC 2.94 (*)    Hemoglobin 8.5 (*)    HCT 26.4 (*)    RDW 15.9 (*)    All other components within normal limits  URINALYSIS, ROUTINE W REFLEX MICROSCOPIC - Abnormal; Notable for the following components:   Color, Urine YELLOW (*)    APPearance CLEAR (*)    Hgb urine dipstick SMALL (*)    Protein, ur 30 (*)    All other components within normal limits  RESP PANEL BY RT-PCR (RSV, FLU A&B, COVID)  RVPGX2  CBG MONITORING, ED     EKG Interpreted by me Sinus rhythm rate of 88.  Normal axis and intervals.  LVH.  No acute ischemic changes.   RADIOLOGY    PROCEDURES:  Procedures   MEDICATIONS ORDERED IN ED: Medications  acetaminophen  (TYLENOL ) tablet 1,000 mg (1,000 mg Oral Given 03/28/23 1548)  ondansetron  (ZOFRAN -ODT) disintegrating tablet  4 mg (4 mg Oral Given 03/28/23 1548)     IMPRESSION / MDM / ASSESSMENT AND PLAN / ED COURSE  I reviewed the triage vital signs and the nursing notes.  DDx: Electrolyte derangement, AKI, anemia, UTI, COVID, influenza, other viral illness  Patient's presentation is most consistent with acute presentation with potential threat to life or bodily function.  Patient sent to the ED due to fatigue and malaise.  No acute complaints currently, reassuring vitals and exam.  Lab panel unremarkable with baseline anemia and CKD.  Denies any indication for advanced imaging currently, no infection to suggest antibiotics.  Stable for discharge and outpatient follow-up with PCP.       FINAL CLINICAL IMPRESSION(S) / ED DIAGNOSES   Final diagnoses:  Fatigue, unspecified type  Chronic dementia (HCC)     Rx / DC Orders   ED Discharge Orders          Ordered    ondansetron  (ZOFRAN -ODT) 4 MG disintegrating tablet  Every 8 hours PRN        03/28/23 1705             Note:  This document was prepared using Dragon voice recognition software and may include unintentional dictation errors.    Viviann Pastor, MD 03/28/23 [phone]

## 2023-03-28 NOTE — ED Triage Notes (Signed)
 Arrived by [facility] from group home. Staff reports not getting out of bed and eating/drinking like normal  Reports weakness X3 days.   EMS vitals: 133/71 b/p 108HR 100% RA 174CBG  History dementia - staff reports at baseline

## 2023-03-28 NOTE — Discharge Instructions (Signed)
 Your lab test today were all okay.  There is no sign of a serious infection or other new health issues.  Take Tylenol  and Zofran  as needed for pain or nausea, and follow-up with your doctor.

## 2023-03-28 NOTE — ED Notes (Signed)
Male purwick placed on patient.

## 2023-03-28 NOTE — ED Notes (Signed)
 Drank and swallowed without issues.

## 2023-03-28 NOTE — ED Notes (Signed)
 [name] owner picked pt up via personal car.

## 2023-04-09 DIAGNOSIS — R32 Unspecified urinary incontinence: Secondary | ICD-10-CM | POA: Diagnosis not present

## 2023-05-13 ENCOUNTER — Inpatient Hospital Stay
Admission: EM | Admit: 2023-05-13 | Discharge: 2023-05-21 | DRG: 436 | Disposition: A | Discharge status: Hospice | Attending: Internal Medicine | Admitting: Internal Medicine

## 2023-05-13 ENCOUNTER — Other Ambulatory Visit: Payer: Self-pay

## 2023-05-13 ENCOUNTER — Emergency Department

## 2023-05-13 ENCOUNTER — Encounter: Payer: Self-pay | Admitting: Intensive Care

## 2023-05-13 DIAGNOSIS — I959 Hypotension, unspecified: Secondary | ICD-10-CM | POA: Diagnosis not present

## 2023-05-13 DIAGNOSIS — F0390 Unspecified dementia without behavioral disturbance: Secondary | ICD-10-CM | POA: Diagnosis present

## 2023-05-13 DIAGNOSIS — Z515 Encounter for palliative care: Secondary | ICD-10-CM

## 2023-05-13 DIAGNOSIS — Z66 Do not resuscitate: Secondary | ICD-10-CM | POA: Diagnosis present

## 2023-05-13 DIAGNOSIS — J90 Pleural effusion, not elsewhere classified: Secondary | ICD-10-CM | POA: Diagnosis not present

## 2023-05-13 DIAGNOSIS — N1832 Chronic kidney disease, stage 3b: Secondary | ICD-10-CM | POA: Diagnosis present

## 2023-05-13 DIAGNOSIS — I429 Cardiomyopathy, unspecified: Secondary | ICD-10-CM | POA: Diagnosis present

## 2023-05-13 DIAGNOSIS — I10 Essential (primary) hypertension: Secondary | ICD-10-CM | POA: Diagnosis not present

## 2023-05-13 DIAGNOSIS — R21 Rash and other nonspecific skin eruption: Secondary | ICD-10-CM | POA: Diagnosis not present

## 2023-05-13 DIAGNOSIS — C439 Malignant melanoma of skin, unspecified: Secondary | ICD-10-CM | POA: Diagnosis present

## 2023-05-13 DIAGNOSIS — I5042 Chronic combined systolic (congestive) and diastolic (congestive) heart failure: Secondary | ICD-10-CM | POA: Diagnosis present

## 2023-05-13 DIAGNOSIS — D630 Anemia in neoplastic disease: Secondary | ICD-10-CM | POA: Diagnosis present

## 2023-05-13 DIAGNOSIS — Z8546 Personal history of malignant neoplasm of prostate: Secondary | ICD-10-CM

## 2023-05-13 DIAGNOSIS — R32 Unspecified urinary incontinence: Secondary | ICD-10-CM | POA: Diagnosis not present

## 2023-05-13 DIAGNOSIS — R64 Cachexia: Secondary | ICD-10-CM | POA: Diagnosis present

## 2023-05-13 DIAGNOSIS — R627 Adult failure to thrive: Secondary | ICD-10-CM | POA: Diagnosis present

## 2023-05-13 DIAGNOSIS — Z6820 Body mass index (BMI) 20.0-20.9, adult: Secondary | ICD-10-CM

## 2023-05-13 DIAGNOSIS — R634 Abnormal weight loss: Secondary | ICD-10-CM | POA: Diagnosis not present

## 2023-05-13 DIAGNOSIS — E785 Hyperlipidemia, unspecified: Secondary | ICD-10-CM | POA: Diagnosis present

## 2023-05-13 DIAGNOSIS — R160 Hepatomegaly, not elsewhere classified: Secondary | ICD-10-CM | POA: Diagnosis not present

## 2023-05-13 DIAGNOSIS — R413 Other amnesia: Secondary | ICD-10-CM | POA: Diagnosis present

## 2023-05-13 DIAGNOSIS — R590 Localized enlarged lymph nodes: Secondary | ICD-10-CM | POA: Diagnosis present

## 2023-05-13 DIAGNOSIS — E538 Deficiency of other specified B group vitamins: Secondary | ICD-10-CM | POA: Diagnosis present

## 2023-05-13 DIAGNOSIS — R464 Slowness and poor responsiveness: Secondary | ICD-10-CM | POA: Diagnosis not present

## 2023-05-13 DIAGNOSIS — D649 Anemia, unspecified: Secondary | ICD-10-CM | POA: Diagnosis present

## 2023-05-13 DIAGNOSIS — C61 Malignant neoplasm of prostate: Secondary | ICD-10-CM | POA: Diagnosis not present

## 2023-05-13 DIAGNOSIS — Z7982 Long term (current) use of aspirin: Secondary | ICD-10-CM | POA: Diagnosis not present

## 2023-05-13 DIAGNOSIS — N179 Acute kidney failure, unspecified: Principal | ICD-10-CM | POA: Diagnosis present

## 2023-05-13 DIAGNOSIS — C801 Malignant (primary) neoplasm, unspecified: Secondary | ICD-10-CM | POA: Diagnosis present

## 2023-05-13 DIAGNOSIS — R932 Abnormal findings on diagnostic imaging of liver and biliary tract: Secondary | ICD-10-CM | POA: Diagnosis not present

## 2023-05-13 DIAGNOSIS — K439 Ventral hernia without obstruction or gangrene: Secondary | ICD-10-CM | POA: Diagnosis present

## 2023-05-13 DIAGNOSIS — C786 Secondary malignant neoplasm of retroperitoneum and peritoneum: Secondary | ICD-10-CM | POA: Diagnosis present

## 2023-05-13 DIAGNOSIS — E860 Dehydration: Secondary | ICD-10-CM | POA: Diagnosis present

## 2023-05-13 DIAGNOSIS — G40909 Epilepsy, unspecified, not intractable, without status epilepticus: Secondary | ICD-10-CM | POA: Diagnosis present

## 2023-05-13 DIAGNOSIS — R7889 Finding of other specified substances, not normally found in blood: Secondary | ICD-10-CM | POA: Diagnosis not present

## 2023-05-13 DIAGNOSIS — J439 Emphysema, unspecified: Secondary | ICD-10-CM | POA: Diagnosis not present

## 2023-05-13 DIAGNOSIS — Z9079 Acquired absence of other genital organ(s): Secondary | ICD-10-CM

## 2023-05-13 DIAGNOSIS — R918 Other nonspecific abnormal finding of lung field: Secondary | ICD-10-CM | POA: Diagnosis not present

## 2023-05-13 DIAGNOSIS — Z923 Personal history of irradiation: Secondary | ICD-10-CM | POA: Diagnosis not present

## 2023-05-13 DIAGNOSIS — I130 Hypertensive heart and chronic kidney disease with heart failure and stage 1 through stage 4 chronic kidney disease, or unspecified chronic kidney disease: Secondary | ICD-10-CM | POA: Diagnosis present

## 2023-05-13 DIAGNOSIS — Z79899 Other long term (current) drug therapy: Secondary | ICD-10-CM | POA: Diagnosis not present

## 2023-05-13 DIAGNOSIS — I2510 Atherosclerotic heart disease of native coronary artery without angina pectoris: Secondary | ICD-10-CM | POA: Diagnosis present

## 2023-05-13 DIAGNOSIS — I700 Atherosclerosis of aorta: Secondary | ICD-10-CM | POA: Diagnosis not present

## 2023-05-13 DIAGNOSIS — C799 Secondary malignant neoplasm of unspecified site: Secondary | ICD-10-CM

## 2023-05-13 DIAGNOSIS — C787 Secondary malignant neoplasm of liver and intrahepatic bile duct: Principal | ICD-10-CM | POA: Diagnosis present

## 2023-05-13 DIAGNOSIS — Z87891 Personal history of nicotine dependence: Secondary | ICD-10-CM | POA: Diagnosis not present

## 2023-05-13 DIAGNOSIS — D72829 Elevated white blood cell count, unspecified: Secondary | ICD-10-CM | POA: Diagnosis present

## 2023-05-13 HISTORY — DX: Malignant neoplasm of prostate: C61

## 2023-05-13 HISTORY — DX: Chronic combined systolic (congestive) and diastolic (congestive) heart failure: I50.42

## 2023-05-13 HISTORY — DX: Hyperlipidemia, unspecified: E78.5

## 2023-05-13 HISTORY — DX: Atherosclerotic heart disease of native coronary artery without angina pectoris: I25.10

## 2023-05-13 HISTORY — DX: Anemia, unspecified: D64.9

## 2023-05-13 LAB — CBC WITH DIFFERENTIAL/PLATELET
Abs Immature Granulocytes: 0.15 10*3/uL — ABNORMAL HIGH (ref 0.00–0.07)
Basophils Absolute: 0 10*3/uL (ref 0.0–0.1)
Basophils Relative: 0 %
Eosinophils Absolute: 0.3 10*3/uL (ref 0.0–0.5)
Eosinophils Relative: 2 %
HCT: 24.3 % — ABNORMAL LOW (ref 39.0–52.0)
Hemoglobin: 7.7 g/dL — ABNORMAL LOW (ref 13.0–17.0)
Immature Granulocytes: 1 %
Lymphocytes Relative: 22 %
Lymphs Abs: 2.6 10*3/uL (ref 0.7–4.0)
MCH: 27.7 pg (ref 26.0–34.0)
MCHC: 31.7 g/dL (ref 30.0–36.0)
MCV: 87.4 fL (ref 80.0–100.0)
Monocytes Absolute: 1 10*3/uL (ref 0.1–1.0)
Monocytes Relative: 8 %
Neutro Abs: 8.2 10*3/uL — ABNORMAL HIGH (ref 1.7–7.7)
Neutrophils Relative %: 67 %
Platelets: 245 10*3/uL (ref 150–400)
RBC: 2.78 MIL/uL — ABNORMAL LOW (ref 4.22–5.81)
RDW: 19.2 % — ABNORMAL HIGH (ref 11.5–15.5)
WBC: 12.3 10*3/uL — ABNORMAL HIGH (ref 4.0–10.5)
nRBC: 0 % (ref 0.0–0.2)

## 2023-05-13 LAB — COMPREHENSIVE METABOLIC PANEL
ALT: 33 U/L (ref 0–44)
AST: 64 U/L — ABNORMAL HIGH (ref 15–41)
Albumin: 2.4 g/dL — ABNORMAL LOW (ref 3.5–5.0)
Alkaline Phosphatase: 434 U/L — ABNORMAL HIGH (ref 38–126)
Anion gap: 11 (ref 5–15)
BUN: 35 mg/dL — ABNORMAL HIGH (ref 8–23)
CO2: 19 mmol/L — ABNORMAL LOW (ref 22–32)
Calcium: 8.8 mg/dL — ABNORMAL LOW (ref 8.9–10.3)
Chloride: 107 mmol/L (ref 98–111)
Creatinine, Ser: 3.24 mg/dL — ABNORMAL HIGH (ref 0.61–1.24)
GFR, Estimated: 19 mL/min — ABNORMAL LOW (ref >60)
Glucose, Bld: 120 mg/dL — ABNORMAL HIGH (ref 70–99)
Potassium: 4.6 mmol/L (ref 3.5–5.1)
Sodium: 137 mmol/L (ref 135–145)
Total Bilirubin: 0.7 mg/dL (ref 0.0–1.2)
Total Protein: 6.5 g/dL (ref 6.5–8.1)

## 2023-05-13 LAB — RETICULOCYTES
Immature Retic Fract: 34.7 % — ABNORMAL HIGH (ref 2.3–15.9)
RBC.: 2.78 MIL/uL — ABNORMAL LOW (ref 4.22–5.81)
Retic Count, Absolute: 53.7 10*3/uL (ref 19.0–186.0)
Retic Ct Pct: 1.9 % (ref 0.4–3.1)

## 2023-05-13 LAB — BRAIN NATRIURETIC PEPTIDE: B Natriuretic Peptide: 124.2 pg/mL — ABNORMAL HIGH (ref 0.0–100.0)

## 2023-05-13 LAB — FOLATE: Folate: 4.1 ng/mL — ABNORMAL LOW (ref >5.9)

## 2023-05-13 LAB — PROTIME-INR
INR: 1.3 — ABNORMAL HIGH (ref 0.8–1.2)
Prothrombin Time: 16.7 s — ABNORMAL HIGH (ref 11.4–15.2)

## 2023-05-13 LAB — IRON AND TIBC
Iron: 34 ug/dL — ABNORMAL LOW (ref 45–182)
Saturation Ratios: 21 % (ref 17.9–39.5)
TIBC: 164 ug/dL — ABNORMAL LOW (ref 250–450)
UIBC: 130 ug/dL

## 2023-05-13 LAB — FERRITIN: Ferritin: 904 ng/mL — ABNORMAL HIGH (ref 24–336)

## 2023-05-13 LAB — APTT: aPTT: 26 s (ref 24–36)

## 2023-05-13 MED ORDER — ACETAMINOPHEN 325 MG PO TABS: 650.0000 mg | ORAL_TABLET | Freq: Four times a day (QID) | ORAL | Status: DC | PRN

## 2023-05-13 MED ORDER — LORAZEPAM 2 MG/ML IJ SOLN: 2.0000 mg | INTRAMUSCULAR | Status: DC | PRN

## 2023-05-13 MED ORDER — LOPERAMIDE HCL 2 MG PO CAPS: 2.0000 mg | ORAL_CAPSULE | Freq: Four times a day (QID) | ORAL | Status: DC | PRN

## 2023-05-13 MED ORDER — ROSUVASTATIN CALCIUM 10 MG PO TABS
10.0000 mg | ORAL_TABLET | Freq: Every day | ORAL | Status: DC
  Administered 2023-05-14 – 2023-05-19 (×5): 10 mg via ORAL
  Filled 2023-05-13 (×6): qty 1

## 2023-05-13 MED ORDER — RISPERIDONE 1 MG PO TABS
1.0000 mg | ORAL_TABLET | Freq: Three times a day (TID) | ORAL | Status: DC
  Administered 2023-05-13 – 2023-05-19 (×15): 1 mg via ORAL
  Filled 2023-05-13 (×21): qty 1

## 2023-05-13 MED ORDER — TAMSULOSIN HCL 0.4 MG PO CAPS
0.4000 mg | ORAL_CAPSULE | Freq: Every day | ORAL | Status: DC
Start: 2023-05-14 — End: 2023-05-21
  Administered 2023-05-14 – 2023-05-19 (×6): 0.4 mg via ORAL
  Filled 2023-05-13 (×7): qty 1

## 2023-05-13 MED ORDER — HYDROCODONE-ACETAMINOPHEN 5-325 MG PO TABS
1.0000 | ORAL_TABLET | Freq: Four times a day (QID) | ORAL | Status: DC | PRN
  Administered 2023-05-15: 1 via ORAL
  Filled 2023-05-13: qty 1

## 2023-05-13 MED ORDER — HYDRALAZINE HCL 20 MG/ML IJ SOLN: 5.0000 mg | INTRAMUSCULAR | Status: DC | PRN

## 2023-05-13 MED ORDER — LEVETIRACETAM 500 MG PO TABS
500.0000 mg | ORAL_TABLET | Freq: Two times a day (BID) | ORAL | Status: DC
  Administered 2023-05-13 – 2023-05-21 (×13): 500 mg via ORAL
  Filled 2023-05-13 (×15): qty 1

## 2023-05-13 MED ORDER — LAMOTRIGINE 25 MG PO TABS
25.0000 mg | ORAL_TABLET | Freq: Two times a day (BID) | ORAL | Status: DC
  Administered 2023-05-13 – 2023-05-21 (×13): 25 mg via ORAL
  Filled 2023-05-13 (×15): qty 1

## 2023-05-13 MED ORDER — SODIUM CHLORIDE 0.9 % IV SOLN: INTRAVENOUS | Status: AC

## 2023-05-13 MED ORDER — LACTATED RINGERS IV BOLUS
1000.0000 mL | Freq: Once | INTRAVENOUS | Status: AC
  Administered 2023-05-13: 1000 mL via INTRAVENOUS

## 2023-05-13 MED ORDER — FERROUS SULFATE 325 (65 FE) MG PO TABS
325.0000 mg | ORAL_TABLET | Freq: Two times a day (BID) | ORAL | Status: DC
  Administered 2023-05-14 – 2023-05-19 (×11): 325 mg via ORAL
  Filled 2023-05-13 (×12): qty 1

## 2023-05-13 MED ORDER — CARVEDILOL 6.25 MG PO TABS
6.2500 mg | ORAL_TABLET | Freq: Two times a day (BID) | ORAL | Status: DC
  Administered 2023-05-14 – 2023-05-19 (×11): 6.25 mg via ORAL
  Filled 2023-05-13 (×12): qty 1

## 2023-05-13 MED ORDER — ONDANSETRON HCL 4 MG/2ML IJ SOLN: 4.0000 mg | Freq: Three times a day (TID) | INTRAMUSCULAR | Status: DC | PRN

## 2023-05-13 MED ORDER — ASPIRIN 81 MG PO TBEC: 81.0000 mg | DELAYED_RELEASE_TABLET | Freq: Every day | ORAL | Status: DC

## 2023-05-13 MED ORDER — LOSARTAN POTASSIUM 50 MG PO TABS: 25.0000 mg | ORAL_TABLET | Freq: Every day | ORAL | Status: DC

## 2023-05-13 NOTE — ED Triage Notes (Signed)
 Patient brought in by [facility] from [facility] for abnormal hemoglobin and kidney failure.   Patient denies pain  Patient asking about tennis shoes. No tennis shoes found with patient in triage

## 2023-05-13 NOTE — H&P (Signed)
 History and Physical    [name] QIO:[id number] DOB: [birth year] DOA: 05/13/2023  Referring MD/NP/PA:   PCP: Marguarite Arbour, MD   Patient coming from:  The patient is coming from group home.     Chief Complaint: Abnormal lab, weight loss  HPI: Jeremy [name] is a 78 y.o. male with medical history significant of prostate cancer, sCHF with EF 40-45%, HTN, HLD, CAD, dementia, anemia, seizure, CKD 3B, cocaine abuse, who presents with abnormal lab and weight loss.  Patient has hx of dementia, and can only provide limited history, therefore, most of the history is obtained by discussing the case with ED physician, per EMS report, and with the nursing staff. Pt is from group home and presented with abnormal labs with worsening renal function and hemoglobin 5.9.  The CBC done in ED showed hemoglobin 7.7.  Patient had hemoglobin 8.5 on 03/28/2023. Pt denies dark stool or rectal bleeding.  No nausea, vomiting, diarrhea or abdominal pain, but his abdomen is distended on examination.  Patient does not have acute respiratory distress, no active cough or shortness  of breath.  Denies chest pain.  Denies symptoms of UTI.  Per report, patient has poor appetite and decreased oral intake recently, with weight loss.  Data reviewed independently and ED Course: pt was found to have WBC 12.3, BNP 124, ESR 128, worsening renal function with creatinine 3.24, BUN 35, GFR 19 (recent baseline creatinine 1.81 on 03/28/2023), abnormal liver function (ALP 434, AST 64, ALT 33, total bilirubin 0.7).  Temperature normal, blood pressure 133/66, heart rate 77, RR 14, oxygen saturation 100% on room air.  Chest x-ray negative.  CT of head negative. Pt is admitted to telemetry bed as inpatient.  Message sent to Dr. Smith Robert of oncology for consult.   CT abdomen/pelvis: 1. Concern for neoplastic and metastatic disease. The liver is enlarged and diffusely heterogeneous. Concern for multiple liver lesions. In addition,  there is new right inguinal lymphadenopathy. Concern for small peritoneal nodules that could represent peritoneal metastatic disease. In addition, there are new small pulmonary nodules. Potentially, the liver and right inguinal region could be amendable for ultrasound-guided biopsy. 2. Small ventral hernias containing fat. 3. Question nephrolithiasis without hydronephrosis.   EKG: I have personally reviewed.  Sinus rhythm, QTc 423, LAE, early R wave progression.   Review of Systems: Could not be reviewed accurately due to dementia  Allergy: No Known Allergies  Past Medical History:  Diagnosis Date   Anemia    CAD (coronary artery disease)    Cancer (HCC) 2019   prostate /radiation   Chronic combined systolic and diastolic CHF (congestive heart failure) (HCC)    Dementia (HCC)    HLD (hyperlipidemia)    Hypertension    Prostate cancer (HCC)    Seizures (HCC)    last seizure 2020    Past Surgical History:  Procedure Laterality Date   FLEXOR TENDON REPAIR Right 02/12/2021   Procedure: Right hand contracture release - Flexor release, pinning of PIP joints;  Surgeon: Kennedy Bucker, MD;  Location: [facility] ORS;  Service: Orthopedics;  Laterality: Right;   PROSTATE BIOPSY N/A 12/15/2017   Procedure: BIOPSY TRANSRECTAL ULTRASONIC PROSTATE (TUBP);  Surgeon: Riki Altes, MD;  Location: [facility] ORS;  Service: Urology;  Laterality: N/A;   ROBOT ASSISTED INGUINAL HERNIA REPAIR Right 09/02/2018   Procedure: ROBOT ASSISTED INGUINAL HERNIA REPAIR AND UMBILICAL HERNIA, RIGHT;  Surgeon: Leafy Ro, MD;  Location: [facility] ORS;  Service: General;  Laterality: Right;  Social History:  reports that he has quit smoking. His smoking use included cigarettes. He has never used smokeless tobacco. He reports that he does not currently use alcohol. He reports that he does not currently use drugs after having used the following drugs: Cocaine and IV.  Family History: History reviewed. No pertinent  family history.  Could not be reviewed accurately due to dementia  Prior to Admission medications   Medication Sig Start Date End Date Taking? Authorizing Provider  acetaminophen (TYLENOL) 325 MG tablet Take 650 mg by mouth every 4 (four) hours as needed for moderate pain, fever or headache.    [provider]  alum & mag hydroxide-simeth (MAALOX/MYLANTA) 200-200-20 MG/5ML suspension Take 30 mLs by mouth daily as needed for indigestion or heartburn.    [provider]  aspirin EC 81 MG tablet Take 1 tablet (81 mg total) by mouth daily. Swallow whole. 02/15/22   Leatha Gilding, MD  CALCIUM + VITAMIN D3 600-10 MG-MCG TABS TAKE 1 TABLET BY MOUTH 2 TIMES DAILY 01/14/21   Stoioff, Verna Czech, MD  carvedilol (COREG) 6.25 MG tablet Take 1 tablet (6.25 mg total) by mouth 2 (two) times daily with a meal. 02/14/22   Gherghe, Daylene Katayama, MD  cyanocobalamin (,VITAMIN B-12,) 1000 MCG/ML injection Inject 1,000 mcg into the muscle every 30 (thirty) days.    [provider]  ferrous sulfate 325 (65 FE) MG EC tablet Take 1 tablet (325 mg total) by mouth 2 (two) times daily. 02/14/22 02/14/23  Leatha Gilding, MD  guaiFENesin (ROBITUSSIN) 100 MG/5ML liquid Take 200 mg by mouth every 6 (six) hours as needed for cough.    [provider]  HYDROcodone-acetaminophen (NORCO) 5-325 MG tablet Take 1-2 tablets by mouth every 6 (six) hours as needed for moderate pain. 02/14/22   Leatha Gilding, MD  lamoTRIgine (LAMICTAL) 25 MG tablet Take 25 mg by mouth 2 (two) times daily. 03/02/18   [provider]  levETIRAcetam (KEPPRA) 500 MG tablet Take 500 mg by mouth 2 (two) times daily. 01/27/19 02/04/22  [provider]  loperamide (IMODIUM) 2 MG capsule Take 2 mg by mouth 4 (four) times daily as needed for diarrhea or loose stools.    [provider]  losartan (COZAAR) 25 MG tablet Take 1 tablet (25 mg total) by mouth daily. 02/15/22   Leatha Gilding, MD  magnesium  hydroxide (MILK OF MAGNESIA) 400 MG/5ML suspension Take 30 mLs by mouth daily as needed for mild constipation.    [provider]  ondansetron (ZOFRAN-ODT) 4 MG disintegrating tablet Take 1 tablet (4 mg total) by mouth every 8 (eight) hours as needed for nausea or vomiting. 03/28/23   Sharman Cheek, MD  risperiDONE (RISPERDAL) 2 MG tablet Take 1 mg by mouth 3 (three) times daily.    [provider]  rosuvastatin (CRESTOR) 10 MG tablet Take 10 mg by mouth daily. 11/02/19 02/04/22  [provider]  tamsulosin (FLOMAX) 0.4 MG CAPS capsule Take 0.4 mg by mouth daily after supper. 07/01/19   [provider]    Physical Exam: Vitals:   05/13/23 1915 05/13/23 2008 05/13/23 2100 05/13/23 2131  BP: 128/62 133/66 133/63   Pulse: 76 77 84 82  Resp: 17 14 17 17   Temp:    97.7 F (36.5 C)  TempSrc:    Oral  SpO2: 100% 100% 100% 98%  Weight:      Height:       General: Not in acute distress.  Dry mucous membrane HEENT:       Eyes: PERRL, EOMI, no jaundice       ENT: No discharge from the ears and nose       Neck: No JVD, no bruit, no mass felt. Heme: No neck lymph node enlargement. Cardiac: S1/S2, RRR, No murmurs, No gallops or rubs. Respiratory: No rales, wheezing, rhonchi or rubs. GI: has mild abdominal distention, nontender, no organomegaly, BS present. GU: No hematuria Ext: No pitting leg edema bilaterally. 1+DP/PT pulse bilaterally. Musculoskeletal: No joint deformities, No joint redness or warmth, no limitation of ROM in spin. Skin: No rashes.  Neuro: Lethargic, arousable, knows his own name, not orientated to place and time (unsure about his baseline mental status).  Cranial nerves II-XII grossly intact, moves all extremities. Psych: Patient is not psychotic, no suicidal or hemocidal ideation.  Labs on Admission: I have personally reviewed following labs and imaging studies  CBC: Recent Labs  Lab 05/13/23 1824  WBC 12.3*  NEUTROABS 8.2*  HGB  7.7*  HCT 24.3*  MCV 87.4  PLT 245   Basic Metabolic Panel: Recent Labs  Lab 05/13/23 1824  NA 137  K 4.6  CL 107  CO2 19*  GLUCOSE 120*  BUN 35*  CREATININE 3.24*  CALCIUM 8.8*   GFR: Estimated Creatinine Clearance: 18.4 mL/min (A) (by C-G formula based on SCr of 3.24 mg/dL (H)). Liver Function Tests: Recent Labs  Lab 05/13/23 1824  AST 64*  ALT 33  ALKPHOS 434*  BILITOT 0.7  PROT 6.5  ALBUMIN 2.4*   No results for input(s): "LIPASE", "AMYLASE" in the last 168 hours. No results for input(s): "AMMONIA" in the last 168 hours. Coagulation Profile: Recent Labs  Lab 05/13/23 2222  INR 1.3*   Cardiac Enzymes: No results for input(s): "CKTOTAL", "CKMB", "CKMBINDEX", "TROPONINI" in the last 168 hours. BNP (last 3 results) No results for input(s): "PROBNP" in the last 8760 hours. HbA1C: No results for input(s): "HGBA1C" in the last 72 hours. CBG: No results for input(s): "GLUCAP" in the last 168 hours. Lipid Profile: No results for input(s): "CHOL", "HDL", "LDLCALC", "TRIG", "CHOLHDL", "LDLDIRECT" in the last 72 hours. Thyroid Function Tests: No results for input(s): "TSH", "T4TOTAL", "FREET4", "T3FREE", "THYROIDAB" in the last 72 hours. Anemia Panel: Recent Labs    05/13/23 1824  FOLATE 4.1*  FERRITIN 904*  TIBC 164*  IRON 34*  RETICCTPCT 1.9   Urine analysis:    Component Value Date/Time   COLORURINE YELLOW (A) 03/28/2023 1441   APPEARANCEUR CLEAR (A) 03/28/2023 1441   APPEARANCEUR Hazy 08/14/2013 2304   LABSPEC 1.013 03/28/2023 1441   LABSPEC 1.010 08/14/2013 2304   PHURINE 5.0 03/28/2023 1441   GLUCOSEU NEGATIVE 03/28/2023 1441   GLUCOSEU Negative 08/14/2013 2304   HGBUR SMALL (A) 03/28/2023 1441   BILIRUBINUR NEGATIVE 03/28/2023 1441   BILIRUBINUR Negative 08/14/2013 2304   KETONESUR NEGATIVE 03/28/2023 1441   PROTEINUR 30 (A) 03/28/2023 1441   NITRITE NEGATIVE 03/28/2023 1441   LEUKOCYTESUR NEGATIVE 03/28/2023 1441   LEUKOCYTESUR Negative  08/14/2013 2304   Sepsis Labs: @LABRCNTIP (procalcitonin:4,lacticidven:4) )No results found for this or any previous visit (from the past 240 hours).   Radiological Exams on Admission:   Assessment/Plan Principal Problem:   Metastasized cancer Active Problems:   Prostate cancer (HCC)   Acute kidney injury superimposed on stage 3b chronic kidney disease (HCC)   Normocytic anemia   CAD (coronary artery disease)   Chronic combined systolic and diastolic CHF (congestive heart failure) (HCC)   Leukocytosis  Dementia without behavioral disturbance (HCC)   Assessment and Plan:   Metastasized cancer: CT scan showed metastasized disease to liver, peritoneum, with new right inguinal lymphadenopathy.  Unclear primary cancer, the patient has a history of prostate cancer.  Message sent to Dr. Smith Robert for consult.  -Admitted to telemetry bed as inpatient -Check PSA and AFP -INR/PTT/type screen -Palliative consult  History of prostate cancer [facility]): S/p of radiation therapy and prostatectomy -Continue Flomax -Follow-up PSA  Acute kidney injury superimposed on stage 3b chronic kidney disease (HCC): Likely due to dehydration and continuation of Cozaar.  No hydronephrosis per CT scan -Hold Cozaar -Avoid using renal toxic medications -IV fluid: 1 L LR, then 75 cc/h  Normocytic anemia: Hemoglobin 7.7 (8.5 on 03/28/2023).  No rectal bleeding or dark stool. -Check anemia panel -Follow-up with CBC  CAD (coronary artery disease): -Hold aspirin due to worsening anemia -Crestor  Chronic combined systolic and diastolic CHF (congestive heart failure) (HCC): 2D echo on 02/05/2022 showed EF of 40-45% with grade 1 diastolic dysfunction.  Patient does not have leg edema or JVD.  BNP 124.  CHF seem to be compensated. -Watch volume status closely  Leukocytosis: WBC 12.3.  No fever, no source of infection identified.  Likely reactive. -Follow-up with CBC  Dementia without behavioral disturbance [facility]):  Patient is calm -Fall precaution        DVT ppx: SCD  Code Status: Full code   Family Communication:     not done, no family member is at bed side.     Disposition Plan:  Anticipate discharge back to previous environment, group home  Consults called:  Message sent to Dr. Smith Robert of oncology for consult.  Admission status and Level of care: Telemetry Medical:   as inpt        Dispo: The patient is from: Group home              Anticipated d/c is to: Group home              Anticipated d/c date is: 2 days              Patient currently is not medically stable to d/c.    Severity of Illness:  The appropriate patient status for this patient is INPATIENT. Inpatient status is judged to be reasonable and necessary in order to provide the required intensity of service to ensure the patient's safety. The patient's presenting symptoms, physical exam findings, and initial radiographic and laboratory data in the context of their chronic comorbidities is felt to place them at high risk for further clinical deterioration. Furthermore, it is not anticipated that the patient will be medically stable for discharge from the hospital within 2 midnights of admission.   * I certify that at the point of admission it is my clinical judgment that the patient will require inpatient hospital care spanning beyond 2 midnights from the point of admission due to high intensity of service, high risk for further deterioration and high frequency of surveillance required.*       Date of Service 05/13/2023    Lorretta Harp [facility]   If 7PM-7AM, please contact night-coverage [email or web address] 05/13/2023, 11:09 PM

## 2023-05-13 NOTE — ED Provider Notes (Signed)
 [facility] Provider Note    Event Date/Time   First MD Initiated Contact with Patient 05/13/23 1806     (approximate)   History   Abnormal Lab   HPI  Jeremy [name] is a 78 y.o. male who presents to the ED for evaluation of Abnormal Lab   Reviewed PCP visit from December and acute visit with the PA earlier this morning.  At baseline history of seizure disorder on Keppra, CKD, HTN, HLD and memory loss .  Seen acutely for weight loss, poor appetite and a new rash.  I review outpatient blood work sent from this visit with an elevated ESR to 128, normocytic anemia with hemoglobin of 5.9, AKI with creatinine of 3.3/GFR 19 and quite high alkaline phosphatase at 508 but normal bilirubin, low LFTs.  Looks like baseline creatinine is around 1.5-1.8.  Patient presents to the ED due to these abnormal lab values.  He is unable to provide any relevant history, laying in bed and reporting he feels all right.  History is limited but supplemented by patient's caregiver and group home administrator but I speak with on the phone and is known the patient for the past 6 years, documented below.   Physical Exam   Triage Vital Signs: ED Triage Vitals  Encounter Vitals Group     BP 05/13/23 1705 105/64     Systolic BP Percentile --      Diastolic BP Percentile --      Pulse Rate 05/13/23 1705 76     Resp 05/13/23 1705 16     Temp 05/13/23 1705 98 F (36.7 C)     Temp Source 05/13/23 1705 Oral     SpO2 05/13/23 1705 98 %     Weight 05/13/23 1702 150 lb (68 kg)     Height 05/13/23 1702 6' (1.829 m)     Head Circumference --      Peak Flow --      Pain Score 05/13/23 1702 0     Pain Loc --      Pain Education --      Exclude from Growth Chart --     Most recent vital signs: Vitals:   05/13/23 1915 05/13/23 2008  BP: 128/62 133/66  Pulse: 76 77  Resp: 17 14  Temp:    SpO2: 100% 100%    General: Awake, no distress.  Laying on his side and appears weak,  not consistently following commands. CV:  Good peripheral perfusion.  Resp:  Normal effort.  Abd:  No distention.  MSK:  No deformity noted.  Neuro:  No focal deficits appreciated. Other:     ED Results / Procedures / Treatments   Labs (all labs ordered are listed, but only abnormal results are displayed) Labs Reviewed  CBC WITH DIFFERENTIAL/PLATELET - Abnormal; Notable for the following components:      Result Value   WBC 12.3 (*)    RBC 2.78 (*)    Hemoglobin 7.7 (*)    HCT 24.3 (*)    RDW 19.2 (*)    Neutro Abs 8.2 (*)    Abs Immature Granulocytes 0.15 (*)    All other components within normal limits  COMPREHENSIVE METABOLIC PANEL - Abnormal; Notable for the following components:   CO2 19 (*)    Glucose, Bld 120 (*)    BUN 35 (*)    Creatinine, Ser 3.24 (*)    Calcium 8.8 (*)    Albumin 2.4 (*)  AST 64 (*)    Alkaline Phosphatase 434 (*)    GFR, Estimated 19 (*)    All other components within normal limits  TYPE AND SCREEN    EKG   RADIOLOGY CT abdomen/pelvis interpreted by me with signs of metastatic disease in the liver CXR interpreted by me without evidence of acute cardiopulmonary pathology. CT head interpreted by me without evidence of acute intracranial pathology  Official radiology report(s): CT ABDOMEN PELVIS WO CONTRAST Result Date: 05/13/2023 CLINICAL DATA:  Acute kidney injury. Loss of appetite. Weight loss. Evaluate for malignancy. EXAM: CT ABDOMEN AND PELVIS WITHOUT CONTRAST TECHNIQUE: Multidetector CT imaging of the abdomen and pelvis was performed following the standard protocol without IV contrast. RADIATION DOSE REDUCTION: This exam was performed according to the departmental dose-optimization program which includes automated exposure control, adjustment of the mA and/or kV according to patient size and/or use of iterative reconstruction technique. COMPARISON:  01/28/2018 and chest CTA 02/04/2022 FINDINGS: Lower chest: Irregular pulmonary nodule  in right middle lobe measures 6 mm on image 9/5 is stable since 2023. Scarring or atelectasis in the posterior right lower lobe. New pulmonary nodules at the posterior left lower lobe on image 18/5, largest measures 6 mm. Question another new nodule in the right middle lobe region on image 4/5 that measures 3 mm. Hepatobiliary: Liver is enlarged and diffusely heterogeneous. Nodular contour of the liver. Findings are concerning for multiple hepatic lesions. Largest lesion appears to be in the posterior right hepatic lobe on image 43/3 that measures roughly 2.8 cm. Limited evaluation for biliary dilatation on this study without intravascular contrast. Gallbladder is not distended. Pancreas: Unremarkable. No pancreatic ductal dilatation or surrounding inflammatory changes. Spleen: Normal in size without focal abnormality. Adrenals/Urinary Tract: Normal adrenal glands. High-density material in the right kidney lower pole region could represent small stones. No hydronephrosis. Calcification in the left kidney lower pole may be in the cortex. Probable left renal cysts but poorly characterized on this exam without intravascular contrast. Mild bladder wall thickening is likely chronic. Stomach/Bowel: Normal appearance of the stomach. Normal appearance of the small bowel without dilatation. No gross abnormality in the colon. No evidence for acute bowel inflammation. Vascular/Lymphatic: New right inguinal lymphadenopathy. Right inguinal lymphadenopathy seen on image 90/3. Mildly enlarged right pelvic sidewall lymph node on image 70/3 measures 0.9 cm in the short axis. Atherosclerotic disease in the abdominal aorta without aneurysm. Reproductive: Prostate is unremarkable. Other: Evidence for previous right inguinal hernia repair. Small new nodular densities with mild stranding in the left paracolic region on image 42/3 and image 44/3. Largest peritoneal nodule measures 1.0 cm. Findings are concerning for peritoneal disease in  this area. There appears to be 2 small periumbilical hernias containing fat. Question a small peritoneal nodule in the right lower abdomen on image 62/3. Negative for free fluid. Negative for free air. Musculoskeletal: Disc space narrowing and degenerative changes in lower lumbar spine. No suspicious bone lesions. IMPRESSION: 1. Concern for neoplastic and metastatic disease. The liver is enlarged and diffusely heterogeneous. Concern for multiple liver lesions. In addition, there is new right inguinal lymphadenopathy. Concern for small peritoneal nodules that could represent peritoneal metastatic disease. In addition, there are new small pulmonary nodules. Potentially, the liver and right inguinal region could be amendable for ultrasound-guided biopsy. 2. Small ventral hernias containing fat. 3. Question nephrolithiasis without hydronephrosis. These results were called by telephone at the time of interpretation on 05/13/2023 at 8:44 pm to provider [facility] , who verbally acknowledged these results.  Electronically Signed   By: Richarda Overlie M.D.   On: 05/13/2023 20:47   DG Chest Portable 1 View Result Date: 05/13/2023 CLINICAL DATA:  poorly responsive poorly responsive and abnormal lab. Hx of HTN and former smoker. EXAM: PORTABLE CHEST 1 VIEW COMPARISON:  Chest x-ray 02/04/2022, CT chest 02/04/2022 FINDINGS: The heart and mediastinal contours are unchanged. Atherosclerotic plaque. Chronic coarsened markings. No focal consolidation. No pulmonary edema. No pleural effusion. No pneumothorax. No acute osseous abnormality. IMPRESSION: 1. No active disease. 2.  Aortic Atherosclerosis (ICD10-I70.0). Electronically Signed   By: Tish Frederickson M.D.   On: 05/13/2023 20:32   CT HEAD WO CONTRAST ( ) Result Date: 05/13/2023 CLINICAL DATA:  poor responsiveness, eval ich, cva EXAM: CT HEAD WITHOUT CONTRAST TECHNIQUE: Contiguous axial images were obtained from the base of the skull through the vertex without intravenous  contrast. RADIATION DOSE REDUCTION: This exam was performed according to the departmental dose-optimization program which includes automated exposure control, adjustment of the mA and/or kV according to patient size and/or use of iterative reconstruction technique. COMPARISON:  CT head 02/04/2022. FINDINGS: Brain: No evidence of acute infarction, hemorrhage, hydrocephalus, extra-axial collection or mass lesion/mass effect. Vascular: No hyperdense vessel. Skull: No acute fracture. Sinuses/Orbits: Clear sinuses.  No acute orbital findings. Other: No mastoid effusions. IMPRESSION: No evidence of acute intracranial abnormality. Electronically Signed   By: Feliberto Harts M.D.   On: 05/13/2023 20:23    PROCEDURES and INTERVENTIONS:  .1-3 Lead EKG Interpretation  Performed by: Delton Prairie, MD Authorized by: Delton Prairie, MD     Interpretation: normal     ECG rate:  80   ECG rate assessment: normal     Rhythm: sinus rhythm     Ectopy: none     Conduction: normal     Medications  lactated ringers bolus 1,000 mL (1,000 mLs Intravenous New Bag/Given 05/13/23 1910)     IMPRESSION / MDM / ASSESSMENT AND PLAN / ED COURSE  I reviewed the triage vital signs and the nursing notes.  Differential diagnosis includes, but is not limited to, blood loss anemia, AKI, sepsis, ureteral obstruction or other obstructive uropathy, new cancer  {Patient presents with symptoms of an acute illness or injury that is potentially life-threatening.  Patient presents to the ED due to abnormal outpatient blood work with signs of an AKI and metastatic malignancy of uncertain etiology requiring medical admission.  Patient is limited from the patient, majority of history is from his group home who is fairly with the patient for the past 6 years.  Loss of appetite, weight loss and generalized symptoms for the past week or so.  AKI is noted as well as significantly elevated alk phos and mild elevation of LFTs.  Normocytic  anemia is noted, no bleeding symptoms but no indications for transfusions here.  CT with signs of metastatic disease of uncertain etiology.  Will consult medicine for admission.  Family updated.  Clinical Course as of 05/13/23 2101  Wed May 13, 2023  1845 I call primary contact, [name] , he's had pt for 6 years at his home. Lost all appetite, refusing food, always saying he's full. 3-4 days.  Doristine Mango was with patient today at PCP office. Rash on his chest 1-2 days.  [DS]  1856 Labs here notable for AKI but hemoglobin here is not under the transfusion threshold [DS]  2043 Call from radiology.  Has cancer everywhere. [DS]  2046 I call Doristine Mango back to update him.  [DS]  2048 I call  patient's brother, [name], on documented number.  He lives in [place] and is appreciative of the phone call.  He would like to be kept in the loop and he is the family for decision making for the patient.  He is agreeable with admission and plan of care at this point [DS]    Clinical Course User Index [DS] Delton Prairie, MD     FINAL CLINICAL IMPRESSION(S) / ED DIAGNOSES   Final diagnoses:  AKI (acute kidney injury) [facility])  Metastatic malignant neoplasm, unspecified site [facility])     Rx / DC Orders   ED Discharge Orders     None        Note:  This document was prepared using Dragon voice recognition software and may include unintentional dictation errors.   Delton Prairie, MD 05/13/23 2102

## 2023-05-13 NOTE — ED Notes (Signed)
 CCMD called and pt placed on cardiac monitoring.

## 2023-05-13 NOTE — ED Notes (Signed)
 Lab called for blood draw.

## 2023-05-13 NOTE — ED Notes (Signed)
 Pt to CT

## 2023-05-13 NOTE — ED Notes (Signed)
 Lab called at this time and states they do not have pt's blood that was sent earlier.

## 2023-05-13 NOTE — ED Triage Notes (Signed)
 First Nurse Note: Patient to ED via [facility] for abnormal labs. MD called Care Home and states he had blood loss and kidney failure. Pt has not complaints at this time. VS WNL.

## 2023-05-14 ENCOUNTER — Inpatient Hospital Stay

## 2023-05-14 ENCOUNTER — Encounter: Payer: Self-pay | Admitting: Internal Medicine

## 2023-05-14 DIAGNOSIS — Z515 Encounter for palliative care: Secondary | ICD-10-CM

## 2023-05-14 DIAGNOSIS — C801 Malignant (primary) neoplasm, unspecified: Secondary | ICD-10-CM | POA: Diagnosis not present

## 2023-05-14 LAB — BASIC METABOLIC PANEL WITH GFR
Anion gap: 9 (ref 5–15)
BUN: 33 mg/dL — ABNORMAL HIGH (ref 8–23)
CO2: 20 mmol/L — ABNORMAL LOW (ref 22–32)
Calcium: 8.4 mg/dL — ABNORMAL LOW (ref 8.9–10.3)
Chloride: 107 mmol/L (ref 98–111)
Creatinine, Ser: 2.82 mg/dL — ABNORMAL HIGH (ref 0.61–1.24)
GFR, Estimated: 22 mL/min — ABNORMAL LOW (ref >60)
Glucose, Bld: 100 mg/dL — ABNORMAL HIGH (ref 70–99)
Potassium: 4.9 mmol/L (ref 3.5–5.1)
Sodium: 136 mmol/L (ref 135–145)

## 2023-05-14 LAB — CBC
HCT: 20.7 % — ABNORMAL LOW (ref 39.0–52.0)
Hemoglobin: 6.5 g/dL — ABNORMAL LOW (ref 13.0–17.0)
MCH: 27 pg (ref 26.0–34.0)
MCHC: 31.4 g/dL (ref 30.0–36.0)
MCV: 85.9 fL (ref 80.0–100.0)
Platelets: 240 10*3/uL (ref 150–400)
RBC: 2.41 MIL/uL — ABNORMAL LOW (ref 4.22–5.81)
RDW: 19.1 % — ABNORMAL HIGH (ref 11.5–15.5)
WBC: 10.8 10*3/uL — ABNORMAL HIGH (ref 4.0–10.5)
nRBC: 0 % (ref 0.0–0.2)

## 2023-05-14 LAB — PREPARE RBC (CROSSMATCH)

## 2023-05-14 LAB — ABO/RH: ABO/RH(D): O POS

## 2023-05-14 LAB — VITAMIN B12: Vitamin B-12: 3869 pg/mL — ABNORMAL HIGH (ref 180–914)

## 2023-05-14 MED ORDER — FENTANYL CITRATE (PF) 100 MCG/2ML IJ SOLN
INTRAMUSCULAR | Status: AC | PRN
  Administered 2023-05-14: 25 ug via INTRAVENOUS

## 2023-05-14 MED ORDER — SODIUM CHLORIDE 0.9% IV SOLUTION
Freq: Once | INTRAVENOUS | Status: AC
  Filled 2023-05-14: qty 250

## 2023-05-14 MED ORDER — FENTANYL CITRATE (PF) 100 MCG/2ML IJ SOLN
INTRAMUSCULAR | Status: AC
  Filled 2023-05-14: qty 2

## 2023-05-14 MED ORDER — FOLIC ACID 1 MG PO TABS
1.0000 mg | ORAL_TABLET | Freq: Every day | ORAL | Status: DC
  Administered 2023-05-14 – 2023-05-19 (×5): 1 mg via ORAL
  Filled 2023-05-14 (×6): qty 1

## 2023-05-14 MED ORDER — LIDOCAINE HCL (PF) 1 % IJ SOLN
10.0000 mL | Freq: Once | INTRAMUSCULAR | Status: AC
  Administered 2023-05-14: 10 mL via INTRADERMAL

## 2023-05-14 NOTE — Progress Notes (Signed)
 Patient clinically unchanged post US liver biopsy per Dr Milford Cage, tolerated well, only Fentanyl 25 mcg IV given for procedure along with local. No visible bleeding noted post procedure. Report given to care ED nurse post procedure.

## 2023-05-14 NOTE — Consult Note (Signed)
 Palliative Medicine [facility] at [facility] [phone] [phone]   Name: Jeremy [name] Date: 05/14/2023 MRN: [id number]  DOB: [birth year]  Patient Care Team: Marguarite Arbour, MD as PCP - General (Internal Medicine) Carmina Miller, MD as Radiation Oncologist (Radiation Oncology)    REASON FOR CONSULTATION: Jeremy [name] is a 78 y.o. male with multiple medical problems including dementia, cardiomyopathy with EF of 40 to 45% and history of CHF, history of seizure, CKD 3B, and history of prostate cancer status post RT and ADT (ADT completed 10/2020).  Patient presented to hospital for evaluation of weight loss and symptomatic anemia.  CT of the abdomen and pelvis revealed multiple liver lesions, inguinal lymphadenopathy and peritoneal nodules and small pulmonary nodules concerning for metastatic disease.  Palliative care consulted to address goals.  SOCIAL HISTORY:     reports that he has quit smoking. His smoking use included cigarettes. He has never used smokeless tobacco. He reports that he does not currently use alcohol. He reports that he does not currently use drugs after having used the following drugs: Cocaine and IV.  Patient was never married and has no children.  Has lives in a group home for past 6 years.  Has two brothers, one in at [place] and the other in [place].  Patient previously worked in the [employer].  ADVANCE DIRECTIVES:  Does not have  CODE STATUS: Full code  PAST MEDICAL HISTORY: Past Medical History:  Diagnosis Date   Anemia    CAD (coronary artery disease)    Cancer (HCC) 2019   prostate /radiation   Chronic combined systolic and diastolic CHF (congestive heart failure) (HCC)    Dementia (HCC)    HLD (hyperlipidemia)    Hypertension    Prostate cancer (HCC)    Seizures (HCC)    last seizure 2020    PAST SURGICAL HISTORY:  Past Surgical History:  Procedure Laterality Date    FLEXOR TENDON REPAIR Right 02/12/2021   Procedure: Right hand contracture release - Flexor release, pinning of PIP joints;  Surgeon: Kennedy Bucker, MD;  Location: [facility] ORS;  Service: Orthopedics;  Laterality: Right;   PROSTATE BIOPSY N/A 12/15/2017   Procedure: BIOPSY TRANSRECTAL ULTRASONIC PROSTATE (TUBP);  Surgeon: Riki Altes, MD;  Location: [facility] ORS;  Service: Urology;  Laterality: N/A;   ROBOT ASSISTED INGUINAL HERNIA REPAIR Right 09/02/2018   Procedure: ROBOT ASSISTED INGUINAL HERNIA REPAIR AND UMBILICAL HERNIA, RIGHT;  Surgeon: Leafy Ro, MD;  Location: [facility] ORS;  Service: General;  Laterality: Right;    HEMATOLOGY/ONCOLOGY HISTORY:  Oncology History   No history exists.    ALLERGIES:  has no known allergies.  MEDICATIONS:  Current Facility-Administered Medications  Medication Dose Route Frequency Provider Last Rate Last Admin   0.9 %  sodium chloride infusion   Intravenous Continuous Lorretta Harp, MD 75 mL/hr at 05/14/23 0807 Restarted at 05/14/23 8469   acetaminophen (TYLENOL) tablet 650 mg  650 mg Oral Q6H PRN Lorretta Harp, MD       carvedilol (COREG) tablet 6.25 mg  6.25 mg Oral BID WC Lorretta Harp, MD   6.25 mg at 05/14/23 0801   ferrous sulfate tablet 325 mg  325 mg Oral BID WC Lorretta Harp, MD   325 mg at 05/14/23 0801   folic acid (FOLVITE) tablet 1 mg  1 mg Oral Daily Creig Hines, MD   1 mg at 05/14/23 1106   hydrALAZINE (APRESOLINE) injection 5 mg  5 mg Intravenous Q2H PRN Lorretta Harp, MD       HYDROcodone-acetaminophen (NORCO/VICODIN) 5-325 MG per tablet 1-2 tablet  1-2 tablet Oral Q6H PRN Lorretta Harp, MD       lamoTRIgine (LAMICTAL) tablet 25 mg  25 mg Oral BID Lorretta Harp, MD   25 mg at 05/14/23 1031   levETIRAcetam (KEPPRA) tablet 500 mg  500 mg Oral BID Lorretta Harp, MD   500 mg at 05/14/23 1031   loperamide (IMODIUM) capsule 2 mg  2 mg Oral QID PRN Lorretta Harp, MD       LORazepam (ATIVAN) injection 2 mg  2 mg Intravenous Q2H PRN Lorretta Harp, MD       ondansetron  [facility]) injection 4 mg  4 mg Intravenous Q8H PRN Lorretta Harp, MD       risperiDONE (RISPERDAL) tablet 1 mg  1 mg Oral TID Lorretta Harp, MD   1 mg at 05/14/23 1031   rosuvastatin (CRESTOR) tablet 10 mg  10 mg Oral Daily Lorretta Harp, MD   10 mg at 05/14/23 1031   tamsulosin (FLOMAX) capsule 0.4 mg  0.4 mg Oral QPC supper Lorretta Harp, MD       Current Outpatient Medications  Medication Sig Dispense Refill   acetaminophen (TYLENOL) 325 MG tablet Take 650 mg by mouth every 4 (four) hours as needed for moderate pain, fever or headache.     alum & mag hydroxide-simeth (MAALOX/MYLANTA) 200-200-20 MG/5ML suspension Take 30 mLs by mouth daily as needed for indigestion or heartburn.     aspirin EC 81 MG tablet Take 1 tablet (81 mg total) by mouth daily. Swallow whole. 30 tablet 12   CALCIUM + VITAMIN D3 600-10 MG-MCG TABS TAKE 1 TABLET BY MOUTH 2 TIMES DAILY 60 tablet 0   carvedilol (COREG) 6.25 MG tablet Take 1 tablet (6.25 mg total) by mouth 2 (two) times daily with a meal.     cyanocobalamin (,VITAMIN B-12,) 1000 MCG/ML injection Inject 1,000 mcg into the muscle every 30 (thirty) days.     ferrous sulfate 325 (65 FE) MG EC tablet Take 1 tablet (325 mg total) by mouth 2 (two) times daily. 60 tablet 3   guaiFENesin (ROBITUSSIN) 100 MG/5ML liquid Take 200 mg by mouth every 6 (six) hours as needed for cough.     HYDROcodone-acetaminophen (NORCO) 5-325 MG tablet Take 1-2 tablets by mouth every 6 (six) hours as needed for moderate pain. 10 tablet 0   lamoTRIgine (LAMICTAL) 25 MG tablet Take 25 mg by mouth 2 (two) times daily.     levETIRAcetam (KEPPRA) 500 MG tablet Take 500 mg by mouth 2 (two) times daily.     loperamide (IMODIUM) 2 MG capsule Take 2 mg by mouth 4 (four) times daily as needed for diarrhea or loose stools.     losartan (COZAAR) 25 MG tablet Take 1 tablet (25 mg total) by mouth daily.     magnesium hydroxide (MILK OF MAGNESIA) 400 MG/5ML suspension Take 30 mLs by mouth daily as needed for mild  constipation.     ondansetron (ZOFRAN-ODT) 4 MG disintegrating tablet Take 1 tablet (4 mg total) by mouth every 8 (eight) hours as needed for nausea or vomiting. 20 tablet 0   risperiDONE (RISPERDAL) 2 MG tablet Take 1 mg by mouth 3 (three) times daily.     rosuvastatin (CRESTOR) 10 MG tablet Take 10 mg by mouth daily.     tamsulosin (FLOMAX) 0.4 MG CAPS capsule Take 0.4 mg by mouth daily after  supper.      VITAL SIGNS: BP (!) 130/58   Pulse 86   Temp 98 F (36.7 C) (Oral)   Resp 19   Ht 6' (1.829 m)   Wt 150 lb (68 kg)   SpO2 100%   BMI 20.34 kg/m  Filed Weights   05/13/23 1702  Weight: 150 lb (68 kg)    Estimated body mass index is 20.34 kg/m as calculated from the following:   Height as of this encounter: 6' (1.829 m).   Weight as of this encounter: 150 lb (68 kg).  LABS: CBC:    Component Value Date/Time   WBC 10.8 (H) 05/14/2023 0810   HGB 6.5 (L) 05/14/2023 0810   HGB 12.9 (L) 08/14/2013 2304   HCT 20.7 (L) 05/14/2023 0810   HCT 39.4 (L) 08/14/2013 2304   PLT 240 05/14/2023 0810   PLT 180 08/14/2013 2304   MCV 85.9 05/14/2023 0810   MCV 91 08/14/2013 2304   NEUTROABS 8.2 (H) 05/13/2023 1824   NEUTROABS 3.2 07/28/2013 1223   LYMPHSABS 2.6 05/13/2023 1824   LYMPHSABS 1.3 07/28/2013 1223   MONOABS 1.0 05/13/2023 1824   MONOABS 0.5 07/28/2013 1223   EOSABS 0.3 05/13/2023 1824   EOSABS 0.1 07/28/2013 1223   BASOSABS 0.0 05/13/2023 1824   BASOSABS 0.0 07/28/2013 1223   Comprehensive Metabolic Panel:    Component Value Date/Time   NA 136 05/14/2023 0810   NA 137 08/14/2013 2304   K 4.9 05/14/2023 0810   K 3.8 08/14/2013 2304   CL 107 05/14/2023 0810   CL 104 08/14/2013 2304   CO2 20 (L) 05/14/2023 0810   CO2 24 08/14/2013 2304   BUN 33 (H) 05/14/2023 0810   BUN 10 08/14/2013 2304   CREATININE 2.82 (H) 05/14/2023 0810   CREATININE 1.40 (H) 08/14/2013 2304   GLUCOSE 100 (H) 05/14/2023 0810   GLUCOSE 110 (H) 08/14/2013 2304   CALCIUM 8.4 (L) 05/14/2023  0810   CALCIUM 8.2 (L) 08/14/2013 2304   AST 64 (H) 05/13/2023 1824   ALT 33 05/13/2023 1824   ALKPHOS 434 (H) 05/13/2023 1824   BILITOT 0.7 05/13/2023 1824   PROT 6.5 05/13/2023 1824   ALBUMIN 2.4 (L) 05/13/2023 1824    RADIOGRAPHIC STUDIES: CT ABDOMEN PELVIS WO CONTRAST Result Date: 05/13/2023 CLINICAL DATA:  Acute kidney injury. Loss of appetite. Weight loss. Evaluate for malignancy. EXAM: CT ABDOMEN AND PELVIS WITHOUT CONTRAST TECHNIQUE: Multidetector CT imaging of the abdomen and pelvis was performed following the standard protocol without IV contrast. RADIATION DOSE REDUCTION: This exam was performed according to the departmental dose-optimization program which includes automated exposure control, adjustment of the mA and/or kV according to patient size and/or use of iterative reconstruction technique. COMPARISON:  01/28/2018 and chest CTA 02/04/2022 FINDINGS: Lower chest: Irregular pulmonary nodule in right middle lobe measures 6 mm on image 9/5 is stable since 2023. Scarring or atelectasis in the posterior right lower lobe. New pulmonary nodules at the posterior left lower lobe on image 18/5, largest measures 6 mm. Question another new nodule in the right middle lobe region on image 4/5 that measures 3 mm. Hepatobiliary: Liver is enlarged and diffusely heterogeneous. Nodular contour of the liver. Findings are concerning for multiple hepatic lesions. Largest lesion appears to be in the posterior right hepatic lobe on image 43/3 that measures roughly 2.8 cm. Limited evaluation for biliary dilatation on this study without intravascular contrast. Gallbladder is not distended. Pancreas: Unremarkable. No pancreatic ductal dilatation or surrounding inflammatory changes.  Spleen: Normal in size without focal abnormality. Adrenals/Urinary Tract: Normal adrenal glands. High-density material in the right kidney lower pole region could represent small stones. No hydronephrosis. Calcification in the left  kidney lower pole may be in the cortex. Probable left renal cysts but poorly characterized on this exam without intravascular contrast. Mild bladder wall thickening is likely chronic. Stomach/Bowel: Normal appearance of the stomach. Normal appearance of the small bowel without dilatation. No gross abnormality in the colon. No evidence for acute bowel inflammation. Vascular/Lymphatic: New right inguinal lymphadenopathy. Right inguinal lymphadenopathy seen on image 90/3. Mildly enlarged right pelvic sidewall lymph node on image 70/3 measures 0.9 cm in the short axis. Atherosclerotic disease in the abdominal aorta without aneurysm. Reproductive: Prostate is unremarkable. Other: Evidence for previous right inguinal hernia repair. Small new nodular densities with mild stranding in the left paracolic region on image 42/3 and image 44/3. Largest peritoneal nodule measures 1.0 cm. Findings are concerning for peritoneal disease in this area. There appears to be 2 small periumbilical hernias containing fat. Question a small peritoneal nodule in the right lower abdomen on image 62/3. Negative for free fluid. Negative for free air. Musculoskeletal: Disc space narrowing and degenerative changes in lower lumbar spine. No suspicious bone lesions. IMPRESSION: 1. Concern for neoplastic and metastatic disease. The liver is enlarged and diffusely heterogeneous. Concern for multiple liver lesions. In addition, there is new right inguinal lymphadenopathy. Concern for small peritoneal nodules that could represent peritoneal metastatic disease. In addition, there are new small pulmonary nodules. Potentially, the liver and right inguinal region could be amendable for ultrasound-guided biopsy. 2. Small ventral hernias containing fat. 3. Question nephrolithiasis without hydronephrosis. These results were called by telephone at the time of interpretation on 05/13/2023 at 8:44 pm to provider [facility] , who verbally acknowledged these  results. Electronically Signed   By: Richarda Overlie M.D.   On: 05/13/2023 20:47   DG Chest Portable 1 View Result Date: 05/13/2023 CLINICAL DATA:  poorly responsive poorly responsive and abnormal lab. Hx of HTN and former smoker. EXAM: PORTABLE CHEST 1 VIEW COMPARISON:  Chest x-ray 02/04/2022, CT chest 02/04/2022 FINDINGS: The heart and mediastinal contours are unchanged. Atherosclerotic plaque. Chronic coarsened markings. No focal consolidation. No pulmonary edema. No pleural effusion. No pneumothorax. No acute osseous abnormality. IMPRESSION: 1. No active disease. 2.  Aortic Atherosclerosis (ICD10-I70.0). Electronically Signed   By: Tish Frederickson M.D.   On: 05/13/2023 20:32   CT HEAD WO CONTRAST ( ) Result Date: 05/13/2023 CLINICAL DATA:  poor responsiveness, eval ich, cva EXAM: CT HEAD WITHOUT CONTRAST TECHNIQUE: Contiguous axial images were obtained from the base of the skull through the vertex without intravenous contrast. RADIATION DOSE REDUCTION: This exam was performed according to the departmental dose-optimization program which includes automated exposure control, adjustment of the mA and/or kV according to patient size and/or use of iterative reconstruction technique. COMPARISON:  CT head 02/04/2022. FINDINGS: Brain: No evidence of acute infarction, hemorrhage, hydrocephalus, extra-axial collection or mass lesion/mass effect. Vascular: No hyperdense vessel. Skull: No acute fracture. Sinuses/Orbits: Clear sinuses.  No acute orbital findings. Other: No mastoid effusions. IMPRESSION: No evidence of acute intracranial abnormality. Electronically Signed   By: Feliberto Harts M.D.   On: 05/13/2023 20:23    PERFORMANCE STATUS (ECOG) : 3 - Symptomatic, >50% confined to bed  Review of Systems Unable to provide  Physical Exam General: NAD Cardiovascular: regular rate and rhythm Pulmonary: clear ant fields Abdomen: soft, nontender Extremities: no edema, no joint deformities Skin: no  rashes Neurological: Weakness, confused  IMPRESSION: Patient seen in the emergency department.  Patient sent to the ER for evaluation of anemia and weight loss.  CT concerning for widespread metastasis involving the liver, peritoneum, lymph nodes, and possible lung.  Patient is confused at baseline and unable to dissipate meaningfully in a conversation regarding goals.  I called and spoke with his caregiver - Doristine Mango.  Patient has lived in [name] for the past 6 years.  At baseline, patient is minimally communicative and requires some assistance to complete ADLs.  I then called and separately spoke with both patient's brothers [name] and [place]). Ronnie lives in at [place] and rarely sees patient. [name] [phone]) lives in [place].   Both brothers verbalized understanding that workup is concerning for possible advanced stage cancer.  They are interested in pursuing biopsy for tissue diagnosis and then to discuss treatment options.  However, both also seem to understand that treatment options may be limited in the setting of patient's other advanced comorbidities.  They do not seem inclined to want to put patient through anything that would negatively impact his quality of life for comfort.  We discussed CODE STATUS.  Both seemed receptive to the idea of DNR/DNI.  However, brothers wanted to discuss together prior to making a decision to change CODE STATUS.  Patient does not have any advance directives nor does he have a HCPOA.  His brothers are his closest related family and are involved in decision making.   Symptomatically, patient appears comfortable.  He is unable to provide complete ROS but does deny pain.  PLAN: -Continue current scope of treatment -Family interested in biopsy and then to discuss treatment options -Family considering CODE STATUS  Case and plan discussed with Dr. Smith Robert  Time Total: 60 minutes  Visit consisted of counseling and education dealing  with the complex and emotionally intense issues of symptom management and palliative care in the setting of serious and potentially life-threatening illness.Greater than 50%  of this time was spent counseling and coordinating care related to the above assessment and plan.  Signed by: Laurette Schimke, PhD, NP-C

## 2023-05-14 NOTE — ED Notes (Signed)
 Pt finished Liver biopsy, NPO until 1600

## 2023-05-14 NOTE — Consult Note (Addendum)
 Chief Complaint: Liver lesion; request for liver lesion biopsy  Referring Provider(s):Dr. Smith Robert   Supervising Physician: Roanna Banning  Patient Status: [facility] - In-pt  History of Present Illness: Jeremy [name] is a 78 y.o. male wth PMH of dementia, cardiomyopathy, CHF, HTN, CKD, prostate cancer, and seizures. He presented to the ED on 3/26 for after outpatient labs revealed hgb 5.9 and creatinine 3.3 (baseline 1.5- 1.8) amongst other abnormal values. He reports weight loss, poor appetite and a new rash.   ED workup included CT abd/pelvis which demonstrated "Concern for neoplastic and metastatic disease. The liver is enlarged and diffusely heterogeneous. Concern for multiple liver lesions. In addition, there is new right inguinal lymphadenopathy. Concern for small peritoneal nodules that could represent peritoneal metastatic disease. In addition, there are new small pulmonary nodules. Potentially, the liver and right inguinal region could be amendable for ultrasound-guided biopsy."  He was admitted for AKI and metastatic neoplasm. Palliative care was consulted and after multidisciplinary discussion with family, family decided to move forward with US biopsy of liver lesion. IR consulted and Dr. Milford Cage approved case for 05/14/23. Consent is obtained today from brother Jeremy [name].    Upon exam, patient is pleasantly confused, acknowledges planned procedure and states agreeing with his brothers. He is cooperative throughout exam. In ED at time of exam, lying in bed. RN confirms NPO except sip since MN. Unknown last dose of ASA but discussed with Dr. Milford Cage and decision to proceed. Pt denies abd pain or any other pain. NKDA   Patient is Full Code  Past Medical History:  Diagnosis Date   Anemia    CAD (coronary artery disease)    Cancer (HCC) 2019   prostate /radiation   Chronic combined systolic and diastolic CHF (congestive heart failure) (HCC)    Dementia (HCC)    HLD  (hyperlipidemia)    Hypertension    Prostate cancer (HCC)    Seizures (HCC)    last seizure 2020    Past Surgical History:  Procedure Laterality Date   FLEXOR TENDON REPAIR Right 02/12/2021   Procedure: Right hand contracture release - Flexor release, pinning of PIP joints;  Surgeon: Kennedy Bucker, MD;  Location: [facility] ORS;  Service: Orthopedics;  Laterality: Right;   PROSTATE BIOPSY N/A 12/15/2017   Procedure: BIOPSY TRANSRECTAL ULTRASONIC PROSTATE (TUBP);  Surgeon: Riki Altes, MD;  Location: [facility] ORS;  Service: Urology;  Laterality: N/A;   ROBOT ASSISTED INGUINAL HERNIA REPAIR Right 09/02/2018   Procedure: ROBOT ASSISTED INGUINAL HERNIA REPAIR AND UMBILICAL HERNIA, RIGHT;  Surgeon: Leafy Ro, MD;  Location: [facility] ORS;  Service: General;  Laterality: Right;    Allergies: Patient has no known allergies.  Medications: Prior to Admission medications   Medication Sig Start Date End Date Taking? Authorizing Provider  acetaminophen (TYLENOL) 325 MG tablet Take 650 mg by mouth every 4 (four) hours as needed for moderate pain, fever or headache.   Yes [provider]  alum & mag hydroxide-simeth (MAALOX/MYLANTA) 200-200-20 MG/5ML suspension Take 30 mLs by mouth daily as needed for indigestion or heartburn.   Yes [provider]  aspirin EC 81 MG tablet Take 1 tablet (81 mg total) by mouth daily. Swallow whole. 02/15/22  Yes Gherghe, Daylene Katayama, MD  CALCIUM + VITAMIN D3 600-10 MG-MCG TABS TAKE 1 TABLET BY MOUTH 2 TIMES DAILY 01/14/21  Yes Stoioff, Verna Czech, MD  carvedilol (COREG) 6.25 MG tablet Take 1 tablet (6.25 mg total) by mouth 2 (two) times daily with  a meal. 02/14/22  Yes Gherghe, Daylene Katayama, MD  cyanocobalamin (,VITAMIN B-12,) 1000 MCG/ML injection Inject 1,000 mcg into the muscle every 30 (thirty) days.   Yes [provider]  ferrous sulfate 325 (65 FE) MG EC tablet Take 1 tablet (325 mg total) by mouth 2 (two) times daily. 02/14/22 05/14/23 Yes Gherghe,  Daylene Katayama, MD  guaiFENesin (ROBITUSSIN) 100 MG/5ML liquid Take 200 mg by mouth every 6 (six) hours as needed for cough.   Yes [provider]  HYDROcodone-acetaminophen (NORCO) 5-325 MG tablet Take 1-2 tablets by mouth every 6 (six) hours as needed for moderate pain. 02/14/22  Yes Leatha Gilding, MD  lamoTRIgine (LAMICTAL) 25 MG tablet Take 25 mg by mouth 2 (two) times daily. 03/02/18  Yes [provider]  levETIRAcetam (KEPPRA) 500 MG tablet Take 500 mg by mouth 2 (two) times daily. 01/27/19 05/14/23 Yes [provider]  loperamide (IMODIUM) 2 MG capsule Take 2 mg by mouth 4 (four) times daily as needed for diarrhea or loose stools.   Yes [provider]  losartan (COZAAR) 25 MG tablet Take 1 tablet (25 mg total) by mouth daily. 02/15/22  Yes Gherghe, Daylene Katayama, MD  magnesium hydroxide (MILK OF MAGNESIA) 400 MG/5ML suspension Take 30 mLs by mouth daily as needed for mild constipation.   Yes [provider]  ondansetron (ZOFRAN-ODT) 4 MG disintegrating tablet Take 1 tablet (4 mg total) by mouth every 8 (eight) hours as needed for nausea or vomiting. 03/28/23  Yes Sharman Cheek, MD  risperiDONE (RISPERDAL) 2 MG tablet Take 1 mg by mouth 3 (three) times daily.   Yes [provider]  rosuvastatin (CRESTOR) 10 MG tablet Take 10 mg by mouth daily. 11/02/19 05/14/23 Yes [provider]  tamsulosin (FLOMAX) 0.4 MG CAPS capsule Take 0.4 mg by mouth daily after supper. 07/01/19  Yes [provider]     History reviewed. No pertinent family history.  Social History   Socioeconomic History   Marital status: Single    Spouse name: Not on file   Number of children: Not on file   Years of education: Not on file   Highest education level: Not on file  Occupational History   Not on file  Tobacco Use   Smoking status: Former    Types: Cigarettes   Smokeless tobacco: Never  Vaping Use   Vaping status: Never Used  Substance and Sexual  Activity   Alcohol use: Not Currently   Drug use: Not Currently    Types: Cocaine, IV    Comment: none for 3 years   Sexual activity: Not Currently  Other Topics Concern   Not on file  Social History Narrative   Lives in group home  [facility]. [phone]    Social Drivers of Corporate investment banker Strain: Low Risk  (10/31/2022)   Received from [facility]   Overall Financial Resource Strain (CARDIA)    Difficulty of Paying Living Expenses: Not hard at all  Food Insecurity: No Food Insecurity (10/31/2022)   Received from [facility]   Hunger Vital Sign    Worried About Running Out of Food in the Last Year: Never true    Ran Out of Food in the Last Year: Never true  Transportation Needs: No Transportation Needs (10/31/2022)   Received from [facility] - Transportation    In the past 12 months, has lack of transportation kept  you from medical appointments or from getting medications?: No    Lack of Transportation (Non-Medical): No  Physical Activity: Not on file  Stress: Not on file  Social Connections: Not on file     Review of Systems: A 12 point ROS discussed and pertinent positives are indicated in the HPI above.  All other systems are negative.  Review of Systems  Constitutional:  Positive for appetite change. Negative for chills and fever.  Respiratory:  Negative for shortness of breath.   Cardiovascular:  Negative for chest pain.  Gastrointestinal:  Negative for abdominal pain, nausea and vomiting.  Skin:  Positive for rash.  Hematological:  Does not bruise/bleed easily.  Psychiatric/Behavioral:  Positive for confusion.      Vital Signs: BP 138/63   Pulse 71   Temp 98 F (36.7 C) (Oral)   Resp 13   Ht 6' (1.829 m)   Wt 150 lb (68 kg)   SpO2 97%   BMI 20.34 kg/m   Advance Care Plan: No documents on file.     Physical Exam HENT:     Mouth/Throat:     Mouth: Mucous  membranes are dry.     Pharynx: Oropharynx is clear.  Cardiovascular:     Rate and Rhythm: Normal rate and regular rhythm.     Pulses: Normal pulses.     Heart sounds: Normal heart sounds.  Pulmonary:     Effort: Pulmonary effort is normal.     Breath sounds: Normal breath sounds.  Abdominal:     General: Bowel sounds are normal.     Palpations: Abdomen is soft.     Tenderness: There is no abdominal tenderness. There is no guarding.  Musculoskeletal:     Right lower leg: No edema.     Left lower leg: No edema.  Skin:    General: Skin is warm and dry.     Comments: No wounds over area of abd planned for biopsy insertion site  Neurological:     Mental Status: He is alert. Mental status is at baseline. He is disoriented.  Psychiatric:        Mood and Affect: Mood normal.        Behavior: Behavior normal.      Imaging: CT CHEST WO CONTRAST Result Date: 05/14/2023 CLINICAL DATA:  Altered mental status with abnormal renal function and anemia with weight loss. Evaluation of occult malignancy EXAM: CT CHEST WITHOUT CONTRAST TECHNIQUE: Multidetector CT imaging of the chest was performed following the standard protocol without IV contrast. RADIATION DOSE REDUCTION: This exam was performed according to the departmental dose-optimization program which includes automated exposure control, adjustment of the mA and/or kV according to patient size and/or use of iterative reconstruction technique. COMPARISON:  Chest radiograph dated 05/13/2023, CTA chest dated 02/04/2022, 01/13/2015 FINDINGS: Cardiovascular: Normal heart size. No significant pericardial fluid/thickening. Great vessels are normal in course and caliber. Coronary artery calcifications and aortic atherosclerosis. Mediastinum/Nodes: Imaged thyroid gland without nodules meeting criteria for imaging follow-up by size. Normal esophagus. No pathologically enlarged axillary, supraclavicular, mediastinal, or hilar lymph nodes. Lungs/Pleura: The  central airways are patent. Mild upper lung predominant paraseptal emphysema. New basilar left lower lobe nodules measuring 6 x 5 mm (3:89), 5 x 5 mm (3:94), and 10 x 5 mm (3:95). Additional 3 mm central left lower lobe nodule (3:87). Unchanged irregular, spiculated 7 x 6 mm right middle lobe nodule (3:84) dating back to 2016, likely benign. Bilateral lower lobe linear atelectasis/scarring. No pneumothorax. Trace bilateral pleural  effusions. Upper abdomen: Mildly nodular hepatic contour. Heterogeneous appearance of the hepatic parenchyma. Please see separately dictated CT abdomen and pelvis report for detailed findings. Musculoskeletal: No acute or abnormal lytic or blastic osseous lesions. Multilevel degenerative changes of the thoracic spine. IMPRESSION: 1. New basilar left lower lobe nodules, indeterminate. 2. Trace bilateral pleural effusions. 3. Mildly nodular hepatic contour. Heterogeneous appearance of the hepatic parenchyma. Please see separately dictated CT abdomen and pelvis report for detailed findings. 4. Aortic Atherosclerosis (ICD10-I70.0) and Emphysema (ICD10-J43.9). Coronary artery calcifications. Assessment for potential risk factor modification, dietary therapy or pharmacologic therapy may be warranted, if clinically indicated. Electronically Signed   By: Agustin Cree M.D.   On: 05/14/2023 11:57   CT ABDOMEN PELVIS WO CONTRAST Result Date: 05/13/2023 CLINICAL DATA:  Acute kidney injury. Loss of appetite. Weight loss. Evaluate for malignancy. EXAM: CT ABDOMEN AND PELVIS WITHOUT CONTRAST TECHNIQUE: Multidetector CT imaging of the abdomen and pelvis was performed following the standard protocol without IV contrast. RADIATION DOSE REDUCTION: This exam was performed according to the departmental dose-optimization program which includes automated exposure control, adjustment of the mA and/or kV according to patient size and/or use of iterative reconstruction technique. COMPARISON:  01/28/2018 and chest  CTA 02/04/2022 FINDINGS: Lower chest: Irregular pulmonary nodule in right middle lobe measures 6 mm on image 9/5 is stable since 2023. Scarring or atelectasis in the posterior right lower lobe. New pulmonary nodules at the posterior left lower lobe on image 18/5, largest measures 6 mm. Question another new nodule in the right middle lobe region on image 4/5 that measures 3 mm. Hepatobiliary: Liver is enlarged and diffusely heterogeneous. Nodular contour of the liver. Findings are concerning for multiple hepatic lesions. Largest lesion appears to be in the posterior right hepatic lobe on image 43/3 that measures roughly 2.8 cm. Limited evaluation for biliary dilatation on this study without intravascular contrast. Gallbladder is not distended. Pancreas: Unremarkable. No pancreatic ductal dilatation or surrounding inflammatory changes. Spleen: Normal in size without focal abnormality. Adrenals/Urinary Tract: Normal adrenal glands. High-density material in the right kidney lower pole region could represent small stones. No hydronephrosis. Calcification in the left kidney lower pole may be in the cortex. Probable left renal cysts but poorly characterized on this exam without intravascular contrast. Mild bladder wall thickening is likely chronic. Stomach/Bowel: Normal appearance of the stomach. Normal appearance of the small bowel without dilatation. No gross abnormality in the colon. No evidence for acute bowel inflammation. Vascular/Lymphatic: New right inguinal lymphadenopathy. Right inguinal lymphadenopathy seen on image 90/3. Mildly enlarged right pelvic sidewall lymph node on image 70/3 measures 0.9 cm in the short axis. Atherosclerotic disease in the abdominal aorta without aneurysm. Reproductive: Prostate is unremarkable. Other: Evidence for previous right inguinal hernia repair. Small new nodular densities with mild stranding in the left paracolic region on image 42/3 and image 44/3. Largest peritoneal nodule  measures 1.0 cm. Findings are concerning for peritoneal disease in this area. There appears to be 2 small periumbilical hernias containing fat. Question a small peritoneal nodule in the right lower abdomen on image 62/3. Negative for free fluid. Negative for free air. Musculoskeletal: Disc space narrowing and degenerative changes in lower lumbar spine. No suspicious bone lesions. IMPRESSION: 1. Concern for neoplastic and metastatic disease. The liver is enlarged and diffusely heterogeneous. Concern for multiple liver lesions. In addition, there is new right inguinal lymphadenopathy. Concern for small peritoneal nodules that could represent peritoneal metastatic disease. In addition, there are new small pulmonary nodules. Potentially, the liver  and right inguinal region could be amendable for ultrasound-guided biopsy. 2. Small ventral hernias containing fat. 3. Question nephrolithiasis without hydronephrosis. These results were called by telephone at the time of interpretation on 05/13/2023 at 8:44 pm to provider [facility] , who verbally acknowledged these results. Electronically Signed   By: Richarda Overlie M.D.   On: 05/13/2023 20:47   DG Chest Portable 1 View Result Date: 05/13/2023 CLINICAL DATA:  poorly responsive poorly responsive and abnormal lab. Hx of HTN and former smoker. EXAM: PORTABLE CHEST 1 VIEW COMPARISON:  Chest x-ray 02/04/2022, CT chest 02/04/2022 FINDINGS: The heart and mediastinal contours are unchanged. Atherosclerotic plaque. Chronic coarsened markings. No focal consolidation. No pulmonary edema. No pleural effusion. No pneumothorax. No acute osseous abnormality. IMPRESSION: 1. No active disease. 2.  Aortic Atherosclerosis (ICD10-I70.0). Electronically Signed   By: Tish Frederickson M.D.   On: 05/13/2023 20:32   CT HEAD WO CONTRAST ( ) Result Date: 05/13/2023 CLINICAL DATA:  poor responsiveness, eval ich, cva EXAM: CT HEAD WITHOUT CONTRAST TECHNIQUE: Contiguous axial images were obtained from  the base of the skull through the vertex without intravenous contrast. RADIATION DOSE REDUCTION: This exam was performed according to the departmental dose-optimization program which includes automated exposure control, adjustment of the mA and/or kV according to patient size and/or use of iterative reconstruction technique. COMPARISON:  CT head 02/04/2022. FINDINGS: Brain: No evidence of acute infarction, hemorrhage, hydrocephalus, extra-axial collection or mass lesion/mass effect. Vascular: No hyperdense vessel. Skull: No acute fracture. Sinuses/Orbits: Clear sinuses.  No acute orbital findings. Other: No mastoid effusions. IMPRESSION: No evidence of acute intracranial abnormality. Electronically Signed   By: Feliberto Harts M.D.   On: 05/13/2023 20:23    Labs:  CBC: Recent Labs    03/28/23 1230 05/13/23 1824 05/14/23 0810  WBC 15.2* 12.3* 10.8*  HGB 8.5* 7.7* 6.5*  HCT 26.4* 24.3* 20.7*  PLT 356 245 240    COAGS: Recent Labs    05/13/23 2222  INR 1.3*  APTT 26    BMP: Recent Labs    03/28/23 1230 05/13/23 1824 05/14/23 0810  NA 138 137 136  K 4.0 4.6 4.9  CL 108 107 107  CO2 20* 19* 20*  GLUCOSE 154* 120* 100*  BUN 19 35* 33*  CALCIUM 8.6* 8.8* 8.4*  CREATININE 1.81* 3.24* 2.82*  GFRNONAA 38* 19* 22*    LIVER FUNCTION TESTS: Recent Labs    05/13/23 1824  BILITOT 0.7  AST 64*  ALT 33  ALKPHOS 434*  PROT 6.5  ALBUMIN 2.4*    TUMOR MARKERS: No results for input(s): "AFPTM", "CEA", "CA199", "CHROMGRNA" in the last 8760 hours.  Assessment and Plan:  Request for image guided liver lesion biopsy - options discussed with family who would like to proceed with biopsy - Case approved for 3/27 by Dr. Milford Cage - labs reviewed and acceptable for procedure per Dr. Milford Cage, needs transfusion today but not necessarily prior to procedure. Transfusion orders managed my IP team.  - unknown last dose ASA, ok to proceed per Dr. Milford Cage - NPO since MN - VSS,  afebrile  Risks and benefits of liver lesion biopsy was discussed with the patient's family (Jeremy [name]) including, but not limited to bleeding, infection, damage to adjacent structures or low yield requiring additional tests.  All of the questions were answered and there is agreement to proceed.  Consent signed and in chart.   Thank you for allowing our service to participate in Jeremy [name] 's care.  Electronically Signed: Carlton Adam, NP   05/14/2023, 12:28 PM     I spent a total of 40 Minutes    in face to face in clinical consultation, greater than 50% of which was counseling/coordinating care for image guided liver lesion biopsy.   (A copy of this note was sent to the referring provider and the time of visit.)

## 2023-05-14 NOTE — Progress Notes (Addendum)
 Progress Note   Patient: Jeremy [name] BJY:[id number] DOB: 1945-08-28 DOA: 05/13/2023     1 DOS: the patient was seen and examined on 05/14/2023   Brief hospital course: 78yo with h/o prostate CA, chronic systolic CHF, HTN, HLD, CAD, dementia, seizure d/o, stage 3B CKD, and cocaine abuse who presented on 3/26 with weight loss.  Evaluation in the ER with imaging suggestive of widely metastatic cancer, uncertain primary.  Oncology consulting.    Assessment and Plan:  Metastatic cancer Patient presented with FTT from group home in the setting of known dementia CT scan showed metastasized disease to liver, peritoneum, with new right inguinal lymphadenopathy Unclear primary cancer, although he has a history of prostate cancer Oncology is consulting  Admitted to telemetry bed as inpatient Check PSA and AFP INR/PTT/type screen Palliative consult   History of prostate cancer Diagnosed in 2019, completed all treatment by 2022 S/p of radiation therapy and prostatectomy PSA has remained undetectable through 07/2022 Continue Flomax Follow-up PSA   Acute kidney injury superimposed on stage 3b chronic kidney disease  Likely due to dehydration and continuation of losartan Baseline creatinine 1.81/GFR 38; 3.24/19 on presentation and improved to 2.82/22 today No hydronephrosis per CT scan Hold Cozaar Avoid using renal toxic medications   Normocytic anemia Hemoglobin 7.7 on presentation (8.5 on 03/28/2023); dropped to 6.5 today No rectal bleeding or dark stool Anemia panel with low iron and folate, elevated ferritin Brother provided consent for blood, will transfuse 2 units PRBC Follow-up CBC in AM   CAD (coronary artery disease) Hold aspirin due to worsening anemia Continue rosuvastatin   Chronic combined systolic and diastolic CHF (congestive heart failure Echo on 02/05/2022 showed EF of 40-45% with grade 1 diastolic dysfunction CHF seems to be compensated Watch volume status  closely   Dementia without behavioral disturbance Fall precautions Delirium precautions  DNR I discussed code status with the brother and the patient would not desire resuscitation and would prefer to die a natural death should that situation arise Code status changed Patient will need a gold out of facility DNR form at the time of discharge       Consultants: Oncology IR  Procedures: IR biopsy  Antibiotics: None  30 Day Unplanned Readmission Risk Score    Flowsheet Row ED to Hosp-Admission (Current) from 05/13/2023 in [facility] Emergency Department at [facility]  30 Day Unplanned Readmission Risk Score (%) 38.6 Filed at 05/14/2023 0801       This score is the patient's risk of an unplanned readmission within 30 days of being discharged (0 -100%). The score is based on dignosis, age, lab data, medications, orders, and past utilization.   Low:  0-14.9   Medium: 15-21.9   High: 22-29.9   Extreme: 30 and above           Subjective: Patient is somnolent, not particularly interactive.  I spoke with his brother, who lives in [place].  He has had dementia for several years, mobility has slowed.  Prostate CA was treated.  Speech is slower.  Independent with ADLs.  He would not want resuscitation.     Physical Exam: Vitals:   05/14/23 0547 05/14/23 0630 05/14/23 0730 05/14/23 0800  BP: 139/66 138/63 133/64 (!) 130/58  Pulse: 79 80 78 86  Resp: 14 16 16 19   Temp: 97.7 F (36.5 C)     TempSrc:      SpO2: 100% 100% 100% 100%  Weight:      Height:  No intake or output data in the 24 hours ending 05/14/23 0921 Filed Weights   05/13/23 1702  Weight: 68 kg    Exam:  General:  Appears frail, chronically ill Eyes:   normal lids, iris ENT:  grossly normal hearing, lips & tongue, mmm; poor/absent dentition Cardiovascular:  RRR, no m/r/g. No LE edema.  Respiratory:   CTA bilaterally with no wheezes/rales/rhonchi.  Normal respiratory effort. Abdomen:   soft, NT, distention noted Skin:  no rash or induration seen on limited exam Musculoskeletal:  grossly normal tone BUE/BLE, good ROM, no bony abnormality Psychiatric:  blunted mood and affect, speech sparse, AOx1-2 Neurologic:  unable to effectively perform, strength appears symmetric  Data Reviewed: I have reviewed the patient's lab results since admission.  Pertinent labs for today include:   CO2 20, stable BUN 33/Creatinine 2.82/GFR 22, improved from 35/3.24/19; 19/1.81/38 on 2/8 WBC 10.8 Hgb 6.5, down from 7.7, 8.5 on 2/8    Family Communication: None present; I spoke with his brother by telephone  Disposition: Status is: Inpatient Remains inpatient appropriate because: ongoing management  Planned Discharge Destination:  TBD    Time spent: 50 minutes  Author: Jonah Blue, MD 05/14/2023 9:18 AM  For on call review [email or web address].

## 2023-05-14 NOTE — Procedures (Signed)
 Vascular and Interventional Radiology Procedure Note  Patient: LESSLIE MCKEEHAN DOB: 05/14/1945 Medical Record Number: [id number] Note Date/Time: 05/14/23 3:01 PM   Performing Physician: Roanna Banning, MD Assistant(s): None  Diagnosis: Liver masses. No DX   Procedure: LIVER MASS BIOPSY  Anesthesia: Conscious Sedation Complications: None Estimated Blood Loss: Minimal Specimens: Sent for Pathology  Findings:  Successful Ultrasound-guided biopsy of liver mass. A total of 3 samples were obtained. Hemostasis of the tract was achieved using Manual Pressure.  Plan: Bed rest for 3 hours.  See detailed procedure note with images in PACS. The patient tolerated the procedure well without incident or complication and was returned to Recovery in stable condition.    Roanna Banning, MD [facility]   Pager. [phone] Clinic. [phone]

## 2023-05-14 NOTE — Consult Note (Incomplete)
 Hematology/Oncology Consult note [facility] [phone] [phone]  Patient Care Team: Marguarite Arbour, MD as PCP - General (Internal Medicine) Carmina Miller, MD as Radiation Oncologist (Radiation Oncology)   Name of the patient: Jeremy [name]  [id number]  [birth year]    Reason for referral- ***   Referring physician- ***  Date of visit: @TODAY @   History of presenting illness- ***  ECOG PS- ***  Pain scale- ***   Review of systems- ROS  No Known Allergies  Patient Active Problem List   Diagnosis Date Noted   Palliative care encounter 05/14/2023   Acute kidney injury superimposed on stage 3b chronic kidney disease (HCC) 05/13/2023   Metastasized cancer 05/13/2023   CAD (coronary artery disease) 05/13/2023   Dementia without behavioral disturbance (HCC) 05/13/2023   Leukocytosis 05/13/2023   Normocytic anemia    Chronic combined systolic and diastolic CHF (congestive heart failure) (HCC)    Acute encephalopathy 02/04/2022   Rhabdomyolysis 02/04/2022   Ventricular bigeminy 02/04/2022   NSTEMI (non-ST elevated myocardial infarction) (HCC) 02/04/2022   Stage 3b chronic kidney disease (CKD) (HCC) 02/04/2022   Acute confusion due to infection 09/16/2020   AKI (acute kidney injury) (HCC) 09/16/2020   COVID-19 virus infection 09/15/2020   Prostate cancer (HCC) 06/11/2018   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   Hypertension 11/02/2015   Seizures (HCC) 11/02/2015   Incarcerated hernia    Crack cocaine use 12/19/2013   Chronic tension-type headache, not intractable 08/29/2013   Memory loss 08/29/2013   Numbness 08/29/2013     Past Medical History:  Diagnosis Date   Anemia    CAD (coronary artery disease)    Cancer (HCC) 2019   prostate /radiation   Chronic combined systolic and diastolic CHF (congestive heart failure) (HCC)    Dementia (HCC)    HLD (hyperlipidemia)    Hypertension    Prostate cancer  (HCC)    Seizures (HCC)    last seizure 2020     Past Surgical History:  Procedure Laterality Date   FLEXOR TENDON REPAIR Right 02/12/2021   Procedure: Right hand contracture release - Flexor release, pinning of PIP joints;  Surgeon: Kennedy Bucker, MD;  Location: [facility] ORS;  Service: Orthopedics;  Laterality: Right;   PROSTATE BIOPSY N/A 12/15/2017   Procedure: BIOPSY TRANSRECTAL ULTRASONIC PROSTATE (TUBP);  Surgeon: Riki Altes, MD;  Location: [facility] ORS;  Service: Urology;  Laterality: N/A;   ROBOT ASSISTED INGUINAL HERNIA REPAIR Right 09/02/2018   Procedure: ROBOT ASSISTED INGUINAL HERNIA REPAIR AND UMBILICAL HERNIA, RIGHT;  Surgeon: Leafy Ro, MD;  Location: [facility] ORS;  Service: General;  Laterality: Right;    Social History   Socioeconomic History   Marital status: Single    Spouse name: Not on file   Number of children: Not on file   Years of education: Not on file   Highest education level: Not on file  Occupational History   Not on file  Tobacco Use   Smoking status: Former    Types: Cigarettes   Smokeless tobacco: Never  Vaping Use   Vaping status: Never Used  Substance and Sexual Activity   Alcohol use: Not Currently   Drug use: Not Currently    Types: Cocaine, IV    Comment: none for 3 years   Sexual activity: Not Currently  Other Topics Concern   Not on file  Social History Narrative   Lives in group home  [facility]. [phone]  Social Drivers of Corporate investment banker Strain: Low Risk  (10/31/2022)   Received from [facility]   Overall Financial Resource Strain (CARDIA)    Difficulty of Paying Living Expenses: Not hard at all  Food Insecurity: Patient Unable To Answer (05/14/2023)   Hunger Vital Sign    Worried About Running Out of Food in the Last Year: Patient unable to answer    Ran Out of Food in the Last Year: Patient unable to answer  Transportation Needs: Patient Unable To Answer (05/14/2023)    PRAPARE - Transportation    Lack of Transportation (Medical): Patient unable to answer    Lack of Transportation (Non-Medical): Patient unable to answer  Physical Activity: Not on file  Stress: Not on file  Social Connections: Patient Unable To Answer (05/14/2023)   Social Connection and Isolation Panel [NHANES]    Frequency of Communication with Friends and Family: Patient unable to answer    Frequency of Social Gatherings with Friends and Family: Patient unable to answer    Attends Religious Services: Patient unable to answer    Active Member of Clubs or Organizations: Patient unable to answer    Attends Banker Meetings: Patient unable to answer    Marital Status: Patient unable to answer  Intimate Partner Violence: Patient Unable To Answer (05/14/2023)   Humiliation, Afraid, Rape, and Kick questionnaire    Fear of Current or Ex-Partner: Patient unable to answer    Emotionally Abused: Patient unable to answer    Physically Abused: Patient unable to answer    Sexually Abused: Patient unable to answer     History reviewed. No pertinent family history.   Current Facility-Administered Medications:    0.9 %  sodium chloride infusion (Manually program via Guardrails IV Fluids), , Intravenous, Once, Jonah Blue, MD   0.9 %  sodium chloride infusion, , Intravenous, Continuous, Lorretta Harp, MD, Last Rate: 75 mL/hr at 05/14/23 0807, Restarted at 05/14/23 1610   acetaminophen (TYLENOL) tablet 650 mg, 650 mg, Oral, Q6H PRN, Lorretta Harp, MD   carvedilol (COREG) tablet 6.25 mg, 6.25 mg, Oral, BID WC, Lorretta Harp, MD, 6.25 mg at 05/14/23 0801   ferrous sulfate tablet 325 mg, 325 mg, Oral, BID WC, Lorretta Harp, MD, 325 mg at 05/14/23 0801   folic acid (FOLVITE) tablet 1 mg, 1 mg, Oral, Daily, Creig Hines, MD, 1 mg at 05/14/23 1106   hydrALAZINE (APRESOLINE) injection 5 mg, 5 mg, Intravenous, Q2H PRN, Lorretta Harp, MD   HYDROcodone-acetaminophen (NORCO/VICODIN) 5-325 MG per tablet 1-2  tablet, 1-2 tablet, Oral, Q6H PRN, Lorretta Harp, MD   lamoTRIgine (LAMICTAL) tablet 25 mg, 25 mg, Oral, BID, Lorretta Harp, MD, 25 mg at 05/14/23 1031   levETIRAcetam (KEPPRA) tablet 500 mg, 500 mg, Oral, BID, Lorretta Harp, MD, 500 mg at 05/14/23 1031   loperamide (IMODIUM) capsule 2 mg, 2 mg, Oral, QID PRN, Lorretta Harp, MD   LORazepam (ATIVAN) injection 2 mg, 2 mg, Intravenous, Q2H PRN, Lorretta Harp, MD   ondansetron [facility]) injection 4 mg, 4 mg, Intravenous, Q8H PRN, Lorretta Harp, MD   risperiDONE (RISPERDAL) tablet 1 mg, 1 mg, Oral, TID, Lorretta Harp, MD, 1 mg at 05/14/23 1031   rosuvastatin (CRESTOR) tablet 10 mg, 10 mg, Oral, Daily, Lorretta Harp, MD, 10 mg at 05/14/23 1031   tamsulosin (FLOMAX) capsule 0.4 mg, 0.4 mg, Oral, QPC supper, Lorretta Harp, MD   Physical exam:  Vitals:   05/14/23 1447 05/14/23 1451 05/14/23 1500 05/14/23 1643  BP: (!) 128/56 (!) 124/58 (!) 129/57 128/61  Pulse: 71 69 68 72  Resp: 13 13 13 13   Temp:  98.4 F (36.9 C)  (!) 97.5 F (36.4 C)  TempSrc:    Oral  SpO2: 94% 95% 96% 98%  Weight:      Height:       Physical Exam        Latest Ref Rng & Units 05/14/2023    8:10 AM  CMP  Glucose 70 - 99 mg/dL 161   BUN 8 - 23 mg/dL 33   Creatinine 0.96 - 1.24 mg/dL 0.45   Sodium 409 - 811 mmol/L 136   Potassium 3.5 - 5.1 mmol/L 4.9   Chloride 98 - 111 mmol/L 107   CO2 22 - 32 mmol/L 20   Calcium 8.9 - 10.3 mg/dL 8.4       Latest Ref Rng & Units 05/14/2023    8:10 AM  CBC  WBC 4.0 - 10.5 K/uL 10.8   Hemoglobin 13.0 - 17.0 g/dL 6.5   Hematocrit 91.4 - 52.0 % 20.7   Platelets 150 - 400 K/uL 240     @IMAGES @  US BIOPSY (LIVER) Result Date: 05/14/2023 INDICATION: [id number] Liver masses [id number] EXAM: ULTRASOUND GUIDED LIVER MASS BIOPSY COMPARISON:  CT AP, 05/13/2023. MEDICATIONS: None ANESTHESIA/SEDATION: Local anesthetic and single agent sedation was employed during this procedure. A total of fentanyl 25 mcg was administered intravenously. The patient's level of  consciousness and vital signs were monitored continuously by radiology nursing throughout the procedure under my direct supervision. COMPLICATIONS: None immediate. PROCEDURE: Informed written consent was obtained from the patient and/or patient's representative after a discussion of the risks, benefits and alternatives to treatment. The patient understands and consents the procedure. A timeout was performed prior to the initiation of the procedure. Ultrasound scanning was performed of the right upper abdominal quadrant demonstrates innumerable, multifocal liver masses The RIGHT hepatic lobe was selected for biopsy and the procedure was planned. The right upper abdominal quadrant was prepped and draped in the usual sterile fashion. The overlying soft tissues were anesthetized with 1% lidocaine. A 17 gauge co-axial needle was advanced into a peripheral aspect of the lesion. This was followed by 4 core biopsies with an 18 gauge core device under direct ultrasound guidance. The needle was removed, then superficial hemostasis was obtained with manual compression. Post procedural scanning was negative for definitive area of hemorrhage or additional complication. A dressing was placed. The patient tolerated the procedure well without immediate post procedural complication. IMPRESSION: 1. Innumerable, multifocal lesions replacing liver. 2. Successful ultrasound-guided core needle biopsy of a liver mass. Roanna Banning, MD [facility] Electronically Signed   By: Roanna Banning M.D.   On: 05/14/2023 17:05   CT CHEST WO CONTRAST Result Date: 05/14/2023 CLINICAL DATA:  Altered mental status with abnormal renal function and anemia with weight loss. Evaluation of occult malignancy EXAM: CT CHEST WITHOUT CONTRAST TECHNIQUE: Multidetector CT imaging of the chest was performed following the standard protocol without IV contrast. RADIATION DOSE REDUCTION: This exam was  performed according to the departmental dose-optimization program which includes automated exposure control, adjustment of the mA and/or kV according to patient size and/or use of iterative reconstruction technique. COMPARISON:  Chest radiograph dated 05/13/2023, CTA chest dated 02/04/2022, 01/13/2015 FINDINGS: Cardiovascular: Normal heart size. No significant pericardial fluid/thickening. Great vessels are normal in course and caliber. Coronary artery calcifications and aortic atherosclerosis. Mediastinum/Nodes: Imaged thyroid gland without nodules meeting criteria  for imaging follow-up by size. Normal esophagus. No pathologically enlarged axillary, supraclavicular, mediastinal, or hilar lymph nodes. Lungs/Pleura: The central airways are patent. Mild upper lung predominant paraseptal emphysema. New basilar left lower lobe nodules measuring 6 x 5 mm (3:89), 5 x 5 mm (3:94), and 10 x 5 mm (3:95). Additional 3 mm central left lower lobe nodule (3:87). Unchanged irregular, spiculated 7 x 6 mm right middle lobe nodule (3:84) dating back to 2016, likely benign. Bilateral lower lobe linear atelectasis/scarring. No pneumothorax. Trace bilateral pleural effusions. Upper abdomen: Mildly nodular hepatic contour. Heterogeneous appearance of the hepatic parenchyma. Please see separately dictated CT abdomen and pelvis report for detailed findings. Musculoskeletal: No acute or abnormal lytic or blastic osseous lesions. Multilevel degenerative changes of the thoracic spine. IMPRESSION: 1. New basilar left lower lobe nodules, indeterminate. 2. Trace bilateral pleural effusions. 3. Mildly nodular hepatic contour. Heterogeneous appearance of the hepatic parenchyma. Please see separately dictated CT abdomen and pelvis report for detailed findings. 4. Aortic Atherosclerosis (ICD10-I70.0) and Emphysema (ICD10-J43.9). Coronary artery calcifications. Assessment for potential risk factor modification, dietary therapy or pharmacologic  therapy may be warranted, if clinically indicated. Electronically Signed   By: Agustin Cree M.D.   On: 05/14/2023 11:57   CT ABDOMEN PELVIS WO CONTRAST Result Date: 05/13/2023 CLINICAL DATA:  Acute kidney injury. Loss of appetite. Weight loss. Evaluate for malignancy. EXAM: CT ABDOMEN AND PELVIS WITHOUT CONTRAST TECHNIQUE: Multidetector CT imaging of the abdomen and pelvis was performed following the standard protocol without IV contrast. RADIATION DOSE REDUCTION: This exam was performed according to the departmental dose-optimization program which includes automated exposure control, adjustment of the mA and/or kV according to patient size and/or use of iterative reconstruction technique. COMPARISON:  01/28/2018 and chest CTA 02/04/2022 FINDINGS: Lower chest: Irregular pulmonary nodule in right middle lobe measures 6 mm on image 9/5 is stable since 2023. Scarring or atelectasis in the posterior right lower lobe. New pulmonary nodules at the posterior left lower lobe on image 18/5, largest measures 6 mm. Question another new nodule in the right middle lobe region on image 4/5 that measures 3 mm. Hepatobiliary: Liver is enlarged and diffusely heterogeneous. Nodular contour of the liver. Findings are concerning for multiple hepatic lesions. Largest lesion appears to be in the posterior right hepatic lobe on image 43/3 that measures roughly 2.8 cm. Limited evaluation for biliary dilatation on this study without intravascular contrast. Gallbladder is not distended. Pancreas: Unremarkable. No pancreatic ductal dilatation or surrounding inflammatory changes. Spleen: Normal in size without focal abnormality. Adrenals/Urinary Tract: Normal adrenal glands. High-density material in the right kidney lower pole region could represent small stones. No hydronephrosis. Calcification in the left kidney lower pole may be in the cortex. Probable left renal cysts but poorly characterized on this exam without intravascular contrast.  Mild bladder wall thickening is likely chronic. Stomach/Bowel: Normal appearance of the stomach. Normal appearance of the small bowel without dilatation. No gross abnormality in the colon. No evidence for acute bowel inflammation. Vascular/Lymphatic: New right inguinal lymphadenopathy. Right inguinal lymphadenopathy seen on image 90/3. Mildly enlarged right pelvic sidewall lymph node on image 70/3 measures 0.9 cm in the short axis. Atherosclerotic disease in the abdominal aorta without aneurysm. Reproductive: Prostate is unremarkable. Other: Evidence for previous right inguinal hernia repair. Small new nodular densities with mild stranding in the left paracolic region on image 42/3 and image 44/3. Largest peritoneal nodule measures 1.0 cm. Findings are concerning for peritoneal disease in this area. There appears to be 2 small periumbilical  hernias containing fat. Question a small peritoneal nodule in the right lower abdomen on image 62/3. Negative for free fluid. Negative for free air. Musculoskeletal: Disc space narrowing and degenerative changes in lower lumbar spine. No suspicious bone lesions. IMPRESSION: 1. Concern for neoplastic and metastatic disease. The liver is enlarged and diffusely heterogeneous. Concern for multiple liver lesions. In addition, there is new right inguinal lymphadenopathy. Concern for small peritoneal nodules that could represent peritoneal metastatic disease. In addition, there are new small pulmonary nodules. Potentially, the liver and right inguinal region could be amendable for ultrasound-guided biopsy. 2. Small ventral hernias containing fat. 3. Question nephrolithiasis without hydronephrosis. These results were called by telephone at the time of interpretation on 05/13/2023 at 8:44 pm to provider [facility] , who verbally acknowledged these results. Electronically Signed   By: Richarda Overlie M.D.   On: 05/13/2023 20:47   DG Chest Portable 1 View Result Date: 05/13/2023 CLINICAL  DATA:  poorly responsive poorly responsive and abnormal lab. Hx of HTN and former smoker. EXAM: PORTABLE CHEST 1 VIEW COMPARISON:  Chest x-ray 02/04/2022, CT chest 02/04/2022 FINDINGS: The heart and mediastinal contours are unchanged. Atherosclerotic plaque. Chronic coarsened markings. No focal consolidation. No pulmonary edema. No pleural effusion. No pneumothorax. No acute osseous abnormality. IMPRESSION: 1. No active disease. 2.  Aortic Atherosclerosis (ICD10-I70.0). Electronically Signed   By: Tish Frederickson M.D.   On: 05/13/2023 20:32   CT HEAD WO CONTRAST ( ) Result Date: 05/13/2023 CLINICAL DATA:  poor responsiveness, eval ich, cva EXAM: CT HEAD WITHOUT CONTRAST TECHNIQUE: Contiguous axial images were obtained from the base of the skull through the vertex without intravenous contrast. RADIATION DOSE REDUCTION: This exam was performed according to the departmental dose-optimization program which includes automated exposure control, adjustment of the mA and/or kV according to patient size and/or use of iterative reconstruction technique. COMPARISON:  CT head 02/04/2022. FINDINGS: Brain: No evidence of acute infarction, hemorrhage, hydrocephalus, extra-axial collection or mass lesion/mass effect. Vascular: No hyperdense vessel. Skull: No acute fracture. Sinuses/Orbits: Clear sinuses.  No acute orbital findings. Other: No mastoid effusions. IMPRESSION: No evidence of acute intracranial abnormality. Electronically Signed   By: Feliberto Harts M.D.   On: 05/13/2023 20:23    Assessment and plan- Patient is a 78 y.o. male ***   Thank you for this kind referral and the opportunity to participate in the care of this  Patient   Visit Diagnosis 1. AKI (acute kidney injury) (HCC)   2. Metastatic malignant neoplasm, unspecified site (HCC)   3. Folate deficiency     Dr. Owens Shark, MD, MPH [facility] [phone] 05/14/2023

## 2023-05-14 NOTE — ED Notes (Signed)
 Pts brief changed, peri care provided, new linens and brief applied. Bed alarm turned on.

## 2023-05-15 ENCOUNTER — Inpatient Hospital Stay

## 2023-05-15 DIAGNOSIS — C801 Malignant (primary) neoplasm, unspecified: Secondary | ICD-10-CM | POA: Diagnosis not present

## 2023-05-15 DIAGNOSIS — R32 Unspecified urinary incontinence: Secondary | ICD-10-CM | POA: Diagnosis not present

## 2023-05-15 LAB — CBC WITH DIFFERENTIAL/PLATELET
Abs Immature Granulocytes: 0.12 10*3/uL — ABNORMAL HIGH (ref 0.00–0.07)
Basophils Absolute: 0 10*3/uL (ref 0.0–0.1)
Basophils Relative: 0 %
Eosinophils Absolute: 0.2 10*3/uL (ref 0.0–0.5)
Eosinophils Relative: 2 %
HCT: 25.8 % — ABNORMAL LOW (ref 39.0–52.0)
Hemoglobin: 8.7 g/dL — ABNORMAL LOW (ref 13.0–17.0)
Immature Granulocytes: 1 %
Lymphocytes Relative: 15 %
Lymphs Abs: 1.4 10*3/uL (ref 0.7–4.0)
MCH: 28.3 pg (ref 26.0–34.0)
MCHC: 33.7 g/dL (ref 30.0–36.0)
MCV: 84 fL (ref 80.0–100.0)
Monocytes Absolute: 0.7 10*3/uL (ref 0.1–1.0)
Monocytes Relative: 8 %
Neutro Abs: 6.6 10*3/uL (ref 1.7–7.7)
Neutrophils Relative %: 74 %
Platelets: 207 10*3/uL (ref 150–400)
RBC: 3.07 MIL/uL — ABNORMAL LOW (ref 4.22–5.81)
RDW: 17.3 % — ABNORMAL HIGH (ref 11.5–15.5)
WBC: 8.9 10*3/uL (ref 4.0–10.5)
nRBC: 0 % (ref 0.0–0.2)

## 2023-05-15 LAB — COMPREHENSIVE METABOLIC PANEL WITH GFR
ALT: 26 U/L (ref 0–44)
AST: 39 U/L (ref 15–41)
Albumin: 2 g/dL — ABNORMAL LOW (ref 3.5–5.0)
Alkaline Phosphatase: 357 U/L — ABNORMAL HIGH (ref 38–126)
Anion gap: 10 (ref 5–15)
BUN: 30 mg/dL — ABNORMAL HIGH (ref 8–23)
CO2: 20 mmol/L — ABNORMAL LOW (ref 22–32)
Calcium: 8.3 mg/dL — ABNORMAL LOW (ref 8.9–10.3)
Chloride: 109 mmol/L (ref 98–111)
Creatinine, Ser: 2.16 mg/dL — ABNORMAL HIGH (ref 0.61–1.24)
GFR, Estimated: 31 mL/min — ABNORMAL LOW (ref >60)
Glucose, Bld: 81 mg/dL (ref 70–99)
Potassium: 4.2 mmol/L (ref 3.5–5.1)
Sodium: 139 mmol/L (ref 135–145)
Total Bilirubin: 1.5 mg/dL — ABNORMAL HIGH (ref 0.0–1.2)
Total Protein: 5.8 g/dL — ABNORMAL LOW (ref 6.5–8.1)

## 2023-05-15 LAB — PSA: Prostatic Specific Antigen: 0.24 ng/mL (ref 0.00–4.00)

## 2023-05-15 LAB — AFP TUMOR MARKER: AFP, Serum, Tumor Marker: 2.4 ng/mL (ref 0.0–8.4)

## 2023-05-15 MED ORDER — ENSURE ENLIVE PO LIQD
237.0000 mL | Freq: Two times a day (BID) | ORAL | Status: DC
  Administered 2023-05-16 – 2023-05-21 (×8): 237 mL via ORAL

## 2023-05-15 MED ORDER — TECHNETIUM TC 99M MEDRONATE IV KIT
20.0000 | PACK | Freq: Once | INTRAVENOUS | Status: AC | PRN
  Administered 2023-05-15: 21.54 via INTRAVENOUS

## 2023-05-15 NOTE — Progress Notes (Signed)
 Patient s/p 2 units prbc. Vital signs stable, tolerated well, patient is afebrile, alert tp verbal commands, respirations eve unlabored

## 2023-05-15 NOTE — Hospital Course (Signed)
 78yo with h/o prostate CA, chronic systolic CHF, HTN, HLD, CAD, dementia, seizure d/o, stage 3B CKD, and cocaine abuse who presented on 3/26 with weight loss. Evaluation in the ER with imaging suggestive of widely metastatic cancer, uncertain primary. Oncology consulting.

## 2023-05-15 NOTE — Progress Notes (Addendum)
 Progress Note   Patient: Jeremy [name] UJW:[id number] DOB: Aug 22, 1945 DOA: 05/13/2023     2 DOS: the patient was seen and examined on 05/15/2023   Brief hospital course: 78yo with h/o prostate CA, chronic systolic CHF, HTN, HLD, CAD, dementia, seizure d/o, stage 3B CKD, and cocaine abuse who presented on 3/26 with weight loss. Evaluation in the ER with imaging suggestive of widely metastatic cancer, uncertain primary. Oncology consulting.   Assessment and Plan:  Metastatic cancer Patient presented with FTT from group home in the setting of known dementia CT scan showed metastasized disease to liver, peritoneum, with new right inguinal lymphadenopathy Unclear primary cancer, although he has a history of prostate cancer Oncology is consulting  Admitted to telemetry bed as inpatient Check PSA and AFP INR/PTT/type screen Palliative consulting Oncology consulted, will need close outpatient f/u   History of prostate cancer Diagnosed in 2019, completed all treatment by 2022 S/p of radiation therapy and prostatectomy PSA has remained undetectable through 07/2022 Continue Flomax Follow-up PSA   Acute kidney injury superimposed on stage 3b chronic kidney disease  Likely due to dehydration and continuation of losartan Baseline creatinine 1.81/GFR 38; 3.24/19 on presentation and improved to 2.16/31 today No hydronephrosis per CT scan Hold Cozaar Avoid using renal toxic medications   Normocytic anemia Hemoglobin 7.7 on presentation (8.5 on 03/28/2023); dropped to 6.5 today No rectal bleeding or dark stool Anemia panel with low iron and folate, elevated ferritin Brother provided consent for blood, will transfuse 2 units PRBC Follow-up CBC with improvement Recheck CBC in AM   CAD (coronary artery disease) Hold aspirin due to worsening anemia Continue rosuvastatin   Chronic combined systolic and diastolic CHF (congestive heart failure Echo on 02/05/2022 showed EF of 40-45% with  grade 1 diastolic dysfunction CHF seems to be compensated Watch volume status closely   Dementia without behavioral disturbance Fall precautions Delirium precautions   DNR I discussed code status with the brother and the patient would not desire resuscitation and would prefer to die a natural death should that situation arise Code status changed Patient will need a gold out of facility DNR form at the time of discharge         Consultants: Oncology IR Palliative care   Procedures: IR liver mass biopsy 3/27   Antibiotics: None  30 Day Unplanned Readmission Risk Score    Flowsheet Row ED to Hosp-Admission (Current) from 05/13/2023 in [facility]  30 Day Unplanned Readmission Risk Score (%) 38.19 Filed at 05/15/2023 0801       This score is the patient's risk of an unplanned readmission within 30 days of being discharged (0 -100%). The score is based on dignosis, age, lab data, medications, orders, and past utilization.   Low:  0-14.9   Medium: 15-21.9   High: 22-29.9   Extreme: 30 and above           Subjective: Pleasant, no complaints, minimal discussion.  Brother present, no specific questions.   Objective: Vitals:   05/15/23 1440 05/15/23 1502  BP: (!) 104/45 (!) 107/52  Pulse: (!) 54 (!) 57  Resp: 18 15  Temp:  98.9 F (37.2 C)  SpO2: 100% 95%    Intake/Output Summary (Last 24 hours) at 05/15/2023 1541 Last data filed at 05/15/2023 0358 Gross per 24 hour  Intake 871.83 ml  Output --  Net 871.83 ml   Filed Weights   05/13/23 1702 05/15/23 0500  Weight: 68 kg 60.7 kg  Exam:  General:  Appears frail, chronically ill Eyes:   normal lids, iris ENT:  grossly normal hearing, lips & tongue, mmm; poor/absent dentition Cardiovascular:  RRR, no m/r/g. No LE edema.  Respiratory:   CTA bilaterally with no wheezes/rales/rhonchi.  Normal respiratory effort. Abdomen:  soft, NT, distention noted Skin:  no rash or  induration seen on limited exam Musculoskeletal:  grossly normal tone BUE/BLE, good ROM, no bony abnormality Psychiatric:  blunted mood and affect, speech sparse, AOx1-2 Neurologic:  unable to effectively perform, strength appears symmetric  Data Reviewed: I have reviewed the patient's lab results since admission.  Pertinent labs for today include:   CO2 20, stable BUN 30/Creatinine 2.16/GFR 31, improving AP 357 Albumin 2.0 Bili 1.5 WBC 8.9 Hgb 8.7, up from 6.5     Family Communication: Brother was present throughout  Disposition: Status is: Inpatient Remains inpatient appropriate because: ongoing management     Time spent: 50 minutes  Unresulted Labs (From admission, onward)     Start     Ordered   05/16/23 0500  CBC with Differential/Platelet  Tomorrow morning,   R        05/15/23 1540   05/16/23 0500  Basic metabolic panel with GFR  Tomorrow morning,   R        05/15/23 1540   05/15/23 0500  Multiple Myeloma Panel (SPEP&IFE w/QIG)  Tomorrow morning,   R        05/14/23 1804   05/15/23 0500  Kappa/lambda light chains  Tomorrow morning,   R        05/14/23 1804   05/13/23 2119  PSA  Add-on,   AD        05/13/23 2119             Author: Jonah Blue, MD 05/15/2023 3:41 PM  For on call review [email or web address].

## 2023-05-15 NOTE — TOC Initial Note (Signed)
 Transition of Care [facility]) - Initial/Assessment Note    Patient Details  Name: Jeremy [name] MRN: [id number] Date of Birth: [birth year]  Transition of Care [facility]) CM/SW Contact:    Margarito Liner, LCSW Phone Number: 05/15/2023, 10:55 AM  Clinical Narrative:  Patient only oriented to self. Brother at bedside. CSW introduced role and explained that discharge planning would be discussed. Brother confirm that patient is from [facility] and plan is to return there at discharge. No further concerns. CSW will continue to follow patient and his brother for support and facilitate return once medically stable.                  Barriers to Discharge: Continued Medical Work up   Patient Goals and CMS Choice            Expected Discharge Plan and Services     Post Acute Care Choice: NA Living arrangements for the past 2 months: Group Home                                      Prior Living Arrangements/Services Living arrangements for the past 2 months: Group Home Lives with:: Facility Resident Patient language and need for interpreter reviewed:: Yes Do you feel safe going back to the place where you live?: Yes      Need for Family Participation in Patient Care: Yes (Comment) Care giver support system in place?: Yes (comment)   Criminal Activity/Legal Involvement Pertinent to Current Situation/Hospitalization: No - Comment as needed  Activities of Daily Living   ADL Screening (condition at time of admission) Independently performs ADLs?: No Does the patient have a NEW difficulty with bathing/dressing/toileting/self-feeding that is expected to last >3 days?: No Does the patient have a NEW difficulty with getting in/out of bed, walking, or climbing stairs that is expected to last >3 days?: No Does the patient have a NEW difficulty with communication that is expected to last >3 days?: No Is the patient deaf or have difficulty hearing?: No Does the patient have  difficulty seeing, even when wearing glasses/contacts?: No Does the patient have difficulty concentrating, remembering, or making decisions?: Yes  Permission Sought/Granted Permission sought to share information with : Facility Medical sales representative, Family Supports Permission granted to share information with : Yes, Verbal Permission Granted  Share Information with NAME: Hiroyuki Ozanich  Permission granted to share info w AGENCY: [facility]  Permission granted to share info w Relationship: Brother  Permission granted to share info w Contact Information: [phone]  Emotional Assessment Appearance:: Appears stated age Attitude/Demeanor/Rapport: Unable to Assess Affect (typically observed): Unable to Assess Orientation: : Oriented to Self Alcohol / Substance Use: Not Applicable Psych Involvement: No (comment)  Admission diagnosis:  Folate deficiency [E53.8] Cancer (HCC) [C80.1] AKI (acute kidney injury) (HCC) [N17.9] Metastatic malignant neoplasm, unspecified site (HCC) [C79.9] Acute kidney injury superimposed on stage 3b chronic kidney disease (HCC) [N17.9, N18.32] Patient Active Problem List   Diagnosis Date Noted   Palliative care encounter 05/14/2023   Acute kidney injury superimposed on stage 3b chronic kidney disease (HCC) 05/13/2023   Metastasized cancer 05/13/2023   CAD (coronary artery disease) 05/13/2023   Dementia without behavioral disturbance (HCC) 05/13/2023   Leukocytosis 05/13/2023   Normocytic anemia    Chronic combined systolic and diastolic CHF (congestive heart failure) (HCC)    Acute encephalopathy 02/04/2022   Rhabdomyolysis 02/04/2022   Ventricular bigeminy 02/04/2022  NSTEMI (non-ST elevated myocardial infarction) (HCC) 02/04/2022   Stage 3b chronic kidney disease (CKD) (HCC) 02/04/2022   Acute confusion due to infection 09/16/2020   AKI (acute kidney injury) (HCC) 09/16/2020   COVID-19 virus infection 09/15/2020   Prostate cancer (HCC)  06/11/2018   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 [place] [place] [address] Phone: [phone] Fax: [phone]  [facility] - [place], [place] - [address] [place] [address] Phone: [phone] Fax: [phone]     Social Drivers of Health (SDOH) Social History: SDOH Screenings   Food Insecurity: Patient Unable To Answer (05/14/2023)  Housing: Patient Unable To Answer (05/14/2023)  Transportation Needs: Patient Unable To Answer (05/14/2023)  Utilities: Patient Unable To Answer (05/14/2023)  Financial Resource Strain: Low Risk  (10/31/2022)   Received from [facility]  Social Connections: Patient Unable To Answer (05/14/2023)  Tobacco Use: Medium Risk (05/14/2023)   SDOH Interventions:     Readmission Risk Interventions    02/06/2022    3:18 PM  Readmission Risk Prevention Plan  Transportation Screening Complete  Medication Review (RN Care Manager) Complete  SW Recovery Care/Counseling Consult Complete  Palliative Care Screening Not Applicable

## 2023-05-16 DIAGNOSIS — C801 Malignant (primary) neoplasm, unspecified: Secondary | ICD-10-CM | POA: Diagnosis not present

## 2023-05-16 LAB — BASIC METABOLIC PANEL WITH GFR
Anion gap: 9 (ref 5–15)
BUN: 32 mg/dL — ABNORMAL HIGH (ref 8–23)
CO2: 20 mmol/L — ABNORMAL LOW (ref 22–32)
Calcium: 8.5 mg/dL — ABNORMAL LOW (ref 8.9–10.3)
Chloride: 111 mmol/L (ref 98–111)
Creatinine, Ser: 2.17 mg/dL — ABNORMAL HIGH (ref 0.61–1.24)
GFR, Estimated: 31 mL/min — ABNORMAL LOW (ref >60)
Glucose, Bld: 82 mg/dL (ref 70–99)
Potassium: 4.1 mmol/L (ref 3.5–5.1)
Sodium: 140 mmol/L (ref 135–145)

## 2023-05-16 LAB — TYPE AND SCREEN
ABO/RH(D): O POS
Antibody Screen: NEGATIVE
Unit division: 0
Unit division: 0

## 2023-05-16 LAB — CBC WITH DIFFERENTIAL/PLATELET
Abs Immature Granulocytes: 0.1 10*3/uL — ABNORMAL HIGH (ref 0.00–0.07)
Basophils Absolute: 0 10*3/uL (ref 0.0–0.1)
Basophils Relative: 0 %
Eosinophils Absolute: 0.2 10*3/uL (ref 0.0–0.5)
Eosinophils Relative: 2 %
HCT: 26.5 % — ABNORMAL LOW (ref 39.0–52.0)
Hemoglobin: 8.9 g/dL — ABNORMAL LOW (ref 13.0–17.0)
Immature Granulocytes: 1 %
Lymphocytes Relative: 18 %
Lymphs Abs: 1.7 10*3/uL (ref 0.7–4.0)
MCH: 28.1 pg (ref 26.0–34.0)
MCHC: 33.6 g/dL (ref 30.0–36.0)
MCV: 83.6 fL (ref 80.0–100.0)
Monocytes Absolute: 0.7 10*3/uL (ref 0.1–1.0)
Monocytes Relative: 8 %
Neutro Abs: 6.3 10*3/uL (ref 1.7–7.7)
Neutrophils Relative %: 71 %
Platelets: 217 10*3/uL (ref 150–400)
RBC: 3.17 MIL/uL — ABNORMAL LOW (ref 4.22–5.81)
RDW: 18.2 % — ABNORMAL HIGH (ref 11.5–15.5)
WBC: 9 10*3/uL (ref 4.0–10.5)
nRBC: 0 % (ref 0.0–0.2)

## 2023-05-16 LAB — BPAM RBC
Blood Product Expiration Date: 202504222359
Blood Product Expiration Date: 202504222359
ISSUE DATE / TIME: 202503271805
ISSUE DATE / TIME: 202503280042
Unit Type and Rh: 5100
Unit Type and Rh: 5100

## 2023-05-16 LAB — GLUCOSE, CAPILLARY
Glucose-Capillary: 78 mg/dL (ref 70–99)
Glucose-Capillary: 125 mg/dL — ABNORMAL HIGH (ref 70–99)
Glucose-Capillary: 98 mg/dL (ref 70–99)

## 2023-05-16 NOTE — Progress Notes (Signed)
 Progress Note   Patient: Jeremy [name] WUJ:[id number] DOB: 11-Dec-1945 DOA: 05/13/2023     3 DOS: the patient was seen and examined on 05/16/2023   Brief hospital course: 78yo with h/o prostate CA, chronic systolic CHF, HTN, HLD, CAD, dementia, seizure d/o, stage 3B CKD, and cocaine abuse who presented on 3/26 with weight loss. Evaluation in the ER with imaging suggestive of widely metastatic cancer, uncertain primary. Oncology consulting.   Assessment and Plan:  Metastatic cancer Patient presented with FTT from group home in the setting of known dementia CT scan showed metastasized disease to liver, peritoneum, with new right inguinal lymphadenopathy Unclear primary cancer, although he has a history of prostate cancer Oncology is consulting  Admitted to telemetry bed as inpatient PSA 0.24, AFP 2.4 Multiple myeloma and Kappa/lambda light chains pending Palliative consulting Oncology consulted, will need close outpatient f/u   History of prostate cancer Diagnosed in 2019, completed all treatment by 2022 S/p of radiation therapy and prostatectomy PSA has remained normal so recurrence seems less likely Continue Flomax   Acute kidney injury superimposed on stage 3b chronic kidney disease  Likely due to dehydration and continuation of losartan Baseline creatinine 1.81/GFR 38; 3.24/19 on presentation and has seemingly stabilized at 2.17/31, possibly his new baseline No hydronephrosis per CT scan Hold Cozaar Avoid using renal toxic medications   Normocytic anemia Hemoglobin 7.7 on presentation (8.5 on 03/28/2023); dropped to 6.5 today No rectal bleeding or dark stool Anemia panel with low iron and folate, elevated ferritin Brother provided consent for blood, will transfuse 2 units PRBC Hgb improved and stable at this time   CAD (coronary artery disease) Hold aspirin due to worsening anemia Continue rosuvastatin   Chronic combined systolic and diastolic CHF (congestive heart  failure Echo on 02/05/2022 showed EF of 40-45% with grade 1 diastolic dysfunction CHF seems to be compensated Watch volume status closely   Dementia without behavioral disturbance Fall precautions Delirium precautions   DNR I discussed code status with the brother and the patient would not desire resuscitation and would prefer to die a natural death should that situation arise Code status changed Patient will need a gold out of facility DNR form at the time of discharge    Disposition Patient lives in a group home and will need to be at close to baseline level of function for dc Per discussion with [name] and patient's brother, his baseline is ambulatory about 50-100 feet and conversant PT consulted He does not appear to be at baseline currently      Consultants: Oncology IR Palliative care   Procedures: IR liver mass biopsy 3/27   Antibiotics: None   30 Day Unplanned Readmission Risk Score    Flowsheet Row ED to Hosp-Admission (Current) from 05/13/2023 in [facility]  30 Day Unplanned Readmission Risk Score (%) 39.8 Filed at 05/16/2023 1200       This score is the patient's risk of an unplanned readmission within 30 days of being discharged (0 -100%). The score is based on dignosis, age, lab data, medications, orders, and past utilization.   Low:  0-14.9   Medium: 15-21.9   High: 22-29.9   Extreme: 30 and above           Subjective: No apparent complaints.  Pleasant but with sparse speech.   Objective: Vitals:   05/16/23 0341 05/16/23 0818  BP: (!) 110/57 (!) 115/51  Pulse: 66 65  Resp: 16   Temp: 98 F (36.7  C) 98.4 F (36.9 C)  SpO2: 100% 96%    Intake/Output Summary (Last 24 hours) at 05/16/2023 1431 Last data filed at 05/15/2023 1600 Gross per 24 hour  Intake 100 ml  Output --  Net 100 ml   Filed Weights   05/13/23 1702 05/15/23 0500 05/16/23 0500  Weight: 68 kg 60.7 kg 58.8 kg     Exam:  General:  Appears frail, chronically ill Eyes:   normal lids, iris ENT:  grossly normal hearing, lips & tongue, mmm; poor/absent dentition Cardiovascular:  RRR, no m/r/g. No LE edema.  Respiratory:   CTA bilaterally with no wheezes/rales/rhonchi.  Normal respiratory effort. Abdomen:  soft, NT, distention noted Skin:  no rash or induration seen on limited exam Musculoskeletal:  grossly normal tone BUE/BLE, good ROM, no bony abnormality Psychiatric:  blunted mood and affect, speech sparse, AOx1-2 Neurologic:  unable to effectively perform, strength appears symmetric  Data Reviewed: I have reviewed the patient's lab results since admission.  Pertinent labs for today include:   CO2 20, stable, not clinically significant BUN 32/Creatinine 2.17/GFR 31, stable WBC 9 Hgb 8.9, stable     Family Communication: None present  Disposition: Status is: Inpatient Remains inpatient appropriate because: ongoing management     Time spent: 50 minutes  Unresulted Labs (From admission, onward)     Start     Ordered   05/17/23 0500  CBC with Differential/Platelet  Tomorrow morning,   R        05/16/23 1431   05/17/23 0500  Basic metabolic panel with GFR  Tomorrow morning,   R        05/16/23 1431   05/15/23 0500  Multiple Myeloma Panel (SPEP&IFE w/QIG)  Tomorrow morning,   R        05/14/23 1804   05/15/23 0500  Kappa/lambda light chains  Tomorrow morning,   R        05/14/23 1804             Author: Jonah Blue, MD 05/16/2023 2:31 PM  For on call review [email or web address].

## 2023-05-16 NOTE — Progress Notes (Signed)
 Patient lethargic this am and requiring frequent reminders to swallow medications in applesauce. Ensure provided and patient tolerated well for approximately half of the serving but then began to cough. Patient sitting upright and awake during this time. Notified Dr. Ophelia Charter for potential need of speech evaluation due to safety with oral intake.

## 2023-05-16 NOTE — Evaluation (Signed)
 Physical Therapy Evaluation Patient Details Name: [name] MRN: [id number] DOB: Sep 30, 1945 Today's Date: 05/16/2023  History of Present Illness  78yo with h/o prostate CA, chronic systolic CHF, HTN, HLD, CAD, dementia, seizure d/o, stage 3B CKD, and cocaine abuse who presented on 3/26 with weight loss. Evaluation in the ER with imaging suggestive of widely metastatic cancer, uncertain primary. Oncology consulting.  Clinical Impression  Pt received in Semi-Fowler's position and agreeable to therapy.  Pt unable to effectively answer a majority of questions, but is alert and oriented to person, but person only.  Pt follows short one step commands, however moves extremely slowly.  Pt performed bed mobility and required modA to come to the EOB and verbal cues for proper set up to stand from slightly elevated bed (due to height of pt).  Pt then ambulated to the hallway and turned back around.  Pt tends to turn body to the R side when ambulating and also keep head turned to the R as well.  Pt then assisted back to the bed with linens changed and new gown donned due to pt voiding in the bed.  Pt left with all needs met and call bell within reach.          If plan is discharge home, recommend the following: A lot of help with walking and/or transfers;A lot of help with bathing/dressing/bathroom;Help with stairs or ramp for entrance;Assist for transportation   Can travel by private vehicle        Equipment Recommendations None recommended by PT  Recommendations for Other Services       Functional Status Assessment Patient has had a recent decline in their functional status and demonstrates the ability to make significant improvements in function in a reasonable and predictable amount of time.     Precautions / Restrictions Restrictions Weight Bearing Restrictions Per Provider Order: No      Mobility  Bed Mobility Overal bed mobility: Needs Assistance Bed Mobility: Supine to Sit      Supine to sit: Mod assist     General bed mobility comments: bringing legs to EOB.    Transfers Overall transfer level: Needs assistance Equipment used: Rolling walker (2 wheels) Transfers: Sit to/from Stand Sit to Stand: Min assist           General transfer comment: elevated bed due to height of pt.  verbalc ues for proper technique    Ambulation/Gait Ambulation/Gait assistance: Contact guard assist Gait Distance (Feet): 30 Feet Assistive device: Rolling walker (2 wheels) Gait Pattern/deviations: Step-to pattern Gait velocity: significantly reduced     General Gait Details: Pt with significantly slowed gait pattern and has some instances of instability that requires assistance from therapist to remain upright.  Stairs            Wheelchair Mobility     Tilt Bed    Modified Rankin (Stroke Patients Only)       Balance Overall balance assessment: Needs assistance Sitting-balance support: Bilateral upper extremity supported, Feet supported Sitting balance-Leahy Scale: Fair     Standing balance support: Bilateral upper extremity supported, During functional activity, Reliant on assistive device for balance Standing balance-Leahy Scale: Poor                               Pertinent Vitals/Pain      Home Living Family/patient expects to be discharged to:: Group home  Additional Comments: Came from group home according to chart review.    Prior Function Prior Level of Function : Needs assist  Cognitive Assist : ADLs (cognitive)   ADLs (Cognitive): Set up cues Physical Assist : ADLs (physical)             Extremity/Trunk Assessment   Upper Extremity Assessment Upper Extremity Assessment: Generalized weakness    Lower Extremity Assessment Lower Extremity Assessment: Generalized weakness       Communication   Communication Communication: No apparent difficulties    Cognition Arousal:  Alert Behavior During Therapy: Flat affect   PT - Cognitive impairments: History of cognitive impairments                         Following commands: Impaired Following commands impaired: Follows one step commands with increased time     Cueing Cueing Techniques: Verbal cues, Tactile cues, Visual cues     General Comments      Exercises     Assessment/Plan    PT Assessment Patient needs continued PT services  PT Problem List Decreased strength;Decreased activity tolerance;Decreased balance;Decreased mobility;Decreased cognition;Decreased knowledge of use of DME;Decreased safety awareness       PT Treatment Interventions DME instruction;Gait training;Functional mobility training;Therapeutic activities;Therapeutic exercise;Balance training;Neuromuscular re-education    PT Goals (Current goals can be found in the Care Plan section)  Acute Rehab PT Goals Patient Stated Goal: none PT Goal Formulation: Patient unable to participate in goal setting Time For Goal Achievement: 05/30/23 Potential to Achieve Goals: Fair    Frequency Min 2X/week     Co-evaluation               AM-PAC PT "6 Clicks" Mobility  Outcome Measure Help needed turning from your back to your side while in a flat bed without using bedrails?: A Little Help needed moving from lying on your back to sitting on the side of a flat bed without using bedrails?: A Lot Help needed moving to and from a bed to a chair (including a wheelchair)?: A Little Help needed standing up from a chair using your arms (e.g., wheelchair or bedside chair)?: A Little Help needed to walk in hospital room?: A Little Help needed climbing 3-5 steps with a railing? : A Lot 6 Click Score: 16    End of Session Equipment Utilized During Treatment: Gait belt Activity Tolerance: Patient tolerated treatment well;Patient limited by fatigue Patient left: in bed;with call bell/phone within reach Nurse Communication: Mobility  status PT Visit Diagnosis: Other abnormalities of gait and mobility (R26.89);Muscle weakness (generalized) (M62.81);Unsteadiness on feet (R26.81);History of falling (Z91.81);Difficulty in walking, not elsewhere classified (R26.2)    Time: 1610-9604 PT Time Calculation (min) (ACUTE ONLY): 21 min   Charges:   PT Evaluation $PT Eval Moderate Complexity: 1 Mod PT Treatments $Therapeutic Activity: 8-22 mins PT General Charges $$ ACUTE PT VISIT: 1 Visit         Nolon Bussing, PT, DPT Physical Therapist - [facility]  05/16/23, 2:17 PM

## 2023-05-17 DIAGNOSIS — C801 Malignant (primary) neoplasm, unspecified: Secondary | ICD-10-CM | POA: Diagnosis not present

## 2023-05-17 LAB — CBC WITH DIFFERENTIAL/PLATELET
Abs Immature Granulocytes: 0.08 10*3/uL — ABNORMAL HIGH (ref 0.00–0.07)
Basophils Absolute: 0 10*3/uL (ref 0.0–0.1)
Basophils Relative: 1 %
Eosinophils Absolute: 0.2 10*3/uL (ref 0.0–0.5)
Eosinophils Relative: 2 %
HCT: 25.4 % — ABNORMAL LOW (ref 39.0–52.0)
Hemoglobin: 8.5 g/dL — ABNORMAL LOW (ref 13.0–17.0)
Immature Granulocytes: 1 %
Lymphocytes Relative: 17 %
Lymphs Abs: 1.5 10*3/uL (ref 0.7–4.0)
MCH: 29.2 pg (ref 26.0–34.0)
MCHC: 33.5 g/dL (ref 30.0–36.0)
MCV: 87.3 fL (ref 80.0–100.0)
Monocytes Absolute: 0.7 10*3/uL (ref 0.1–1.0)
Monocytes Relative: 9 %
Neutro Abs: 6.1 10*3/uL (ref 1.7–7.7)
Neutrophils Relative %: 70 %
Platelets: 198 10*3/uL (ref 150–400)
RBC: 2.91 MIL/uL — ABNORMAL LOW (ref 4.22–5.81)
RDW: 18.4 % — ABNORMAL HIGH (ref 11.5–15.5)
WBC: 8.6 10*3/uL (ref 4.0–10.5)
nRBC: 0 % (ref 0.0–0.2)

## 2023-05-17 LAB — BASIC METABOLIC PANEL WITH GFR
Anion gap: 8 (ref 5–15)
BUN: 30 mg/dL — ABNORMAL HIGH (ref 8–23)
CO2: 22 mmol/L (ref 22–32)
Calcium: 8.5 mg/dL — ABNORMAL LOW (ref 8.9–10.3)
Chloride: 112 mmol/L — ABNORMAL HIGH (ref 98–111)
Creatinine, Ser: 2.02 mg/dL — ABNORMAL HIGH (ref 0.61–1.24)
GFR, Estimated: 33 mL/min — ABNORMAL LOW (ref >60)
Glucose, Bld: 84 mg/dL (ref 70–99)
Potassium: 3.7 mmol/L (ref 3.5–5.1)
Sodium: 142 mmol/L (ref 135–145)

## 2023-05-17 NOTE — Plan of Care (Signed)

## 2023-05-17 NOTE — Progress Notes (Signed)
 [facility] LIAISON NOTE  Received request from Gabriel Cirri, RN, Transitions of Care Manager, for evaluation for Hospice InPatient Unit [facility]). Met at bedside.  No family present.  Found Mr. Iten standing up facing the wall completely naked.  He was holding his hospital blanket up and when I asked him what he was trying to do, he stated "trying to put my clothes on".  Of note, all 4 of patient's bed rails were up, so patient must have crawled over his rails.  I assisted him back to bed and covered him up.  He was able to answer questions with a few words.  Notified Dr. Ophelia Charter and patient's RN, Merry Proud of my findings.    At this time, patient is not appropriate for IPU.  No acute symptoms to manage.  We are able to serve patient back at his group home with hospice.  Later received notification from Dr. Ophelia Charter who told me Merry Proud, RN said brother was at bedside.  I went back to patient's room and spoke with to Darlen Round, brother to initiate education related to hospice philosophy, services, and team approach to care. Patient/family verbalized understanding of information given. During my visit I noted both patient's breakfast and lunch trays untouched at bedside.  Patient's tray appeared to be regular diet, which is food Mr. Seiber is unable to eat due to poor dentation with only approx 4 teeth.  He told me he would like vanilla icecream.  He at the full container.  He then drank a full container of tea and a full can of Shasta Cola.  Dr. Ophelia Charter came in during my visit for re-evaluation of patient.  She has ordered a speech consult which will help determine diet change/recommendations.  Patient will need to be fed for all meals.   At this time, discharge plan is unknown.  Will see if patient is having a rally or if diet change and patient being feed helps.  DME needs discussed- to be determined when d/c plan is established.  Above information shared with Barbie,  Transitions of Care Manager, Merry Proud , patient's RNj and hospital medical care team.  Please call with any hospice related questions or concerns.  Thank you for the opportunity to participate in this patient's care.   Norris Cross, MA, BSN, RN, FNE Nurse Liaison [phone]

## 2023-05-17 NOTE — Progress Notes (Signed)
 Progress Note   Patient: Jeremy [name] ZOX:[id number] DOB: [birth year] DOA: 05/13/2023     4 DOS: the patient was seen and examined on 05/17/2023   Brief hospital course: 78yo with h/o prostate CA, chronic systolic CHF, HTN, HLD, CAD, dementia, seizure d/o, stage 3B CKD, and cocaine abuse who presented on 3/26 with weight loss. Evaluation in the ER with imaging suggestive of widely metastatic cancer, uncertain primary. Oncology consulting.   Assessment and Plan:  Metastatic cancer Patient presented with FTT from group home in the setting of known dementia CT scan showed metastasized disease to liver, peritoneum, with new right inguinal lymphadenopathy Unclear primary cancer, although he has a history of prostate cancer Oncology is consulting  PSA 0.24, AFP 2.4 Multiple myeloma and Kappa/lambda light chains pending Palliative consulting He is continuing to deteriorate clinically and appears to be residential hospice-appropriate I have discussed this with his brother, who is in agreement Hospice liaison notified and will see him   History of prostate cancer Diagnosed in 2019, completed all treatment by 2022 S/p of radiation therapy and prostatectomy PSA has remained normal so recurrence seems less likely Continue Flomax   Acute kidney injury superimposed on stage 3b chronic kidney disease  Likely due to dehydration and continuation of losartan Baseline creatinine 1.81/GFR 38; 3.24/19 on presentation and continues to improve, currently 2.02/33 No hydronephrosis per CT scan Hold Cozaar Avoid using renal toxic medications   Normocytic anemia Hemoglobin 7.7 on presentation (8.5 on 03/28/2023); dropped to 6.5 today No rectal bleeding or dark stool Anemia panel with low iron and folate, elevated ferritin Brother provided consent for blood, will transfuse 2 units PRBC Hgb improved and stable at this time but slightly lower than yesterday   CAD (coronary artery disease) Hold  aspirin due to worsening anemia Continue rosuvastatin   Chronic combined systolic and diastolic CHF (congestive heart failure Echo on 02/05/2022 showed EF of 40-45% with grade 1 diastolic dysfunction CHF seems to be compensated Watch volume status closely   Dementia without behavioral disturbance Fall precautions Delirium precautions   DNR I discussed code status with the brother and the patient would not desire resuscitation and would prefer to die a natural death should that situation arise Code status changed Patient will need a gold out of facility DNR form at the time of discharge     Disposition Patient lives in a group home and will need to be at close to baseline level of function for dc Per discussion with Jeremy [name] and patient's brother, his baseline is ambulatory about 50-100 feet and conversant PT consulted He does not appear to be at baseline currently and has deteriorated daily He is frail and cachectic and given his metastatic malignancy, he appears unlikely to survive at this time Hospice consulted, suggest transition to comfort care and transfer to residential hospice     Consultants: Oncology IR Palliative care   Procedures: IR liver mass biopsy 3/27   Antibiotics: None    30 Day Unplanned Readmission Risk Score    Flowsheet Row ED to Hosp-Admission (Current) from 05/13/2023 in [facility]  30 Day Unplanned Readmission Risk Score (%) 37.03 Filed at 05/17/2023 0801       This score is the patient's risk of an unplanned readmission within 30 days of being discharged (0 -100%). The score is based on dignosis, age, lab data, medications, orders, and past utilization.   Low:  0-14.9   Medium: 15-21.9   High: 22-29.9  Extreme: 30 and above           Subjective: Patient is deteriorating daily.   Objective: Vitals:   05/17/23 0423 05/17/23 0808  BP: (!) 114/53 (!) 106/56  Pulse: 69 69  Resp: 18 16  Temp:  97.6 F (36.4 C) 98.3 F (36.8 C)  SpO2: 95% 97%   No intake or output data in the 24 hours ending 05/17/23 1224 Filed Weights   05/15/23 0500 05/16/23 0500 05/17/23 0500  Weight: 60.7 kg 58.8 kg 58.3 kg    Exam:  General:  Appears frail, chronically ill, cachectic Eyes:   normal lids, iris ENT:  grossly normal hearing, lips & tongue, mmm; poor/absent dentition Cardiovascular:  RRR, no m/r/g. No LE edema.  Respiratory:   CTA bilaterally with no wheezes/rales/rhonchi.  Normal respiratory effort. Abdomen:  soft, NT, distention noted Skin:  no rash or induration seen on limited exam Musculoskeletal:  grossly normal tone BUE/BLE, good ROM, no bony abnormality Psychiatric:  blunted mood and affect, speech sparse, AOx1-2 Neurologic:  unable to effectively perform, strength appears symmetric  Data Reviewed: I have reviewed the patient's lab results since admission.  Pertinent labs for today include:   Glucose 112 BUN 30/Creatinine 2.02/GFR 33 WBC 8.6 Hgb 8.5     Family Communication: None present; I spoke with his brother Jeremy [name], from [place]) and was unable to reach his other brother Jeremy [name], in [place])  Disposition: Status is: Inpatient Remains inpatient appropriate because: seriously ill, likely terminally     Time spent: 50 minutes  Unresulted Labs (From admission, onward)     Start     Ordered   05/15/23 0500  Multiple Myeloma Panel (SPEP&IFE w/QIG)  Tomorrow morning,   R        05/14/23 1804   05/15/23 0500  Kappa/lambda light chains  Tomorrow morning,   R        05/14/23 1804             Author: Jonah Blue, MD 05/17/2023 12:24 PM  For on call review [email or web address].

## 2023-05-17 NOTE — Progress Notes (Signed)
 Patient requiring multiple cues for swallowing applesauce. Dentition is also poor.  Per report, previous RN unable to safely administer medications due to patient's inattention and lethargy. Request made to Dr. Ophelia Charter this am for speech consult for safe and appropriate diet.

## 2023-05-18 DIAGNOSIS — C801 Malignant (primary) neoplasm, unspecified: Secondary | ICD-10-CM | POA: Diagnosis not present

## 2023-05-18 DIAGNOSIS — C799 Secondary malignant neoplasm of unspecified site: Secondary | ICD-10-CM

## 2023-05-18 LAB — MULTIPLE MYELOMA PANEL, SERUM
Albumin SerPl Elph-Mcnc: 2.1 g/dL — ABNORMAL LOW (ref 2.9–4.4)
Albumin/Glob SerPl: 0.7 (ref 0.7–1.7)
Alpha 1: 0.3 g/dL (ref 0.0–0.4)
Alpha2 Glob SerPl Elph-Mcnc: 0.7 g/dL (ref 0.4–1.0)
B-Globulin SerPl Elph-Mcnc: 0.8 g/dL (ref 0.7–1.3)
Gamma Glob SerPl Elph-Mcnc: 1.4 g/dL (ref 0.4–1.8)
Globulin, Total: 3.2 g/dL (ref 2.2–3.9)
IgA: 345 mg/dL (ref 61–437)
IgG (Immunoglobin G), Serum: 1564 mg/dL (ref 603–1613)
IgM (Immunoglobulin M), Srm: 70 mg/dL (ref 15–143)
Total Protein ELP: 5.3 g/dL — ABNORMAL LOW (ref 6.0–8.5)

## 2023-05-18 LAB — KAPPA/LAMBDA LIGHT CHAINS
Kappa free light chain: 101.5 mg/L — ABNORMAL HIGH (ref 3.3–19.4)
Kappa, lambda light chain ratio: 1.63 (ref 0.26–1.65)
Lambda free light chains: 62.4 mg/L — ABNORMAL HIGH (ref 5.7–26.3)

## 2023-05-18 NOTE — Care Management Important Message (Signed)
 Important Message  Patient Details  Name: Jeremy [name] MRN: [id number] Date of Birth: 1946/01/26   Important Message Given:  Yes - Medicare IM     Cristela Blue, CMA 05/18/2023, 11:37 AM

## 2023-05-18 NOTE — Progress Notes (Signed)
 [facility] LIAISON NOTE   Patient remains inappropriate for the Hospice Home at this time.  No current acute symptom management needs.  Patient observed walking with walker and assistance from mobility specialist from his bed to the room door and back.  RN stated that he ate about 30% of his breakfast and that SLP was able to get himto eat some.  Will re-evaluate if there are significant changes in the patient's condition.    Please call with any Hospice related questions or concerns.  Thank you for the opportunity to participate in this patient's care.  [facility] Liaison [phone]

## 2023-05-18 NOTE — Plan of Care (Signed)

## 2023-05-18 NOTE — Progress Notes (Signed)
 Palliative Medicine [facility] at [facility] [phone] [phone]   Name: Jeremy [name] Date: 05/18/2023 MRN: [id number]  DOB: April 29, 1945  Patient Care Team: Marguarite Arbour, MD as PCP - General (Internal Medicine) Carmina Miller, MD as Radiation Oncologist (Radiation Oncology)    REASON FOR CONSULTATION: Jeremy [name] is a 78 y.o. male with multiple medical problems including dementia, cardiomyopathy with EF of 40 to 45% and history of CHF, history of seizure, CKD 3B, and history of prostate cancer status post RT and ADT (ADT completed 10/2020).  Patient presented to hospital for evaluation of weight loss and symptomatic anemia.  CT of the abdomen and pelvis revealed multiple liver lesions, inguinal lymphadenopathy and peritoneal nodules and small pulmonary nodules concerning for metastatic disease.  Palliative care consulted to address goals. .   CODE STATUS: DNR  PAST MEDICAL HISTORY: Past Medical History:  Diagnosis Date   Anemia    CAD (coronary artery disease)    Cancer (HCC) 2019   prostate /radiation   Chronic combined systolic and diastolic CHF (congestive heart failure) (HCC)    Dementia (HCC)    HLD (hyperlipidemia)    Hypertension    Prostate cancer (HCC)    Seizures (HCC)    last seizure 2020    PAST SURGICAL HISTORY:  Past Surgical History:  Procedure Laterality Date   FLEXOR TENDON REPAIR Right 02/12/2021   Procedure: Right hand contracture release - Flexor release, pinning of PIP joints;  Surgeon: Kennedy Bucker, MD;  Location: [facility] ORS;  Service: Orthopedics;  Laterality: Right;   PROSTATE BIOPSY N/A 12/15/2017   Procedure: BIOPSY TRANSRECTAL ULTRASONIC PROSTATE (TUBP);  Surgeon: Riki Altes, MD;  Location: [facility] ORS;  Service: Urology;  Laterality: N/A;   ROBOT ASSISTED INGUINAL HERNIA REPAIR Right 09/02/2018   Procedure: ROBOT ASSISTED INGUINAL HERNIA REPAIR AND UMBILICAL HERNIA,  RIGHT;  Surgeon: Leafy Ro, MD;  Location: [facility] ORS;  Service: General;  Laterality: Right;    HEMATOLOGY/ONCOLOGY HISTORY:  Oncology History   No history exists.    ALLERGIES:  has no known allergies.  MEDICATIONS:  Current Facility-Administered Medications  Medication Dose Route Frequency Provider Last Rate Last Admin   acetaminophen (TYLENOL) tablet 650 mg  650 mg Oral Q6H PRN Lorretta Harp, MD       carvedilol (COREG) tablet 6.25 mg  6.25 mg Oral BID WC Lorretta Harp, MD   6.25 mg at 05/18/23 1424   feeding supplement (ENSURE ENLIVE / ENSURE PLUS) liquid 237 mL  237 mL Oral BID BM Jonah Blue, MD   237 mL at 05/18/23 1424   ferrous sulfate tablet 325 mg  325 mg Oral BID WC Lorretta Harp, MD   325 mg at 05/18/23 1424   folic acid (FOLVITE) tablet 1 mg  1 mg Oral Daily Creig Hines, MD   1 mg at 05/18/23 1424   hydrALAZINE (APRESOLINE) injection 5 mg  5 mg Intravenous Q2H PRN Lorretta Harp, MD       HYDROcodone-acetaminophen (NORCO/VICODIN) 5-325 MG per tablet 1-2 tablet  1-2 tablet Oral Q6H PRN Lorretta Harp, MD   1 tablet at 05/15/23 1006   lamoTRIgine (LAMICTAL) tablet 25 mg  25 mg Oral BID Lorretta Harp, MD   25 mg at 05/18/23 1424   levETIRAcetam (KEPPRA) tablet 500 mg  500 mg Oral BID Lorretta Harp, MD   500 mg at 05/18/23 1424   loperamide (IMODIUM) capsule 2 mg  2 mg Oral QID PRN  Lorretta Harp, MD       LORazepam (ATIVAN) injection 2 mg  2 mg Intravenous Q2H PRN Lorretta Harp, MD       ondansetron [facility]) injection 4 mg  4 mg Intravenous Q8H PRN Lorretta Harp, MD       risperiDONE (RISPERDAL) tablet 1 mg  1 mg Oral TID Lorretta Harp, MD   1 mg at 05/18/23 1423   rosuvastatin (CRESTOR) tablet 10 mg  10 mg Oral Daily Lorretta Harp, MD   10 mg at 05/18/23 1423   tamsulosin (FLOMAX) capsule 0.4 mg  0.4 mg Oral QPC supper Lorretta Harp, MD   0.4 mg at 05/17/23 1625    VITAL SIGNS: BP (!) 150/86 (BP Location: Left Arm)   Pulse 95   Temp 98.7 F (37.1 C)   Resp 16   Ht 6' (1.829 m)   Wt 128 lb 8.5 oz  (58.3 kg)   SpO2 95%   BMI 17.43 kg/m  Filed Weights   05/15/23 0500 05/16/23 0500 05/17/23 0500  Weight: 133 lb 13.1 oz (60.7 kg) 129 lb 10.1 oz (58.8 kg) 128 lb 8.5 oz (58.3 kg)    Estimated body mass index is 17.43 kg/m as calculated from the following:   Height as of this encounter: 6' (1.829 m).   Weight as of this encounter: 128 lb 8.5 oz (58.3 kg).  LABS: CBC:    Component Value Date/Time   WBC 8.6 05/17/2023 0418   HGB 8.5 (L) 05/17/2023 0418   HGB 12.9 (L) 08/14/2013 2304   HCT 25.4 (L) 05/17/2023 0418   HCT 39.4 (L) 08/14/2013 2304   PLT 198 05/17/2023 0418   PLT 180 08/14/2013 2304   MCV 87.3 05/17/2023 0418   MCV 91 08/14/2013 2304   NEUTROABS 6.1 05/17/2023 0418   NEUTROABS 3.2 07/28/2013 1223   LYMPHSABS 1.5 05/17/2023 0418   LYMPHSABS 1.3 07/28/2013 1223   MONOABS 0.7 05/17/2023 0418   MONOABS 0.5 07/28/2013 1223   EOSABS 0.2 05/17/2023 0418   EOSABS 0.1 07/28/2013 1223   BASOSABS 0.0 05/17/2023 0418   BASOSABS 0.0 07/28/2013 1223   Comprehensive Metabolic Panel:    Component Value Date/Time   NA 142 05/17/2023 0418   NA 137 08/14/2013 2304   K 3.7 05/17/2023 0418   K 3.8 08/14/2013 2304   CL 112 (H) 05/17/2023 0418   CL 104 08/14/2013 2304   CO2 22 05/17/2023 0418   CO2 24 08/14/2013 2304   BUN 30 (H) 05/17/2023 0418   BUN 10 08/14/2013 2304   CREATININE 2.02 (H) 05/17/2023 0418   CREATININE 1.40 (H) 08/14/2013 2304   GLUCOSE 84 05/17/2023 0418   GLUCOSE 110 (H) 08/14/2013 2304   CALCIUM 8.5 (L) 05/17/2023 0418   CALCIUM 8.2 (L) 08/14/2013 2304   AST 39 05/15/2023 0549   ALT 26 05/15/2023 0549   ALKPHOS 357 (H) 05/15/2023 0549   BILITOT 1.5 (H) 05/15/2023 0549   PROT 5.8 (L) 05/15/2023 0549   ALBUMIN 2.0 (L) 05/15/2023 0549    RADIOGRAPHIC STUDIES: NM Bone Scan Whole Body Result Date: 05/15/2023 CLINICAL DATA:  History of prostate cancer with radiation therapy in 2019. Poor appetite, decreased oral intake and weight loss. Metastatic  disease evaluation. Recent PSA levels pending. Recent liver biopsy. EXAM: NUCLEAR MEDICINE WHOLE BODY BONE SCAN TECHNIQUE: Whole body anterior and posterior images were obtained approximately 3 hours after intravenous injection of radiopharmaceutical. RADIOPHARMACEUTICALS:  21.54 mCi Technetium-71m MDP IV COMPARISON:  Whole-body bone scan 01/28/2018.  Chest CT  05/14/2023. FINDINGS: New heterogeneous uptake throughout the spine with focally increased uptake in the left aspect of the thoracic spine at approximately T7. New mild multifocal rib uptake. On the left, some of this activity is along the anterior costal margin and may be secondary to old fractures. No definite abnormal uptake within the bony pelvis or extremities. The soft tissue activity is within physiologic limits. IMPRESSION: 1. New multifocal uptake within the thoracolumbar spine and ribs, most pronounced on the left at T7. Findings are suspicious for osseous metastatic disease. 2. Recommend correlation with pending serum PSA levels and recent liver biopsy results. Consider further evaluation with MRI of the thoracic spine without and with contrast. Electronically Signed   By: Carey Bullocks M.D.   On: 05/15/2023 14:24   US BIOPSY (LIVER) Result Date: 05/14/2023 INDICATION: [id number] Liver masses [id number] EXAM: ULTRASOUND GUIDED LIVER MASS BIOPSY COMPARISON:  CT AP, 05/13/2023. MEDICATIONS: None ANESTHESIA/SEDATION: Local anesthetic and single agent sedation was employed during this procedure. A total of fentanyl 25 mcg was administered intravenously. The patient's level of consciousness and vital signs were monitored continuously by radiology nursing throughout the procedure under my direct supervision. COMPLICATIONS: None immediate. PROCEDURE: Informed written consent was obtained from the patient and/or patient's representative after a discussion of the risks, benefits and alternatives to treatment. The patient understands and consents the procedure.  A timeout was performed prior to the initiation of the procedure. Ultrasound scanning was performed of the right upper abdominal quadrant demonstrates innumerable, multifocal liver masses The RIGHT hepatic lobe was selected for biopsy and the procedure was planned. The right upper abdominal quadrant was prepped and draped in the usual sterile fashion. The overlying soft tissues were anesthetized with 1% lidocaine. A 17 gauge co-axial needle was advanced into a peripheral aspect of the lesion. This was followed by 4 core biopsies with an 18 gauge core device under direct ultrasound guidance. The needle was removed, then superficial hemostasis was obtained with manual compression. Post procedural scanning was negative for definitive area of hemorrhage or additional complication. A dressing was placed. The patient tolerated the procedure well without immediate post procedural complication. IMPRESSION: 1. Innumerable, multifocal lesions replacing liver. 2. Successful ultrasound-guided core needle biopsy of a liver mass. Roanna Banning, MD [facility] Electronically Signed   By: Roanna Banning M.D.   On: 05/14/2023 17:05   CT CHEST WO CONTRAST Result Date: 05/14/2023 CLINICAL DATA:  Altered mental status with abnormal renal function and anemia with weight loss. Evaluation of occult malignancy EXAM: CT CHEST WITHOUT CONTRAST TECHNIQUE: Multidetector CT imaging of the chest was performed following the standard protocol without IV contrast. RADIATION DOSE REDUCTION: This exam was performed according to the departmental dose-optimization program which includes automated exposure control, adjustment of the mA and/or kV according to patient size and/or use of iterative reconstruction technique. COMPARISON:  Chest radiograph dated 05/13/2023, CTA chest dated 02/04/2022, 01/13/2015 FINDINGS: Cardiovascular: Normal heart size. No significant pericardial fluid/thickening.  Great vessels are normal in course and caliber. Coronary artery calcifications and aortic atherosclerosis. Mediastinum/Nodes: Imaged thyroid gland without nodules meeting criteria for imaging follow-up by size. Normal esophagus. No pathologically enlarged axillary, supraclavicular, mediastinal, or hilar lymph nodes. Lungs/Pleura: The central airways are patent. Mild upper lung predominant paraseptal emphysema. New basilar left lower lobe nodules measuring 6 x 5 mm (3:89), 5 x 5 mm (3:94), and 10 x 5 mm (3:95). Additional 3 mm central left lower lobe nodule (3:87). Unchanged irregular, spiculated 7 x 6  mm right middle lobe nodule (3:84) dating back to 2016, likely benign. Bilateral lower lobe linear atelectasis/scarring. No pneumothorax. Trace bilateral pleural effusions. Upper abdomen: Mildly nodular hepatic contour. Heterogeneous appearance of the hepatic parenchyma. Please see separately dictated CT abdomen and pelvis report for detailed findings. Musculoskeletal: No acute or abnormal lytic or blastic osseous lesions. Multilevel degenerative changes of the thoracic spine. IMPRESSION: 1. New basilar left lower lobe nodules, indeterminate. 2. Trace bilateral pleural effusions. 3. Mildly nodular hepatic contour. Heterogeneous appearance of the hepatic parenchyma. Please see separately dictated CT abdomen and pelvis report for detailed findings. 4. Aortic Atherosclerosis (ICD10-I70.0) and Emphysema (ICD10-J43.9). Coronary artery calcifications. Assessment for potential risk factor modification, dietary therapy or pharmacologic therapy may be warranted, if clinically indicated. Electronically Signed   By: Agustin Cree M.D.   On: 05/14/2023 11:57   CT ABDOMEN PELVIS WO CONTRAST Result Date: 05/13/2023 CLINICAL DATA:  Acute kidney injury. Loss of appetite. Weight loss. Evaluate for malignancy. EXAM: CT ABDOMEN AND PELVIS WITHOUT CONTRAST TECHNIQUE: Multidetector CT imaging of the abdomen and pelvis was performed  following the standard protocol without IV contrast. RADIATION DOSE REDUCTION: This exam was performed according to the departmental dose-optimization program which includes automated exposure control, adjustment of the mA and/or kV according to patient size and/or use of iterative reconstruction technique. COMPARISON:  01/28/2018 and chest CTA 02/04/2022 FINDINGS: Lower chest: Irregular pulmonary nodule in right middle lobe measures 6 mm on image 9/5 is stable since 2023. Scarring or atelectasis in the posterior right lower lobe. New pulmonary nodules at the posterior left lower lobe on image 18/5, largest measures 6 mm. Question another new nodule in the right middle lobe region on image 4/5 that measures 3 mm. Hepatobiliary: Liver is enlarged and diffusely heterogeneous. Nodular contour of the liver. Findings are concerning for multiple hepatic lesions. Largest lesion appears to be in the posterior right hepatic lobe on image 43/3 that measures roughly 2.8 cm. Limited evaluation for biliary dilatation on this study without intravascular contrast. Gallbladder is not distended. Pancreas: Unremarkable. No pancreatic ductal dilatation or surrounding inflammatory changes. Spleen: Normal in size without focal abnormality. Adrenals/Urinary Tract: Normal adrenal glands. High-density material in the right kidney lower pole region could represent small stones. No hydronephrosis. Calcification in the left kidney lower pole may be in the cortex. Probable left renal cysts but poorly characterized on this exam without intravascular contrast. Mild bladder wall thickening is likely chronic. Stomach/Bowel: Normal appearance of the stomach. Normal appearance of the small bowel without dilatation. No gross abnormality in the colon. No evidence for acute bowel inflammation. Vascular/Lymphatic: New right inguinal lymphadenopathy. Right inguinal lymphadenopathy seen on image 90/3. Mildly enlarged right pelvic sidewall lymph node on  image 70/3 measures 0.9 cm in the short axis. Atherosclerotic disease in the abdominal aorta without aneurysm. Reproductive: Prostate is unremarkable. Other: Evidence for previous right inguinal hernia repair. Small new nodular densities with mild stranding in the left paracolic region on image 42/3 and image 44/3. Largest peritoneal nodule measures 1.0 cm. Findings are concerning for peritoneal disease in this area. There appears to be 2 small periumbilical hernias containing fat. Question a small peritoneal nodule in the right lower abdomen on image 62/3. Negative for free fluid. Negative for free air. Musculoskeletal: Disc space narrowing and degenerative changes in lower lumbar spine. No suspicious bone lesions. IMPRESSION: 1. Concern for neoplastic and metastatic disease. The liver is enlarged and diffusely heterogeneous. Concern for multiple liver lesions. In addition, there is new right inguinal lymphadenopathy.  Concern for small peritoneal nodules that could represent peritoneal metastatic disease. In addition, there are new small pulmonary nodules. Potentially, the liver and right inguinal region could be amendable for ultrasound-guided biopsy. 2. Small ventral hernias containing fat. 3. Question nephrolithiasis without hydronephrosis. These results were called by telephone at the time of interpretation on 05/13/2023 at 8:44 pm to provider [facility] , who verbally acknowledged these results. Electronically Signed   By: Richarda Overlie M.D.   On: 05/13/2023 20:47   DG Chest Portable 1 View Result Date: 05/13/2023 CLINICAL DATA:  poorly responsive poorly responsive and abnormal lab. Hx of HTN and former smoker. EXAM: PORTABLE CHEST 1 VIEW COMPARISON:  Chest x-ray 02/04/2022, CT chest 02/04/2022 FINDINGS: The heart and mediastinal contours are unchanged. Atherosclerotic plaque. Chronic coarsened markings. No focal consolidation. No pulmonary edema. No pleural effusion. No pneumothorax. No acute osseous  abnormality. IMPRESSION: 1. No active disease. 2.  Aortic Atherosclerosis (ICD10-I70.0). Electronically Signed   By: Tish Frederickson M.D.   On: 05/13/2023 20:32   CT HEAD WO CONTRAST ( ) Result Date: 05/13/2023 CLINICAL DATA:  poor responsiveness, eval ich, cva EXAM: CT HEAD WITHOUT CONTRAST TECHNIQUE: Contiguous axial images were obtained from the base of the skull through the vertex without intravenous contrast. RADIATION DOSE REDUCTION: This exam was performed according to the departmental dose-optimization program which includes automated exposure control, adjustment of the mA and/or kV according to patient size and/or use of iterative reconstruction technique. COMPARISON:  CT head 02/04/2022. FINDINGS: Brain: No evidence of acute infarction, hemorrhage, hydrocephalus, extra-axial collection or mass lesion/mass effect. Vascular: No hyperdense vessel. Skull: No acute fracture. Sinuses/Orbits: Clear sinuses.  No acute orbital findings. Other: No mastoid effusions. IMPRESSION: No evidence of acute intracranial abnormality. Electronically Signed   By: Feliberto Harts M.D.   On: 05/13/2023 20:23    PERFORMANCE STATUS (ECOG) : 3 - Symptomatic, >50% confined to bed  Review of Systems Unable to complete  Physical Exam General: Thin, frail appearing Pulmonary: Unlabored Extremities: no edema, no joint deformities Skin: no rashes Neurological: Weakness, confusion  IMPRESSION: Weekend notes reviewed.  Patient was evaluated over the weekend for possible discharge to hospice IPU but was not felt to be a candidate for inpatient hospice.  I called and spoke with patient's brother, [name].  [name] confirms that he family has not completely decided to forego option for cancer treatment.  PSA is not substantially elevated and so prostate cancer is less likely.  Will await final pathology but patient seems unlikely to be a candidate for systemic treatments.  I do think that family would agree with  hospice care if patient is not felt to be a candidate for cancer treatment.  I spoke with hospice liaison who also plans to follow-up.  PLAN: -Continue current scope of treatment -Await results of final pathology  Case and plan discussed with Dr. Smith Robert   Time Total: 25 minutes  Visit consisted of counseling and education dealing with the complex and emotionally intense issues of symptom management and palliative care in the setting of serious and potentially life-threatening illness.Greater than 50%  of this time was spent counseling and coordinating care related to the above assessment and plan.  Signed by: Laurette Schimke, PhD, NP-C

## 2023-05-18 NOTE — Evaluation (Signed)
 Clinical/Bedside Swallow Evaluation Patient Details  Name: Jeremy [name] MRN: [id number] Date of Birth: Dec 15, 1945  Today's Date: 05/18/2023 Time: SLP Start Time (ACUTE ONLY): 1330 SLP Stop Time (ACUTE ONLY): 1430 SLP Time Calculation (min) (ACUTE ONLY): 60 min  Past Medical History:  Past Medical History:  Diagnosis Date   Anemia    CAD (coronary artery disease)    Cancer (HCC) 2019   prostate /radiation   Chronic combined systolic and diastolic CHF (congestive heart failure) (HCC)    Dementia (HCC)    HLD (hyperlipidemia)    Hypertension    Prostate cancer (HCC)    Seizures (HCC)    last seizure 2020   Past Surgical History:  Past Surgical History:  Procedure Laterality Date   FLEXOR TENDON REPAIR Right 02/12/2021   Procedure: Right hand contracture release - Flexor release, pinning of PIP joints;  Surgeon: Kennedy Bucker, MD;  Location: [facility] ORS;  Service: Orthopedics;  Laterality: Right;   PROSTATE BIOPSY N/A 12/15/2017   Procedure: BIOPSY TRANSRECTAL ULTRASONIC PROSTATE (TUBP);  Surgeon: Riki Altes, MD;  Location: [facility] ORS;  Service: Urology;  Laterality: N/A;   ROBOT ASSISTED INGUINAL HERNIA REPAIR Right 09/02/2018   Procedure: ROBOT ASSISTED INGUINAL HERNIA REPAIR AND UMBILICAL HERNIA, RIGHT;  Surgeon: Leafy Ro, MD;  Location: [facility] ORS;  Service: General;  Laterality: Right;   HPI:  Pt is a 78yo with h/o prostate CA, chronic systolic CHF, HTN, HLD, CAD, Dementia, seizure d/o, stage 3B CKD, and cocaine abuse who presented on 3/26 with weight loss. Evaluation in the ER with imaging suggestive of widely metastatic cancer, uncertain primary. Oncology consulting.  Patient presented with FTT from group home in the setting of known Dementia.   CT scan showed Metastasized disease to liver, peritoneum, with new right inguinal lymphadenopathy.   CXR at admit: No active disease.    Assessment / Plan / Recommendation  Clinical Impression   Pt seen for BSE today. Pt  resting in bed; Nonverbal during session and did not follow instructions consistently. Has Baseline Dementia. Noted LOOSE Anterior Dentition during OM exam. Decreased Oral Prep awareness initially until given MOD cues and FULL feeding assistance.  On RA, afebrile. WBC WNL.  Pt appears to present w/ oral phase Dysphagia in setting of declined Cognitive status; Baseline Dementia AND Poor, LOOSE Dentition. No overt s/s of pharyngeal phase swallowing deficits noted w/ trials given. ANY Cognitive decline can impact overall awareness/timing of swallow and safety during po tasks which increases risk for aspiration, choking. Cognitive decline can impact Oral Prep bolus acceptance also.  Pt's risk for aspiration can be reduced and oral intake supported when following general aspiration precautions and using a modified diet consistency of PUREED foods d/t POOR Dentition status at baseline and overall Cognitive decline w/ need for MOD+ verbal/visual/tactile cues for follow through.        Pt consumed several trials of ice chips, purees, soft solid(1 trial), and thin liquids(~12 ozs) via straw(pinched at times when needed) w/ No overt clinical s/s of aspiration noted: no cough or throat clearing and no decline in respiratory status during/post trials. O2 sats 99% during. Oral phase was grossly adequate for bolus management and oral clearing of the liquid and puree boluses given. Significant oral phase bolus management issues noted w/ the increased textured solid food trial c/b lengthy mastication effort/attempt and oral residue post multiple swallows and alternating food/liquids.  OM Exam was cursory d/t Cognitive decline but appeared to reveal No unilateral weakness. Confusion of  OM tasks and oral care noted. FULL FEEDING ASSISTANCE REQUIRED.         In setting of baseline Dementia/Cognitive decline and POOR Dentition status resulting in Oral Phase Dysphagia, recommend initiation of the dysphagia level 1(PUREE foods)  moistened for ease of oral phase w/ thin liquids. General aspiration precautions- reduce Distractions during meals and engage pt in self-feeding. FULL Feeding Assistance as needed. Check for oral clearing during/post meals. Pills Crushed in Puree for safer swallowing as needed. MD/NSG updated.  ST services recommends follow w/ Palliative Care for GOC and education re: impact of Cognitive decline/Dementia on swallowing and oral intake in general. Suspect pt is close to/at his baseline. Precautions posted in room, chart.  No further skilled ST services indicated at this time. F/u at next venue of care can be had if needs indicate. SLP Visit Diagnosis: Dysphagia, oral phase (R13.11) (Cognitive decline/Dementia; poor, LOOSE dentition; dependency on feeding)    Aspiration Risk  Mild aspiration risk;Risk for inadequate nutrition/hydration    Diet Recommendation   Thin;Dysphagia 1 (puree) = ysphagia level 1(PUREE foods) moistened for ease of oral phase w/ thin liquids. General aspiration precautions- reduce Distractions during meals and engage pt in self-feeding. FULL Feeding Assistance as needed. Check for oral clearing during/post meals.   Medication Administration: Crushed with puree    Other  Recommendations Recommended Consults:  (Dietician; Palliative Care for GOC) Oral Care Recommendations: Oral care BID;Oral care before and after PO;Staff/trained caregiver to provide oral care    Recommendations for follow up therapy are one component of a multi-disciplinary discharge planning process, led by the attending physician.  Recommendations may be updated based on patient status, additional functional criteria and insurance authorization.  Follow up Recommendations No SLP follow up      Assistance Recommended at Discharge  FULL at meals  Functional Status Assessment Patient has had a recent decline in their functional status and/or demonstrates limited ability to make significant improvements in  function in a reasonable and predictable amount of time  Frequency and Duration  (n/a)   (n/a)       Prognosis Prognosis for improved oropharyngeal function: Fair Barriers to Reach Goals: Cognitive deficits;Language deficits;Time post onset;Severity of deficits Barriers/Prognosis Comment: Cognitive decline/Dementia; poor, LOOSE dentition; dependency on feeding      Swallow Study   General Date of Onset: 05/13/23 HPI: Pt is a 78yo with h/o prostate CA, chronic systolic CHF, HTN, HLD, CAD, Dementia, seizure d/o, stage 3B CKD, and cocaine abuse who presented on 3/26 with weight loss. Evaluation in the ER with imaging suggestive of widely metastatic cancer, uncertain primary. Oncology consulting.  Patient presented with FTT from group home in the setting of known Dementia.   CT scan showed Metastasized disease to liver, peritoneum, with new right inguinal lymphadenopathy.   CXR at admit: No active disease. Type of Study: Bedside Swallow Evaluation Previous Swallow Assessment: none Diet Prior to this Study: Regular;Thin liquids (Level 0) Temperature Spikes Noted: No (wbc 8.6) Respiratory Status: Room air History of Recent Intubation: No Behavior/Cognition: Alert;Cooperative;Pleasant mood;Confused;Distractible;Requires cueing (Dementia) Oral Cavity Assessment: Within Functional Limits Oral Care Completed by SLP: Yes Oral Cavity - Dentition: Poor condition;Missing dentition (LOOSE TEETH) Vision:  (n/a) Self-Feeding Abilities: Total assist Patient Positioning: Upright in bed (max assist) Baseline Vocal Quality:  (nonverbal) Volitional Cough: Cognitively unable to elicit Volitional Swallow: Unable to elicit    Oral/Motor/Sensory Function Overall Oral Motor/Sensory Function:  (no unilateral weakness during bolus management; decreased awareness)   Ice Chips Ice chips: Within  functional limits Presentation: Spoon (fed; 2 trials)   Thin Liquid Thin Liquid: Within functional  limits Presentation: Straw (fed and monitored bolus size: ~12 ozs total) Other Comments: oral prep deficits initially to accept/suck on straw until cues given    Nectar Thick Nectar Thick Liquid: Not tested   Honey Thick Honey Thick Liquid: Not tested   Puree Puree: Within functional limits Presentation: Spoon (fed; ~8 ozs total) Other Comments: min lengthy oral phase time noted 2-3x but not consistent   Solid     Solid: Impaired (soft, moistened bolus) Presentation: Spoon (fed; 1 trial) Oral Phase Impairments: Poor awareness of bolus;Impaired mastication Oral Phase Functional Implications: Prolonged oral transit;Impaired mastication;Oral residue Pharyngeal Phase Impairments:  (none) Other Comments: LOOSE TEETH         Jerilynn Som, MS, CCC-SLP Speech Language Pathologist Rehab Services; [facility] [phone] (ascom) Chelsa Stout 05/18/2023,4:39 PM

## 2023-05-18 NOTE — Progress Notes (Signed)
 Mobility Specialist - Progress Note   05/18/23 1600  Mobility  Activity Ambulated with assistance in room  Level of Assistance Minimal assist, patient does 75% or more  Assistive Device Front wheel walker  Distance Ambulated (ft) 35 ft  Activity Response Tolerated well  Mobility visit 1 Mobility     Pt lying in bed upon arrival, utilizing RA. Pt agreeable to activity. Completed bed mobility, STS, and ambulation with minA. Pt required persistent RW negotiation to avoid obstacles and for continuation of activity. Poor safety awareness; pt looking deeply off to side instead of what's in front of him despite cueing. VC for sequencing. Pt returned to bed with alarm set, needs in reach.    Filiberto Pinks Mobility Specialist 05/18/23, 4:27 PM

## 2023-05-18 NOTE — Progress Notes (Signed)
 Progress Note   Patient: Jeremy [name]:[id number] DOB: [birth year] DOA: 05/13/2023     5 DOS: the patient was seen and examined on 05/18/2023   Brief hospital course: 78yo with h/o prostate CA, chronic systolic CHF, HTN, HLD, CAD, dementia, seizure d/o, stage 3B CKD, and cocaine abuse who presented on 3/26 with weight loss. Evaluation in the ER with imaging suggestive of widely metastatic cancer, uncertain primary. Oncology consulting.   Assessment and Plan:  Metastatic cancer Patient presented with FTT from group home in the setting of known dementia CT scan showed metastasized disease to liver, peritoneum, with new right inguinal lymphadenopathy Unclear primary cancer, although he has a history of prostate cancer Oncology is consulting  PSA 0.24, AFP 2.4 Multiple myeloma and Kappa/lambda light chains pending Palliative consulting He is continuing to deteriorate clinically and appears to be residential hospice-appropriate I have discussed this with his brother, who is in agreement Hospice liaison notified and is following   History of prostate cancer Diagnosed in 2019, completed all treatment by 2022 S/p of radiation therapy and prostatectomy PSA has remained normal so recurrence seems less likely Continue Flomax   Acute kidney injury superimposed on stage 3b chronic kidney disease  Likely due to dehydration and continuation of losartan Baseline creatinine 1.81/GFR 38; 3.24/19 on presentation and continues to improve, currently 2.02/33 No hydronephrosis per CT scan Hold Cozaar Avoid using renal toxic medications   Normocytic anemia Hemoglobin 7.7 on presentation (8.5 on 03/28/2023); dropped to 6.5 today No rectal bleeding or dark stool Anemia panel with low iron and folate, elevated ferritin Brother provided consent for blood, will transfuse 2 units PRBC Hgb improved and stable at this time but slightly lower than yesterday   CAD (coronary artery disease) Hold  aspirin due to worsening anemia Continue rosuvastatin   Chronic combined systolic and diastolic CHF (congestive heart failure Echo on 02/05/2022 showed EF of 40-45% with grade 1 diastolic dysfunction CHF seems to be compensated Watch volume status closely   Dementia without behavioral disturbance Fall precautions Delirium precautions Downgraded to pureed diet today due to cognitive status and lack of teeth   DNR I discussed code status with the brother and the patient would not desire resuscitation and would prefer to die a natural death should that situation arise Code status changed Patient will need a gold out of facility DNR form at the time of discharge     Disposition Patient lives in a group home and will need to be at close to baseline level of function for dc Per discussion with [name] and patient's brother, his baseline is ambulatory about 50-100 feet and conversant PT consulted He does not appear to be at baseline currently and has deteriorated daily He is frail and cachectic and given his metastatic malignancy, he appears unlikely to survive at this time Hospice consulted, suggest transition to comfort care and transfer to residential hospice     Consultants: Oncology IR Palliative care   Procedures: IR liver mass biopsy 3/27   Antibiotics: None    30 Day Unplanned Readmission Risk Score    Flowsheet Row ED to Hosp-Admission (Current) from [facility]13/20[facility]  30 Day Unplanned Readmission Risk Score (%) 37.44 Filed at 05/18/2023 0801       This score is the patient's risk of an unplanned readmission within 30 days of being discharged (0 -100%). The score is based on dignosis, age, lab data, medications, orders, and past utilization.   Low:  0-14.9   Medium: 15-21.9   High: 22-29.9   Extreme: 30 and above           Subjective: Minimally conversant.   Objective: Vitals:   05/18/23 0533 05/18/23 0810   BP: (!) 150/88 (!) 150/65  Pulse: (!) 101 87  Resp: 17 18  Temp: 99.5 F (37.5 C) 98.7 F (37.1 C)  SpO2: 100% 100%    Intake/Output Summary (Last 24 hours) at 05/18/2023 1610 Last data filed at 05/17/2023 2158 Gross per 24 hour  Intake 0 ml  Output --  Net 0 ml   Filed Weights   05/15/23 0500 05/16/23 0500 05/17/23 0500  Weight: 60.7 kg 58.8 kg 58.3 kg    Exam:  General:  Appears frail, chronically ill, cachectic Eyes:   normal lids, iris ENT:  grossly normal hearing, lips & tongue, mmm; poor/absent dentition Cardiovascular:  RRR, no m/r/g. No LE edema.  Respiratory:   CTA bilaterally with no wheezes/rales/rhonchi.  Normal respiratory effort. Abdomen:  soft, NT, distention noted Skin:  no rash or induration seen on limited exam Musculoskeletal:  grossly normal tone BUE/BLE, good ROM, no bony abnormality Psychiatric:  blunted mood and affect, speech sparse, AOx1-2 Neurologic:  unable to effectively perform, strength appears symmetric  Data Reviewed: I have reviewed the patient's lab results since admission.  Pertinent labs for today include:   None  Family Communication: None present; I spoke with his brother by telephone  Disposition: Status is: Inpatient Remains inpatient appropriate because: awaiting placement     Time spent: 35 minutes  Unresulted Labs (From admission, onward)     Start     Ordered   05/15/23 0500  Multiple Myeloma Panel (SPEP&IFE w/QIG)  Tomorrow morning,   R        05/14/23 1804   05/15/23 0500  Kappa/lambda light chains  Tomorrow morning,   R        05/14/23 1804             Author: Jonah Blue, MD 05/18/2023 8:11 AM  For on call review [email or web address].

## 2023-05-19 DIAGNOSIS — C801 Malignant (primary) neoplasm, unspecified: Secondary | ICD-10-CM | POA: Diagnosis not present

## 2023-05-19 LAB — BASIC METABOLIC PANEL WITH GFR
Anion gap: 10 (ref 5–15)
BUN: 23 mg/dL (ref 8–23)
CO2: 21 mmol/L — ABNORMAL LOW (ref 22–32)
Calcium: 9 mg/dL (ref 8.9–10.3)
Chloride: 112 mmol/L — ABNORMAL HIGH (ref 98–111)
Creatinine, Ser: 1.76 mg/dL — ABNORMAL HIGH (ref 0.61–1.24)
GFR, Estimated: 39 mL/min — ABNORMAL LOW (ref >60)
Glucose, Bld: 109 mg/dL — ABNORMAL HIGH (ref 70–99)
Potassium: 3.4 mmol/L — ABNORMAL LOW (ref 3.5–5.1)
Sodium: 143 mmol/L (ref 135–145)

## 2023-05-19 LAB — CBC WITH DIFFERENTIAL/PLATELET
Abs Immature Granulocytes: 0.11 10*3/uL — ABNORMAL HIGH (ref 0.00–0.07)
Basophils Absolute: 0 10*3/uL (ref 0.0–0.1)
Basophils Relative: 0 %
Eosinophils Absolute: 0.2 10*3/uL (ref 0.0–0.5)
Eosinophils Relative: 2 %
HCT: 26 % — ABNORMAL LOW (ref 39.0–52.0)
Hemoglobin: 8.7 g/dL — ABNORMAL LOW (ref 13.0–17.0)
Immature Granulocytes: 1 %
Lymphocytes Relative: 14 %
Lymphs Abs: 1.5 10*3/uL (ref 0.7–4.0)
MCH: 28.4 pg (ref 26.0–34.0)
MCHC: 33.5 g/dL (ref 30.0–36.0)
MCV: 85 fL (ref 80.0–100.0)
Monocytes Absolute: 0.8 10*3/uL (ref 0.1–1.0)
Monocytes Relative: 8 %
Neutro Abs: 7.6 10*3/uL (ref 1.7–7.7)
Neutrophils Relative %: 75 %
Platelets: 202 10*3/uL (ref 150–400)
RBC: 3.06 MIL/uL — ABNORMAL LOW (ref 4.22–5.81)
RDW: 18.5 % — ABNORMAL HIGH (ref 11.5–15.5)
WBC: 10.3 10*3/uL (ref 4.0–10.5)
nRBC: 0 % (ref 0.0–0.2)

## 2023-05-19 LAB — SURGICAL PATHOLOGY

## 2023-05-19 NOTE — Progress Notes (Signed)
 Progress Note   Patient: Jeremy [name]:[id number] DOB: 06-Nov-1945 DOA: 05/13/2023     6 DOS: the patient was seen and examined on 05/19/2023   Brief hospital course: 78yo with h/o prostate CA, chronic systolic CHF, HTN, HLD, CAD, dementia, seizure d/o, stage 3B CKD, and cocaine abuse who presented on 3/26 with weight loss. Evaluation in the ER with imaging suggestive of widely metastatic cancer, uncertain primary. Oncology consulting.   Assessment and Plan:  Metastatic cancer Patient presented with FTT from group home in the setting of known dementia CT scan showed metastasized disease to liver, peritoneum, with new right inguinal lymphadenopathy Liver biopsy with metastatic malignant melanoma Oncology is consulting hospice recommended  Palliative consulting He is continuing to deteriorate clinically but is not yet residential hospice-appropriate Plan will be for SNF with hospice unless he worsens enough soon to qualify for residential hospice   History of prostate cancer Diagnosed in 2019, completed all treatment by 2022 S/p of radiation therapy and prostatectomy PSA has remained normal so recurrence seems less likely Continue Flomax   Acute kidney injury superimposed on stage 3b chronic kidney disease  Likely due to dehydration and continuation of losartan Baseline creatinine 1.81/GFR 38; 3.24/19 on presentation and continues to improve, now at baseline No hydronephrosis per CT scan Hold Cozaar Avoid using renal toxic medications   Normocytic anemia Hemoglobin 7.7 on presentation (8.5 on 03/28/2023); dropped to 6.5 and transfused 2 units PRBC Hgb improved and stable   CAD (coronary artery disease) Hold aspirin due to worsening anemia Continue rosuvastatin   Chronic combined systolic and diastolic CHF (congestive heart failure Echo on 02/05/2022 showed EF of 40-45% with grade 1 diastolic dysfunction CHF seems to be compensated   Dementia without behavioral  disturbance Fall precautions Delirium precautions Downgraded to pureed diet due to cognitive status and lack of teeth Ate virtually nothing this AM   DNR I discussed code status with the brother and the patient would not desire resuscitation and would prefer to die a natural death should that situation arise Code status changed Patient will need a gold out of facility DNR form at the time of discharge     Disposition Patient lives in a group home and would need to be at close to baseline level of function for dc Per discussion with [name] and patient's brother, his baseline is ambulatory about 50-100 feet and conversant PT consulted He does not appear to be at baseline currently and has deteriorated daily He is frail and cachectic and given his metastatic malignancy, he appears unlikely to survive at this time Hospice consulted, suggest transition to comfort care and transfer to residential hospice If not residential hospice, he is likely to remain hospitalized unless he is able to be placed in SNF with hospice     Consultants: Oncology IR Palliative care   Procedures: IR liver mass biopsy 3/27   Antibiotics: None  30 Day Unplanned Readmission Risk Score    Flowsheet Row ED to Hosp-Admission (Current) from 05/13/2023 in [facility]  30 Day Unplanned Readmission Risk Score (%) 30.88 Filed at 05/19/2023 0802       This score is the patient's risk of an unplanned readmission within 30 days of being discharged (0 -100%). The score is based on dignosis, age, lab data, medications, orders, and past utilization.   Low:  0-14.9   Medium: 15-21.9   High: 22-29.9   Extreme: 30 and above  Subjective: Less interactive today, necrotic odor.   Objective: Vitals:   05/19/23 0325 05/19/23 0729  BP: (!) 127/58 116/62  Pulse: 82 78  Resp: 16 16  Temp: 98.5 F (36.9 C) 98 F (36.7 C)  SpO2: 96% 97%    Intake/Output Summary  (Last 24 hours) at 05/19/2023 1421 Last data filed at 05/19/2023 1320 Gross per 24 hour  Intake 0 ml  Output 775 ml  Net -775 ml   Filed Weights   05/15/23 0500 05/16/23 0500 05/17/23 0500  Weight: 60.7 kg 58.8 kg 58.3 kg    Exam:  General:  Appears frail, chronically ill, cachectic Eyes:   normal lids, iris ENT:  grossly normal hearing, lips & tongue, mmm; poor/absent dentition Cardiovascular:  RRR, no m/r/g. No LE edema.  Respiratory:   CTA bilaterally with no wheezes/rales/rhonchi.  Normal respiratory effort. Abdomen:  soft, NT, distention noted Skin:  no rash or induration seen on limited exam Musculoskeletal:  grossly normal tone BUE/BLE, good ROM, no bony abnormality Psychiatric:  blunted mood and affect, speech sparse, AOx1-2 Neurologic:  unable to effectively perform, strength appears symmetric  Data Reviewed: I have reviewed the patient's lab results since admission.  Pertinent labs for today include:   K+ 3.4 Glucose 112 CO2 21, stable BUN 23/Creatinine 1.73/GFR 39, improving WBC 10.3 Hgb 8.7, stable    Family Communication: None present  Disposition: Status is: Inpatient Remains inpatient appropriate because: actively dying     Time spent: 35 minutes  Unresulted Labs (From admission, onward)    None        Author: Jonah Blue, MD 05/19/2023 2:21 PM  For on call review [email or web address].

## 2023-05-19 NOTE — TOC Progression Note (Signed)
 Transition of Care [facility]) - Progression Note    Patient Details  Name: Jeremy [name] MRN: [id number] Date of Birth: [birth year]  Transition of [facility] CM/SW Contact  Margarito Liner, LCSW Phone Number: 05/19/2023, 11:19 AM  Clinical Narrative:  CSW contacted group home owner about potential return to facility with hospice. Doristine Mango is concerned with patient's limited intake and therefore potential to gradually get weaker and inability to manage his needs. Doristine Mango would like for CSW to search for SNF to take him with hospice. CSW called brother [name] who is agreeable to this plan. CSW started search in [place]. Brother, [name], lives in [place].     Barriers to Discharge: Continued Medical Work up  Expected Discharge Plan and Services     Post Acute Care Choice: NA Living arrangements for the past 2 months: Group Home                                       Social Determinants of Health (SDOH) Interventions SDOH Screenings   Food Insecurity: Patient Unable To Answer (05/14/2023)  Housing: Patient Unable To Answer (05/14/2023)  Transportation Needs: Patient Unable To Answer (05/14/2023)  Utilities: Patient Unable To Answer (05/14/2023)  Financial Resource Strain: Low Risk  (10/31/2022)   Received from [facility]  Social Connections: Patient Unable To Answer (05/14/2023)  Tobacco Use: Medium Risk (05/14/2023)    Readmission Risk Interventions    05/15/2023   11:11 AM 02/06/2022    3:18 PM  Readmission Risk Prevention Plan  Transportation Screening  Complete  Medication Review (RN Care Manager)  Complete  PCP or Specialist appointment within 3-5 days of discharge Complete   SW Recovery Care/Counseling Consult Complete Complete  Palliative Care Screening Not Applicable Not Applicable  Skilled Nursing Facility Not Applicable

## 2023-05-19 NOTE — Plan of Care (Signed)

## 2023-05-19 NOTE — Progress Notes (Signed)
 [facility] Hospice Liaison Note  Hospice Liaison team will follow peripherally while a LTC bed search is initiated.  Hospice will follow patient at a LTC facility.  Please call with any hospice related questions or concerns  Thank you for the opportunity to participate in this patient's care  [facility] Liaison 336 [phone]

## 2023-05-19 NOTE — [place] FL2 (Signed)
 [place] MEDICAID FL2 LEVEL OF CARE FORM     IDENTIFICATION  Patient Name: Jeremy [name] Birthdate: 13-Jun-1945 Sex: male Admission Date (Current Location): 05/13/2023  [place] Number:  Chiropodist and Address:  [facility], [address] [place], [place] [address]      Provider Number: [id number]  Attending Physician Name and Address:  Jonah Blue, MD  Relative Name and Phone Number:       Current Level of Care: Hospital Recommended Level of Care: Skilled Nursing Facility (with hospice) Prior Approval Number:    Date Approved/Denied:   PASRR Number: [id number]  Discharge Plan: SNF (with hospice)    Current Diagnoses: Patient Active Problem List   Diagnosis Date Noted   Metastatic malignant neoplasm (HCC) 05/18/2023   Palliative care encounter 05/14/2023   Acute kidney injury superimposed on stage 3b chronic kidney disease (HCC) 05/13/2023   Metastasized cancer 05/13/2023   CAD (coronary artery disease) 05/13/2023   Dementia without behavioral disturbance (HCC) 05/13/2023   Leukocytosis 05/13/2023   Normocytic anemia    Chronic combined systolic and diastolic CHF (congestive heart failure) (HCC)    Acute encephalopathy 02/04/2022   Rhabdomyolysis 02/04/2022   Ventricular bigeminy 02/04/2022   NSTEMI (non-ST elevated myocardial infarction) (HCC) 02/04/2022   Stage 3b chronic kidney disease (CKD) (HCC) 02/04/2022   Acute confusion due to infection 09/16/2020   AKI (acute kidney injury) (HCC) 09/16/2020   COVID-19 virus infection 09/15/2020   Prostate cancer (HCC) 06/11/2018   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 History of seizures.) Incontinent Diet (DYS 1. Extra Gravies on meats, potatoes.  May have Oatmeal per Speech ok w/ butter/sugar.)  AMBULATORY STATUS COMMUNICATION OF NEEDS Skin   Limited Assist Verbally Normal                       Personal Care Assistance Level of Assistance              Functional Limitations Info  Sight, Hearing, Speech Sight Info: Adequate Hearing Info: Adequate Speech Info: Impaired (Incomprehensible)    SPECIAL CARE FACTORS FREQUENCY                       Contractures Contractures Info: Not present    Additional Factors Info  Code Status, Allergies Code Status Info: DNR Allergies Info: NKDA           Current Medications (05/19/2023):  This is the current hospital active medication list Current Facility-Administered Medications  Medication Dose Route Frequency Provider Last Rate Last Admin   acetaminophen (TYLENOL) tablet 650 mg  650 mg Oral Q6H PRN Lorretta Harp, MD       carvedilol (COREG) tablet 6.25 mg  6.25 mg Oral BID WC Lorretta Harp, MD   6.25 mg at 05/19/23 1050   feeding supplement (ENSURE ENLIVE / ENSURE PLUS) liquid 237 mL  237 mL Oral BID BM Jonah Blue, MD   237 mL at 05/19/23 1050   ferrous sulfate tablet 325 mg  325 mg Oral BID WC Lorretta Harp, MD   325 mg at 05/19/23 1049   folic acid (FOLVITE) tablet 1 mg  1 mg Oral Daily Creig Hines, MD   1 mg at 05/19/23 1049   hydrALAZINE (APRESOLINE) injection 5 mg  5 mg Intravenous Q2H PRN Lorretta Harp, MD       HYDROcodone-acetaminophen (NORCO/VICODIN) 5-325 MG per tablet 1-2 tablet  1-2 tablet Oral Q6H PRN Lorretta Harp, MD   1 tablet at 05/15/23 1006   lamoTRIgine (LAMICTAL) tablet 25 mg  25 mg Oral BID Lorretta Harp, MD   25 mg at 05/19/23 1049   levETIRAcetam (KEPPRA) tablet 500 mg  500 mg Oral BID Lorretta Harp, MD   500 mg at 05/19/23 1049    loperamide (IMODIUM) capsule 2 mg  2 mg Oral QID PRN Lorretta Harp, MD       LORazepam (ATIVAN) injection 2 mg  2 mg Intravenous Q2H PRN Lorretta Harp, MD       ondansetron [facility]) injection 4 mg  4 mg Intravenous Q8H PRN Lorretta Harp, MD       risperiDONE (RISPERDAL) tablet 1 mg  1 mg Oral TID Lorretta Harp, MD   1 mg at 05/19/23 1050   rosuvastatin (CRESTOR) tablet 10 mg  10 mg Oral Daily Lorretta Harp, MD   10 mg at 05/19/23 1050   tamsulosin (FLOMAX) capsule 0.4 mg  0.4 mg Oral QPC supper Lorretta Harp, MD   0.4 mg at 05/18/23 1827     Discharge Medications: Please see discharge summary for a list of discharge medications.  Relevant Imaging Results:  Relevant Lab Results:   Additional Information SS#: [id number]  Margarito Liner, LCSW

## 2023-05-19 NOTE — Progress Notes (Signed)
 Mobility Specialist - Progress Note   05/19/23 1100  Mobility  Activity Refused mobility     2nd attempt this date. Pt sleeping on arrival, utilizing RA. Briefly awakens to voice but unable to keep eyes open. Wet wash cloth provided to face for increased stimulation. Attempted to assist pt with breakfast, but pt states "not right now" and returns to sleep. Will attempt another date/time.   Filiberto Pinks Mobility Specialist 05/19/23, 11:40 AM

## 2023-05-20 DIAGNOSIS — C801 Malignant (primary) neoplasm, unspecified: Secondary | ICD-10-CM | POA: Diagnosis not present

## 2023-05-20 MED ORDER — ALBUTEROL SULFATE (2.5 MG/3ML) 0.083% IN NEBU: 2.5000 mg | INHALATION_SOLUTION | RESPIRATORY_TRACT | Status: DC | PRN

## 2023-05-20 MED ORDER — DIPHENHYDRAMINE HCL 50 MG/ML IJ SOLN: 12.5000 mg | INTRAMUSCULAR | Status: DC | PRN

## 2023-05-20 MED ORDER — GLYCOPYRROLATE 0.2 MG/ML IJ SOLN: 0.2000 mg | INTRAMUSCULAR | Status: DC | PRN

## 2023-05-20 MED ORDER — LORAZEPAM 2 MG/ML PO CONC
1.0000 mg | ORAL | Status: DC | PRN
  Filled 2023-05-20: qty 0.5

## 2023-05-20 MED ORDER — NYSTATIN 100000 UNIT/GM EX POWD: Freq: Three times a day (TID) | CUTANEOUS | Status: DC | PRN

## 2023-05-20 MED ORDER — GLYCOPYRROLATE 1 MG PO TABS: 1.0000 mg | ORAL_TABLET | ORAL | Status: DC | PRN

## 2023-05-20 MED ORDER — ONDANSETRON 4 MG PO TBDP: 4.0000 mg | ORAL_TABLET | Freq: Four times a day (QID) | ORAL | Status: DC | PRN

## 2023-05-20 MED ORDER — LORAZEPAM 1 MG PO TABS: 1.0000 mg | ORAL_TABLET | ORAL | Status: DC | PRN

## 2023-05-20 MED ORDER — ACETAMINOPHEN 325 MG PO TABS: 650.0000 mg | ORAL_TABLET | Freq: Four times a day (QID) | ORAL | Status: DC | PRN

## 2023-05-20 MED ORDER — LORAZEPAM 2 MG/ML IJ SOLN
1.0000 mg | INTRAMUSCULAR | Status: DC | PRN
  Administered 2023-05-21: 1 mg via INTRAVENOUS
  Filled 2023-05-20: qty 1

## 2023-05-20 MED ORDER — ONDANSETRON HCL 4 MG/2ML IJ SOLN: 4.0000 mg | Freq: Four times a day (QID) | INTRAMUSCULAR | Status: DC | PRN

## 2023-05-20 MED ORDER — OXYBUTYNIN CHLORIDE 5 MG PO TABS: 2.5000 mg | ORAL_TABLET | Freq: Four times a day (QID) | ORAL | Status: DC | PRN

## 2023-05-20 MED ORDER — ACETAMINOPHEN 650 MG RE SUPP: 650.0000 mg | Freq: Four times a day (QID) | RECTAL | Status: DC | PRN

## 2023-05-20 MED ORDER — BISACODYL 10 MG RE SUPP
10.0000 mg | Freq: Every day | RECTAL | Status: DC | PRN
Start: 2023-05-20 — End: 2023-05-21

## 2023-05-20 MED ORDER — ALUM & MAG HYDROXIDE-SIMETH 200-200-20 MG/5ML PO SUSP: 30.0000 mL | Freq: Four times a day (QID) | ORAL | Status: DC | PRN

## 2023-05-20 MED ORDER — MAGIC MOUTHWASH: 15.0000 mL | Freq: Four times a day (QID) | ORAL | Status: DC | PRN

## 2023-05-20 MED ORDER — POLYVINYL ALCOHOL 1.4 % OP SOLN: 1.0000 [drp] | Freq: Four times a day (QID) | OPHTHALMIC | Status: DC | PRN

## 2023-05-20 MED ORDER — BIOTENE DRY MOUTH MT LIQD: 15.0000 mL | OROMUCOSAL | Status: DC | PRN

## 2023-05-20 MED ORDER — RISPERIDONE 1 MG PO TBDP
0.5000 mg | ORAL_TABLET | Freq: Two times a day (BID) | ORAL | Status: DC
  Administered 2023-05-20 – 2023-05-21 (×3): 0.5 mg via ORAL
  Filled 2023-05-20 (×4): qty 0.5

## 2023-05-20 NOTE — Progress Notes (Signed)
 [facility] Hospice Liaison Note  Hospice Liaison team will follow peripherally while a LTC bed search is initiated.  Hospice will follow patient at a LTC facility.  Please call with any hospice related questions or concerns  Thank you for the opportunity to participate in this patient's care  [facility] Liaison 336 [phone]

## 2023-05-20 NOTE — Plan of Care (Signed)
  Problem: Pain Managment: Goal: General experience of comfort will improve and/or be controlled Outcome: Progressing   Problem: Safety: Goal: Ability to remain free from injury will improve Outcome: Progressing

## 2023-05-20 NOTE — Consult Note (Signed)
 [facility] Liaison Note  05/20/2023  Jeremy [name] June 03, 1945 [id number]  Location: RN Hospital Liaison screened the patient remotely at [facility].  Insurance: [facility] HMO   Jeremy [name] is a 78 y.o. male who is a Primary Care Patient of [place], Duane Lope, MD [facility].The patient was screened for readmission hospitalization with noted extreme risk score for unplanned readmission risk with 1 IP/1 ED in 6 months.  The patient was assessed for potential Care Management service needs for post hospital transition for care coordination. Review of patient's electronic medical record reveals patient was admitted for Metastasized Cancer. Pt will return to group home or SNF (hospice). Facility will continue to address his ongoing needs.  Plan: [facility] Liaison will continue to follow progress and disposition to asess for post hospital community care coordination/management needs.  Referral request for community care coordination: pending disposition.   VBCI Care Management/Population Health does not replace or interfere with any arrangements made by the Inpatient Transition of Care team.   For questions contact:   [name], RN, BSN Hospital Liaison [place]   [facility], Population Health Office Hours MTWF  8:00 am-6:00 pm Direct Dial: [phone] mobile [email or web address]

## 2023-05-20 NOTE — TOC Progression Note (Signed)
 Transition of Care [facility]) - Progression Note    Patient Details  Name: Jeremy [name] MRN: [id number] Date of Birth: [birth year]  Transition of Care [facility]) CM/SW Contact  Chapman Fitch, RN Phone Number: 05/20/2023, 2:29 PM  Clinical Narrative:     [name] at [facility] confirms they can offer a bed with hospice.   Brother [name] accepts bed [name] with [employer], contracting notified  [facility] to reached out to [facility] to discuss SSI     Barriers to Discharge: Continued Medical Work up  Expected Discharge Plan and Services     Post Acute Care Choice: NA Living arrangements for the past 2 months: Group Home                                       Social Determinants of Health (SDOH) Interventions SDOH Screenings   Food Insecurity: Patient Unable To Answer (05/14/2023)  Housing: Patient Unable To Answer (05/14/2023)  Transportation Needs: Patient Unable To Answer (05/14/2023)  Utilities: Patient Unable To Answer (05/14/2023)  Financial Resource Strain: Low Risk  (10/31/2022)   Received from [facility]  Social Connections: Patient Unable To Answer (05/14/2023)  Tobacco Use: Medium Risk (05/14/2023)    Readmission Risk Interventions    05/15/2023   11:11 AM 02/06/2022    3:18 PM  Readmission Risk Prevention Plan  Transportation Screening  Complete  Medication Review (RN Care Manager)  Complete  PCP or Specialist appointment within 3-5 days of discharge Complete   SW Recovery Care/Counseling Consult Complete Complete  Palliative Care Screening Not Applicable Not Applicable  Skilled Nursing Facility Not Applicable

## 2023-05-20 NOTE — TOC Progression Note (Signed)
 Transition of Care [facility]) - Progression Note    Patient Details  Name: Jeremy [name] MRN: [id number] Date of Birth: 25-Oct-1945  Transition of Care [facility]) CM/SW Contact  Chapman Fitch, RN Phone Number: 05/20/2023, 10:17 AM  Clinical Narrative:      2 bed offers noted in the HUB.  [facility].  Spoke with Pamelia Hoit at [facility] they are unable to offer a bed with hospice.   Awaiting confirmation from [facility] that they are able to offer  Spoke with Youlanda Roys and updated    Barriers to Discharge: Continued Medical Work up  Expected Discharge Plan and Services     Post Acute Care Choice: NA Living arrangements for the past 2 months: Group Home                                       Social Determinants of Health (SDOH) Interventions SDOH Screenings   Food Insecurity: Patient Unable To Answer (05/14/2023)  Housing: Patient Unable To Answer (05/14/2023)  Transportation Needs: Patient Unable To Answer (05/14/2023)  Utilities: Patient Unable To Answer (05/14/2023)  Financial Resource Strain: Low Risk  (10/31/2022)   Received from [facility]  Social Connections: Patient Unable To Answer (05/14/2023)  Tobacco Use: Medium Risk (05/14/2023)    Readmission Risk Interventions    05/15/2023   11:11 AM 02/06/2022    3:18 PM  Readmission Risk Prevention Plan  Transportation Screening  Complete  Medication Review (RN Care Manager)  Complete  PCP or Specialist appointment within 3-5 days of discharge Complete   SW Recovery Care/Counseling Consult Complete Complete  Palliative Care Screening Not Applicable Not Applicable  Skilled Nursing Facility Not Applicable

## 2023-05-20 NOTE — Plan of Care (Signed)
   Problem: Coping: Goal: Level of anxiety will decrease Outcome: Progressing   Problem: Pain Managment: Goal: General experience of comfort will improve and/or be controlled Outcome: Progressing

## 2023-05-20 NOTE — Progress Notes (Signed)
 Hematology/Oncology Consult note [facility]  [phone] [phone]  Patient Care Team: Marguarite Arbour, MD as PCP - General (Internal Medicine) Carmina Miller, MD as Radiation Oncologist (Radiation Oncology)   Name of the patient: Jeremy [name]  [id number]  10/30/1945   Date of visit: 05/19/23   Interval history- appears comfortable  ECOG PS- 3 Pain scale- 0   Review of systems- Review of Systems  Unable to perform ROS: Dementia      No Known Allergies   Past Medical History:  Diagnosis Date   Anemia    CAD (coronary artery disease)    Cancer (HCC) 2019   prostate /radiation   Chronic combined systolic and diastolic CHF (congestive heart failure) (HCC)    Dementia (HCC)    HLD (hyperlipidemia)    Hypertension    Prostate cancer (HCC)    Seizures (HCC)    last seizure 2020     Past Surgical History:  Procedure Laterality Date   FLEXOR TENDON REPAIR Right 02/12/2021   Procedure: Right hand contracture release - Flexor release, pinning of PIP joints;  Surgeon: Kennedy Bucker, MD;  Location: [facility] ORS;  Service: Orthopedics;  Laterality: Right;   PROSTATE BIOPSY N/A 12/15/2017   Procedure: BIOPSY TRANSRECTAL ULTRASONIC PROSTATE (TUBP);  Surgeon: Riki Altes, MD;  Location: [facility] ORS;  Service: Urology;  Laterality: N/A;   ROBOT ASSISTED INGUINAL HERNIA REPAIR Right 09/02/2018   Procedure: ROBOT ASSISTED INGUINAL HERNIA REPAIR AND UMBILICAL HERNIA, RIGHT;  Surgeon: Leafy Ro, MD;  Location: [facility] ORS;  Service: General;  Laterality: Right;    Social History   Socioeconomic History   Marital status: Single    Spouse name: Not on file   Number of children: Not on file   Years of education: Not on file   Highest education level: Not on file  Occupational History   Not on file  Tobacco Use   Smoking status: Former    Types: Cigarettes   Smokeless tobacco: Never  Vaping Use   Vaping status: Never  Used  Substance and Sexual Activity   Alcohol use: Not Currently   Drug use: Not Currently    Types: Cocaine, IV    Comment: none for 3 years   Sexual activity: Not Currently  Other Topics Concern   Not on file  Social History Narrative   Lives in group home  [facility]. [phone]    Social Drivers of Corporate investment banker Strain: Low Risk  (10/31/2022)   Received from [facility]   Overall Financial Resource Strain (CARDIA)    Difficulty of Paying Living Expenses: Not hard at all  Food Insecurity: Patient Unable To Answer (05/14/2023)   Hunger Vital Sign    Worried About Running Out of Food in the Last Year: Patient unable to answer    Ran Out of Food in the Last Year: Patient unable to answer  Transportation Needs: Patient Unable To Answer (05/14/2023)   PRAPARE - Transportation    Lack of Transportation (Medical): Patient unable to answer    Lack of Transportation (Non-Medical): Patient unable to answer  Physical Activity: Not on file  Stress: Not on file  Social Connections: Patient Unable To Answer (05/14/2023)   Social Connection and Isolation Panel [NHANES]    Frequency of Communication with Friends and Family: Patient unable to answer    Frequency of Social Gatherings with Friends and Family: Patient unable to answer  Attends Religious Services: Patient unable to answer    Active Member of Clubs or Organizations: Patient unable to answer    Attends Club or Organization Meetings: Patient unable to answer    Marital Status: Patient unable to answer  Intimate Partner Violence: Patient Unable To Answer (05/14/2023)   Humiliation, Afraid, Rape, and Kick questionnaire    Fear of Current or Ex-Partner: Patient unable to answer    Emotionally Abused: Patient unable to answer    Physically Abused: Patient unable to answer    Sexually Abused: Patient unable to answer    History reviewed. No pertinent family history.   Current  Facility-Administered Medications:    acetaminophen (TYLENOL) tablet 650 mg, 650 mg, Oral, Q6H PRN, Lorretta Harp, MD   carvedilol (COREG) tablet 6.25 mg, 6.25 mg, Oral, BID WC, Lorretta Harp, MD, 6.25 mg at 05/19/23 1752   feeding supplement (ENSURE ENLIVE / ENSURE PLUS) liquid 237 mL, 237 mL, Oral, BID BM, Jonah Blue, MD, 237 mL at 05/19/23 1400   ferrous sulfate tablet 325 mg, 325 mg, Oral, BID WC, Lorretta Harp, MD, 325 mg at 05/19/23 1752   folic acid (FOLVITE) tablet 1 mg, 1 mg, Oral, Daily, Creig Hines, MD, 1 mg at 05/19/23 1049   hydrALAZINE (APRESOLINE) injection 5 mg, 5 mg, Intravenous, Q2H PRN, Lorretta Harp, MD   HYDROcodone-acetaminophen (NORCO/VICODIN) 5-325 MG per tablet 1-2 tablet, 1-2 tablet, Oral, Q6H PRN, Lorretta Harp, MD, 1 tablet at 05/15/23 1006   lamoTRIgine (LAMICTAL) tablet 25 mg, 25 mg, Oral, BID, Lorretta Harp, MD, 25 mg at 05/19/23 2053   levETIRAcetam (KEPPRA) tablet 500 mg, 500 mg, Oral, BID, Lorretta Harp, MD, 500 mg at 05/19/23 2052   loperamide (IMODIUM) capsule 2 mg, 2 mg, Oral, QID PRN, Lorretta Harp, MD   LORazepam (ATIVAN) injection 2 mg, 2 mg, Intravenous, Q2H PRN, Lorretta Harp, MD   ondansetron [facility]) injection 4 mg, 4 mg, Intravenous, Q8H PRN, Lorretta Harp, MD   risperiDONE (RISPERDAL) tablet 1 mg, 1 mg, Oral, TID, Lorretta Harp, MD, 1 mg at 05/19/23 2053   rosuvastatin (CRESTOR) tablet 10 mg, 10 mg, Oral, Daily, Lorretta Harp, MD, 10 mg at 05/19/23 1050   tamsulosin (FLOMAX) capsule 0.4 mg, 0.4 mg, Oral, QPC supper, Lorretta Harp, MD, 0.4 mg at 05/19/23 1752  Physical exam:  Vitals:   05/19/23 1430 05/19/23 2003 05/20/23 0429 05/20/23 0734  BP: (!) 101/53 (!) 104/57 105/64 (!) 109/55  Pulse: 65 68 64 66  Resp: 16 16 16 19   Temp: 98.1 F (36.7 C) 97.8 F (36.6 C) 98 F (36.7 C) 98 F (36.7 C)  TempSrc:  Oral    SpO2: 97% 96% 95% 96%  Weight:      Height:       Physical Exam Constitutional:      Comments: Thin and cachectic  Cardiovascular:     Rate and Rhythm:  Normal rate and regular rhythm.     Heart sounds: Normal heart sounds.  Pulmonary:     Effort: Pulmonary effort is normal.     Breath sounds: Normal breath sounds.  Skin:    General: Skin is warm and dry.  Neurological:     Mental Status: He is alert.     Comments: Not oriented to self, place and person      I have personally reviewed labs listed below:    Latest Ref Rng & Units 05/19/2023    3:56 AM  CMP  Glucose 70 - 99 mg/dL 409  BUN 8 - 23 mg/dL 23   Creatinine 1.61 - 1.24 mg/dL 0.96   Sodium 045 - 409 mmol/L 143   Potassium 3.5 - 5.1 mmol/L 3.4   Chloride 98 - 111 mmol/L 112   CO2 22 - 32 mmol/L 21   Calcium 8.9 - 10.3 mg/dL 9.0       Latest Ref Rng & Units 05/19/2023    3:56 AM  CBC  WBC 4.0 - 10.5 K/uL 10.3   Hemoglobin 13.0 - 17.0 g/dL 8.7   Hematocrit 81.1 - 52.0 % 26.0   Platelets 150 - 400 K/uL 202    I have personally reviewed Radiology images listed below: @IMAGES @  NM Bone Scan Whole Body Result Date: 05/15/2023 CLINICAL DATA:  History of prostate cancer with radiation therapy in 2019. Poor appetite, decreased oral intake and weight loss. Metastatic disease evaluation. Recent PSA levels pending. Recent liver biopsy. EXAM: NUCLEAR MEDICINE WHOLE BODY BONE SCAN TECHNIQUE: Whole body anterior and posterior images were obtained approximately 3 hours after intravenous injection of radiopharmaceutical. RADIOPHARMACEUTICALS:  21.54 mCi Technetium-50m MDP IV COMPARISON:  Whole-body bone scan 01/28/2018.  Chest CT 05/14/2023. FINDINGS: New heterogeneous uptake throughout the spine with focally increased uptake in the left aspect of the thoracic spine at approximately T7. New mild multifocal rib uptake. On the left, some of this activity is along the anterior costal margin and may be secondary to old fractures. No definite abnormal uptake within the bony pelvis or extremities. The soft tissue activity is within physiologic limits. IMPRESSION: 1. New multifocal uptake within  the thoracolumbar spine and ribs, most pronounced on the left at T7. Findings are suspicious for osseous metastatic disease. 2. Recommend correlation with pending serum PSA levels and recent liver biopsy results. Consider further evaluation with MRI of the thoracic spine without and with contrast. Electronically Signed   By: Carey Bullocks M.D.   On: 05/15/2023 14:24   US BIOPSY (LIVER) Result Date: 05/14/2023 INDICATION: [id number] Liver masses [id number] EXAM: ULTRASOUND GUIDED LIVER MASS BIOPSY COMPARISON:  CT AP, 05/13/2023. MEDICATIONS: None ANESTHESIA/SEDATION: Local anesthetic and single agent sedation was employed during this procedure. A total of fentanyl 25 mcg was administered intravenously. The patient's level of consciousness and vital signs were monitored continuously by radiology nursing throughout the procedure under my direct supervision. COMPLICATIONS: None immediate. PROCEDURE: Informed written consent was obtained from the patient and/or patient's representative after a discussion of the risks, benefits and alternatives to treatment. The patient understands and consents the procedure. A timeout was performed prior to the initiation of the procedure. Ultrasound scanning was performed of the right upper abdominal quadrant demonstrates innumerable, multifocal liver masses The RIGHT hepatic lobe was selected for biopsy and the procedure was planned. The right upper abdominal quadrant was prepped and draped in the usual sterile fashion. The overlying soft tissues were anesthetized with 1% lidocaine. A 17 gauge co-axial needle was advanced into a peripheral aspect of the lesion. This was followed by 4 core biopsies with an 18 gauge core device under direct ultrasound guidance. The needle was removed, then superficial hemostasis was obtained with manual compression. Post procedural scanning was negative for definitive area of hemorrhage or additional complication. A dressing was placed. The patient  tolerated the procedure well without immediate post procedural complication. IMPRESSION: 1. Innumerable, multifocal lesions replacing liver. 2. Successful ultrasound-guided core needle biopsy of a liver mass. Roanna Banning, MD [facility] Electronically Signed   By: Gerome Sam.D.  On: 05/14/2023 17:05   CT CHEST WO CONTRAST Result Date: 05/14/2023 CLINICAL DATA:  Altered mental status with abnormal renal function and anemia with weight loss. Evaluation of occult malignancy EXAM: CT CHEST WITHOUT CONTRAST TECHNIQUE: Multidetector CT imaging of the chest was performed following the standard protocol without IV contrast. RADIATION DOSE REDUCTION: This exam was performed according to the departmental dose-optimization program which includes automated exposure control, adjustment of the mA and/or kV according to patient size and/or use of iterative reconstruction technique. COMPARISON:  Chest radiograph dated 05/13/2023, CTA chest dated 02/04/2022, 01/13/2015 FINDINGS: Cardiovascular: Normal heart size. No significant pericardial fluid/thickening. Great vessels are normal in course and caliber. Coronary artery calcifications and aortic atherosclerosis. Mediastinum/Nodes: Imaged thyroid gland without nodules meeting criteria for imaging follow-up by size. Normal esophagus. No pathologically enlarged axillary, supraclavicular, mediastinal, or hilar lymph nodes. Lungs/Pleura: The central airways are patent. Mild upper lung predominant paraseptal emphysema. New basilar left lower lobe nodules measuring 6 x 5 mm (3:89), 5 x 5 mm (3:94), and 10 x 5 mm (3:95). Additional 3 mm central left lower lobe nodule (3:87). Unchanged irregular, spiculated 7 x 6 mm right middle lobe nodule (3:84) dating back to 2016, likely benign. Bilateral lower lobe linear atelectasis/scarring. No pneumothorax. Trace bilateral pleural effusions. Upper abdomen: Mildly nodular hepatic  contour. Heterogeneous appearance of the hepatic parenchyma. Please see separately dictated CT abdomen and pelvis report for detailed findings. Musculoskeletal: No acute or abnormal lytic or blastic osseous lesions. Multilevel degenerative changes of the thoracic spine. IMPRESSION: 1. New basilar left lower lobe nodules, indeterminate. 2. Trace bilateral pleural effusions. 3. Mildly nodular hepatic contour. Heterogeneous appearance of the hepatic parenchyma. Please see separately dictated CT abdomen and pelvis report for detailed findings. 4. Aortic Atherosclerosis (ICD10-I70.0) and Emphysema (ICD10-J43.9). Coronary artery calcifications. Assessment for potential risk factor modification, dietary therapy or pharmacologic therapy may be warranted, if clinically indicated. Electronically Signed   By: Agustin Cree M.D.   On: 05/14/2023 11:57   CT ABDOMEN PELVIS WO CONTRAST Result Date: 05/13/2023 CLINICAL DATA:  Acute kidney injury. Loss of appetite. Weight loss. Evaluate for malignancy. EXAM: CT ABDOMEN AND PELVIS WITHOUT CONTRAST TECHNIQUE: Multidetector CT imaging of the abdomen and pelvis was performed following the standard protocol without IV contrast. RADIATION DOSE REDUCTION: This exam was performed according to the departmental dose-optimization program which includes automated exposure control, adjustment of the mA and/or kV according to patient size and/or use of iterative reconstruction technique. COMPARISON:  01/28/2018 and chest CTA 02/04/2022 FINDINGS: Lower chest: Irregular pulmonary nodule in right middle lobe measures 6 mm on image 9/5 is stable since 2023. Scarring or atelectasis in the posterior right lower lobe. New pulmonary nodules at the posterior left lower lobe on image 18/5, largest measures 6 mm. Question another new nodule in the right middle lobe region on image 4/5 that measures 3 mm. Hepatobiliary: Liver is enlarged and diffusely heterogeneous. Nodular contour of the liver. Findings are  concerning for multiple hepatic lesions. Largest lesion appears to be in the posterior right hepatic lobe on image 43/3 that measures roughly 2.8 cm. Limited evaluation for biliary dilatation on this study without intravascular contrast. Gallbladder is not distended. Pancreas: Unremarkable. No pancreatic ductal dilatation or surrounding inflammatory changes. Spleen: Normal in size without focal abnormality. Adrenals/Urinary Tract: Normal adrenal glands. High-density material in the right kidney lower pole region could represent small stones. No hydronephrosis. Calcification in the left kidney lower pole may be in the cortex. Probable left renal cysts but poorly characterized  on this exam without intravascular contrast. Mild bladder wall thickening is likely chronic. Stomach/Bowel: Normal appearance of the stomach. Normal appearance of the small bowel without dilatation. No gross abnormality in the colon. No evidence for acute bowel inflammation. Vascular/Lymphatic: New right inguinal lymphadenopathy. Right inguinal lymphadenopathy seen on image 90/3. Mildly enlarged right pelvic sidewall lymph node on image 70/3 measures 0.9 cm in the short axis. Atherosclerotic disease in the abdominal aorta without aneurysm. Reproductive: Prostate is unremarkable. Other: Evidence for previous right inguinal hernia repair. Small new nodular densities with mild stranding in the left paracolic region on image 42/3 and image 44/3. Largest peritoneal nodule measures 1.0 cm. Findings are concerning for peritoneal disease in this area. There appears to be 2 small periumbilical hernias containing fat. Question a small peritoneal nodule in the right lower abdomen on image 62/3. Negative for free fluid. Negative for free air. Musculoskeletal: Disc space narrowing and degenerative changes in lower lumbar spine. No suspicious bone lesions. IMPRESSION: 1. Concern for neoplastic and metastatic disease. The liver is enlarged and diffusely  heterogeneous. Concern for multiple liver lesions. In addition, there is new right inguinal lymphadenopathy. Concern for small peritoneal nodules that could represent peritoneal metastatic disease. In addition, there are new small pulmonary nodules. Potentially, the liver and right inguinal region could be amendable for ultrasound-guided biopsy. 2. Small ventral hernias containing fat. 3. Question nephrolithiasis without hydronephrosis. These results were called by telephone at the time of interpretation on 05/13/2023 at 8:44 pm to provider [facility] , who verbally acknowledged these results. Electronically Signed   By: Richarda Overlie M.D.   On: 05/13/2023 20:47   DG Chest Portable 1 View Result Date: 05/13/2023 CLINICAL DATA:  poorly responsive poorly responsive and abnormal lab. Hx of HTN and former smoker. EXAM: PORTABLE CHEST 1 VIEW COMPARISON:  Chest x-ray 02/04/2022, CT chest 02/04/2022 FINDINGS: The heart and mediastinal contours are unchanged. Atherosclerotic plaque. Chronic coarsened markings. No focal consolidation. No pulmonary edema. No pleural effusion. No pneumothorax. No acute osseous abnormality. IMPRESSION: 1. No active disease. 2.  Aortic Atherosclerosis (ICD10-I70.0). Electronically Signed   By: Tish Frederickson M.D.   On: 05/13/2023 20:32   CT HEAD WO CONTRAST ( ) Result Date: 05/13/2023 CLINICAL DATA:  poor responsiveness, eval ich, cva EXAM: CT HEAD WITHOUT CONTRAST TECHNIQUE: Contiguous axial images were obtained from the base of the skull through the vertex without intravenous contrast. RADIATION DOSE REDUCTION: This exam was performed according to the departmental dose-optimization program which includes automated exposure control, adjustment of the mA and/or kV according to patient size and/or use of iterative reconstruction technique. COMPARISON:  CT head 02/04/2022. FINDINGS: Brain: No evidence of acute infarction, hemorrhage, hydrocephalus, extra-axial collection or mass lesion/mass  effect. Vascular: No hyperdense vessel. Skull: No acute fracture. Sinuses/Orbits: Clear sinuses.  No acute orbital findings. Other: No mastoid effusions. IMPRESSION: No evidence of acute intracranial abnormality. Electronically Signed   By: Feliberto Harts M.D.   On: 05/13/2023 20:23     Assessment and plan- Patient is a 78 y.o. male with prior history of prostate cancer admitted for significant weight loss found to have liver lesions consistent with malignant melanoma  Results of liver biopsy consistent with malignant melanoma.  This would constitute stage IV disease.  Given patient's underlying dementia I do not think that he is a candidate for systemic treatment which would include IV immunotherapy Q3 weeks. This would include coming to cancer center for blood work, sitting through treatments etc which patient is unable to do  due to underlying dementia.  I have discussed all this with patient's brother [name] over the phone in detail.  He understands that patient's overall prognosis is poor likely less than 6 months without treatment.  He is in agreement with discharge with hospice.  Presently there is a concern that although patient can go back to his group home with hospice following, if he were to decline further at the group home transition to a long-term care facility or hospice home at that point may be difficult.  Therefore plan is to discharge patient to a long-term care facility with hospice following.  Patient's brother understands that someone from the hospital will update him once this becomes available   Visit Diagnosis Metastatic melanoma Liver metastases   Dr. Owens Shark, MD, MPH [facility] [phone] 05/20/2023 8:14 AM

## 2023-05-20 NOTE — Progress Notes (Signed)
 PROGRESS NOTE    Jeremy [name]  ZOX:[id number] DOB: 1945-02-27 DOA: 05/13/2023 PCP: Marguarite Arbour, MD    Brief Narrative:   78yo with h/o prostate CA, chronic systolic CHF, HTN, HLD, CAD, dementia, seizure d/o, stage 3B CKD, and cocaine abuse who presented on 3/26 with weight loss. Evaluation in the ER with imaging suggestive of widely metastatic cancer, uncertain primary. Oncology consulting.   Decline in mental status and overall appearance noted 4/2.  Discussed with nursing and TOC.  Attempting to find facility bed with hospice care.  In the meantime we will de-escalate to full comfort measures.   Assessment & Plan:   Principal Problem:   Metastasized cancer Active Problems:   Prostate cancer (HCC)   Acute kidney injury superimposed on stage 3b chronic kidney disease (HCC)   Normocytic anemia   CAD (coronary artery disease)   Chronic combined systolic and diastolic CHF (congestive heart failure) (HCC)   Leukocytosis   Dementia without behavioral disturbance (HCC)   Palliative care encounter   Metastatic malignant neoplasm [facility])  Patient presented with failure to thrive from group home.  Setting of known dementia.  CT scan with widely metastatic cancer.  Oncology consulted.  Hospice recommended.  Patient had acute decline in functional status and clinical appearance noted on 4/2.  Will transition to full comfort measures.  DNR.  All medications not focused on patient comfort have been discontinued.  relax dietary and visitation restrictions.   DVT prophylaxis: None/comfort Code Status: DNR/comfort Family Communication: None today Disposition Plan: Status is: Inpatient Remains inpatient appropriate because: Unsafe discharge plan.  Pending placement at facility with hospice care.  Full comfort measures.   Level of care: Med-Surg  Consultants:  None  Procedures:  None  Antimicrobials: None   Subjective: Seen and examined.  Receiving care from patient  care tech.  Appears weak and frail.  Objective: Vitals:   05/19/23 1430 05/19/23 2003 05/20/23 0429 05/20/23 0734  BP: (!) 101/53 (!) 104/57 105/64 (!) 109/55  Pulse: 65 68 64 66  Resp: 16 16 16 19   Temp: 98.1 F (36.7 C) 97.8 F (36.6 C) 98 F (36.7 C) 98 F (36.7 C)  TempSrc:  Oral    SpO2: 97% 96% 95% 96%  Weight:      Height:        Intake/Output Summary (Last 24 hours) at 05/20/2023 1229 Last data filed at 05/19/2023 1320 Gross per 24 hour  Intake --  Output 500 ml  Net -500 ml   Filed Weights   05/15/23 0500 05/16/23 0500 05/17/23 0500  Weight: 60.7 kg 58.8 kg 58.3 kg    Examination:  General exam: Ill-appearing.  Appears weak and frail Respiratory system: Poor respiratory effort.  Normal work of breathing.  1 L Cardiovascular system: S1-S2, RRR, no murmurs, no pedal edema Gastrointestinal system: Thin, soft, NT T/ND, normal bowel sounds Central nervous system: Alert, obtunded.  Unable to assess orientation Extremities: Symmetrically decreased power.  Gait not assessed Skin: No rashes, lesions or ulcers Psychiatry: Unable to assess    Data Reviewed: I have personally reviewed following labs and imaging studies  CBC: Recent Labs  Lab 05/13/23 1824 05/14/23 0810 05/15/23 0549 05/16/23 0434 05/17/23 0418 05/19/23 0356  WBC 12.3* 10.8* 8.9 9.0 8.6 10.3  NEUTROABS 8.2*  --  6.6 6.3 6.1 7.6  HGB 7.7* 6.5* 8.7* 8.9* 8.5* 8.7*  HCT 24.3* 20.7* 25.8* 26.5* 25.4* 26.0*  MCV 87.4 85.9 84.0 83.6 87.3 85.0  PLT 245 240 207  217 198 202   Basic Metabolic Panel: Recent Labs  Lab 05/14/23 0810 05/15/23 0549 05/16/23 0434 05/17/23 0418 05/19/23 0356  NA 136 139 140 142 143  K 4.9 4.2 4.1 3.7 3.4*  CL 107 109 111 112* 112*  CO2 20* 20* 20* 22 21*  GLUCOSE 100* 81 82 84 109*  BUN 33* 30* 32* 30* 23  CREATININE 2.82* 2.16* 2.17* 2.02* 1.76*  CALCIUM 8.4* 8.3* 8.5* 8.5* 9.0   GFR: Estimated Creatinine Clearance: 29 mL/min (A) (by C-G formula based on SCr of  1.76 mg/dL (H)). Liver Function Tests: Recent Labs  Lab 05/13/23 1824 05/15/23 0549  AST 64* 39  ALT 33 26  ALKPHOS 434* 357*  BILITOT 0.7 1.5*  PROT 6.5 5.8*  ALBUMIN 2.4* 2.0*   No results for input(s): "LIPASE", "AMYLASE" in the last 168 hours. No results for input(s): "AMMONIA" in the last 168 hours. Coagulation Profile: Recent Labs  Lab 05/13/23 2222  INR 1.3*   Cardiac Enzymes: No results for input(s): "CKTOTAL", "CKMB", "CKMBINDEX", "TROPONINI" in the last 168 hours. BNP (last 3 results) No results for input(s): "PROBNP" in the last 8760 hours. HbA1C: No results for input(s): "HGBA1C" in the last 72 hours. CBG: Recent Labs  Lab 05/16/23 0814 05/16/23 1221 05/16/23 1610  GLUCAP 78 125* 98   Lipid Profile: No results for input(s): "CHOL", "HDL", "LDLCALC", "TRIG", "CHOLHDL", "LDLDIRECT" in the last 72 hours. Thyroid Function Tests: No results for input(s): "TSH", "T4TOTAL", "FREET4", "T3FREE", "THYROIDAB" in the last 72 hours. Anemia Panel: No results for input(s): "VITAMINB12", "FOLATE", "FERRITIN", "TIBC", "IRON", "RETICCTPCT" in the last 72 hours. Sepsis Labs: No results for input(s): "PROCALCITON", "LATICACIDVEN" in the last 168 hours.  No results found for this or any previous visit (from the past 240 hours).       Radiology Studies: No results found.      Scheduled Meds:  feeding supplement  237 mL Oral BID BM   lamoTRIgine  25 mg Oral BID   levETIRAcetam  500 mg Oral BID   risperiDONE  0.5 mg Oral BID   tamsulosin  0.4 mg Oral QPC supper   Continuous Infusions:   LOS: 7 days      Tresa Moore, MD [facility]   If 7PM-7AM, please contact night-coverage  05/20/2023, 12:29 PM

## 2023-05-20 NOTE — Progress Notes (Signed)
 PT Cancellation Note  Patient Details Name: Jeremy [name] MRN: [id number] DOB: 1946/02/07   Cancelled Treatment:    Reason Eval/Treat Not Completed: Other (comment). Treatment attempted. Pt appears to be medically declining, unable to maintain alertness to participate. Pt unable to keep eyes open. Attempted to let pt drink his water, but unable to keep lips around straw. Secure chat sent to care team about goals of care. Will re-attempt one more time this week if appropriate prior to signing off if needs change.   Breckin Zafar 05/20/2023, 9:57 AM Elizabeth Palau, PT, DPT, GCS [phone]

## 2023-05-21 DIAGNOSIS — C801 Malignant (primary) neoplasm, unspecified: Secondary | ICD-10-CM | POA: Diagnosis not present

## 2023-05-21 MED ORDER — RISPERIDONE 0.5 MG PO TBDP: 0.5000 mg | ORAL_TABLET | Freq: Two times a day (BID) | ORAL | Status: DC

## 2023-05-21 NOTE — Discharge Summary (Signed)
 Physician Discharge Summary  Jeremy [name]:[id number] DOB: [birth year] DOA: 05/13/2023  PCP: Marguarite Arbour, MD  Admit date: 05/13/2023 Discharge date: 05/21/2023  Admitted From: Home Disposition:  Inpatient hospice unit  Recommendations for Outpatient Follow-up:  Per hospice providers   Home Health:No Equipment/Devices:None   Discharge Condition:Hospice  CODE STATUS:DNR  Diet recommendation: Comfort  Brief/Interim Summary:   78yo with h/o prostate CA, chronic systolic CHF, HTN, HLD, CAD, dementia, seizure d/o, stage 3B CKD, and cocaine abuse who presented on 3/26 with weight loss. Evaluation in the ER with imaging suggestive of widely metastatic cancer, uncertain primary. Oncology consulting.    Decline in mental status and overall appearance noted 4/2.  Discussed with nursing and TOC.  Attempting to find facility bed with hospice care.  In the meantime we will de-escalate to full comfort measures.    Discharge Diagnoses:  Principal Problem:   Metastasized cancer Active Problems:   Prostate cancer (HCC)   Acute kidney injury superimposed on stage 3b chronic kidney disease (HCC)   Normocytic anemia   CAD (coronary artery disease)   Chronic combined systolic and diastolic CHF (congestive heart failure) (HCC)   Leukocytosis   Dementia without behavioral disturbance (HCC)   Palliative care encounter   Metastatic malignant neoplasm [facility])  Patient presented with failure to thrive from group home.  Setting of known dementia.  CT scan with widely metastatic cancer.  Oncology consulted.  Hospice recommended.   Patient had acute decline in functional status and clinical appearance noted on 4/2.  Will transition to full comfort measures.  DNR.  All medications not focused on patient comfort have been discontinued.  relax dietary and visitation restrictions.  Discussed with hospice liaison.  Accepted to hospice home.  Appropriate for disposition.   Discharge  Instructions  Discharge Instructions     Diet - low sodium heart healthy   Complete by: As directed    Increase activity slowly   Complete by: As directed    No wound care   Complete by: As directed       Allergies as of 05/21/2023   No Known Allergies      Medication List     STOP taking these medications    aspirin EC 81 MG tablet   Calcium + Vitamin D3 600-10 MG-MCG Tabs Generic drug: Calcium Carb-Cholecalciferol   carvedilol 6.25 MG tablet Commonly known as: COREG   cyanocobalamin 1000 MCG/ML injection Commonly known as: VITAMIN B12   ferrous sulfate 325 (65 FE) MG EC tablet   loperamide 2 MG capsule Commonly known as: IMODIUM   losartan 25 MG tablet Commonly known as: COZAAR   magnesium hydroxide 400 MG/5ML suspension Commonly known as: MILK OF MAGNESIA   ondansetron 4 MG disintegrating tablet Commonly known as: ZOFRAN-ODT   risperiDONE 2 MG tablet Commonly known as: RISPERDAL Replaced by: risperiDONE 0.5 MG disintegrating tablet   rosuvastatin 10 MG tablet Commonly known as: CRESTOR   tamsulosin 0.4 MG Caps capsule Commonly known as: FLOMAX       TAKE these medications    acetaminophen 325 MG tablet Commonly known as: TYLENOL Take 650 mg by mouth every 4 (four) hours as needed for moderate pain, fever or headache.   alum & mag hydroxide-simeth 200-200-20 MG/5ML suspension Commonly known as: MAALOX/MYLANTA Take 30 mLs by mouth daily as needed for indigestion or heartburn.   guaiFENesin 100 MG/5ML liquid Commonly known as: ROBITUSSIN Take 200 mg by mouth every 6 (six) hours as needed for cough.  HYDROcodone-acetaminophen 5-325 MG tablet Commonly known as: Norco Take 1-2 tablets by mouth every 6 (six) hours as needed for moderate pain.   lamoTRIgine 25 MG tablet Commonly known as: LAMICTAL Take 25 mg by mouth 2 (two) times daily.   levETIRAcetam 500 MG tablet Commonly known as: KEPPRA Take 500 mg by mouth 2 (two) times daily.    risperiDONE 0.5 MG disintegrating tablet Commonly known as: RISPERDAL M-TABS Take 1 tablet (0.5 mg total) by mouth 2 (two) times daily. Replaces: risperiDONE 2 MG tablet        No Known Allergies  Consultations: Palliative care   Procedures/Studies: NM Bone Scan Whole Body Result Date: 05/15/2023 CLINICAL DATA:  History of prostate cancer with radiation therapy in 2019. Poor appetite, decreased oral intake and weight loss. Metastatic disease evaluation. Recent PSA levels pending. Recent liver biopsy. EXAM: NUCLEAR MEDICINE WHOLE BODY BONE SCAN TECHNIQUE: Whole body anterior and posterior images were obtained approximately 3 hours after intravenous injection of radiopharmaceutical. RADIOPHARMACEUTICALS:  21.54 mCi Technetium-81m MDP IV COMPARISON:  Whole-body bone scan 01/28/2018.  Chest CT 05/14/2023. FINDINGS: New heterogeneous uptake throughout the spine with focally increased uptake in the left aspect of the thoracic spine at approximately T7. New mild multifocal rib uptake. On the left, some of this activity is along the anterior costal margin and may be secondary to old fractures. No definite abnormal uptake within the bony pelvis or extremities. The soft tissue activity is within physiologic limits. IMPRESSION: 1. New multifocal uptake within the thoracolumbar spine and ribs, most pronounced on the left at T7. Findings are suspicious for osseous metastatic disease. 2. Recommend correlation with pending serum PSA levels and recent liver biopsy results. Consider further evaluation with MRI of the thoracic spine without and with contrast. Electronically Signed   By: Carey Bullocks M.D.   On: 05/15/2023 14:24   US BIOPSY (LIVER) Result Date: 05/14/2023 INDICATION: [id number] Liver masses [id number] EXAM: ULTRASOUND GUIDED LIVER MASS BIOPSY COMPARISON:  CT AP, 05/13/2023. MEDICATIONS: None ANESTHESIA/SEDATION: Local anesthetic and single agent sedation was employed during this procedure. A total of  fentanyl 25 mcg was administered intravenously. The patient's level of consciousness and vital signs were monitored continuously by radiology nursing throughout the procedure under my direct supervision. COMPLICATIONS: None immediate. PROCEDURE: Informed written consent was obtained from the patient and/or patient's representative after a discussion of the risks, benefits and alternatives to treatment. The patient understands and consents the procedure. A timeout was performed prior to the initiation of the procedure. Ultrasound scanning was performed of the right upper abdominal quadrant demonstrates innumerable, multifocal liver masses The RIGHT hepatic lobe was selected for biopsy and the procedure was planned. The right upper abdominal quadrant was prepped and draped in the usual sterile fashion. The overlying soft tissues were anesthetized with 1% lidocaine. A 17 gauge co-axial needle was advanced into a peripheral aspect of the lesion. This was followed by 4 core biopsies with an 18 gauge core device under direct ultrasound guidance. The needle was removed, then superficial hemostasis was obtained with manual compression. Post procedural scanning was negative for definitive area of hemorrhage or additional complication. A dressing was placed. The patient tolerated the procedure well without immediate post procedural complication. IMPRESSION: 1. Innumerable, multifocal lesions replacing liver. 2. Successful ultrasound-guided core needle biopsy of a liver mass. Roanna Banning, MD [facility] Electronically Signed   By: Roanna Banning M.D.   On: 05/14/2023 17:05   CT CHEST WO CONTRAST Result Date:  05/14/2023 CLINICAL DATA:  Altered mental status with abnormal renal function and anemia with weight loss. Evaluation of occult malignancy EXAM: CT CHEST WITHOUT CONTRAST TECHNIQUE: Multidetector CT imaging of the chest was performed following the standard  protocol without IV contrast. RADIATION DOSE REDUCTION: This exam was performed according to the departmental dose-optimization program which includes automated exposure control, adjustment of the mA and/or kV according to patient size and/or use of iterative reconstruction technique. COMPARISON:  Chest radiograph dated 05/13/2023, CTA chest dated 02/04/2022, 01/13/2015 FINDINGS: Cardiovascular: Normal heart size. No significant pericardial fluid/thickening. Great vessels are normal in course and caliber. Coronary artery calcifications and aortic atherosclerosis. Mediastinum/Nodes: Imaged thyroid gland without nodules meeting criteria for imaging follow-up by size. Normal esophagus. No pathologically enlarged axillary, supraclavicular, mediastinal, or hilar lymph nodes. Lungs/Pleura: The central airways are patent. Mild upper lung predominant paraseptal emphysema. New basilar left lower lobe nodules measuring 6 x 5 mm (3:89), 5 x 5 mm (3:94), and 10 x 5 mm (3:95). Additional 3 mm central left lower lobe nodule (3:87). Unchanged irregular, spiculated 7 x 6 mm right middle lobe nodule (3:84) dating back to 2016, likely benign. Bilateral lower lobe linear atelectasis/scarring. No pneumothorax. Trace bilateral pleural effusions. Upper abdomen: Mildly nodular hepatic contour. Heterogeneous appearance of the hepatic parenchyma. Please see separately dictated CT abdomen and pelvis report for detailed findings. Musculoskeletal: No acute or abnormal lytic or blastic osseous lesions. Multilevel degenerative changes of the thoracic spine. IMPRESSION: 1. New basilar left lower lobe nodules, indeterminate. 2. Trace bilateral pleural effusions. 3. Mildly nodular hepatic contour. Heterogeneous appearance of the hepatic parenchyma. Please see separately dictated CT abdomen and pelvis report for detailed findings. 4. Aortic Atherosclerosis (ICD10-I70.0) and Emphysema (ICD10-J43.9). Coronary artery calcifications. Assessment for  potential risk factor modification, dietary therapy or pharmacologic therapy may be warranted, if clinically indicated. Electronically Signed   By: Agustin Cree M.D.   On: 05/14/2023 11:57   CT ABDOMEN PELVIS WO CONTRAST Result Date: 05/13/2023 CLINICAL DATA:  Acute kidney injury. Loss of appetite. Weight loss. Evaluate for malignancy. EXAM: CT ABDOMEN AND PELVIS WITHOUT CONTRAST TECHNIQUE: Multidetector CT imaging of the abdomen and pelvis was performed following the standard protocol without IV contrast. RADIATION DOSE REDUCTION: This exam was performed according to the departmental dose-optimization program which includes automated exposure control, adjustment of the mA and/or kV according to patient size and/or use of iterative reconstruction technique. COMPARISON:  01/28/2018 and chest CTA 02/04/2022 FINDINGS: Lower chest: Irregular pulmonary nodule in right middle lobe measures 6 mm on image 9/5 is stable since 2023. Scarring or atelectasis in the posterior right lower lobe. New pulmonary nodules at the posterior left lower lobe on image 18/5, largest measures 6 mm. Question another new nodule in the right middle lobe region on image 4/5 that measures 3 mm. Hepatobiliary: Liver is enlarged and diffusely heterogeneous. Nodular contour of the liver. Findings are concerning for multiple hepatic lesions. Largest lesion appears to be in the posterior right hepatic lobe on image 43/3 that measures roughly 2.8 cm. Limited evaluation for biliary dilatation on this study without intravascular contrast. Gallbladder is not distended. Pancreas: Unremarkable. No pancreatic ductal dilatation or surrounding inflammatory changes. Spleen: Normal in size without focal abnormality. Adrenals/Urinary Tract: Normal adrenal glands. High-density material in the right kidney lower pole region could represent small stones. No hydronephrosis. Calcification in the left kidney lower pole may be in the cortex. Probable left renal cysts but  poorly characterized on this exam without intravascular contrast. Mild bladder wall thickening  is likely chronic. Stomach/Bowel: Normal appearance of the stomach. Normal appearance of the small bowel without dilatation. No gross abnormality in the colon. No evidence for acute bowel inflammation. Vascular/Lymphatic: New right inguinal lymphadenopathy. Right inguinal lymphadenopathy seen on image 90/3. Mildly enlarged right pelvic sidewall lymph node on image 70/3 measures 0.9 cm in the short axis. Atherosclerotic disease in the abdominal aorta without aneurysm. Reproductive: Prostate is unremarkable. Other: Evidence for previous right inguinal hernia repair. Small new nodular densities with mild stranding in the left paracolic region on image 42/3 and image 44/3. Largest peritoneal nodule measures 1.0 cm. Findings are concerning for peritoneal disease in this area. There appears to be 2 small periumbilical hernias containing fat. Question a small peritoneal nodule in the right lower abdomen on image 62/3. Negative for free fluid. Negative for free air. Musculoskeletal: Disc space narrowing and degenerative changes in lower lumbar spine. No suspicious bone lesions. IMPRESSION: 1. Concern for neoplastic and metastatic disease. The liver is enlarged and diffusely heterogeneous. Concern for multiple liver lesions. In addition, there is new right inguinal lymphadenopathy. Concern for small peritoneal nodules that could represent peritoneal metastatic disease. In addition, there are new small pulmonary nodules. Potentially, the liver and right inguinal region could be amendable for ultrasound-guided biopsy. 2. Small ventral hernias containing fat. 3. Question nephrolithiasis without hydronephrosis. These results were called by telephone at the time of interpretation on 05/13/2023 at 8:44 pm to provider [facility] , who verbally acknowledged these results. Electronically Signed   By: Richarda Overlie M.D.   On: 05/13/2023 20:47    DG Chest Portable 1 View Result Date: 05/13/2023 CLINICAL DATA:  poorly responsive poorly responsive and abnormal lab. Hx of HTN and former smoker. EXAM: PORTABLE CHEST 1 VIEW COMPARISON:  Chest x-ray 02/04/2022, CT chest 02/04/2022 FINDINGS: The heart and mediastinal contours are unchanged. Atherosclerotic plaque. Chronic coarsened markings. No focal consolidation. No pulmonary edema. No pleural effusion. No pneumothorax. No acute osseous abnormality. IMPRESSION: 1. No active disease. 2.  Aortic Atherosclerosis (ICD10-I70.0). Electronically Signed   By: Tish Frederickson M.D.   On: 05/13/2023 20:32   CT HEAD WO CONTRAST ( ) Result Date: 05/13/2023 CLINICAL DATA:  poor responsiveness, eval ich, cva EXAM: CT HEAD WITHOUT CONTRAST TECHNIQUE: Contiguous axial images were obtained from the base of the skull through the vertex without intravenous contrast. RADIATION DOSE REDUCTION: This exam was performed according to the departmental dose-optimization program which includes automated exposure control, adjustment of the mA and/or kV according to patient size and/or use of iterative reconstruction technique. COMPARISON:  CT head 02/04/2022. FINDINGS: Brain: No evidence of acute infarction, hemorrhage, hydrocephalus, extra-axial collection or mass lesion/mass effect. Vascular: No hyperdense vessel. Skull: No acute fracture. Sinuses/Orbits: Clear sinuses.  No acute orbital findings. Other: No mastoid effusions. IMPRESSION: No evidence of acute intracranial abnormality. Electronically Signed   By: Feliberto Harts M.D.   On: 05/13/2023 20:23      Subjective: Seen and examined on day of dc.  Appropriate for transfer to inpatient hospice unit.  Discharge Exam: Vitals:   05/20/23 1954 05/21/23 0913  BP: (!) 125/58 125/62  Pulse: 79 84  Resp: (!) 24 18  Temp: 97.9 F (36.6 C) 97.6 F (36.4 C)  SpO2: 98% 98%   Vitals:   05/20/23 1549 05/20/23 1954 05/21/23 0433 05/21/23 0913  BP: (!) 106/55 (!)  125/58  125/62  Pulse: 71 79  84  Resp: 19 (!) 24  18  Temp: 98.2 F (36.8 C) 97.9 F (36.6 C)  97.6 F (36.4 C)  TempSrc: Oral Oral  Oral  SpO2: 95% 98%  98%  Weight:   57.7 kg   Height:        General: Pt is alert, awake, not in acute distress Cardiovascular: RRR, S1/S2 +, no rubs, no gallops Respiratory: CTA bilaterally, no wheezing, no rhonchi Abdominal: Soft, NT, ND, bowel sounds + Extremities: no edema, no cyanosis    The results of significant diagnostics from this hospitalization (including imaging, microbiology, ancillary and laboratory) are listed below for reference.     Microbiology: No results found for this or any previous visit (from the past 240 hours).   Labs: BNP (last 3 results) Recent Labs    05/13/23 1824  BNP 124.2*   Basic Metabolic Panel: Recent Labs  Lab 05/15/23 0549 05/16/23 0434 05/17/23 0418 05/19/23 0356  NA 139 140 142 143  K 4.2 4.1 3.7 3.4*  CL 109 111 112* 112*  CO2 20* 20* 22 21*  GLUCOSE 81 82 84 109*  BUN 30* 32* 30* 23  CREATININE 2.16* 2.17* 2.02* 1.76*  CALCIUM 8.3* 8.5* 8.5* 9.0   Liver Function Tests: Recent Labs  Lab 05/15/23 0549  AST 39  ALT 26  ALKPHOS 357*  BILITOT 1.5*  PROT 5.8*  ALBUMIN 2.0*   No results for input(s): "LIPASE", "AMYLASE" in the last 168 hours. No results for input(s): "AMMONIA" in the last 168 hours. CBC: Recent Labs  Lab 05/15/23 0549 05/16/23 0434 05/17/23 0418 05/19/23 0356  WBC 8.9 9.0 8.6 10.3  NEUTROABS 6.6 6.3 6.1 7.6  HGB 8.7* 8.9* 8.5* 8.7*  HCT 25.8* 26.5* 25.4* 26.0*  MCV 84.0 83.6 87.3 85.0  PLT 207 217 198 202   Cardiac Enzymes: No results for input(s): "CKTOTAL", "CKMB", "CKMBINDEX", "TROPONINI" in the last 168 hours. BNP: Invalid input(s): "POCBNP" CBG: Recent Labs  Lab 05/16/23 0814 05/16/23 1221 05/16/23 1610  GLUCAP 78 125* 98   D-Dimer No results for input(s): "DDIMER" in the last 72 hours. Hgb A1c No results for input(s): "HGBA1C" in the  last 72 hours. Lipid Profile No results for input(s): "CHOL", "HDL", "LDLCALC", "TRIG", "CHOLHDL", "LDLDIRECT" in the last 72 hours. Thyroid function studies No results for input(s): "TSH", "T4TOTAL", "T3FREE", "THYROIDAB" in the last 72 hours.  Invalid input(s): "FREET3" Anemia work up No results for input(s): "VITAMINB12", "FOLATE", "FERRITIN", "TIBC", "IRON", "RETICCTPCT" in the last 72 hours. Urinalysis    Component Value Date/Time   COLORURINE YELLOW (A) 03/28/2023 1441   APPEARANCEUR CLEAR (A) 03/28/2023 1441   APPEARANCEUR Hazy 08/14/2013 2304   LABSPEC 1.013 03/28/2023 1441   LABSPEC 1.010 08/14/2013 2304   PHURINE 5.0 03/28/2023 1441   GLUCOSEU NEGATIVE 03/28/2023 1441   GLUCOSEU Negative 08/14/2013 2304   HGBUR SMALL (A) 03/28/2023 1441   BILIRUBINUR NEGATIVE 03/28/2023 1441   BILIRUBINUR Negative 08/14/2013 2304   KETONESUR NEGATIVE 03/28/2023 1441   PROTEINUR 30 (A) 03/28/2023 1441   NITRITE NEGATIVE 03/28/2023 1441   LEUKOCYTESUR NEGATIVE 03/28/2023 1441   LEUKOCYTESUR Negative 08/14/2013 2304   Sepsis Labs Recent Labs  Lab 05/15/23 0549 05/16/23 0434 05/17/23 0418 05/19/23 0356  WBC 8.9 9.0 8.6 10.3   Microbiology No results found for this or any previous visit (from the past 240 hours).   Time coordinating discharge: Over 30 minutes  SIGNED:   Tresa Moore, MD  [facility] 05/21/2023, 11:46 AM Pager   If 7PM-7AM, please contact night-coverage

## 2023-05-21 NOTE — Consult Note (Signed)
 [facility] Liaison Consult Note   05/21/2023  Jeremy [name] Jul 05, 1945 [id number]  *Coverage for [name], RN [facility] Liaison updated review for disposition  Patient to transition to hospice for post hospital care.   Plan: Under Hospice care patient needs are to be met at hospice level of care for post hospital.  Charlesetta Shanks, RN, BSN, CCM [place]  [facility], [facility] Liaison Direct Dial: [phone] or secure chat Email: [email or web address]

## 2023-05-21 NOTE — Progress Notes (Addendum)
 [facility] Liaison Note  Due to a decline in patient's conditon, patient approved for admission to the Hospice Home today.  Paitent's brother, Eliseo Gum, will be signing consents for admission to the Hospice home today.  RN, please call report to the Hospice Home at [phone].    Thank you for the opportunity to participate in this patient's care  [facility] Liaison [phone]

## 2023-05-21 NOTE — TOC Transition Note (Signed)
 Transition of Care [facility]) - Discharge Note   Patient Details  Name: Jeremy [name] MRN: [id number] Date of Birth: August 31, 1945  Transition of Care [facility] CM/SW Contact:  Chapman Fitch, RN Phone Number: 05/21/2023, 12:38 PM   Clinical Narrative:     Notified by [name] with [facility] that patient now qualifes for the hospice home [name] has spoken with Moody Bruins and he is to sign consent paper work Updated brother [name] he is in agreement.   Notified Tanya at [facility]  [name] to arrange [facility], and provided Rn number to call report Signed DNR and EMS packet on chart    Barriers to Discharge: Continued Medical Work up   Patient Goals and CMS Choice            Discharge Placement                       Discharge Plan and Services Additional resources added to the After Visit Summary for       Post Acute Care Choice: NA                               Social Drivers of Health (SDOH) Interventions SDOH Screenings   Food Insecurity: Patient Unable To Answer (05/14/2023)  Housing: Patient Unable To Answer (05/14/2023)  Transportation Needs: Patient Unable To Answer (05/14/2023)  Utilities: Patient Unable To Answer (05/14/2023)  Financial Resource Strain: Low Risk  (10/31/2022)   Received from [facility]  Social Connections: Patient Unable To Answer (05/14/2023)  Tobacco Use: Medium Risk (05/14/2023)     Readmission Risk Interventions    05/15/2023   11:11 AM 02/06/2022    3:18 PM  Readmission Risk Prevention Plan  Transportation Screening  Complete  Medication Review (RN Care Manager)  Complete  PCP or Specialist appointment within 3-5 days of discharge Complete   SW Recovery Care/Counseling Consult Complete Complete  Palliative Care Screening Not Applicable Not Applicable  Skilled Nursing Facility Not Applicable

## 2023-05-21 NOTE — Progress Notes (Signed)
 Report given to Mindy at [facility].

## 2023-06-15 DIAGNOSIS — R32 Unspecified urinary incontinence: Secondary | ICD-10-CM | POA: Diagnosis not present

## 2023-06-18 DEATH — deceased
